# Patient Record
Sex: Male | Born: 1940 | Race: White | Hispanic: No | Marital: Married | Smoking: Never smoker
Health system: Southern US, Community
[De-identification: ages and names within clinical notes are randomized; demographics above are authoritative.]

## PROBLEM LIST (undated history)

## (undated) DIAGNOSIS — R42 Dizziness and giddiness: Secondary | ICD-10-CM

## (undated) DIAGNOSIS — E785 Hyperlipidemia, unspecified: Secondary | ICD-10-CM

## (undated) DIAGNOSIS — C801 Malignant (primary) neoplasm, unspecified: Secondary | ICD-10-CM

## (undated) DIAGNOSIS — R112 Nausea with vomiting, unspecified: Secondary | ICD-10-CM

## (undated) DIAGNOSIS — Z8489 Family history of other specified conditions: Secondary | ICD-10-CM

## (undated) DIAGNOSIS — Z9889 Other specified postprocedural states: Secondary | ICD-10-CM

## (undated) DIAGNOSIS — K649 Unspecified hemorrhoids: Secondary | ICD-10-CM

## (undated) DIAGNOSIS — E039 Hypothyroidism, unspecified: Secondary | ICD-10-CM

## (undated) DIAGNOSIS — E079 Disorder of thyroid, unspecified: Secondary | ICD-10-CM

## (undated) DIAGNOSIS — I10 Essential (primary) hypertension: Secondary | ICD-10-CM

## (undated) DIAGNOSIS — I2510 Atherosclerotic heart disease of native coronary artery without angina pectoris: Secondary | ICD-10-CM

## (undated) DIAGNOSIS — N189 Chronic kidney disease, unspecified: Secondary | ICD-10-CM

## (undated) DIAGNOSIS — K219 Gastro-esophageal reflux disease without esophagitis: Secondary | ICD-10-CM

## (undated) DIAGNOSIS — K5900 Constipation, unspecified: Secondary | ICD-10-CM

## (undated) DIAGNOSIS — K635 Polyp of colon: Secondary | ICD-10-CM

## (undated) DIAGNOSIS — I219 Acute myocardial infarction, unspecified: Secondary | ICD-10-CM

## (undated) HISTORY — PX: COLONOSCOPY: SHX174

## (undated) HISTORY — PX: OTHER SURGICAL HISTORY: SHX169

## (undated) HISTORY — DX: Acute myocardial infarction, unspecified: I21.9

## (undated) HISTORY — PX: UPPER GASTROINTESTINAL ENDOSCOPY: SHX188

## (undated) HISTORY — PX: HERNIA REPAIR: SHX51

## (undated) HISTORY — DX: Atherosclerotic heart disease of native coronary artery without angina pectoris: I25.10

## (undated) HISTORY — PX: APPENDECTOMY: SHX54

## (undated) HISTORY — DX: Hyperlipidemia, unspecified: E78.5

---

## 2006-05-20 ENCOUNTER — Ambulatory Visit: Payer: Self-pay | Admitting: Ophthalmology

## 2006-07-09 ENCOUNTER — Ambulatory Visit: Payer: Self-pay | Admitting: *Deleted

## 2006-08-14 ENCOUNTER — Ambulatory Visit: Payer: Self-pay | Admitting: Internal Medicine

## 2007-03-07 ENCOUNTER — Ambulatory Visit: Payer: Self-pay | Admitting: Unknown Physician Specialty

## 2009-11-15 ENCOUNTER — Ambulatory Visit: Payer: Self-pay | Admitting: Unknown Physician Specialty

## 2011-03-14 ENCOUNTER — Ambulatory Visit: Payer: Self-pay | Admitting: Urology

## 2011-06-01 ENCOUNTER — Other Ambulatory Visit: Payer: Self-pay | Admitting: Urology

## 2011-06-18 ENCOUNTER — Encounter (HOSPITAL_COMMUNITY): Payer: Self-pay | Admitting: Pharmacy Technician

## 2011-06-19 ENCOUNTER — Ambulatory Visit (HOSPITAL_COMMUNITY)
Admission: RE | Admit: 2011-06-19 | Discharge: 2011-06-19 | Disposition: A | Discharge status: Home / Self Care | Payer: Medicare Other | Source: Ambulatory Visit | Attending: Urology | Admitting: Urology

## 2011-06-19 ENCOUNTER — Encounter (HOSPITAL_COMMUNITY)
Admission: RE | Admit: 2011-06-19 | Discharge: 2011-06-19 | Disposition: A | Discharge status: Home / Self Care | Payer: Medicare Other | Source: Ambulatory Visit | Attending: Urology | Admitting: Urology

## 2011-06-19 ENCOUNTER — Other Ambulatory Visit: Payer: Self-pay

## 2011-06-19 ENCOUNTER — Encounter (HOSPITAL_COMMUNITY): Payer: Self-pay

## 2011-06-19 DIAGNOSIS — Z01818 Encounter for other preprocedural examination: Secondary | ICD-10-CM | POA: Insufficient documentation

## 2011-06-19 DIAGNOSIS — M47817 Spondylosis without myelopathy or radiculopathy, lumbosacral region: Secondary | ICD-10-CM | POA: Insufficient documentation

## 2011-06-19 DIAGNOSIS — Z01812 Encounter for preprocedural laboratory examination: Secondary | ICD-10-CM | POA: Insufficient documentation

## 2011-06-19 DIAGNOSIS — M47814 Spondylosis without myelopathy or radiculopathy, thoracic region: Secondary | ICD-10-CM | POA: Insufficient documentation

## 2011-06-19 DIAGNOSIS — E119 Type 2 diabetes mellitus without complications: Secondary | ICD-10-CM | POA: Insufficient documentation

## 2011-06-19 DIAGNOSIS — Z79899 Other long term (current) drug therapy: Secondary | ICD-10-CM | POA: Insufficient documentation

## 2011-06-19 DIAGNOSIS — I10 Essential (primary) hypertension: Secondary | ICD-10-CM | POA: Insufficient documentation

## 2011-06-19 DIAGNOSIS — C61 Malignant neoplasm of prostate: Secondary | ICD-10-CM | POA: Insufficient documentation

## 2011-06-19 HISTORY — DX: Essential (primary) hypertension: I10

## 2011-06-19 HISTORY — DX: Malignant (primary) neoplasm, unspecified: C80.1

## 2011-06-19 HISTORY — DX: Other specified postprocedural states: R11.2

## 2011-06-19 HISTORY — DX: Gastro-esophageal reflux disease without esophagitis: K21.9

## 2011-06-19 HISTORY — DX: Other specified postprocedural states: Z98.890

## 2011-06-19 LAB — BASIC METABOLIC PANEL
BUN: 21 mg/dL (ref 6–23)
CO2: 28 mEq/L (ref 19–32)
Chloride: 101 mEq/L (ref 96–112)
GFR calc non Af Amer: 48 mL/min — ABNORMAL LOW (ref >90)
Glucose, Bld: 211 mg/dL — ABNORMAL HIGH (ref 70–99)
Potassium: 4.6 mEq/L (ref 3.5–5.1)
Sodium: 134 mEq/L — ABNORMAL LOW (ref 135–145)

## 2011-06-19 LAB — CBC
HCT: 39.7 % (ref 39.0–52.0)
Hemoglobin: 13.6 g/dL (ref 13.0–17.0)
RBC: 4.52 MIL/uL (ref 4.22–5.81)
WBC: 6.5 10*3/uL (ref 4.0–10.5)

## 2011-06-19 LAB — SURGICAL PCR SCREEN
MRSA, PCR: NEGATIVE
Staphylococcus aureus: NEGATIVE

## 2011-06-19 NOTE — Patient Instructions (Addendum)
[facility]  06/19/2011   Your procedure is scheduled on:  06/25/11  Monday  Surgery 1100-1400  Report to [facility] at  0830     AM.  Call this number if you have problems the morning of surgery: [phone]     Or PST   [id number]  Karsyn Jamie             NO BLOOD SUGAR MEDS AM OF SURGERY  Remember:   Do not eat food:After Midnight.SATURDAY NIGHT AS PER OFFICE- BOWEL PREP as per office-    Increase fluids Sunday until midnight  May have clear liquids: CLEARS   Sunday AS PER OFFICE  Clear liquids include soda, tea, black coffee, apple or grape juice, broth.  Take these medicines the morning of surgery with A SIP OF WATER:   Prolisec with sip  water   Do not wear jewelry, make-up or nail polish.  Do not wear lotions, powders, or perfumes. You may wear deodorant.  Do not shave 48 hours prior to surgery.  Do not bring valuables to the hospital.  Contacts, dentures or bridgework may not be worn into surgery.  Leave suitcase in the car. After surgery it may be brought to your room.  For patients admitted to the hospital, checkout time is 11:00 AM the day of discharge.   Patients discharged the day of surgery will not be allowed to drive home.  Name and phone number of your driver:    Wife  Butch Penny                                                                  Special Instructions: CHG Shower Use Special Wash: 1/2 bottle night before surgery and 1/2 bottle morning of surgery. REGULAR SOAP FACE AND PRIVATES              L                MEN-MAY SHAVE FACE MORNING OF SURGERY  Please read over the following fact sheets that you were given: MRSA Information

## 2011-06-24 NOTE — H&P (Signed)
Chief Complaint  Prostate Cancer   Reason For Visit  Reason for visit: 2nd opinion about treatment options for prostate cancer PCP: Dr. [name]   History of Present Illness     Victor Gould is a 71 year old who was noted to have an elevated PSA of 6.66 which was found to still be elevated at 5.8 when rechecked which prompted a urologic evaluation by Dr. John Giovanni. He underwent a prostate biopsy on 03/01/11 which demonstrated Gleason 4+3=7 adenocarcinoma of the prostate with 9 out of 12 biopsy cores positive for malignancy. He underwent MR imaging of the pelvis on 03/14/11 which demonstrated no findings to suggest EPE, SVI, or LAD. His father was diagnosed with prostate cancer in his 93s and ultimately died of metastatic prostate cancer.  He has been thoroughly counseled about his options for management by Dr. Bernardo Heater and Dr. Jonette Eva.  He has also had a radiation oncology appointment with Dr. Ina Homes. He is very well informed about his treatment options and is here today for another opinion.  Notably, he underwent cystoscopy by Dr. Nevada Crane presumably for LUTS and he was noted to have a prominent median lobe.  While he was considering radiation therapy, concern regarding his LUTS and his median lobe caused him to reconsider his options for treatment.  TNM stage: cT2c Nx Mx PSA: 5.8 Gleason score: 4+3=7 Biopsy (03/01/11 - [facility], read by Dr. Elayne Guerin, Acc # [phone]): 9/12 cores     Left: L lateral apex (33%, 3+3=6), L apex (21%, 3+3=6), L mid (15%, 3+3=6), L lateral base (60%, 4+3=7)    Right: R lateral apex (71%, 3+3=6), R mid (30%, 3+3=6), R lateral mid (36%, 3+3=6), R base (5%, 3+3=6), R lateral base (42%, 3+3=6) Prostate volume: 31 cc  Nomogram: OC disease: 71% EPE: 23% SVI: 6% LNI: 2.4% PFS (surgery): 83%, 75%  Urinary function: He has moderate lower urinary tract symptoms including a sense of incomplete emptying, frequency, intermittency, urgency, weak stream, and  straining. IPSS: 17/4.  Erectile function:  He has severe erectile dysfunction which has not been responsive to PDE 5 inhibitors.   Past Medical History Problems  1. History of  Diabetes Mellitus 250.00 2. History of  Esophageal Reflux 530.81 3. History of  Hypertension 401.9 4. History of  Prostate Cancer V10.46  Surgical History Problems  1. History of  Appendectomy 2. History of  Hemorrhoidectomy 3. History of  Hernia Repair Bilateral   He has undergone bilateral open inguinal hernia repair.   Current Meds 1. Aspirin 81 MG Oral Tablet; Therapy: (Recorded:29Jan2013) to 2. Glimepiride 4 MG Oral Tablet; Therapy: 22Aug2012 to 3. Januvia 100 MG Oral Tablet; Therapy: 22Aug2012 to 4. Lisinopril 10 MG Oral Tablet; Therapy: 22Aug2012 to 5. MetFORMIN HCl 500 MG Oral Tablet; Therapy: 22Aug2012 to 6. Multivitamins TABS; Therapy: (Recorded:29Jan2013) to 7. Omeprazole 20 MG Oral Capsule Delayed Release; Therapy: 06Dec2011 to 8. Tamsulosin HCl 0.4 MG Oral Capsule; Therapy: [id number] to  Allergies Medication  1. Sulfa Drugs  Family History Problems  1. Family history of  Acute Myocardial Infarction V17.3 2. Family history of  Cancer 3. Family history of  Family Health Status Number Of Children 3 living children 4. Family history of  Heart Disease V17.49 5. Family history of  Prostate Cancer V16.42  Social History Problems    Marital History - Currently Married   Never A Smoker   Occupation: Retired Denied    History of  Alcohol Use  Review of Systems Constitutional, skin, eye,  otolaryngeal, hematologic/lymphatic, cardiovascular, pulmonary, endocrine, musculoskeletal, gastrointestinal, neurological and psychiatric system(s) were reviewed and pertinent findings if present are noted.  Gastrointestinal: heartburn.  Integumentary: skin rash/lesion and pruritus.  Neurological: dizziness.    Vitals Vital Signs [Data Includes: Last 1 Day]  29Jan2013 04:43PM  BMI Calculated:  24.34 BSA Calculated: 1.95 Height: 5 ft 10 in Weight: 170 lb  Blood Pressure: 131 / 76 Heart Rate: 85  Physical Exam Constitutional: Well nourished and well developed . No acute distress.  ENT:. The ears and nose are normal in appearance.  Neck: The appearance of the neck is normal and no neck mass is present.  Pulmonary: No respiratory distress, normal respiratory rhythm and effort and clear bilateral breath sounds.  Cardiovascular: Heart rate and rhythm are normal . No peripheral edema.  Abdomen: right lower quadrant, left inguinal, right inguinal incision site(s) well healed. The abdomen is soft and nontender. No masses are palpated. No CVA tenderness. No hernias are palpable. No hepatosplenomegaly noted.  Rectal: His prostate is estimated to be approximately 35 cc. He does have concern induration involving the right and left apices.  Lymphatics: The femoral and inguinal nodes are not enlarged or tender.  Skin: Normal skin turgor, no visible rash and no visible skin lesions.  Neuro/Psych:. Mood and affect are appropriate.    Results/Data  I have independently reviewed his medical records, pathology report, PSA results, and reviewed the report of his MRI of the prostate. Findings are as dictated above.     Assessment Assessed  1. Prostate Cancer 185  Plan Prostate Cancer (185)  1. Follow-up Office  Follow-up  Done: 29Jan2013 2. PT Referral Referral  Referral  Requested for: 29Jan2013  Discussion/Summary  1. Prostate cancer:   The patient was counseled about the natural history of prostate cancer and the standard treatment options that are available for prostate cancer. It was explained to him how his age and life expectancy, clinical stage, Gleason score, and PSA affect his prognosis, the decision to proceed with additional staging studies, as well as how that information influences recommended treatment strategies. We discussed the roles for active surveillance, radiation  therapy, surgical therapy, androgen deprivation, as well as ablative therapy options for the treatment of prostate cancer as appropriate to his individual cancer situation. We discussed the risks and benefits of these options with regard to their impact on cancer control and also in terms of potential adverse events, complications, and impact on quiality of life particularly related to urinary, bowel, and sexual function. The patient was encouraged to ask questions throughout the discussion today and all questions were answered to his stated satisfaction. In addition, the patient was provided with and/or directed to appropriate resources and literature for further education about prostate cancer and treatment options.   We discussed surgical therapy for prostate cancer including the different available surgical approaches. We discussed, in detail, the risks and expectations of surgery with regard to cancer control, urinary control, and erectile function as well as the expected postoperative recovery process. Additional risks of surgery including but not limited to bleeding, infection, hernia formation, nerve damage, lymphocele formation, bowel/rectal injury potentially necessitating colostomy, damage to the urinary tract resulting in urine leakage, urethral stricture, and the cardiopulmonary risks such as myocardial infarction, stroke, death, venothromboembolism, etc. were explained. The risk of open surgical conversion for robotic/laparoscopic prostatectomy was also discussed.   I had a long discussion with Victor Gould and his wife regarding his situation and his options for management. He definitely does not  wish to proceed with surveillance considering his concern about cancer progression and his own personal experience with his father's death related to metastatic prostate cancer. We then discussed options and specifically reviewed his lower urinary tract symptoms and median prostate lobe. We discussed  options including radical prostatectomy and the option of proceeding with a TUR of his median lobe subsequenty followed by radiation therapy to the prostate. He understands the increased risk of urinary incontinence associated with surgery considering his age which may be as high as 10% for long-term severe incontinence. He understands this risk and is very well-informed. He does wish to proceed with surgical treatment at this time. He will be scheduled for a non-nerve sparing robotic prostatectomy and pelvic lymphadenectomy.  Cc: Dr. [name]    SignaturesElectronically signed by : Raynelle Bring, M.D.; May 29 2011  8:27PM

## 2011-06-25 ENCOUNTER — Other Ambulatory Visit: Payer: Self-pay | Admitting: Urology

## 2011-06-25 ENCOUNTER — Inpatient Hospital Stay (HOSPITAL_COMMUNITY)
Admission: RE | Admit: 2011-06-25 | Discharge: 2011-06-26 | DRG: 708 | Disposition: A | Discharge status: Home / Self Care | Payer: Medicare Other | Source: Ambulatory Visit | Attending: Urology | Admitting: Urology

## 2011-06-25 ENCOUNTER — Inpatient Hospital Stay (HOSPITAL_COMMUNITY): Payer: Medicare Other | Admitting: Certified Registered Nurse Anesthetist

## 2011-06-25 ENCOUNTER — Encounter (HOSPITAL_COMMUNITY): Payer: Self-pay | Admitting: *Deleted

## 2011-06-25 ENCOUNTER — Encounter (HOSPITAL_COMMUNITY): Payer: Self-pay | Admitting: Certified Registered Nurse Anesthetist

## 2011-06-25 ENCOUNTER — Encounter (HOSPITAL_COMMUNITY)
Admission: RE | Disposition: A | Discharge status: Home / Self Care | Payer: Self-pay | Source: Ambulatory Visit | Attending: Urology

## 2011-06-25 DIAGNOSIS — C61 Malignant neoplasm of prostate: Principal | ICD-10-CM | POA: Diagnosis present

## 2011-06-25 DIAGNOSIS — E119 Type 2 diabetes mellitus without complications: Secondary | ICD-10-CM | POA: Diagnosis present

## 2011-06-25 DIAGNOSIS — I10 Essential (primary) hypertension: Secondary | ICD-10-CM | POA: Diagnosis present

## 2011-06-25 HISTORY — PX: ROBOT ASSISTED LAPAROSCOPIC RADICAL PROSTATECTOMY: SHX5141

## 2011-06-25 LAB — GLUCOSE, CAPILLARY
Glucose-Capillary: 247 mg/dL — ABNORMAL HIGH (ref 70–99)
Glucose-Capillary: 200 mg/dL — ABNORMAL HIGH (ref 70–99)
Glucose-Capillary: 249 mg/dL — ABNORMAL HIGH (ref 70–99)

## 2011-06-25 LAB — TYPE AND SCREEN: Antibody Screen: NEGATIVE

## 2011-06-25 LAB — HEMOGLOBIN AND HEMATOCRIT, BLOOD: HCT: 40 % (ref 39.0–52.0)

## 2011-06-25 SURGERY — ROBOTIC ASSISTED LAPAROSCOPIC RADICAL PROSTATECTOMY LEVEL 2
Anesthesia: General | Site: Pelvis | Wound class: Clean

## 2011-06-25 MED ORDER — BUPIVACAINE-EPINEPHRINE PF 0.25-1:200000 % IJ SOLN
INTRAMUSCULAR | Status: AC
  Filled 2011-06-25: qty 30

## 2011-06-25 MED ORDER — ROCURONIUM BROMIDE 100 MG/10ML IV SOLN
INTRAVENOUS | Status: DC | PRN
  Administered 2011-06-25: 50 mg via INTRAVENOUS
  Administered 2011-06-25: 20 mg via INTRAVENOUS

## 2011-06-25 MED ORDER — BUPIVACAINE-EPINEPHRINE 0.25% -1:200000 IJ SOLN
INTRAMUSCULAR | Status: DC | PRN
  Administered 2011-06-25: 26 mL

## 2011-06-25 MED ORDER — ACETAMINOPHEN 10 MG/ML IV SOLN
INTRAVENOUS | Status: DC | PRN
  Administered 2011-06-25: 1000 mg via INTRAVENOUS

## 2011-06-25 MED ORDER — HYDROMORPHONE HCL PF 1 MG/ML IJ SOLN
INTRAMUSCULAR | Status: AC
  Filled 2011-06-25: qty 1

## 2011-06-25 MED ORDER — KCL IN DEXTROSE-NACL 20-5-0.45 MEQ/L-%-% IV SOLN
INTRAVENOUS | Status: DC
  Administered 2011-06-25: 21:00:00 via INTRAVENOUS
  Administered 2011-06-25: 1000 mL via INTRAVENOUS
  Filled 2011-06-25 (×5): qty 1000

## 2011-06-25 MED ORDER — PANTOPRAZOLE SODIUM 40 MG PO TBEC
40.0000 mg | DELAYED_RELEASE_TABLET | Freq: Every day | ORAL | Status: DC
  Administered 2011-06-26: 40 mg via ORAL
  Filled 2011-06-25: qty 1

## 2011-06-25 MED ORDER — SODIUM CHLORIDE 0.9 % IR SOLN
Status: DC | PRN
  Administered 2011-06-25: 1000 mL

## 2011-06-25 MED ORDER — LIDOCAINE HCL (CARDIAC) 20 MG/ML IV SOLN
INTRAVENOUS | Status: DC | PRN
  Administered 2011-06-25: 100 mg via INTRAVENOUS

## 2011-06-25 MED ORDER — HEPARIN SODIUM (PORCINE) 1000 UNIT/ML IJ SOLN
INTRAMUSCULAR | Status: AC
  Filled 2011-06-25: qty 1

## 2011-06-25 MED ORDER — FENTANYL CITRATE 0.05 MG/ML IJ SOLN
INTRAMUSCULAR | Status: DC | PRN
  Administered 2011-06-25 (×2): 50 ug via INTRAVENOUS
  Administered 2011-06-25: 100 ug via INTRAVENOUS
  Administered 2011-06-25 (×3): 50 ug via INTRAVENOUS

## 2011-06-25 MED ORDER — INSULIN ASPART 100 UNIT/ML ~~LOC~~ SOLN
SUBCUTANEOUS | Status: AC
  Filled 2011-06-25: qty 1

## 2011-06-25 MED ORDER — NEOSTIGMINE METHYLSULFATE 1 MG/ML IJ SOLN
INTRAMUSCULAR | Status: DC | PRN
  Administered 2011-06-25: 5 mg via INTRAVENOUS

## 2011-06-25 MED ORDER — INSULIN ASPART 100 UNIT/ML ~~LOC~~ SOLN
0.0000 [IU] | SUBCUTANEOUS | Status: DC
  Administered 2011-06-25: 5 [IU] via SUBCUTANEOUS
  Administered 2011-06-25: 3 [IU] via SUBCUTANEOUS

## 2011-06-25 MED ORDER — HYDROCODONE-ACETAMINOPHEN 5-325 MG PO TABS: 1.0000 | ORAL_TABLET | Freq: Four times a day (QID) | ORAL | Status: AC | PRN

## 2011-06-25 MED ORDER — INDIGOTINDISULFONATE SODIUM 8 MG/ML IJ SOLN
INTRAMUSCULAR | Status: DC | PRN
  Administered 2011-06-25 (×2): 5 mL via INTRAVENOUS

## 2011-06-25 MED ORDER — INSULIN ASPART 100 UNIT/ML ~~LOC~~ SOLN: 0.0000 [IU] | SUBCUTANEOUS | Status: DC

## 2011-06-25 MED ORDER — LACTATED RINGERS IV SOLN
INTRAVENOUS | Status: DC | PRN
  Administered 2011-06-25: 11:00:00

## 2011-06-25 MED ORDER — MIDAZOLAM HCL 5 MG/5ML IJ SOLN
INTRAMUSCULAR | Status: DC | PRN
  Administered 2011-06-25: 2 mg via INTRAVENOUS

## 2011-06-25 MED ORDER — PROMETHAZINE HCL 25 MG/ML IJ SOLN: 6.2500 mg | INTRAMUSCULAR | Status: DC | PRN

## 2011-06-25 MED ORDER — CIPROFLOXACIN HCL 500 MG PO TABS: 500.0000 mg | ORAL_TABLET | Freq: Two times a day (BID) | ORAL | Status: AC

## 2011-06-25 MED ORDER — KETOROLAC TROMETHAMINE 15 MG/ML IJ SOLN
15.0000 mg | Freq: Four times a day (QID) | INTRAMUSCULAR | Status: DC
  Administered 2011-06-25 – 2011-06-26 (×5): 15 mg via INTRAVENOUS
  Filled 2011-06-25 (×5): qty 1

## 2011-06-25 MED ORDER — SUCCINYLCHOLINE CHLORIDE 20 MG/ML IJ SOLN
INTRAMUSCULAR | Status: DC | PRN
  Administered 2011-06-25: 100 mg via INTRAVENOUS

## 2011-06-25 MED ORDER — KETOROLAC TROMETHAMINE 15 MG/ML IJ SOLN
INTRAMUSCULAR | Status: AC
  Filled 2011-06-25: qty 1

## 2011-06-25 MED ORDER — DROPERIDOL 2.5 MG/ML IJ SOLN
INTRAMUSCULAR | Status: DC | PRN
  Administered 2011-06-25: 0.625 mg via INTRAVENOUS

## 2011-06-25 MED ORDER — DIPHENHYDRAMINE HCL 50 MG/ML IJ SOLN: 12.5000 mg | Freq: Four times a day (QID) | INTRAMUSCULAR | Status: DC | PRN

## 2011-06-25 MED ORDER — CEFAZOLIN SODIUM 1-5 GM-% IV SOLN
1.0000 g | Freq: Three times a day (TID) | INTRAVENOUS | Status: AC
  Administered 2011-06-25 – 2011-06-26 (×2): 1 g via INTRAVENOUS
  Filled 2011-06-25 (×3): qty 50

## 2011-06-25 MED ORDER — INSULIN ASPART 100 UNIT/ML ~~LOC~~ SOLN
0.0000 [IU] | SUBCUTANEOUS | Status: DC
  Administered 2011-06-25: 8 [IU] via SUBCUTANEOUS
  Administered 2011-06-25: 5 [IU] via SUBCUTANEOUS
  Administered 2011-06-26 (×2): 8 [IU] via SUBCUTANEOUS
  Administered 2011-06-26: 11 [IU] via SUBCUTANEOUS
  Administered 2011-06-26: 8 [IU] via SUBCUTANEOUS
  Filled 2011-06-25 (×2): qty 3

## 2011-06-25 MED ORDER — EPHEDRINE SULFATE 50 MG/ML IJ SOLN
INTRAMUSCULAR | Status: DC | PRN
  Administered 2011-06-25: 10 mg via INTRAVENOUS

## 2011-06-25 MED ORDER — CEFAZOLIN SODIUM 1-5 GM-% IV SOLN
1.0000 g | INTRAVENOUS | Status: AC
  Administered 2011-06-25: 1 g via INTRAVENOUS

## 2011-06-25 MED ORDER — SODIUM CHLORIDE 0.9 % IV BOLUS (SEPSIS)
1000.0000 mL | Freq: Once | INTRAVENOUS | Status: AC
  Administered 2011-06-25: 1000 mL via INTRAVENOUS

## 2011-06-25 MED ORDER — DOCUSATE SODIUM 100 MG PO CAPS
100.0000 mg | ORAL_CAPSULE | Freq: Two times a day (BID) | ORAL | Status: DC
  Administered 2011-06-25 – 2011-06-26 (×2): 100 mg via ORAL
  Filled 2011-06-25 (×3): qty 1

## 2011-06-25 MED ORDER — HYDROMORPHONE HCL PF 1 MG/ML IJ SOLN
INTRAMUSCULAR | Status: DC | PRN
  Administered 2011-06-25 (×2): 0.5 mg via INTRAVENOUS

## 2011-06-25 MED ORDER — INDIGOTINDISULFONATE SODIUM 8 MG/ML IJ SOLN
INTRAMUSCULAR | Status: AC
  Filled 2011-06-25: qty 10

## 2011-06-25 MED ORDER — HYDROMORPHONE HCL PF 1 MG/ML IJ SOLN
0.2500 mg | INTRAMUSCULAR | Status: DC | PRN
Start: 2011-06-25 — End: 2011-06-25
  Administered 2011-06-25 (×2): 0.5 mg via INTRAVENOUS

## 2011-06-25 MED ORDER — KCL IN DEXTROSE-NACL 20-5-0.45 MEQ/L-%-% IV SOLN
INTRAVENOUS | Status: AC
  Filled 2011-06-25: qty 1000

## 2011-06-25 MED ORDER — SCOPOLAMINE 1 MG/3DAYS TD PT72
MEDICATED_PATCH | TRANSDERMAL | Status: DC | PRN
  Administered 2011-06-25: 1 via TRANSDERMAL

## 2011-06-25 MED ORDER — DIPHENHYDRAMINE HCL 12.5 MG/5ML PO ELIX: 12.5000 mg | ORAL_SOLUTION | Freq: Four times a day (QID) | ORAL | Status: DC | PRN

## 2011-06-25 MED ORDER — PROPOFOL 10 MG/ML IV EMUL
INTRAVENOUS | Status: DC | PRN
  Administered 2011-06-25: 150 mg via INTRAVENOUS

## 2011-06-25 MED ORDER — GLYCOPYRROLATE 0.2 MG/ML IJ SOLN
INTRAMUSCULAR | Status: DC | PRN
  Administered 2011-06-25: .8 mg via INTRAVENOUS

## 2011-06-25 MED ORDER — ACETAMINOPHEN 325 MG PO TABS: 650.0000 mg | ORAL_TABLET | ORAL | Status: DC | PRN

## 2011-06-25 MED ORDER — MORPHINE SULFATE 2 MG/ML IJ SOLN: 2.0000 mg | INTRAMUSCULAR | Status: DC | PRN

## 2011-06-25 MED ORDER — LACTATED RINGERS IV SOLN
INTRAVENOUS | Status: DC
  Administered 2011-06-25: 1000 mL via INTRAVENOUS

## 2011-06-25 MED ORDER — SCOPOLAMINE 1 MG/3DAYS TD PT72
MEDICATED_PATCH | TRANSDERMAL | Status: AC
  Filled 2011-06-25: qty 1

## 2011-06-25 MED ORDER — ONDANSETRON HCL 4 MG/2ML IJ SOLN
INTRAMUSCULAR | Status: DC | PRN
  Administered 2011-06-25: 4 mg via INTRAVENOUS

## 2011-06-25 SURGICAL SUPPLY — 35 items
CANISTER SUCTION 2500CC (MISCELLANEOUS) ×3 IMPLANT
CATH ROBINSON RED A/P 8FR (CATHETERS) ×3 IMPLANT
CHLORAPREP W/TINT 26ML (MISCELLANEOUS) ×3 IMPLANT
CLIP LIGATING HEM O LOK PURPLE (MISCELLANEOUS) ×6 IMPLANT
CLOTH BEACON ORANGE TIMEOUT ST (SAFETY) ×3 IMPLANT
CORD HIGH FREQUENCY UNIPOLAR (ELECTROSURGICAL) ×3 IMPLANT
COVER SURGICAL LIGHT HANDLE (MISCELLANEOUS) ×3 IMPLANT
COVER TIP SHEARS 8 DVNC (MISCELLANEOUS) ×2 IMPLANT
COVER TIP SHEARS 8MM DA VINCI (MISCELLANEOUS) ×1
CUTTER ECHEON FLEX ENDO 45 340 (ENDOMECHANICALS) ×3 IMPLANT
DECANTER SPIKE VIAL GLASS SM (MISCELLANEOUS) ×3 IMPLANT
DRAPE SURG IRRIG POUCH 19X23 (DRAPES) ×3 IMPLANT
DRSG TEGADERM 6X8 (GAUZE/BANDAGES/DRESSINGS) ×6 IMPLANT
ELECT REM PT RETURN 9FT ADLT (ELECTROSURGICAL) ×3
ELECTRODE REM PT RTRN 9FT ADLT (ELECTROSURGICAL) ×2 IMPLANT
GLOVE BIO SURGEON STRL SZ 6.5 (GLOVE) ×3 IMPLANT
GLOVE BIOGEL M STRL SZ7.5 (GLOVE) ×6 IMPLANT
GOWN STRL NON-REIN LRG LVL3 (GOWN DISPOSABLE) ×9 IMPLANT
GOWN STRL REIN XL XLG (GOWN DISPOSABLE) ×6 IMPLANT
HOLDER FOLEY CATH W/STRAP (MISCELLANEOUS) ×3 IMPLANT
IV LACTATED RINGERS 1000ML (IV SOLUTION) ×3 IMPLANT
KIT ACCESSORY DA VINCI DISP (KITS) ×1
KIT ACCESSORY DVNC DISP (KITS) ×2 IMPLANT
NDL SAFETY ECLIPSE 18X1.5 (NEEDLE) ×2 IMPLANT
NEEDLE HYPO 18GX1.5 SHARP (NEEDLE) ×1
PACK ROBOT UROLOGY CUSTOM (CUSTOM PROCEDURE TRAY) ×3 IMPLANT
RELOAD GREEN ECHELON 45 (STAPLE) ×3 IMPLANT
SET TUBE IRRIG SUCTION NO TIP (IRRIGATION / IRRIGATOR) ×3 IMPLANT
SOLUTION ELECTROLUBE (MISCELLANEOUS) ×3 IMPLANT
SPONGE GAUZE 4X4 12PLY (GAUZE/BANDAGES/DRESSINGS) ×3 IMPLANT
SUT VICRYL 0 UR6 27IN ABS (SUTURE) ×6 IMPLANT
SYR 27GX1/2 1ML LL SAFETY (SYRINGE) ×3 IMPLANT
TOWEL OR 17X26 10 PK STRL BLUE (TOWEL DISPOSABLE) ×3 IMPLANT
TOWEL OR NON WOVEN STRL DISP B (DISPOSABLE) ×3 IMPLANT
WATER STERILE IRR 1500ML POUR (IV SOLUTION) ×6 IMPLANT

## 2011-06-25 NOTE — Anesthesia Preprocedure Evaluation (Signed)
Anesthesia Evaluation  Patient identified by MRN, date of birth, ID band Patient awake    Reviewed: Allergy & Precautions, H&P , NPO status , Patient's Chart, lab work & pertinent test results, reviewed documented beta blocker date and time   History of Anesthesia Complications (+) PONV  Airway Mallampati: II TM Distance: >3 FB Neck ROM: Full    Dental  (+) Teeth Intact and Dental Advisory Given   Pulmonary neg pulmonary ROS,  clear to auscultation        Cardiovascular hypertension, Pt. on medications Regular Normal Denies cardiac symptoms   Neuro/Psych Negative Neurological ROS  Negative Psych ROS   GI/Hepatic negative GI ROS, Neg liver ROS,   Endo/Other  Diabetes mellitus-, Type 2, Oral Hypoglycemic AgentsSSI peri-op  Renal/GU Cr 1.41   Prostate cancer    Musculoskeletal negative musculoskeletal ROS (+)   Abdominal   Peds negative pediatric ROS (+)  Hematology negative hematology ROS (+)   Anesthesia Other Findings Upper rt incisor cap  Reproductive/Obstetrics negative OB ROS                           Anesthesia Physical Anesthesia Plan  ASA: III  Anesthesia Plan: General   Post-op Pain Management:    Induction: Intravenous  Airway Management Planned: Oral ETT  Additional Equipment:   Intra-op Plan:   Post-operative Plan: Extubation in OR  Informed Consent: I have reviewed the patients History and Physical, chart, labs and discussed the procedure including the risks, benefits and alternatives for the proposed anesthesia with the patient or authorized representative who has indicated his/her understanding and acceptance.   Dental advisory given  Plan Discussed with: CRNA and Surgeon  Anesthesia Plan Comments:         Anesthesia Quick Evaluation

## 2011-06-25 NOTE — Op Note (Signed)
Preoperative diagnosis: Clinically localized adenocarcinoma of the prostate (clinical stage cT2c N0 Mx)  Postoperative diagnosis: Clinically localized adenocarcinoma of the prostate (clinical stage cT2c N0 Mx)  Procedure:  1. Robotic assisted laparoscopic radical prostatectomy (non nerve sparing) 2. Bilateral robotic assisted laparoscopic pelvic lymphadenectomy  Surgeon: Pryor Curia. M.D.  Assistant(s): Leta Baptist, PA-C  Anesthesia: General  Complications: None  EBL: 50 mL  IVF:  1400 mL crystalloid  Specimens: 1. Prostate and seminal vesicles 2. Right pelvic lymph nodes 3. Left pelvic lymph nodes  Disposition of specimens: Pathology  Drains: 1. 20 Fr coude catheter 2. # 19 Blake pelvic drain  Indication: Victor Gould is a 71 y.o. year old patient with clinically localized prostate cancer.  After a thorough review of the management options for treatment of prostate cancer, he elected to proceed with surgical therapy and the above procedure(s).  We have discussed the potential benefits and risks of the procedure, side effects of the proposed treatment, the likelihood of the patient achieving the goals of the procedure, and any potential problems that might occur during the procedure or recuperation. Informed consent has been obtained.  Description of procedure:  The patient was taken to the operating room and a general anesthetic was administered. He was given preoperative antibiotics, placed in the dorsal lithotomy position, and prepped and draped in the usual sterile fashion. Next a preoperative timeout was performed. A urethral catheter was placed into the bladder and a site was selected near the umbilicus for placement of the camera port. This was placed using a standard open Hassan technique which allowed entry into the peritoneal cavity under direct vision and without difficulty. A 12 mm port was placed and a pneumoperitoneum established. The camera was then  used to inspect the abdomen and there was no evidence of any intra-abdominal injuries or other abnormalities. The remaining abdominal ports were then placed. 8 mm robotic ports were placed in the right lower quadrant, left lower quadrant, and far left lateral abdominal wall. A 5 mm port was placed in the right upper quadrant and a 12 mm port was placed in the right lateral abdominal wall for laparoscopic assistance. All ports were placed under direct vision without difficulty. The surgical cart was then docked.   Utilizing the cautery scissors, the bladder was reflected posteriorly allowing entry into the space of Retzius and identification of the endopelvic fascia and prostate. The periprostatic fat was then removed from the prostate allowing full exposure of the endopelvic fascia. The endopelvic fascia was then incised from the apex back to the base of the prostate bilaterally and the underlying levator muscle fibers were swept laterally off the prostate thereby isolating the dorsal venous complex. The dorsal vein was then stapled and divided with a 45 mm Flex Echelon stapler. Attention then turned to the bladder neck which was divided anteriorly thereby allowing entry into the bladder and exposure of the urethral catheter. The catheter balloon was deflated and the catheter was brought into the operative field and used to retract the prostate anteriorly. The posterior bladder neck was then examined and was divided allowing further dissection between the bladder and prostate posteriorly until the vasa deferentia and seminal vessels were identified. The vasa deferentia were isolated, divided, and lifted anteriorly. The seminal vesicles were dissected down to their tips with care to control the seminal vascular arterial blood supply. These structures were then lifted anteriorly and the space between Denonvillier's fascia and the anterior rectum was developed with a combination of  sharp and blunt dissection. This  isolated the vascular pedicles of the prostate.  A wide non nerve sparing dissection was performed with Weck clips used to ligate the vascular pedicles of the prostate bilaterally. The vascular pedicles of the prostate were then divided.  The urethra was then sharply transected allowing the prostate specimen to be disarticulated. The pelvis was copiously irrigated and hemostasis was ensured. There was no evidence for rectal injury.  Attention then turned to the right pelvic sidewall. The fibrofatty tissue between the external iliac vein, confluence of the iliac vessels, hypogastric artery, and Cooper's ligament was dissected free from the pelvic sidewall with care to preserve the obturator nerve. Weck clips were used for lymphostasis and hemostasis. An identical procedure was performed on the contralateral side and the lymphatic packets were removed for permanent pathologic analysis.  Attention then turned to the urethral anastomosis. A 2-0 Vicryl slip knot was placed between Denonvillier's fascia, the posterior bladder neck, and the posterior urethra to reapproximate these structures. A double-armed 3-0 Monocryl suture was then used to perform a 360 running tension-free anastomosis between the bladder neck and urethra. A new urethral catheter was then placed into the bladder and irrigated. There were no blood clots within the bladder and the anastomosis appeared to be watertight. A #19 Blake drain was then brought through the left lateral 8 mm port site and positioned appropriately within the pelvis. It was secured to the skin with a nylon suture. The surgical cart was then undocked. The right lateral 12 mm port site was closed at the fascial level with a 0 Vicryl suture placed laparoscopically. All remaining ports were then removed under direct vision. The prostate specimen was removed intact within the Endopouch retrieval bag via the periumbilical camera port site. This fascial opening was closed with  two running 0 Vicryl sutures. 0.25% Marcaine was then injected into all port sites and all incisions were reapproximated at the skin level with staples. Sterile dressings were applied. The patient appeared to tolerate the procedure well and without complications. The patient was able to be extubated and transferred to the recovery unit in satisfactory condition.  Pryor Curia MD

## 2011-06-25 NOTE — Progress Notes (Signed)
Ambulated patient ~40 feet.  Pt. Felt slightly nauseous and dizzy once he got up.  Patient tolerated fair.  Encouraged patient to ambulate again tonight before going to bed and to try and walk a little farther each time he ambulates as long as he isn't feeling nauseous and dizzy.  Patient agreed. Jalayla Chrismer, Dudley Major

## 2011-06-25 NOTE — Anesthesia Postprocedure Evaluation (Signed)
  Anesthesia Post-op Note  Patient: Victor Gould  Procedure(s) Performed: Procedure(s) (LRB): ROBOTIC ASSISTED LAPAROSCOPIC RADICAL PROSTATECTOMY LEVEL 2 (N/A) LYMPHADENECTOMY (Bilateral)  Patient Location: PACU  Anesthesia Type: General  Level of Consciousness: oriented and sedated  Airway and Oxygen Therapy: Patient Spontanous Breathing and Patient connected to nasal cannula oxygen  Post-op Pain: mild  Post-op Assessment: Post-op Vital signs reviewed, Patient's Cardiovascular Status Stable, Respiratory Function Stable and Patent Airway  Post-op Vital Signs: stable  Complications: No apparent anesthesia complications

## 2011-06-25 NOTE — Transfer of Care (Signed)
Immediate Anesthesia Transfer of Care Note  Patient: Victor Gould  Procedure(s) Performed: Procedure(s) (LRB): ROBOTIC ASSISTED LAPAROSCOPIC RADICAL PROSTATECTOMY LEVEL 2 (N/A) LYMPHADENECTOMY (Bilateral)  Patient Location: PACU  Anesthesia Type: General  Level of Consciousness: sedated, patient cooperative and responds to stimulaton  Airway & Oxygen Therapy: Patient Spontanous Breathing and Patient connected to face mask oxgen  Post-op Assessment: Report given to PACU RN and Post -op Vital signs reviewed and stable  Post vital signs: Reviewed and stable  Complications: No apparent anesthesia complications

## 2011-06-25 NOTE — Interval H&P Note (Signed)
History and Physical Interval Note:  06/25/2011 9:43 AM  Victor Gould  has presented today for surgery, with the diagnosis of Prostate Cancer  The various methods of treatment have been discussed with the patient and family. After consideration of risks, benefits and other options for treatment, the patient has consented to  Procedure(s) (LRB): ROBOTIC ASSISTED LAPAROSCOPIC RADICAL PROSTATECTOMY LEVEL 2 (N/A) LYMPHADENECTOMY (Bilateral) as a surgical intervention .  The patients' history has been reviewed, patient examined, no change in status, stable for surgery.  Questions were answered to the patient's satisfaction.     Nickia Boesen,LES

## 2011-06-26 LAB — GLUCOSE, CAPILLARY
Glucose-Capillary: 263 mg/dL — ABNORMAL HIGH (ref 70–99)
Glucose-Capillary: 262 mg/dL — ABNORMAL HIGH (ref 70–99)

## 2011-06-26 LAB — HEMOGLOBIN AND HEMATOCRIT, BLOOD
HCT: 36.8 % — ABNORMAL LOW (ref 39.0–52.0)
Hemoglobin: 12.9 g/dL — ABNORMAL LOW (ref 13.0–17.0)

## 2011-06-26 MED ORDER — HYDROCODONE-ACETAMINOPHEN 5-325 MG PO TABS
1.0000 | ORAL_TABLET | Freq: Four times a day (QID) | ORAL | Status: DC | PRN
  Administered 2011-06-26: 2 via ORAL
  Filled 2011-06-26: qty 2

## 2011-06-26 MED ORDER — BISACODYL 10 MG RE SUPP
10.0000 mg | Freq: Once | RECTAL | Status: AC
  Administered 2011-06-26: 10 mg via RECTAL
  Filled 2011-06-26: qty 1

## 2011-06-26 MED ORDER — MENTHOL 3 MG MT LOZG
1.0000 | LOZENGE | OROMUCOSAL | Status: DC | PRN
  Administered 2011-06-26: 3 mg via ORAL
  Filled 2011-06-26: qty 9

## 2011-06-26 NOTE — Progress Notes (Signed)
Patient ID: Victor Gould, male   DOB: 1940/08/17, 71 y.o.   MRN: [id number]  1 Day Post-Op Subjective: The patient is doing well.  No nausea or vomiting. Pain is adequately controlled.  Objective: Vital signs in last 24 hours: Temp:  [97.2 F (36.2 C)-99.1 F (37.3 C)] 98.8 F (37.1 C) (02/26 0459) Pulse Rate:  [81-108] 94  (02/26 0459) Resp:  [12-19] 16  (02/26 0459) BP: (144-182)/(71-92) 157/77 mmHg (02/26 0459) SpO2:  [95 %-100 %] 95 % (02/26 0459) Weight:  [77.248 kg (170 lb 4.8 oz)] 77.248 kg (170 lb 4.8 oz) (02/25 1630)  Intake/Output from previous day: 02/25 0701 - 02/26 0700 In: 5475 [I.V.:4375; IV Piggyback:1100] Out: [id number] [Urine:1600; Drains:135; Blood:50] Intake/Output this shift:    Physical Exam:  General: Alert and oriented. CV: RRR Lungs: Clear bilaterally. GI: Soft, Nondistended. Incisions: Dressings intact. Urine: Clear Extremities: Nontender, no erythema, no edema.  Lab Results:  Basename 06/26/11 0429 06/25/11 1321  HGB 12.9* 14.0  HCT 36.8* 40.0      Assessment/Plan: POD# 1 s/p robotic prostatectomy.  1) SL IVF 2) Ambulate, Incentive spirometry 3) Transition to oral pain medication 4) Dulcolax suppository 5) D/C pelvic drain 6) Plan for likely discharge later today   Pryor Curia. MD   LOS: 1 day   Victor Gould,Victor Gould 06/26/2011, 7:48 AM

## 2011-06-26 NOTE — Discharge Summary (Signed)
  Date of admission: 06/25/2011  Date of discharge: 06/26/2011  Admission diagnosis: Prostate Cancer  Discharge diagnosis: Prostate Cancer  History and Physical: For full details, please see admission history and physical. Briefly, Victor Gould is a 71 y.o. gentleman with localized prostate cancer.  After discussing management/treatment options, he elected to proceed with surgical treatment.  Hospital Course: Victor Gould was taken to the operating room on 06/25/2011 and underwent a robotic assisted laparoscopic radical prostatectomy. He tolerated this procedure well and without complications. Postoperatively, he was able to be transferred to a regular hospital room following recovery from anesthesia.  He was able to begin ambulating the night of surgery. He remained hemodynamically stable overnight.  He had excellent urine output with appropriately minimal output from his pelvic drain and his pelvic drain was removed on POD #1.  He was transitioned to oral pain medication, tolerated a clear liquid diet, and had met all discharge criteria and was able to be discharged home later on POD#1.  Laboratory values:  Basename 06/26/11 0429 06/25/11 1321  HGB 12.9* 14.0  HCT 36.8* 40.0    Disposition: Home  Discharge instruction: He was instructed to be ambulatory but to refrain from heavy lifting, strenuous activity, or driving. He was instructed on urethral catheter care.  Discharge medications:   Medication List  As of 06/26/2011  1:51 PM   START taking these medications         ciprofloxacin 500 MG tablet   Commonly known as: CIPRO   Take 1 tablet (500 mg total) by mouth 2 (two) times daily. Start day prior to office visit for foley removal      HYDROcodone-acetaminophen 5-325 MG per tablet   Commonly known as: NORCO   Take 1-2 tablets by mouth every 6 (six) hours as needed for pain.         CONTINUE taking these medications         glimepiride 4 MG tablet   Commonly known as:  AMARYL      lisinopril 10 MG tablet   Commonly known as: PRINIVIL,ZESTRIL      metFORMIN 500 MG tablet   Commonly known as: GLUCOPHAGE      omeprazole 20 MG capsule   Commonly known as: PRILOSEC      sitaGLIPtin 100 MG tablet   Commonly known as: JANUVIA         STOP taking these medications         aspirin EC 81 MG tablet      mulitivitamin with minerals Tabs          Where to get your medications    These are the prescriptions that you need to pick up.   You may get these medications from any pharmacy.         ciprofloxacin 500 MG tablet   HYDROcodone-acetaminophen 5-325 MG per tablet            Followup: He will followup in 1 week for catheter removal and to discuss his surgical pathology results.

## 2011-06-26 NOTE — Progress Notes (Signed)
Inpatient Diabetes Program Recommendations  AACE/ADA: New Consensus Statement on Inpatient Glycemic Control (2009)  Target Ranges:  Prepandial:   less than 140 mg/dL      Peak postprandial:   less than 180 mg/dL (1-2 hours)      Critically ill patients:  140 - 180 mg/dL   Reason for Visit: Elevated glucose:  Results for COBEE, MIAO (MRN [id number]) as of 06/26/2011 09:24  Ref. Range 06/26/2011 00:04 06/26/2011 04:11 06/26/2011 07:39  Glucose-Capillary Latest Range: 70-99 mg/dL 294 (H) 342 (H) 263 (H)    Inpatient Diabetes Program Recommendations Insulin - Basal: Add Lantus 15 units daily Correction (SSI): Increase to Resistant Novolog Correction HgbA1C: Check HgbA1C to assess glycemic control

## 2011-06-28 ENCOUNTER — Encounter (HOSPITAL_COMMUNITY): Payer: Self-pay | Admitting: Urology

## 2011-12-06 LAB — CBC
HCT: 38.5 % — ABNORMAL LOW (ref 40.0–52.0)
MCH: 31.3 pg (ref 26.0–34.0)
MCHC: 34.9 g/dL (ref 32.0–36.0)
MCV: 90 fL (ref 80–100)
RDW: 12.8 % (ref 11.5–14.5)

## 2011-12-06 LAB — BASIC METABOLIC PANEL
BUN: 21 mg/dL — ABNORMAL HIGH (ref 7–18)
Co2: 25 mmol/L (ref 21–32)
Creatinine: 1.5 mg/dL — ABNORMAL HIGH (ref 0.60–1.30)
EGFR (Non-African Amer.): 46 — ABNORMAL LOW
Glucose: 381 mg/dL — ABNORMAL HIGH (ref 65–99)
Sodium: 136 mmol/L (ref 136–145)

## 2011-12-06 LAB — TROPONIN I: Troponin-I: <0.02 ng/mL

## 2011-12-07 ENCOUNTER — Inpatient Hospital Stay: Payer: Self-pay

## 2011-12-07 LAB — TROPONIN I
Troponin-I: <0.02 ng/mL
Troponin-I: <0.02 ng/mL

## 2011-12-08 LAB — BASIC METABOLIC PANEL
Anion Gap: 9 (ref 7–16)
Calcium, Total: 8.9 mg/dL (ref 8.5–10.1)
Creatinine: 1.4 mg/dL — ABNORMAL HIGH (ref 0.60–1.30)
EGFR (African American): 57 — ABNORMAL LOW
EGFR (Non-African Amer.): 49 — ABNORMAL LOW
Glucose: 250 mg/dL — ABNORMAL HIGH (ref 65–99)
Osmolality: 288 (ref 275–301)
Potassium: 3.9 mmol/L (ref 3.5–5.1)

## 2011-12-08 LAB — CBC WITH DIFFERENTIAL/PLATELET
Basophil #: 0 10*3/uL (ref 0.0–0.1)
Eosinophil #: 0 10*3/uL (ref 0.0–0.7)
HGB: 13.2 g/dL (ref 13.0–18.0)
Lymphocyte #: 1.5 10*3/uL (ref 1.0–3.6)
Lymphocyte %: 19.8 %
MCH: 31 pg (ref 26.0–34.0)
MCHC: 35.3 g/dL (ref 32.0–36.0)
MCV: 88 fL (ref 80–100)
Monocyte #: 0.2 x10 3/mm (ref 0.2–1.0)
Neutrophil #: 6 10*3/uL (ref 1.4–6.5)
RBC: 4.26 10*6/uL — ABNORMAL LOW (ref 4.40–5.90)
RDW: 13.1 % (ref 11.5–14.5)

## 2011-12-14 ENCOUNTER — Ambulatory Visit: Payer: Self-pay

## 2011-12-30 ENCOUNTER — Ambulatory Visit: Payer: Self-pay

## 2012-01-29 ENCOUNTER — Ambulatory Visit: Payer: Self-pay

## 2013-08-12 ENCOUNTER — Other Ambulatory Visit: Payer: Self-pay | Admitting: Urology

## 2013-08-19 ENCOUNTER — Other Ambulatory Visit: Payer: Self-pay | Admitting: Urology

## 2013-09-15 ENCOUNTER — Encounter (HOSPITAL_COMMUNITY): Payer: Self-pay | Admitting: Pharmacy Technician

## 2013-09-17 ENCOUNTER — Other Ambulatory Visit (HOSPITAL_COMMUNITY): Payer: Self-pay | Admitting: Anesthesiology

## 2013-09-17 NOTE — Patient Instructions (Addendum)
[address]  09/17/2013   Your procedure is scheduled on: Tuesday June 2nd, 2015  Report to [facility] Main Entrance and follow signs to  [place] at 530 AM.  Call this number if you have problems the morning of surgery [phone]   Remember:  Short stay will draw your blood type morning of surgery.  TAKE ONE HALF DOSAGE INSULIN Monday NIGHT- HAVE SNACK BEFORE BEDTIME OR MIDNIGHT Monday NIGHT  Do not eat food or drink liquids :After Midnight.MONDAY NIGHT   DO NOT TAKE ANY INSULIN Tuesday MORNING   Take these medicines the morning of surgery with A SIP OF WATER: OMEPRAZOLE                               You may not have any metal on your body including hair pins and piercings  Do not wear jewelry, make-up, lotions, powders, or deodorant.   Men may shave face and neck.  Do not bring valuables to the hospital. [place].  Contacts, dentures or bridgework may not be worn into surgery.  Leave suitcase in the car. After surgery it may be brought to your room.  For patients admitted to the hospital, checkout time is 11:00 AM the day of discharge.   Patients discharged the day of surgery will not be allowed to drive home.   ________________________________________________________________________  [facility] - Preparing for Surgery Before surgery, you can play an important role.  Because skin is not sterile, your skin needs to be as free of germs as possible.  You can reduce the number of germs on your skin by washing with CHG (chlorahexidine gluconate) soap before surgery.  CHG is an antiseptic cleaner which kills germs and bonds with the skin to continue killing germs even after washing. Please DO NOT use if you have an allergy to CHG or antibacterial soaps.  If your skin becomes reddened/irritated stop using the CHG and inform your nurse when you arrive at Short Stay. Do not shave (including legs and underarms) for at least 48  hours prior to the first CHG shower.  You may shave your face/neck. Please follow these instructions carefully:  1.  Shower with CHG Soap the night before surgery and the  morning of Surgery.  2.  If you choose to wash your hair, wash your hair first as usual with your  normal  shampoo.  3.  After you shampoo, rinse your hair and body thoroughly to remove the  shampoo.                           4.  Use CHG as you would any other liquid soap.  You can apply chg directly  to the skin and wash                       Gently with a scrungie or clean washcloth.  5.  Apply the CHG Soap to your body ONLY FROM THE NECK DOWN.   Do not use on face/ open                           Wound or open sores. Avoid contact with eyes, ears mouth and genitals (private parts).  Wash face,  Genitals (private parts) with your normal soap.             6.  Wash thoroughly, paying special attention to the area where your surgery  will be performed.  7.  Thoroughly rinse your body with warm water from the neck down.  8.  DO NOT shower/wash with your normal soap after using and rinsing off  the CHG Soap.                9.  Pat yourself dry with a clean towel.            10.  Wear clean pajamas.            11.  Place clean sheets on your bed the night of your first shower and do not  sleep with pets. Day of Surgery : Do not apply any lotions/deodorants the morning of surgery.  Please wear clean clothes to the hospital/surgery center.  FAILURE TO FOLLOW THESE INSTRUCTIONS MAY RESULT IN THE CANCELLATION OF YOUR SURGERY PATIENT SIGNATURE_________________________________  NURSE SIGNATURE__________________________________  ________________________________________________________________________  WHAT IS A BLOOD TRANSFUSION? Blood Transfusion Information  A transfusion is the replacement of blood or some of its parts. Blood is made up of multiple cells which provide different functions.  Red blood cells  carry oxygen and are used for blood loss replacement.  White blood cells fight against infection.  Platelets control bleeding.  Plasma helps clot blood.  Other blood products are available for specialized needs, such as hemophilia or other clotting disorders. BEFORE THE TRANSFUSION  Who gives blood for transfusions?   Healthy volunteers who are fully evaluated to make sure their blood is safe. This is blood bank blood. Transfusion therapy is the safest it has ever been in the practice of medicine. Before blood is taken from a donor, a complete history is taken to make sure that person has no history of diseases nor engages in risky social behavior (examples are intravenous drug use or sexual activity with multiple partners). The donor's travel history is screened to minimize risk of transmitting infections, such as malaria. The donated blood is tested for signs of infectious diseases, such as HIV and hepatitis. The blood is then tested to be sure it is compatible with you in order to minimize the chance of a transfusion reaction. If you or a relative donates blood, this is often done in anticipation of surgery and is not appropriate for emergency situations. It takes many days to process the donated blood. RISKS AND COMPLICATIONS Although transfusion therapy is very safe and saves many lives, the main dangers of transfusion include:   Getting an infectious disease.  Developing a transfusion reaction. This is an allergic reaction to something in the blood you were given. Every precaution is taken to prevent this. The decision to have a blood transfusion has been considered carefully by your caregiver before blood is given. Blood is not given unless the benefits outweigh the risks. AFTER THE TRANSFUSION  Right after receiving a blood transfusion, you will usually feel much better and more energetic. This is especially true if your red blood cells have gotten low (anemic). The transfusion raises  the level of the red blood cells which carry oxygen, and this usually causes an energy increase.  The nurse administering the transfusion will monitor you carefully for complications. HOME CARE INSTRUCTIONS  No special instructions are needed after a transfusion. You may find your energy is better. Speak with your caregiver about any  limitations on activity for underlying diseases you may have. SEEK MEDICAL CARE IF:   Your condition is not improving after your transfusion.  You develop redness or irritation at the intravenous (IV) site. SEEK IMMEDIATE MEDICAL CARE IF:  Any of the following symptoms occur over the next 12 hours:  Shaking chills.  You have a temperature by mouth above 102 F (38.9 C), not controlled by medicine.  Chest, back, or muscle pain.  People around you feel you are not acting correctly or are confused.  Shortness of breath or difficulty breathing.  Dizziness and fainting.  You get a rash or develop hives.  You have a decrease in urine output.  Your urine turns a dark color or changes to pink, red, or brown. Any of the following symptoms occur over the next 10 days:  You have a temperature by mouth above 102 F (38.9 C), not controlled by medicine.  Shortness of breath.  Weakness after normal activity.  The white part of the eye turns yellow (jaundice).  You have a decrease in the amount of urine or are urinating less often.  Your urine turns a dark color or changes to pink, red, or brown. Document Released: 04/13/2000 Document Revised: 07/09/2011 Document Reviewed: 12/01/2007 [facility] Patient Information 2014 [place], [place].  _______________________________________________________________________

## 2013-09-18 ENCOUNTER — Ambulatory Visit (HOSPITAL_COMMUNITY)
Admission: RE | Admit: 2013-09-18 | Discharge: 2013-09-18 | Disposition: A | Discharge status: Home / Self Care | Payer: Medicare Other | Source: Ambulatory Visit | Attending: Anesthesiology | Admitting: Anesthesiology

## 2013-09-18 ENCOUNTER — Encounter (INDEPENDENT_AMBULATORY_CARE_PROVIDER_SITE_OTHER): Payer: Self-pay

## 2013-09-18 ENCOUNTER — Encounter (HOSPITAL_COMMUNITY)
Admission: RE | Admit: 2013-09-18 | Discharge: 2013-09-18 | Disposition: A | Discharge status: Home / Self Care | Payer: Medicare Other | Source: Ambulatory Visit | Attending: Urology | Admitting: Urology

## 2013-09-18 ENCOUNTER — Encounter (HOSPITAL_COMMUNITY): Payer: Self-pay

## 2013-09-18 DIAGNOSIS — I10 Essential (primary) hypertension: Secondary | ICD-10-CM | POA: Insufficient documentation

## 2013-09-18 DIAGNOSIS — E119 Type 2 diabetes mellitus without complications: Secondary | ICD-10-CM | POA: Insufficient documentation

## 2013-09-18 DIAGNOSIS — K449 Diaphragmatic hernia without obstruction or gangrene: Secondary | ICD-10-CM | POA: Insufficient documentation

## 2013-09-18 HISTORY — DX: Unspecified hemorrhoids: K64.9

## 2013-09-18 LAB — BASIC METABOLIC PANEL
BUN: 21 mg/dL (ref 6–23)
CHLORIDE: 105 meq/L (ref 96–112)
CO2: 25 meq/L (ref 19–32)
Calcium: 9.3 mg/dL (ref 8.4–10.5)
Creatinine, Ser: 1.49 mg/dL — ABNORMAL HIGH (ref 0.50–1.35)
GFR calc Af Amer: 52 mL/min — ABNORMAL LOW (ref >90)
GFR calc non Af Amer: 44 mL/min — ABNORMAL LOW (ref >90)
GLUCOSE: 79 mg/dL (ref 70–99)
POTASSIUM: 4.4 meq/L (ref 3.7–5.3)
SODIUM: 142 meq/L (ref 137–147)

## 2013-09-18 LAB — CBC
HCT: 43.6 % (ref 39.0–52.0)
HEMOGLOBIN: 15 g/dL (ref 13.0–17.0)
MCH: 30.6 pg (ref 26.0–34.0)
MCHC: 34.4 g/dL (ref 30.0–36.0)
MCV: 89 fL (ref 78.0–100.0)
Platelets: 203 10*3/uL (ref 150–400)
RBC: 4.9 MIL/uL (ref 4.22–5.81)
RDW: 13.3 % (ref 11.5–15.5)
WBC: 6.3 10*3/uL (ref 4.0–10.5)

## 2013-09-18 LAB — APTT: aPTT: 33 seconds (ref 24–37)

## 2013-09-18 LAB — PROTIME-INR
INR: 1.13 (ref 0.00–1.49)
Prothrombin Time: 14.3 seconds (ref 11.6–15.2)

## 2013-09-18 NOTE — Progress Notes (Signed)
Received stress test report  10/14- on chart

## 2013-09-18 NOTE — Progress Notes (Signed)
09/18/13 1018  OBSTRUCTIVE SLEEP APNEA  Have you ever been diagnosed with sleep apnea through a sleep study? No  Do you snore loudly (loud enough to be heard through closed doors)?  0  Do you often feel tired, fatigued, or sleepy during the daytime? 1  Has anyone observed you stop breathing during your sleep? 0  Do you have, or are you being treated for high blood pressure? 1  BMI more than 35 kg/m2? 0  Age over 75 years old? 1  Neck circumference greater than 40 cm/16 inches? 0  Gender: 1  Obstructive Sleep Apnea Score 4  Score 4 or greater  Results sent to PCP

## 2013-09-18 NOTE — Progress Notes (Signed)
ekg 1/15 with LOV Dr Sabra Heck 5/15 on chart

## 2013-09-28 MED ORDER — GENTAMICIN SULFATE 40 MG/ML IJ SOLN
390.0000 mg | INTRAVENOUS | Status: AC
  Administered 2013-09-29: 390 mg via INTRAVENOUS
  Filled 2013-09-28 (×2): qty 9.75

## 2013-09-28 NOTE — H&P (Signed)
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 He wears 1 pad a day when he is active, but otherwise is almost dry. He has urge incontinence and hourly frequency. He had trouble to void with Myrbetriq and VESIcare failed.   Review of Systems: No change in bowel or neurologic systems.   Urgency, frequency, and stress incontinence stable.   Urinalysis: I reviewed, negative.    Past Medical History Problems  1. History of diabetes mellitus (V12.29) 2. History of esophageal reflux (V12.79) 3. History of hemorrhoids (V13.89) 4. History of hypertension (V12.59) 5. Personal history of prostate cancer (V10.46)  Surgical History Problems  1. History of Appendectomy 2. History of Hemorrhoidectomy 3. History of Hernia Repair 4. History of Prostatectomy Robotic-Assisted  Current Meds 1. Aspirin 81 MG Oral Tablet;  Therapy: (Recorded:29Jan2013) to Recorded 2. B Complex TABS;  Therapy: (Recorded:06Sep2013) to Recorded 3. BD Pen Needle Nano U/F 32G X 4 MM Miscellaneous;  Therapy: [id number] to Recorded 4. HumaLOG KwikPen 100 UNIT/ML Subcutaneous Solution Pen-injector;  Therapy: 03Oct2014 to Recorded 5. HumaLOG Mix 50/50 KwikPen SUSP;  Therapy: (Recorded:04Apr2014) to Recorded 6. Lantus SoloStar 100 UNIT/ML SOLN;  Therapy: [id number] to Recorded 7. Lantus SoloStar 100 UNIT/ML Subcutaneous Solution Pen-injector;  Therapy: 02Oct2014 to Recorded 8. Lisinopril 10 MG Oral Tablet;  Therapy: 22Aug2012 to Recorded 9. Multivitamins TABS;  Therapy: (Recorded:29Jan2013) to Recorded 10. Myrbetriq 50 MG Oral Tablet Extended Release 24 Hour; Take 1 tablet daily;   Therapy: [id number] to (Evaluate:05Dec2015); Last Rx:10Dec2014 Ordered 11. Omeprazole 20 MG Oral Capsule Delayed Release;   Therapy: 06Dec2011 to Recorded 12. VESIcare 5 MG Oral Tablet; Take 1 tablet daily;   Therapy: [id number] to (Evaluate:05Dec2015); Last Rx:10Dec2014  Ordered  Allergies Medication  1. Sulfa Drugs  Family History Problems  1. Family history of Acute Myocardial Infarction (V17.3) 2. Family history of Cancer 3. Family history of Family Health Status Number Of Children   3 living children 4. Family history of prostate cancer [id number]) : Father 5. Family history of Heart Disease (V17.49) 6. Family history of Prostate Cancer (V16.42)  Social History Problems  1. Denied: History of Alcohol Use 2. Marital History - Currently Married 3. Never A Smoker 4. Occupation: Retired  Engineer, site Vital Signs [Data Includes: Last 1 Day]  Recorded: [id number] 09:37AM  Blood Pressure: 164 / 83 Temperature: 98 F Heart Rate: 78  Results/Data  Urine [Data Includes: Last 1 Day]   [id number]  COLOR YELLOW   APPEARANCE CLEAR   SPECIFIC GRAVITY 1.025   pH 5.5   GLUCOSE NEG mg/dL  BILIRUBIN NEG   KETONE NEG mg/dL  BLOOD NEG   PROTEIN TRACE mg/dL  UROBILINOGEN 0.2 mg/dL  NITRITE NEG   LEUKOCYTE ESTERASE NEG    Assessment Assessed  1. Increased urinary frequency (788.41) 2. Urge and stress incontinence (788.33)  Plan Increased urinary frequency, Male stress incontinence, Urge and stress incontinence  1. Get Outside Records Office  Follow-up  Status: Hold For - Records,Records Release   Requested for: [id number]  Discussion/Summary   I drew Victor Gould a picture. Watchful waiting discussed. Male sling discussed in detail.   I drew him a picture and we talked about a male sling in detail. Pros, cons, general surgical and anesthetic risks, and other options including behavioral therapy, the artificial sphincter, and watchful waiting were discussed. He understands that male slings are successful in 80-85% of cases for stress incontinence (dry in approximately half), 50% for urge incontinence, and  that in a small percentage of cases the incontinence can worsen. Surgical risks were described but not limited to the discussion of injury to  neighboring structures including the bowel (with possible life-threatening sepsis and colostomy), bladder, urethra, and tendons/muscles (all resulting in further surgery). We talked about injury to nerves/soft tissue leading to debilitating and intractable pelvic, abdominal, and lower extremity pain syndromes and neuropathies. The risks of urinary retention requiring catheterization and slowing of urinary stream were discussed. The risk of urethral erosion and infection were discussed with sequelae. Bleeding risks with transfusion rates and risks of perineal numbness and erectile dysfunction were discussed. The usual post-operative course was described. The patient understands that he might not reach his treatment goal and that he might be worse following surgery.  Artificial sphincter discussed in detail.  I drew him a picture and we talked about an artificial sphincter in detail. Pros, cons, general surgical and anesthetic risks, and other options including behavioral therapy, the male sling, and watchful waiting were discussed. He understands that sphincters are successful in 80-90% of cases for stress incontinence (dry in approximately half), 50% for urge incontinence, and that in a small percentage of cases the incontinence can worsen. Surgical risks were described but not limited to the discussion of injury to neighboring structures including the bowel (with possible life-threatening sepsis and colostomy), and bladder, urethra (all resulting in further surgery). The risk of urethral erosion and infection were discussed with sequelae. The risks of malfunction, atrophy, and hernia/skin erosion and management were discussed. Bleeding risks with transfusion rates and risks of perineal numbness and erectile dysfunction were discussed. The risks of urinary retention requiring catheterization and slowing of urinary stream were discussed. We talked about injury to nerves/soft tissue leading to debilitating and  intractable pelvic, abdominal, and lower extremity pain syndromes and neuropathies. The usual post-operative course and time of activation was described. The patient understands that he might not reach his treatment goal and that he might be worse following surgery.  We are going to get history notes since he has had bilateral hernia repairs with mesh, but I do not see this as a significant clinical problem. He is right handed. My recommendations regarding degree of leakage relative to artificial sphincter were discussed. Even prior to this, Victor Gould would like to have a male sling. Victor Gould is going to schedule male sling. Literature given. Medical records will be obtained .  After a thorough review of the management options for the patient's condition the patient  elected to proceed with surgical therapy as noted above. We have discussed the potential benefits and risks of the procedure, side effects of the proposed treatment, the likelihood of the patient achieving the goals of the procedure, and any potential problems that might occur during the procedure or recuperation. Informed consent has been obtained.

## 2013-09-29 ENCOUNTER — Ambulatory Visit (HOSPITAL_COMMUNITY): Payer: Medicare Other | Admitting: *Deleted

## 2013-09-29 ENCOUNTER — Encounter (HOSPITAL_COMMUNITY)
Admission: RE | Disposition: A | Discharge status: Home / Self Care | Payer: Self-pay | Source: Ambulatory Visit | Attending: Urology

## 2013-09-29 ENCOUNTER — Encounter (HOSPITAL_COMMUNITY): Payer: Self-pay | Admitting: *Deleted

## 2013-09-29 ENCOUNTER — Encounter (HOSPITAL_COMMUNITY): Payer: Medicare Other | Admitting: *Deleted

## 2013-09-29 ENCOUNTER — Observation Stay (HOSPITAL_COMMUNITY)
Admission: RE | Admit: 2013-09-29 | Discharge: 2013-09-30 | Disposition: A | Discharge status: Home / Self Care | Payer: Medicare Other | Source: Ambulatory Visit | Attending: Urology | Admitting: Urology

## 2013-09-29 DIAGNOSIS — R32 Unspecified urinary incontinence: Secondary | ICD-10-CM | POA: Diagnosis present

## 2013-09-29 DIAGNOSIS — Z9089 Acquired absence of other organs: Secondary | ICD-10-CM | POA: Insufficient documentation

## 2013-09-29 DIAGNOSIS — Z9079 Acquired absence of other genital organ(s): Secondary | ICD-10-CM | POA: Insufficient documentation

## 2013-09-29 DIAGNOSIS — K219 Gastro-esophageal reflux disease without esophagitis: Secondary | ICD-10-CM | POA: Insufficient documentation

## 2013-09-29 DIAGNOSIS — N3946 Mixed incontinence: Principal | ICD-10-CM | POA: Insufficient documentation

## 2013-09-29 DIAGNOSIS — Z794 Long term (current) use of insulin: Secondary | ICD-10-CM | POA: Insufficient documentation

## 2013-09-29 DIAGNOSIS — E119 Type 2 diabetes mellitus without complications: Secondary | ICD-10-CM | POA: Insufficient documentation

## 2013-09-29 DIAGNOSIS — I10 Essential (primary) hypertension: Secondary | ICD-10-CM | POA: Insufficient documentation

## 2013-09-29 DIAGNOSIS — Z7982 Long term (current) use of aspirin: Secondary | ICD-10-CM | POA: Insufficient documentation

## 2013-09-29 DIAGNOSIS — Z8546 Personal history of malignant neoplasm of prostate: Secondary | ICD-10-CM | POA: Insufficient documentation

## 2013-09-29 DIAGNOSIS — Z79899 Other long term (current) drug therapy: Secondary | ICD-10-CM | POA: Insufficient documentation

## 2013-09-29 HISTORY — PX: URETHRAL SLING: SHX2621

## 2013-09-29 HISTORY — PX: CYSTOSCOPY: SHX5120

## 2013-09-29 LAB — GLUCOSE, CAPILLARY
GLUCOSE-CAPILLARY: 154 mg/dL — AB (ref 70–99)
GLUCOSE-CAPILLARY: 166 mg/dL — AB (ref 70–99)
Glucose-Capillary: 192 mg/dL — ABNORMAL HIGH (ref 70–99)
Glucose-Capillary: 251 mg/dL — ABNORMAL HIGH (ref 70–99)
Glucose-Capillary: 271 mg/dL — ABNORMAL HIGH (ref 70–99)

## 2013-09-29 LAB — HEMOGLOBIN AND HEMATOCRIT, BLOOD
HCT: 40.6 % (ref 39.0–52.0)
Hemoglobin: 13.9 g/dL (ref 13.0–17.0)

## 2013-09-29 LAB — TYPE AND SCREEN
ABO/RH(D): O POS
ANTIBODY SCREEN: NEGATIVE

## 2013-09-29 SURGERY — CREATION, URETHRAL SLING, MALE
Anesthesia: General | Site: Perineum

## 2013-09-29 MED ORDER — GLYCOPYRROLATE 0.2 MG/ML IJ SOLN
INTRAMUSCULAR | Status: AC
  Filled 2013-09-29: qty 2

## 2013-09-29 MED ORDER — PROMETHAZINE HCL 25 MG/ML IJ SOLN
6.2500 mg | INTRAMUSCULAR | Status: DC | PRN
Start: 2013-09-29 — End: 2013-09-29

## 2013-09-29 MED ORDER — CEFAZOLIN SODIUM-DEXTROSE 2-3 GM-% IV SOLR
2.0000 g | INTRAVENOUS | Status: AC
  Administered 2013-09-29: 2 g via INTRAVENOUS

## 2013-09-29 MED ORDER — ROCURONIUM BROMIDE 100 MG/10ML IV SOLN
INTRAVENOUS | Status: AC
  Filled 2013-09-29: qty 1

## 2013-09-29 MED ORDER — DEXAMETHASONE SODIUM PHOSPHATE 4 MG/ML IJ SOLN
INTRAMUSCULAR | Status: DC | PRN
  Administered 2013-09-29: 10 mg via INTRAVENOUS

## 2013-09-29 MED ORDER — ONDANSETRON HCL 4 MG/2ML IJ SOLN
INTRAMUSCULAR | Status: AC
  Filled 2013-09-29: qty 2

## 2013-09-29 MED ORDER — SCOPOLAMINE 1 MG/3DAYS TD PT72
MEDICATED_PATCH | TRANSDERMAL | Status: DC | PRN
  Administered 2013-09-29: 1 via TRANSDERMAL

## 2013-09-29 MED ORDER — NEOSTIGMINE METHYLSULFATE 10 MG/10ML IV SOLN
INTRAVENOUS | Status: AC
  Filled 2013-09-29: qty 1

## 2013-09-29 MED ORDER — STERILE WATER FOR IRRIGATION IR SOLN
Status: DC | PRN
  Administered 2013-09-29: 3000 mL

## 2013-09-29 MED ORDER — LISINOPRIL 20 MG PO TABS
20.0000 mg | ORAL_TABLET | Freq: Every morning | ORAL | Status: DC
  Administered 2013-09-29 – 2013-09-30 (×2): 20 mg via ORAL
  Filled 2013-09-29 (×2): qty 1

## 2013-09-29 MED ORDER — LIDOCAINE HCL (CARDIAC) 20 MG/ML IV SOLN
INTRAVENOUS | Status: AC
  Filled 2013-09-29: qty 5

## 2013-09-29 MED ORDER — ACETAMINOPHEN 325 MG PO TABS: 650.0000 mg | ORAL_TABLET | ORAL | Status: DC | PRN

## 2013-09-29 MED ORDER — ROCURONIUM BROMIDE 100 MG/10ML IV SOLN
INTRAVENOUS | Status: DC | PRN
  Administered 2013-09-29: 20 mg via INTRAVENOUS

## 2013-09-29 MED ORDER — ONDANSETRON HCL 4 MG/2ML IJ SOLN
INTRAMUSCULAR | Status: DC | PRN
  Administered 2013-09-29: 4 mg via INTRAVENOUS

## 2013-09-29 MED ORDER — DEXAMETHASONE SODIUM PHOSPHATE 10 MG/ML IJ SOLN
INTRAMUSCULAR | Status: AC
  Filled 2013-09-29: qty 1

## 2013-09-29 MED ORDER — ONDANSETRON HCL 4 MG/2ML IJ SOLN
4.0000 mg | INTRAMUSCULAR | Status: DC | PRN
Start: 2013-09-29 — End: 2013-09-30

## 2013-09-29 MED ORDER — METOCLOPRAMIDE HCL 5 MG/ML IJ SOLN
INTRAMUSCULAR | Status: AC
  Filled 2013-09-29: qty 2

## 2013-09-29 MED ORDER — DIPHENHYDRAMINE HCL 50 MG/ML IJ SOLN
INTRAMUSCULAR | Status: AC
  Filled 2013-09-29: qty 1

## 2013-09-29 MED ORDER — HYDROMORPHONE HCL PF 1 MG/ML IJ SOLN: 0.2500 mg | INTRAMUSCULAR | Status: DC | PRN

## 2013-09-29 MED ORDER — SCOPOLAMINE 1 MG/3DAYS TD PT72
MEDICATED_PATCH | TRANSDERMAL | Status: AC
  Filled 2013-09-29: qty 1

## 2013-09-29 MED ORDER — HYDROCODONE-ACETAMINOPHEN 5-325 MG PO TABS
1.0000 | ORAL_TABLET | ORAL | Status: DC | PRN
  Administered 2013-09-29 – 2013-09-30 (×2): 1 via ORAL
  Filled 2013-09-29 (×2): qty 1

## 2013-09-29 MED ORDER — PROPOFOL 10 MG/ML IV BOLUS
INTRAVENOUS | Status: DC | PRN
  Administered 2013-09-29: 150 mg via INTRAVENOUS

## 2013-09-29 MED ORDER — NEOSTIGMINE METHYLSULFATE 10 MG/10ML IV SOLN
INTRAVENOUS | Status: DC | PRN
  Administered 2013-09-29: 3 mg via INTRAVENOUS

## 2013-09-29 MED ORDER — METOCLOPRAMIDE HCL 5 MG/ML IJ SOLN
INTRAMUSCULAR | Status: DC | PRN
  Administered 2013-09-29: 10 mg via INTRAVENOUS

## 2013-09-29 MED ORDER — FENTANYL CITRATE 0.05 MG/ML IJ SOLN
INTRAMUSCULAR | Status: DC | PRN
  Administered 2013-09-29 (×3): 50 ug via INTRAVENOUS
  Administered 2013-09-29: 25 ug via INTRAVENOUS

## 2013-09-29 MED ORDER — SODIUM CHLORIDE 0.9 % IR SOLN
Status: DC | PRN
  Administered 2013-09-29: 09:00:00

## 2013-09-29 MED ORDER — PHENAZOPYRIDINE HCL 200 MG PO TABS
200.0000 mg | ORAL_TABLET | Freq: Once | ORAL | Status: AC
  Administered 2013-09-29: 200 mg via ORAL
  Filled 2013-09-29: qty 1

## 2013-09-29 MED ORDER — DIPHENHYDRAMINE HCL 50 MG/ML IJ SOLN
INTRAMUSCULAR | Status: DC | PRN
  Administered 2013-09-29: 12.5 mg via INTRAVENOUS

## 2013-09-29 MED ORDER — FENTANYL CITRATE 0.05 MG/ML IJ SOLN
INTRAMUSCULAR | Status: AC
  Filled 2013-09-29: qty 5

## 2013-09-29 MED ORDER — LACTATED RINGERS IV SOLN
INTRAVENOUS | Status: DC | PRN
  Administered 2013-09-29 (×2): via INTRAVENOUS

## 2013-09-29 MED ORDER — GLYCOPYRROLATE 0.2 MG/ML IJ SOLN
INTRAMUSCULAR | Status: DC | PRN
  Administered 2013-09-29: 0.3 mg via INTRAVENOUS

## 2013-09-29 MED ORDER — CEFAZOLIN SODIUM-DEXTROSE 2-3 GM-% IV SOLR
INTRAVENOUS | Status: AC
  Filled 2013-09-29: qty 50

## 2013-09-29 MED ORDER — MORPHINE SULFATE 2 MG/ML IJ SOLN
2.0000 mg | INTRAMUSCULAR | Status: DC | PRN
Start: 2013-09-29 — End: 2013-09-30

## 2013-09-29 MED ORDER — INSULIN ASPART 100 UNIT/ML ~~LOC~~ SOLN
0.0000 [IU] | Freq: Three times a day (TID) | SUBCUTANEOUS | Status: DC
  Administered 2013-09-29: 3 [IU] via SUBCUTANEOUS
  Administered 2013-09-29 – 2013-09-30 (×3): 8 [IU] via SUBCUTANEOUS

## 2013-09-29 MED ORDER — INSULIN ASPART 100 UNIT/ML ~~LOC~~ SOLN: 1.0000 [IU] | Freq: Three times a day (TID) | SUBCUTANEOUS | Status: DC

## 2013-09-29 MED ORDER — CIPROFLOXACIN HCL 250 MG PO TABS: 250.0000 mg | ORAL_TABLET | Freq: Two times a day (BID) | ORAL | Status: DC

## 2013-09-29 MED ORDER — PANTOPRAZOLE SODIUM 40 MG PO TBEC
40.0000 mg | DELAYED_RELEASE_TABLET | Freq: Every day | ORAL | Status: DC
  Administered 2013-09-30: 40 mg via ORAL
  Filled 2013-09-29: qty 1

## 2013-09-29 MED ORDER — KETOROLAC TROMETHAMINE 30 MG/ML IJ SOLN: 15.0000 mg | Freq: Once | INTRAMUSCULAR | Status: DC | PRN

## 2013-09-29 MED ORDER — HYDROCODONE-ACETAMINOPHEN 5-325 MG PO TABS: 1.0000 | ORAL_TABLET | Freq: Four times a day (QID) | ORAL | Status: DC | PRN

## 2013-09-29 MED ORDER — PROPOFOL 10 MG/ML IV BOLUS
INTRAVENOUS | Status: AC
  Filled 2013-09-29: qty 20

## 2013-09-29 MED ORDER — SODIUM CHLORIDE 0.45 % IV SOLN
INTRAVENOUS | Status: DC
  Administered 2013-09-29 – 2013-09-30 (×3): via INTRAVENOUS

## 2013-09-29 MED ORDER — SUCCINYLCHOLINE CHLORIDE 20 MG/ML IJ SOLN
INTRAMUSCULAR | Status: DC | PRN
  Administered 2013-09-29: 100 mg via INTRAVENOUS

## 2013-09-29 SURGICAL SUPPLY — 51 items
BAG URINE DRAINAGE (UROLOGICAL SUPPLIES) ×3 IMPLANT
BANDAGE GAUZE ELAST BULKY 4 IN (GAUZE/BANDAGES/DRESSINGS) ×3 IMPLANT
BLADE HEX COATED 2.75 (ELECTRODE) ×3 IMPLANT
BLADE SURG 15 STRL LF DISP TIS (BLADE) ×1 IMPLANT
BLADE SURG 15 STRL SS (BLADE) ×2
BLADE SURG SZ10 CARB STEEL (BLADE) ×3 IMPLANT
BNDG GAUZE ELAST 4 BULKY (GAUZE/BANDAGES/DRESSINGS) ×3 IMPLANT
BRIEF STRETCH FOR OB PAD LRG (UNDERPADS AND DIAPERS) ×3 IMPLANT
CATH FOLEY 2WAY SLVR  5CC 14FR (CATHETERS) ×2
CATH FOLEY 2WAY SLVR  5CC 16FR (CATHETERS)
CATH FOLEY 2WAY SLVR 5CC 14FR (CATHETERS) ×1 IMPLANT
CATH FOLEY 2WAY SLVR 5CC 16FR (CATHETERS) IMPLANT
COVER MAYO STAND STRL (DRAPES) IMPLANT
DECANTER SPIKE VIAL GLASS SM (MISCELLANEOUS) ×3 IMPLANT
DERMABOND ADVANCED (GAUZE/BANDAGES/DRESSINGS) ×2
DERMABOND ADVANCED .7 DNX12 (GAUZE/BANDAGES/DRESSINGS) ×1 IMPLANT
DRAPE CAMERA CLOSED 9X96 (DRAPES) ×3 IMPLANT
DRAPE LG THREE QUARTER DISP (DRAPES) ×3 IMPLANT
DRESSING TELFA ISLAND 4X8 (GAUZE/BANDAGES/DRESSINGS) ×3 IMPLANT
DRSG TELFA 3X8 NADH (GAUZE/BANDAGES/DRESSINGS) ×3 IMPLANT
GAUZE SPONGE 4X4 16PLY XRAY LF (GAUZE/BANDAGES/DRESSINGS) ×6 IMPLANT
GLOVE BIOGEL M STRL SZ7.5 (GLOVE) ×6 IMPLANT
GOWN STRL REUS W/ TWL XL LVL3 (GOWN DISPOSABLE) ×1 IMPLANT
GOWN STRL REUS W/TWL LRG LVL3 (GOWN DISPOSABLE) ×3 IMPLANT
GOWN STRL REUS W/TWL XL LVL3 (GOWN DISPOSABLE) ×2
HOLDER FOLEY CATH W/STRAP (MISCELLANEOUS) ×3 IMPLANT
KIT BASIN OR (CUSTOM PROCEDURE TRAY) ×3 IMPLANT
NEEDLE HYPO 22GX1.5 SAFETY (NEEDLE) IMPLANT
NEEDLE SPNL 18GX3.5 QUINCKE PK (NEEDLE) ×3 IMPLANT
PACK CYSTO (CUSTOM PROCEDURE TRAY) ×3 IMPLANT
PENCIL BUTTON HOLSTER BLD 10FT (ELECTRODE) ×3 IMPLANT
PLUG CATH AND CAP STER (CATHETERS) ×3 IMPLANT
POSITIONER SURGICAL ARM (MISCELLANEOUS) ×6 IMPLANT
SHEET LAVH (DRAPES) ×3 IMPLANT
SLING ADVANCE MALE SYSTEM ×3 IMPLANT
SUT SILK 2 0 30  PSL (SUTURE) ×2
SUT SILK 2 0 30 PSL (SUTURE) ×1 IMPLANT
SUT VIC AB 2-0 CT1 27 (SUTURE) ×4
SUT VIC AB 2-0 CT1 27XBRD (SUTURE) ×2 IMPLANT
SUT VIC AB 2-0 SH 27 (SUTURE) ×4
SUT VIC AB 2-0 SH 27X BRD (SUTURE) ×2 IMPLANT
SUT VIC AB 3-0 PS2 18 (SUTURE)
SUT VIC AB 3-0 PS2 18XBRD (SUTURE) IMPLANT
SUT VIC AB 3-0 SH 27 (SUTURE) ×8
SUT VIC AB 3-0 SH 27XBRD (SUTURE) ×4 IMPLANT
SUT VIC AB 4-0 PS2 27 (SUTURE) ×3 IMPLANT
SUT VIC AB 4-0 RB1 27 (SUTURE) ×2
SUT VIC AB 4-0 RB1 27XBRD (SUTURE) ×1 IMPLANT
SYRINGE 10CC LL (SYRINGE) ×3 IMPLANT
TOWEL OR 17X26 10 PK STRL BLUE (TOWEL DISPOSABLE) ×6 IMPLANT
YANKAUER SUCT BULB TIP NO VENT (SUCTIONS) ×6 IMPLANT

## 2013-09-29 NOTE — Progress Notes (Signed)
Vitals normal Labs normal Minimal pain Ambulating  Leg movement discussed

## 2013-09-29 NOTE — Anesthesia Preprocedure Evaluation (Signed)
Anesthesia Evaluation  Patient identified by MRN, date of birth, ID band Patient awake    Reviewed: Allergy & Precautions, H&P , NPO status , Patient's Chart, lab work & pertinent test results  Airway Mallampati: II TM Distance: >3 FB Neck ROM: Full    Dental no notable dental hx.    Pulmonary neg pulmonary ROS,  breath sounds clear to auscultation  Pulmonary exam normal       Cardiovascular hypertension, Pt. on medications Rhythm:Regular Rate:Normal     Neuro/Psych negative neurological ROS  negative psych ROS   GI/Hepatic Neg liver ROS, GERD-  Medicated,  Endo/Other  diabetes, Insulin Dependent  Renal/GU negative Renal ROS  negative genitourinary   Musculoskeletal negative musculoskeletal ROS (+)   Abdominal   Peds negative pediatric ROS (+)  Hematology negative hematology ROS (+)   Anesthesia Other Findings   Reproductive/Obstetrics negative OB ROS                           Anesthesia Physical Anesthesia Plan  ASA: III  Anesthesia Plan: General   Post-op Pain Management:    Induction: Intravenous  Airway Management Planned: LMA  Additional Equipment:   Intra-op Plan:   Post-operative Plan:   Informed Consent: I have reviewed the patients History and Physical, chart, labs and discussed the procedure including the risks, benefits and alternatives for the proposed anesthesia with the patient or authorized representative who has indicated his/her understanding and acceptance.   Dental advisory given  Plan Discussed with: CRNA and Surgeon  Anesthesia Plan Comments:         Anesthesia Quick Evaluation

## 2013-09-29 NOTE — Interval H&P Note (Signed)
History and Physical Interval Note:  09/29/2013 7:11 AM  Victor Gould  has presented today for surgery, with the diagnosis of STRESS INCONTINENCE   The various methods of treatment have been discussed with the patient and family. After consideration of risks, benefits and other options for treatment, the patient has consented to  Procedure(s): MALE SLING (N/A) CYSTOSCOPY (N/A) as a surgical intervention .  The patient's history has been reviewed, patient examined, no change in status, stable for surgery.  I have reviewed the patient's chart and labs.  Questions were answered to the patient's satisfaction.     [name] A Deann Mclaine

## 2013-09-29 NOTE — Anesthesia Postprocedure Evaluation (Signed)
  Anesthesia Post-op Note  Patient: Victor Gould  Procedure(s) Performed: Procedure(s) (LRB): MALE SLING (N/A) CYSTOSCOPY (N/A)  Patient Location: PACU  Anesthesia Type: General  Level of Consciousness: awake and alert   Airway and Oxygen Therapy: Patient Spontanous Breathing  Post-op Pain: mild  Post-op Assessment: Post-op Vital signs reviewed, Patient's Cardiovascular Status Stable, Respiratory Function Stable, Patent Airway and No signs of Nausea or vomiting  Last Vitals:  Filed Vitals:   09/29/13 1000  BP: 181/80  Pulse: 67  Temp:   Resp: 12    Post-op Vital Signs: stable   Complications: No apparent anesthesia complications

## 2013-09-29 NOTE — Progress Notes (Signed)
Dr. Kalman Shan made aware of patient's blood pressures in PACU.

## 2013-09-29 NOTE — Progress Notes (Signed)
Dr. Kalman Shan made aware of patient's blood pressures- O.K. To go to floor.

## 2013-09-29 NOTE — Op Note (Signed)
Preoperative diagnosis: Stress urinary incontinence Postoperative diagnosis: Stress urinary incontinence Surgery: Insertion of male urethral sling and cystoscopy Surgeon: Dr. Nicki Reaper Rhia Blatchford Assistant: Leta Baptist  The patient has the above diagnoses and consentedto the above procedure. Extra care was taken with leg positioning to minimize the risk of compartment syndrome and neuropathy and deep vein thrombosis. Preoperative antibiotics were given. Preoperative laboratory tests were normal  14 French catheter was inserted. I marked the appropriate perineal incision approximately 5 cm in length and dissected sharply through soft tissues exposing the bulbospongiosus muscle. It was mobilized with Metzenbaum scissors. I then opened the bulbospongiosus muscle and mobilized the bulbar urethra exposing a triangle bilaterally. I could readily see the tendon in the perineum. 3-0 Vicryl was passed thru tendon and bulb superficially.   Flexible cystoscopy was performed. Urethra and bladder neck were normal. Bladder was normal. With the usual technique pushing on the suture there was appropriate winking of the sphincter.  I was diligent to carefully mark the upper aspect of the obturator internus  below the abductor tendon bilaterally. A foramen needle was passed down to bone and then through the foramen. I made a 1 cm incision at the needle.  With excellent exposure I placed a finger in the upper aspect of the triangle underneath the pubic rami. I delivered the curved needle onto the tip of my index finger. I delivered it and attach the sling in appropriate orientation and brought it up atraumatically through the groin. Identical procedure done on the right.  I then cystoscoped the patient and there was no injury to urethra or bladder neck  I gradually pulled the sling down bringing the 3-0 Vicryl through the center aspect of the sling. I then gently pulled on both arms giving excellent rotational  ascent of the bulbar urethra  I re\re cystoscoped the patient. There was excellent winking of the sphincter and straightening of the bulbar urethra. 14 French catheter was easily inserted.  Irrigation was utilized. 3 layers of 3-0 Vicryl in an anatomic fashion was utilized to close the perineum. 4-0 Vicryl was used for the skin.  I cut the mesh sheath distal to the blue line. I made a 1 cm inguinal incision 2 cm from each angle incision. I passed the sling from the upper incision to the lower incision with a hemostat. I cut below the blue dots and irrigated the sheaths. I remove the sheaths.  The 4 inguinal incisions were closed with 4-0 Vicryl and Dermabond. Dermabond and fluff dressing was utilized.  Is very pleased with the surgery. Blood loss was less than 20 mL. Hopefully the patient will reach his treatment goal.

## 2013-09-29 NOTE — Transfer of Care (Signed)
Immediate Anesthesia Transfer of Care Note  Patient: Victor Gould  Procedure(s) Performed: Procedure(s): MALE SLING (N/A) CYSTOSCOPY (N/A)  Patient Location: PACU  Anesthesia Type:General  Level of Consciousness: Patient easily awoken, sedated, comfortable, cooperative, following commands, responds to stimulation.   Airway & Oxygen Therapy: Patient spontaneously breathing, ventilating well, oxygen via simple oxygen mask.  Post-op Assessment: Report given to PACU RN, vital signs reviewed and stable, moving all extremities.   Post vital signs: Reviewed and stable.  Complications: No apparent anesthesia complications

## 2013-09-30 LAB — GLUCOSE, CAPILLARY
Glucose-Capillary: 261 mg/dL — ABNORMAL HIGH (ref 70–99)
Glucose-Capillary: 266 mg/dL — ABNORMAL HIGH (ref 70–99)

## 2013-09-30 LAB — BASIC METABOLIC PANEL
BUN: 24 mg/dL — ABNORMAL HIGH (ref 6–23)
CALCIUM: 8.5 mg/dL (ref 8.4–10.5)
CO2: 21 mEq/L (ref 19–32)
CREATININE: 1.54 mg/dL — AB (ref 0.50–1.35)
Chloride: 100 mEq/L (ref 96–112)
GFR, EST AFRICAN AMERICAN: 50 mL/min — AB (ref >90)
GFR, EST NON AFRICAN AMERICAN: 43 mL/min — AB (ref >90)
Glucose, Bld: 288 mg/dL — ABNORMAL HIGH (ref 70–99)
Potassium: 4.5 mEq/L (ref 3.7–5.3)
Sodium: 133 mEq/L — ABNORMAL LOW (ref 137–147)

## 2013-09-30 LAB — HEMOGLOBIN AND HEMATOCRIT, BLOOD
HCT: 41 % (ref 39.0–52.0)
Hemoglobin: 13.9 g/dL (ref 13.0–17.0)

## 2013-09-30 NOTE — Progress Notes (Signed)
Patient unable to urinate after FC has been discontinued, bladder scan done with retaining  489cc. MD ordered to place F14 foley catheter and may discharge patient home.

## 2013-09-30 NOTE — Discharge Instructions (Signed)
As discussed with Dr. Bradan Congrove ° °You may resume aspirin, advil, aleve, vitamins, and supplements 7 days after surgery. ° °I have reviewed discharge instructions in detail with the patient. They will follow-up with me or their physician as scheduled. My nurse will also be calling the patients as per protocol.  °

## 2013-09-30 NOTE — Discharge Summary (Signed)
Date of admission: 09/29/2013  Date of discharge: 09/30/2013  Admission diagnosis:Stress incontinence  Discharge diagnosis: Stress incontinence  Secondary diagnoses: Stress incontinence  History and Physical: For full details, please see admission history and physical. Briefly, Victor Gould is a 73 y.o. year old patient with the above diagnosis.   Hospital Course: Surgery with male sling and post op course uneventful  Laboratory values:  Recent Labs  09/29/13 1600 09/30/13 0320  HGB 13.9 13.9  HCT 40.6 41.0    Recent Labs  09/30/13 0320  CREATININE 1.54*    Disposition: Home  Discharge instruction: The patient was instructed to be ambulatory but told to refrain from heavy lifting, strenuous activity, or driving. Described  Discharge medications:    Medication List    STOP taking these medications       aspirin EC 81 MG tablet     B-complex with vitamin C tablet     multivitamin with minerals Tabs tablet      TAKE these medications       ciprofloxacin 250 MG tablet  Commonly known as:  CIPRO  Take 1 tablet (250 mg total) by mouth 2 (two) times daily.     HYDROcodone-acetaminophen 5-325 MG per tablet  Commonly known as:  NORCO  Take 1-2 tablets by mouth every 6 (six) hours as needed.     hydrocortisone 2.5 % rectal cream  Commonly known as:  ANUSOL-HC  Place 1 application rectally 2 (two) times daily as needed for hemorrhoids or itching.     insulin glargine 100 UNIT/ML injection  Commonly known as:  LANTUS  Inject 17 Units into the skin every morning.     insulin lispro 100 UNIT/ML injection  Commonly known as:  HUMALOG  Inject 1-10 Units into the skin 3 (three) times daily before meals. 8 units and if bs is greater than 140 will increase 1 unit more for every 40. If bs is below 140 may decrease by 1 unit     lisinopril 20 MG tablet  Commonly known as:  PRINIVIL,ZESTRIL  Take 20 mg by mouth every morning.     nystatin cream  Commonly known as:   MYCOSTATIN  Apply 1 application topically 2 (two) times daily as needed for dry skin.     omeprazole 20 MG capsule  Commonly known as:  PRILOSEC  Take 20 mg by mouth daily.        Followup:      Follow-up Information   Follow up with Loreli Debruler A, MD. (office will call you with date and time of appt)    Specialty:  Urology   Contact information:   [place] [address] [phone]       Follow up with Burley Kopka A, MD. (as scheduled)    Specialty:  Urology   Contact information:   [place] [address] [phone]

## 2013-09-30 NOTE — Progress Notes (Signed)
FC F14 placed prior to discharged home, draining with clear yellow urine.  FC care bag/leg bag instructions wound care  done discussed  With patient and wife  verbalized understanding.  Discharged instuctions done.

## 2013-09-30 NOTE — Progress Notes (Signed)
Vitals normal Serum Cr stable  No pain at rest Some pain in both groins with activity Mobility good All incisions good Post op described

## 2013-10-01 ENCOUNTER — Encounter (HOSPITAL_COMMUNITY): Payer: Self-pay | Admitting: Urology

## 2013-10-13 ENCOUNTER — Encounter (HOSPITAL_COMMUNITY): Payer: Self-pay | Admitting: Emergency Medicine

## 2013-10-13 ENCOUNTER — Emergency Department (HOSPITAL_COMMUNITY)
Admission: EM | Admit: 2013-10-13 | Discharge: 2013-10-13 | Disposition: A | Discharge status: Home / Self Care | Payer: Medicare Other | Attending: Emergency Medicine | Admitting: Emergency Medicine

## 2013-10-13 DIAGNOSIS — I10 Essential (primary) hypertension: Secondary | ICD-10-CM | POA: Insufficient documentation

## 2013-10-13 DIAGNOSIS — K219 Gastro-esophageal reflux disease without esophagitis: Secondary | ICD-10-CM | POA: Insufficient documentation

## 2013-10-13 DIAGNOSIS — Z79899 Other long term (current) drug therapy: Secondary | ICD-10-CM | POA: Insufficient documentation

## 2013-10-13 DIAGNOSIS — E119 Type 2 diabetes mellitus without complications: Secondary | ICD-10-CM | POA: Insufficient documentation

## 2013-10-13 DIAGNOSIS — R339 Retention of urine, unspecified: Secondary | ICD-10-CM | POA: Insufficient documentation

## 2013-10-13 DIAGNOSIS — Z794 Long term (current) use of insulin: Secondary | ICD-10-CM | POA: Insufficient documentation

## 2013-10-13 DIAGNOSIS — Z8546 Personal history of malignant neoplasm of prostate: Secondary | ICD-10-CM | POA: Insufficient documentation

## 2013-10-13 DIAGNOSIS — Z792 Long term (current) use of antibiotics: Secondary | ICD-10-CM | POA: Insufficient documentation

## 2013-10-13 LAB — CBC WITH DIFFERENTIAL/PLATELET
Basophils Absolute: 0 10*3/uL (ref 0.0–0.1)
Basophils Relative: 1 % (ref 0–1)
Eosinophils Absolute: 0.3 10*3/uL (ref 0.0–0.7)
Eosinophils Relative: 4 % (ref 0–5)
HCT: 44 % (ref 39.0–52.0)
Hemoglobin: 15.5 g/dL (ref 13.0–17.0)
Lymphocytes Relative: 29 % (ref 12–46)
Lymphs Abs: 2.6 10*3/uL (ref 0.7–4.0)
MCH: 31.4 pg (ref 26.0–34.0)
MCHC: 35.2 g/dL (ref 30.0–36.0)
MCV: 89.1 fL (ref 78.0–100.0)
Monocytes Absolute: 0.7 10*3/uL (ref 0.1–1.0)
Monocytes Relative: 8 % (ref 3–12)
Neutro Abs: 5.2 10*3/uL (ref 1.7–7.7)
Neutrophils Relative %: 58 % (ref 43–77)
Platelets: 245 10*3/uL (ref 150–400)
RBC: 4.94 MIL/uL (ref 4.22–5.81)
RDW: 13.2 % (ref 11.5–15.5)
WBC: 8.9 10*3/uL (ref 4.0–10.5)

## 2013-10-13 LAB — I-STAT CHEM 8, ED
BUN: 22 mg/dL (ref 6–23)
CREATININE: 1.7 mg/dL — AB (ref 0.50–1.35)
Calcium, Ion: 1.2 mmol/L (ref 1.13–1.30)
Chloride: 103 mEq/L (ref 96–112)
Glucose, Bld: 291 mg/dL — ABNORMAL HIGH (ref 70–99)
HCT: 48 % (ref 39.0–52.0)
Hemoglobin: 16.3 g/dL (ref 13.0–17.0)
POTASSIUM: 4 meq/L (ref 3.7–5.3)
SODIUM: 141 meq/L (ref 137–147)
TCO2: 20 mmol/L (ref 0–100)

## 2013-10-13 LAB — URINE MICROSCOPIC-ADD ON

## 2013-10-13 LAB — URINALYSIS, ROUTINE W REFLEX MICROSCOPIC
Bilirubin Urine: NEGATIVE
Hgb urine dipstick: NEGATIVE
Ketones, ur: NEGATIVE mg/dL
Leukocytes, UA: NEGATIVE
Nitrite: NEGATIVE
Protein, ur: NEGATIVE mg/dL
SPECIFIC GRAVITY, URINE: 1.021 (ref 1.005–1.030)
Urobilinogen, UA: 0.2 mg/dL (ref 0.0–1.0)
pH: 5.5 (ref 5.0–8.0)

## 2013-10-13 MED ORDER — CEPHALEXIN 500 MG PO CAPS: 500.0000 mg | ORAL_CAPSULE | Freq: Four times a day (QID) | ORAL | Status: DC

## 2013-10-13 NOTE — ED Notes (Signed)
Pt arrived to the ED with a complaint of urinary retention.  Pt states he had his catheter removed yesterday after having bladder sling surgery.  Pt was urinating but yesterday around 1800 hrs he stopped urinating.  Pt is not in pain and feels his bladder full.

## 2013-10-13 NOTE — Discharge Instructions (Signed)
Acute Urinary Retention, Male °Acute urinary retention is the temporary inability to urinate. °This is a common problem in older men. As men age their prostates become larger and block the flow of urine from the bladder. This is usually a problem that has come on gradually.  °HOME CARE INSTRUCTIONS °If you are sent home with a Foley catheter and a drainage system, you will need to discuss the best course of action with your health care provider. While the catheter is in, maintain a good intake of fluids. Keep the drainage bag emptied and lower than your catheter. This is so that contaminated urine will not flow back into your bladder, which could lead to a urinary tract infection. °There are two main types of drainage bags. One is a large bag that usually is used at night. It has a good capacity that will allow you to sleep through the night without having to empty it. The second type is called a leg bag. It has a smaller capacity, so it needs to be emptied more frequently. However, the main advantage is that it can be attached by a leg strap and can go underneath your clothing, allowing you the freedom to move about or leave your home. °Only take over-the-counter or prescription medicines for pain, discomfort, or fever as directed by your health care provider.  °SEEK MEDICAL CARE IF: °· You develop a low-grade fever. °· You experience spasms or leakage of urine with the spasms. °SEEK IMMEDIATE MEDICAL CARE IF:  °· You develop chills or fever. °· Your catheter stops draining urine. °· Your catheter falls out. °· You start to develop increased bleeding that does not respond to rest and increased fluid intake. °MAKE SURE YOU: °· Understand these instructions. °· Will watch your condition. °· Will get help right away if you are not doing well or get worse. °Document Released: 07/23/2000 Document Revised: 12/17/2012 Document Reviewed: 09/25/2012 °Exit[employer]® Patient Information ©2014 [employer]. ° °

## 2013-10-13 NOTE — ED Provider Notes (Signed)
CSN: [id number]     Arrival date & time 10/13/13  0151 History   First MD Initiated Contact with Patient 10/13/13 0220     Chief Complaint  Patient presents with  . Urinary Retention     (Consider location/radiation/quality/duration/timing/severity/associated sxs/prior Treatment) The history is provided by the patient.  Patient with a bladder suspension procedure who has now had 5 episodes of urinary retention since the procedure.  No f/c/r.  No pain.  Last void was 6 pm.  No Ha.  No n/v/d.  No rashes  Past Medical History  Diagnosis Date  . PONV (postoperative nausea and vomiting)   . Hypertension     PCP [name] at [place]  . Diabetes mellitus   . Cancer     prostate  . GERD (gastroesophageal reflux disease)   . Hemorrhoids    Past Surgical History  Procedure Laterality Date  . Colonoscopy with polypectomy    . Appendectomy    . Hernia repair      inguinal bilaterally  . Robot assisted laparoscopic radical prostatectomy  06/25/2011    Procedure: ROBOTIC ASSISTED LAPAROSCOPIC RADICAL PROSTATECTOMY LEVEL 2;  Surgeon: [name], MD;  Location: [facility];  Service: Urology;  Laterality: N/A;  . Urethral sling N/A 09/29/2013    Procedure: MALE SLING;  Surgeon: [name], MD;  Location: [facility];  Service: Urology;  Laterality: N/A;  . Cystoscopy N/A 09/29/2013    Procedure: CYSTOSCOPY;  Surgeon: [name], MD;  Location: [facility];  Service: Urology;  Laterality: N/A;   History reviewed. No pertinent family history. History  Substance Use Topics  . Smoking status: Never Smoker   . Smokeless tobacco: Never Used  . Alcohol Use: No    Review of Systems  Constitutional: Negative for fever.  Gastrointestinal: Negative for vomiting.  Genitourinary: Positive for difficulty urinating. Negative for dysuria, hematuria, flank pain and genital sores.  All other systems reviewed and are negative.     Allergies  Sulfa antibiotics  Home Medications   Prior to  Admission medications   Medication Sig Start Date End Date Taking? Authorizing Ahmar Pickrell  ciprofloxacin (CIPRO) 250 MG tablet Take 1 tablet (250 mg total) by mouth 2 (two) times daily. 09/29/13   Marcie Bal, PA-C  HYDROcodone-acetaminophen (NORCO) 5-325 MG per tablet Take 1-2 tablets by mouth every 6 (six) hours as needed. 09/29/13   Marcie Bal, PA-C  hydrocortisone (ANUSOL-HC) 2.5 % rectal cream Place 1 application rectally 2 (two) times daily as needed for hemorrhoids or itching.    Historical Isaiha Asare, MD  insulin glargine (LANTUS) 100 UNIT/ML injection Inject 17 Units into the skin every morning.    Historical Lewanna Petrak, MD  insulin lispro (HUMALOG) 100 UNIT/ML injection Inject 1-10 Units into the skin 3 (three) times daily before meals. 8 units and if bs is greater than 140 will increase 1 unit more for every 40. If bs is below 140 may decrease by 1 unit    Historical Clara Smolen, MD  lisinopril (PRINIVIL,ZESTRIL) 20 MG tablet Take 20 mg by mouth every morning.    Historical Amara Manalang, MD  nystatin cream (MYCOSTATIN) Apply 1 application topically 2 (two) times daily as needed for dry skin.    Historical Olia Hinderliter, MD  omeprazole (PRILOSEC) 20 MG capsule Take 20 mg by mouth daily.    Historical Noah Lembke, MD   BP 197/110  Pulse 100  Temp(Src) 97.9 F (36.6 C) (Oral)  Resp 16  SpO2 97% Physical Exam  Constitutional: He  is oriented to person, place, and time. He appears well-developed and well-nourished. No distress.  HENT:  Head: Normocephalic and atraumatic.  Mouth/Throat: Oropharynx is clear and moist.  Eyes: Conjunctivae are normal. Pupils are equal, round, and reactive to light.  Neck: Normal range of motion. Neck supple.  Cardiovascular: Normal rate, regular rhythm and intact distal pulses.   Pulmonary/Chest: Effort normal and breath sounds normal. He has no wheezes. He has no rales.  Abdominal: Soft. Bowel sounds are normal. There is no tenderness. There is no rebound and no  guarding.  Musculoskeletal: Normal range of motion.  Neurological: He is alert and oriented to person, place, and time.  Skin: Skin is warm and dry.  Psychiatric: He has a normal mood and affect.    ED Course  Procedures (including critical care time) Labs Review Labs Reviewed  CBC WITH DIFFERENTIAL  URINALYSIS, ROUTINE W REFLEX MICROSCOPIC  I-STAT CHEM 8, ED    Imaging Review No results found.   EKG Interpretation None      MDM   Final diagnoses:  None    Creatinine is rising informed patient and his wife this will need recheck .  Will cover with Keflex which will not affect renal function.  Call for voiding trial with Dr. Matilde Sprang    April Alfonso Patten, MD 10/13/13 [phone]

## 2013-10-29 ENCOUNTER — Other Ambulatory Visit (HOSPITAL_COMMUNITY): Payer: Medicare Other

## 2013-11-03 ENCOUNTER — Ambulatory Visit (HOSPITAL_COMMUNITY): Admission: RE | Admit: 2013-11-03 | Payer: Medicare Other | Source: Ambulatory Visit | Admitting: Urology

## 2013-11-03 ENCOUNTER — Encounter (HOSPITAL_COMMUNITY): Admission: RE | Payer: Self-pay | Source: Ambulatory Visit

## 2013-11-03 SURGERY — CREATION, PUBOVAGINAL SLING
Anesthesia: General

## 2014-06-26 ENCOUNTER — Emergency Department: Payer: Self-pay | Admitting: Emergency Medicine

## 2014-07-29 DIAGNOSIS — E039 Hypothyroidism, unspecified: Secondary | ICD-10-CM | POA: Insufficient documentation

## 2014-08-17 NOTE — Discharge Summary (Signed)
PATIENT NAME:  Victor Gould, Victor Gould MR#:  [id number] DATE OF BIRTH:  04-08-1941  DATE OF ADMISSION:  12/07/2011 DATE OF DISCHARGE:  12/08/2011  PRIMARY CARE PHYSICIAN: [name], MD  DISCHARGE DIAGNOSES:  1. Pneumonia. 2. Pleuritic chest pain. 3. Hypertension. 4. Diabetes.  HISTORY OF PRESENT ILLNESS: This is a pleasant 74 year old with hypertension and diabetes who came in with the onset of pleuritic chest pain and low grade fever. He also had some nausea and vomiting. He was treated initially as an outpatient with ciprofloxacin but his pain worsened. He was admitted with a positive d-dimer of 0.79, but a CT was negative for a pulmonary embolus. It did show a lingular pneumonia versus atelectasis.    HOSPITAL COURSE: The patient was admitted and treated with IV levofloxacin and ceftriaxone.  He also received IV steroids.  His vital signs remained stable and he was never on oxygen. He had cardiac enzymes which were negative.  He was continued on his outpatient diabetes and hypertension medications.   On 12/08/2011 he was feeling quite well, no further chest pain, was able to breathe deeply without any cough or shortness of breath, and he was ambulating well.   DISCHARGE MEDICATIONS: 1. Levofloxacin 500 mg once a day for 5 days. 2. Prednisone 20 mg tablets three tablets a day for 2 days, then two tablets a day for 2 days, then one tablet a day for 2 days, and then discontinue. 3. He will also remain on his outpatient lisinopril, multivitamin, glimepiride, Victoza, Lantus, and Prilosec. He was advised he may need to increase his insulin dosing somewhat while he is on the steroids.   DISCHARGE DIET: Carbohydrate-controlled, regular consistency.   DISCHARGE ACTIVITY/LIMITATIONS: As tolerated.  DISCHARGE FOLLOWUP: The patient should schedule a followup with Dr. Sabra Heck within 2 weeks.  ____________________________ Cheral Marker. Ola Spurr, MD dpf:slb D: 12/08/2011 12:40:47 ET T: 12/10/2011 11:03:40  ET JOB#: [id number]  cc: Cheral Marker. Ola Spurr, MD, <Dictator> Rusty Aus, MD Laysha Childers [facility] MD ELECTRONICALLY SIGNED 12/18/2011 12:35

## 2014-08-17 NOTE — H&P (Signed)
PATIENT NAME:  Victor Gould, LOPER MR#:  [id number] DATE OF BIRTH:  07-Sep-1940  DATE OF ADMISSION:  12/07/2011  PRIMARY CARE PHYSICIAN: Dr. [name]  CHIEF COMPLAINT: Left-sided pleuritic chest pain in the upper part of the chest since today.   HISTORY OF PRESENT ILLNESS: Victor Gould is a 74 year old Caucasian gentleman with past medical history of type 2 diabetes, hypertension comes to the Emergency Room with the above-mentioned chief complaint. He is accompanied by his wife. Per wife, patient started having low-grade fever about 99.7 to 100.7 past Sunday and had some nausea and diarrhea. He was seen by his primary care physician, Dr. [name], on past Monday, was thought to be due to viral syndrome and was given a prescription of Cipro to be filled in if he does not feel better for next couple of days. Patient filled in the prescription for Cipro on Wednesday, took about three doses of medication, came today with pleuritic chest pain on the left upper part along with nausea, poor appetite and low-grade fever at home today of 99.7. In the Emergency Room EKG is normal sinus rhythm. His first set of cardiac enzymes are negative. His d-dimer was elevated 0.79, underwent CT of the chest which shows suspected left upper lobe lingular pneumonia versus atelectasis. He received a dose of IV Levaquin. He is being admitted for further evaluation and management. White count was 5.4.   PAST MEDICAL HISTORY:  1. Type 2 diabetes on insulin now, followed by Dr. Gabriel Carina.   2. Hypertension.  3. Positional vertigo.  4. Colon polyps.  5. Prostate cancer, T2 N0 M0 status post prostatectomy.   PAST SURGICAL HISTORY:  1. Inguinal hernia repair x2.  2. Hemorrhoid surgery. 3. Appendectomy. 4. Radical prostatectomy 2013.   MEDICATIONS:  1. Omeprazole 20 mg p.o. daily.  2. Lisinopril 10 mg daily.  3. Amaryl 4 mg daily.  4. Aspirin 81 mg daily.  5. Victoza injection 1.2 mg sub-Q daily.   ALLERGIES: Sulfa.    FAMILY HISTORY: Mother died of congestive heart failure.   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 He is a nonsmoker.    REVIEW OF SYSTEMS: CONSTITUTIONAL: Positive for low-grade fever, fatigue, weakness. EYES: No blurred or double vision. No glaucoma. ENT: No tinnitus, ear pain, hearing loss. RESPIRATORY: Mild cough. Positive for pleuritic chest pain. CARDIOVASCULAR: Pleuritic chest pain. Positive for hypertension. No edema, orthopnea or dyspnea. GASTROINTESTINAL: Positive for some nausea. No abdominal pain or gastroesophageal reflux disease. GENITOURINARY: No dysuria, hematuria. ENDOCRINE: No polyuria, nocturia, thyroid problems. HEMATOLOGY: No anemia or easy bruising. SKIN: No acne or rash. MUSCULOSKELETAL: Positive for arthritis. NEUROLOGIC: No cerebrovascular accident, transient ischemic attack. PSYCH: No anxiety or depression. All other systems reviewed and negative.   PHYSICAL EXAMINATION:  GENERAL: Patient is awake, alert, oriented x3, not in acute distress.   VITAL SIGNS: He is afebrile, pulse 86, blood pressure 154/81, sats 96% on room air.   HEENT: Atraumatic, normocephalic. Pupils are equal, round, and reactive to light and accommodation. Extraocular movements are intact. Oral mucosa is moist.   NECK: Supple. No JVD. No carotid bruit.   RESPIRATORY: Clear to auscultation bilaterally. No rales, rhonchi, respiratory distress or labored breathing.   CARDIOVASCULAR: Both the heart sounds are normal. Rate, rhythm is regular. PMI not lateralized. Chest nontender.   EXTREMITIES: Good pedal pulses, good femoral pulses. No lower extremity edema.   ABDOMEN: Soft, benign, nontender. No organomegaly. Positive bowel sounds.   NEUROLOGIC: Grossly intact cranial nerves II through  XII. No motor or sensory deficits.   SKIN: Warm and dry.   PSYCH: Patient is awake, alert, oriented x3.   LABORATORY, DIAGNOSTIC AND RADIOLOGICAL DATA: EKG within normal sinus rhythm. CBC within  normal limits except hemoglobin of 13.4 and hematocrit of 38.5. Glucose 381, BUN 21, creatinine 1.50, sodium 136, potassium 3.9, chloride 103, bicarbonate 25, calcium 8.6. Troponin 0.02, d-dimer 0.79. CT of the chest done with contrast shows no evidence of pulmonary embolus, focal anterior left upper lobe/lingular pneumonia versus atelectasis. Bilateral upper lung zone faint tiny punctate noncalcified pulmonary nodules. 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   ASSESSMENT: 74 year old Victor Gould with:  1. Left-sided pleuritic chest pain suspected from left upper lobe lingular pneumonia versus atelectasis. Patient has been having fever since past Sunday, decreased appetite, nausea, diarrhea, mild cough. Empirically on Cipro since Wednesday, took about three doses. CT of the chest no PE, bilateral lower lobe atelectasis and left upper lobe lingular pneumonia versus atelectasis with multiple tiny nodules in the bilateral upper lobes consistent with calcified nodules versus granulomatous nodules versus low possibility of metastatic nodules. Patient's saturations is 98% on room air. Will admit patient to off unit telemetry floor. Continue IV fluids. Follow blood cultures and sputum culture if patient able to produce. Will continue IV Levaquin for now. Follow-up MET-B. Incentive spirometry and p.r.n. oxygen, p.r.n. nebulizers if needed.  2. Chest pain. Rule out myocardial infarction. Risk factors are type 2 diabetes, hypertension. EKG shows normal sinus rhythm. Will continue cardiac enzymes x2, p.o. aspirin, p.r.n. nitroglycerin. Will continue lisinopril as blood pressure allows.  3. Hypertension, on lisinopril.  4. Type 2 diabetes on insulin Lantus, Victoza sub-Q and glimepiride which will continue along with sliding scale insulin.  5. Deep vein thrombosis prophylaxis with heparin.    6. Further work-up according to patient's clinical course. Hospital admission plan was discussed with patient and his wife who is agreeable to it.   TIME SPENT: 50 minutes.  ____________________________ Hart Rochester Posey Pronto, MD sap:cms D: 12/07/2011 03:34:51 ET T: 12/07/2011 08:00:33 ET JOB#: [id number]  cc: Braeley Buskey A. Posey Pronto, MD, <Dictator> Rusty Aus, MD Ilda Basset MD ELECTRONICALLY SIGNED 12/10/2011 16:06

## 2015-06-03 ENCOUNTER — Other Ambulatory Visit: Payer: Self-pay | Admitting: Ophthalmology

## 2015-06-03 DIAGNOSIS — H4912 Fourth [trochlear] nerve palsy, left eye: Secondary | ICD-10-CM

## 2015-06-23 ENCOUNTER — Ambulatory Visit
Admission: RE | Admit: 2015-06-23 | Discharge: 2015-06-23 | Disposition: A | Discharge status: Home / Self Care | Payer: Medicare Other | Source: Ambulatory Visit | Attending: Ophthalmology | Admitting: Ophthalmology

## 2015-06-23 DIAGNOSIS — I6782 Cerebral ischemia: Secondary | ICD-10-CM | POA: Diagnosis not present

## 2015-06-23 DIAGNOSIS — H4912 Fourth [trochlear] nerve palsy, left eye: Secondary | ICD-10-CM

## 2015-06-23 DIAGNOSIS — G319 Degenerative disease of nervous system, unspecified: Secondary | ICD-10-CM | POA: Diagnosis not present

## 2015-06-23 LAB — POCT I-STAT CREATININE: CREATININE: 1.5 mg/dL — AB (ref 0.61–1.24)

## 2015-06-23 MED ORDER — GADOBENATE DIMEGLUMINE 529 MG/ML IV SOLN
20.0000 mL | Freq: Once | INTRAVENOUS | Status: AC | PRN
  Administered 2015-06-23: 16 mL via INTRAVENOUS

## 2015-08-15 DIAGNOSIS — E1022 Type 1 diabetes mellitus with diabetic chronic kidney disease: Secondary | ICD-10-CM | POA: Insufficient documentation

## 2015-08-15 DIAGNOSIS — E109 Type 1 diabetes mellitus without complications: Secondary | ICD-10-CM | POA: Insufficient documentation

## 2015-08-15 DIAGNOSIS — N183 Chronic kidney disease, stage 3 (moderate): Secondary | ICD-10-CM

## 2015-08-15 DIAGNOSIS — I129 Hypertensive chronic kidney disease with stage 1 through stage 4 chronic kidney disease, or unspecified chronic kidney disease: Secondary | ICD-10-CM

## 2015-11-04 DIAGNOSIS — R7989 Other specified abnormal findings of blood chemistry: Secondary | ICD-10-CM | POA: Insufficient documentation

## 2016-05-08 DIAGNOSIS — Z8546 Personal history of malignant neoplasm of prostate: Secondary | ICD-10-CM | POA: Insufficient documentation

## 2016-08-25 ENCOUNTER — Inpatient Hospital Stay (HOSPITAL_COMMUNITY)
Admission: EM | Admit: 2016-08-25 | Discharge: 2016-09-03 | DRG: 234 | Disposition: A | Discharge status: Home / Self Care | Payer: Medicare Other | Attending: Cardiothoracic Surgery | Admitting: Cardiothoracic Surgery

## 2016-08-25 ENCOUNTER — Emergency Department (HOSPITAL_COMMUNITY): Payer: Medicare Other

## 2016-08-25 ENCOUNTER — Encounter (HOSPITAL_COMMUNITY): Payer: Self-pay | Admitting: *Deleted

## 2016-08-25 DIAGNOSIS — E119 Type 2 diabetes mellitus without complications: Secondary | ICD-10-CM

## 2016-08-25 DIAGNOSIS — Z8546 Personal history of malignant neoplasm of prostate: Secondary | ICD-10-CM

## 2016-08-25 DIAGNOSIS — I4891 Unspecified atrial fibrillation: Secondary | ICD-10-CM | POA: Diagnosis not present

## 2016-08-25 DIAGNOSIS — E1065 Type 1 diabetes mellitus with hyperglycemia: Secondary | ICD-10-CM | POA: Diagnosis present

## 2016-08-25 DIAGNOSIS — E079 Disorder of thyroid, unspecified: Secondary | ICD-10-CM | POA: Diagnosis present

## 2016-08-25 DIAGNOSIS — I2510 Atherosclerotic heart disease of native coronary artery without angina pectoris: Secondary | ICD-10-CM | POA: Diagnosis present

## 2016-08-25 DIAGNOSIS — I214 Non-ST elevation (NSTEMI) myocardial infarction: Secondary | ICD-10-CM | POA: Diagnosis not present

## 2016-08-25 DIAGNOSIS — Z79899 Other long term (current) drug therapy: Secondary | ICD-10-CM

## 2016-08-25 DIAGNOSIS — E785 Hyperlipidemia, unspecified: Secondary | ICD-10-CM | POA: Diagnosis present

## 2016-08-25 DIAGNOSIS — I10 Essential (primary) hypertension: Secondary | ICD-10-CM | POA: Diagnosis not present

## 2016-08-25 DIAGNOSIS — E876 Hypokalemia: Secondary | ICD-10-CM | POA: Diagnosis not present

## 2016-08-25 DIAGNOSIS — Z9689 Presence of other specified functional implants: Secondary | ICD-10-CM

## 2016-08-25 DIAGNOSIS — D62 Acute posthemorrhagic anemia: Secondary | ICD-10-CM | POA: Diagnosis not present

## 2016-08-25 DIAGNOSIS — E1022 Type 1 diabetes mellitus with diabetic chronic kidney disease: Secondary | ICD-10-CM | POA: Diagnosis present

## 2016-08-25 DIAGNOSIS — Z09 Encounter for follow-up examination after completed treatment for conditions other than malignant neoplasm: Secondary | ICD-10-CM

## 2016-08-25 DIAGNOSIS — I130 Hypertensive heart and chronic kidney disease with heart failure and stage 1 through stage 4 chronic kidney disease, or unspecified chronic kidney disease: Secondary | ICD-10-CM | POA: Diagnosis present

## 2016-08-25 DIAGNOSIS — Z794 Long term (current) use of insulin: Secondary | ICD-10-CM

## 2016-08-25 DIAGNOSIS — R079 Chest pain, unspecified: Secondary | ICD-10-CM | POA: Diagnosis not present

## 2016-08-25 DIAGNOSIS — I5032 Chronic diastolic (congestive) heart failure: Secondary | ICD-10-CM | POA: Diagnosis present

## 2016-08-25 DIAGNOSIS — I200 Unstable angina: Secondary | ICD-10-CM | POA: Diagnosis not present

## 2016-08-25 DIAGNOSIS — N183 Chronic kidney disease, stage 3 (moderate): Secondary | ICD-10-CM | POA: Diagnosis present

## 2016-08-25 DIAGNOSIS — K219 Gastro-esophageal reflux disease without esophagitis: Secondary | ICD-10-CM | POA: Diagnosis present

## 2016-08-25 DIAGNOSIS — G4700 Insomnia, unspecified: Secondary | ICD-10-CM | POA: Diagnosis not present

## 2016-08-25 DIAGNOSIS — Z951 Presence of aortocoronary bypass graft: Secondary | ICD-10-CM

## 2016-08-25 HISTORY — DX: Disorder of thyroid, unspecified: E07.9

## 2016-08-25 LAB — BASIC METABOLIC PANEL
Anion gap: 8 (ref 5–15)
BUN: 20 mg/dL (ref 6–20)
CALCIUM: 8.8 mg/dL — AB (ref 8.9–10.3)
CO2: 20 mmol/L — ABNORMAL LOW (ref 22–32)
CREATININE: 1.63 mg/dL — AB (ref 0.61–1.24)
Chloride: 108 mmol/L (ref 101–111)
GFR calc Af Amer: 45 mL/min — ABNORMAL LOW (ref >60)
GFR, EST NON AFRICAN AMERICAN: 39 mL/min — AB (ref >60)
GLUCOSE: 189 mg/dL — AB (ref 65–99)
Potassium: 4 mmol/L (ref 3.5–5.1)
Sodium: 136 mmol/L (ref 135–145)

## 2016-08-25 LAB — I-STAT TROPONIN, ED: TROPONIN I, POC: 0.11 ng/mL — AB (ref 0.00–0.08)

## 2016-08-25 LAB — TROPONIN I
Troponin I: 3.67 ng/mL (ref <0.03)
Troponin I: 9.32 ng/mL (ref <0.03)

## 2016-08-25 LAB — CBC
HEMATOCRIT: 42.4 % (ref 39.0–52.0)
Hemoglobin: 14.6 g/dL (ref 13.0–17.0)
MCH: 30.7 pg (ref 26.0–34.0)
MCHC: 34.4 g/dL (ref 30.0–36.0)
MCV: 89.3 fL (ref 78.0–100.0)
PLATELETS: 252 10*3/uL (ref 150–400)
RBC: 4.75 MIL/uL (ref 4.22–5.81)
RDW: 14 % (ref 11.5–15.5)
WBC: 7.5 10*3/uL (ref 4.0–10.5)

## 2016-08-25 LAB — GLUCOSE, CAPILLARY
Glucose-Capillary: 121 mg/dL — ABNORMAL HIGH (ref 65–99)
Glucose-Capillary: 263 mg/dL — ABNORMAL HIGH (ref 65–99)

## 2016-08-25 LAB — HEPARIN LEVEL (UNFRACTIONATED): Heparin Unfractionated: 0.32 IU/mL (ref 0.30–0.70)

## 2016-08-25 MED ORDER — B COMPLEX PO TABS: 1.0000 | ORAL_TABLET | Freq: Every day | ORAL | Status: DC

## 2016-08-25 MED ORDER — B COMPLEX-C PO TABS
1.0000 | ORAL_TABLET | Freq: Every day | ORAL | Status: DC
  Administered 2016-08-26 – 2016-08-27 (×2): 1 via ORAL
  Filled 2016-08-25 (×4): qty 1

## 2016-08-25 MED ORDER — ACETAMINOPHEN 500 MG PO TABS: 1000.0000 mg | ORAL_TABLET | Freq: Every day | ORAL | Status: DC | PRN

## 2016-08-25 MED ORDER — PANTOPRAZOLE SODIUM 40 MG PO TBEC
40.0000 mg | DELAYED_RELEASE_TABLET | Freq: Every day | ORAL | Status: DC
  Administered 2016-08-26 – 2016-08-27 (×2): 40 mg via ORAL
  Filled 2016-08-25 (×2): qty 1

## 2016-08-25 MED ORDER — AMLODIPINE BESYLATE 5 MG PO TABS
5.0000 mg | ORAL_TABLET | Freq: Every day | ORAL | Status: DC
  Administered 2016-08-25: 5 mg via ORAL
  Filled 2016-08-25: qty 1

## 2016-08-25 MED ORDER — HEPARIN (PORCINE) IN NACL 100-0.45 UNIT/ML-% IJ SOLN
1100.0000 [IU]/h | INTRAMUSCULAR | Status: DC
  Administered 2016-08-25: 1000 [IU]/h via INTRAVENOUS
  Administered 2016-08-26: 1100 [IU]/h via INTRAVENOUS
  Filled 2016-08-25 (×2): qty 250

## 2016-08-25 MED ORDER — VITAMIN D 1000 UNITS PO TABS
1000.0000 [IU] | ORAL_TABLET | Freq: Every day | ORAL | Status: DC
  Administered 2016-08-26 – 2016-08-27 (×2): 1000 [IU] via ORAL
  Filled 2016-08-25 (×2): qty 1

## 2016-08-25 MED ORDER — LEVOTHYROXINE SODIUM 75 MCG PO TABS
75.0000 ug | ORAL_TABLET | Freq: Every day | ORAL | Status: DC
  Administered 2016-08-26 – 2016-09-03 (×8): 75 ug via ORAL
  Filled 2016-08-25 (×8): qty 1

## 2016-08-25 MED ORDER — ACETAMINOPHEN 325 MG PO TABS: 650.0000 mg | ORAL_TABLET | ORAL | Status: DC | PRN

## 2016-08-25 MED ORDER — HEPARIN BOLUS VIA INFUSION
4000.0000 [IU] | Freq: Once | INTRAVENOUS | Status: AC
  Administered 2016-08-25: 4000 [IU] via INTRAVENOUS
  Filled 2016-08-25: qty 4000

## 2016-08-25 MED ORDER — ASPIRIN EC 81 MG PO TBEC
81.0000 mg | DELAYED_RELEASE_TABLET | Freq: Every day | ORAL | Status: DC
  Administered 2016-08-26 – 2016-08-27 (×2): 81 mg via ORAL
  Filled 2016-08-25 (×2): qty 1

## 2016-08-25 MED ORDER — ADULT MULTIVITAMIN W/MINERALS CH
1.0000 | ORAL_TABLET | Freq: Every day | ORAL | Status: DC
  Administered 2016-08-26 – 2016-08-27 (×2): 1 via ORAL
  Filled 2016-08-25 (×2): qty 1

## 2016-08-25 MED ORDER — CARVEDILOL 3.125 MG PO TABS
3.1250 mg | ORAL_TABLET | Freq: Two times a day (BID) | ORAL | Status: DC
  Administered 2016-08-25: 3.125 mg via ORAL
  Filled 2016-08-25 (×2): qty 1

## 2016-08-25 MED ORDER — ONDANSETRON HCL 4 MG/2ML IJ SOLN: 4.0000 mg | Freq: Four times a day (QID) | INTRAMUSCULAR | Status: DC | PRN

## 2016-08-25 MED ORDER — INSULIN ASPART 100 UNIT/ML ~~LOC~~ SOLN
0.0000 [IU] | Freq: Three times a day (TID) | SUBCUTANEOUS | Status: DC
  Administered 2016-08-25: 2 [IU] via SUBCUTANEOUS
  Administered 2016-08-25: 5 [IU] via SUBCUTANEOUS
  Administered 2016-08-26 (×2): 3 [IU] via SUBCUTANEOUS
  Administered 2016-08-26: 5 [IU] via SUBCUTANEOUS
  Administered 2016-08-27: 8 [IU] via SUBCUTANEOUS
  Administered 2016-08-27: 2 [IU] via SUBCUTANEOUS

## 2016-08-25 MED ORDER — NITROGLYCERIN 0.4 MG SL SUBL: 0.4000 mg | SUBLINGUAL_TABLET | SUBLINGUAL | Status: DC | PRN

## 2016-08-25 NOTE — Plan of Care (Signed)
Problem: Education: Goal: Understanding of cardiac disease, CV risk reduction, and recovery process will improve Outcome: Progressing Verbalized understanding of Nitroglycerin. Denies pain

## 2016-08-25 NOTE — ED Provider Notes (Signed)
[facility] DEPT Provider Note   CSN: [id number] Arrival date & time: 08/25/16  1021     History   Chief Complaint Chief Complaint  Patient presents with  . Chest Pain    HPI ARIK HUSMANN is a 76 y.o. male who presents with chest pain. PMH significant for insulin dependent Type 1 DM, CKD stage 3, HTN, GERD. The pain started at 8 AM while he was sitting at his computer. It is on the L side of his chest and radiates to the L arm. Feels like a squeezing and is constant. Reports associated SOB and lightheadedness. He states that he has felt similar pain before when he had pneumonia several years ago however he denies fever or cough. He also denies syncope, wheezing, palpitations, leg swelling, abdominal pain, nausea or vomiting. He has had 324mg  ASA x 2 as well as 2 SL nitro which improved his pain. Exertion does not make his pain worse. He reports having a stress test years ago but does not see a cardiologist regularly.   HPI  Past Medical History:  Diagnosis Date  . Cancer [facility])    prostate  . Diabetes mellitus   . GERD (gastroesophageal reflux disease)   . Hemorrhoids   . Hypertension    PCP [name] at [place]  . PONV (postoperative nausea and vomiting)   . Thyroid disease     Patient Active Problem List   Diagnosis Date Noted  . Incontinence of urine 09/29/2013    Past Surgical History:  Procedure Laterality Date  . APPENDECTOMY    . colonoscopy with polypectomy    . CYSTOSCOPY N/A 09/29/2013   Procedure: CYSTOSCOPY;  Surgeon: [name], MD;  Location: [facility];  Service: Urology;  Laterality: N/A;  . HERNIA REPAIR     inguinal bilaterally  . ROBOT ASSISTED LAPAROSCOPIC RADICAL PROSTATECTOMY  06/25/2011   Procedure: ROBOTIC ASSISTED LAPAROSCOPIC RADICAL PROSTATECTOMY LEVEL 2;  Surgeon: [name], MD;  Location: [facility];  Service: Urology;  Laterality: N/A;  . URETHRAL SLING N/A 09/29/2013   Procedure: MALE SLING;  Surgeon: [name], MD;   Location: [facility];  Service: Urology;  Laterality: N/A;       Home Medications    Prior to Admission medications   Medication Sig Start Date End Date Taking? Authorizing Provider  cephALEXin (KEFLEX) 500 MG capsule Take 1 capsule (500 mg total) by mouth 4 (four) times daily. 10/13/13   April Palumbo, MD  ciprofloxacin (CIPRO) 250 MG tablet Take 1 tablet (250 mg total) by mouth 2 (two) times daily. 09/29/13   Debbrah Alar, PA-C  HYDROcodone-acetaminophen (NORCO/VICODIN) 5-325 MG per tablet Take 1-2 tablets by mouth every 6 (six) hours as needed for moderate pain.    Historical Provider, MD  hydrocortisone (ANUSOL-HC) 2.5 % rectal cream Place 1 application rectally 2 (two) times daily as needed for hemorrhoids or itching.    Historical Provider, MD  insulin glargine (LANTUS) 100 UNIT/ML injection Inject 17 Units into the skin every morning.    Historical Provider, MD  insulin lispro (HUMALOG) 100 UNIT/ML injection Inject 1-10 Units into the skin 3 (three) times daily before meals. 8 units and if bs is greater than 140 will increase 1 unit more for every 40. If bs is below 140 may decrease by 1 unit    Historical Provider, MD  lisinopril (PRINIVIL,ZESTRIL) 20 MG tablet Take 20 mg by mouth every morning.    Historical Provider, MD  nystatin cream (MYCOSTATIN) Apply 1  application topically 2 (two) times daily as needed for dry skin.    Historical Provider, MD  omeprazole (PRILOSEC) 20 MG capsule Take 20 mg by mouth daily.    Historical Provider, MD    Family History History reviewed. No pertinent family history.  Social History Social History  Substance Use Topics  . Smoking status: Never Smoker  . Smokeless tobacco: Never Used  . Alcohol use No     Allergies   Sulfa antibiotics   Review of Systems Review of Systems  Constitutional: Negative for chills and fever.  Respiratory: Positive for shortness of breath. Negative for cough and wheezing.   Cardiovascular: Positive for chest pain.  Negative for palpitations and leg swelling.  Gastrointestinal: Negative for abdominal pain, nausea and vomiting.  Neurological: Positive for light-headedness. Negative for syncope.  All other systems reviewed and are negative.    Physical Exam Updated Vital Signs BP (!) 154/82 (BP Location: Right Arm)   Pulse 71   Temp 97.6 F (36.4 C) (Oral)   Resp (!) 21   Ht 5\' 9"  (1.753 m)   Wt 81.2 kg   SpO2 96%   BMI 26.43 kg/m   Physical Exam  Constitutional: He is oriented to person, place, and time. He appears well-developed and well-nourished. No distress.  HENT:  Head: Normocephalic and atraumatic.  Eyes: Conjunctivae are normal. Pupils are equal, round, and reactive to light. Right eye exhibits no discharge. Left eye exhibits no discharge. No scleral icterus.  Neck: Normal range of motion.  Cardiovascular: Normal rate and regular rhythm.  Exam reveals no gallop and no friction rub.   No murmur heard. Pulmonary/Chest: Effort normal and breath sounds normal. No respiratory distress. He has no wheezes. He has no rales. He exhibits tenderness (mildly tender to palpation).  Abdominal: Soft. Bowel sounds are normal. He exhibits no distension and no mass. There is no tenderness. There is no rebound and no guarding. No hernia.  Well-healed surgical scars present  Neurological: He is alert and oriented to person, place, and time.  Skin: Skin is warm and dry.  Psychiatric: He has a normal mood and affect. His behavior is normal.  Nursing note and vitals reviewed.    ED Treatments / Results  Labs (all labs ordered are listed, but only abnormal results are displayed) Labs Reviewed  BASIC METABOLIC PANEL - Abnormal; Notable for the following:       Result Value   CO2 20 (*)    Glucose, Bld 189 (*)    Creatinine, Ser 1.63 (*)    Calcium 8.8 (*)    GFR calc non Af Amer 39 (*)    GFR calc Af Amer 45 (*)    All other components within normal limits  I-STAT TROPOININ, ED - Abnormal;  Notable for the following:    Troponin i, poc 0.11 (*)    All other components within normal limits  CBC  HEPARIN LEVEL (UNFRACTIONATED)    EKG  EKG Interpretation  Date/Time:  Saturday August 25 2016 10:29:37 EDT Ventricular Rate:  75 PR Interval:    QRS Duration: 98 QT Interval:  410 QTC Calculation: 458 R Axis:   -2 Text Interpretation:  Sinus rhythm Minimal ST elevation, anterior leads Confirmed by RAY MD, Andee Poles [id number]) on 08/25/2016 10:58:06 AM       Radiology Dg Chest 2 View  Result Date: 08/25/2016 CLINICAL DATA:  76 year old male with a history of chest pain. EXAM: CHEST  2 VIEW COMPARISON:  06/26/2014 FINDINGS: Cardiomediastinal silhouette  unchanged. Double density in the low mediastinum is unchanged. Coarsened interstitial markings bilaterally. No pleural effusion or pneumothorax. No confluent airspace disease. No displaced fracture. New mid thoracic compression fracture, likely T9, of indeterminate age. Less than 30% anterior height loss. IMPRESSION: Chronic lung changes with no evidence of acute cardiopulmonary disease. Unchanged hiatal hernia. Lateral view demonstrates a new compression fracture, likely T9 with less than 30% anterior height loss, of indeterminate age. If the patient has focal thoracic back pain at this level, consider MRI. Electronically Signed   By: Corrie Mckusick D.O.   On: 08/25/2016 10:55    Procedures Procedures (including critical care time)  Medications Ordered in ED Medications  heparin ADULT infusion 100 units/mL (25000 units/282mL sodium chloride 0.45%) (1,000 Units/hr Intravenous New Bag/Given 08/25/16 1138)  heparin bolus via infusion 4,000 Units (4,000 Units Intravenous Bolus from Bag 08/25/16 1138)     Initial Impression / Assessment and Plan / ED Course  I have reviewed the triage vital signs and the nursing notes.  Pertinent labs & imaging results that were available during my care of the patient were reviewed by me and considered  in my medical decision making (see chart for details).  76 year old male with NSTEMI. He is hypertensive, otherwise vitals are normal and he is in NAD. CBC unremarkable. BMP remarkable for hyperglycemia (189), elevated SCr (1.63), hypocalcemia. It appears his baseline SCr is 1.4-1.6. EKG is NSR. Initial trop is 0.11. CXR remarkable for T9 compression fracture. Pt denies back pain. Spoke with Dr. Aundra Dubin who will evaluate patient.  Final Clinical Impressions(s) / ED Diagnoses   Final diagnoses:  NSTEMI (non-ST elevated myocardial infarction) [facility])    New Prescriptions New Prescriptions   No medications on file     Recardo Evangelist, PA-C 08/25/16 [place], MD 08/27/16 [phone]

## 2016-08-25 NOTE — Progress Notes (Signed)
[facility] for heparin Indication: chest pain/ACS  Allergies  Allergen Reactions  . Naproxen Sodium Shortness Of Breath  . Tramadol Other (See Comments)    dizziness  . Sulfa Antibiotics Rash   Patient Measurements: Height: 5\' 9"  (175.3 cm) Weight: 179 lb (81.2 kg) IBW/kg (Calculated) : 70.7 Heparin Dosing Weight: 81 kg  Assessment: 76 yo M presents on 4/28 with CP. Pharmacy consulted to start heparin gtt. No anticoag PTA. Troponin slightly elevated at 0.11. CBC stable.  Initial heparin level = 0.32  Goal of Therapy:  Heparin level 0.3-0.7 units/ml Monitor platelets by anticoagulation protocol: Yes   Plan:  Increase heparin to 1100 units / hr Follow up AM labs  Thank you Anette Guarneri, PharmD [phone] 08/25/2016 7:55 PM

## 2016-08-25 NOTE — ED Triage Notes (Signed)
On arrival to room B-18 Pt A/O and speaking in full sentences. Pt reported CP started 1 HR ago on Lt side and radiated into Lt arm. Pt took 324 mg of ASA and then called 911. Pt was instructed to take an additional 324 mg ASA by 911 . Pt received 2 NGT SL for CP from EMS CP was 9/10 and in response to NGT CP now 5/10.

## 2016-08-25 NOTE — H&P (Signed)
Patient ID: Victor Gould MRN: [id number], DOB/AGE: June 21, 1940   Admit date: 08/25/2016  Primary Physician: Rusty Aus, MD Primary Cardiologist: New patient  Problem List  Past Medical History:  Diagnosis Date  . Cancer [facility])    prostate  . Diabetes mellitus   . GERD (gastroesophageal reflux disease)   . Hemorrhoids   . Hypertension    PCP [name] at [place]  . PONV (postoperative nausea and vomiting)   . Thyroid disease     Past Surgical History:  Procedure Laterality Date  . APPENDECTOMY    . colonoscopy with polypectomy    . CYSTOSCOPY N/A 09/29/2013   Procedure: CYSTOSCOPY;  Surgeon: [name], MD;  Location: [facility];  Service: Urology;  Laterality: N/A;  . HERNIA REPAIR     inguinal bilaterally  . ROBOT ASSISTED LAPAROSCOPIC RADICAL PROSTATECTOMY  06/25/2011   Procedure: ROBOTIC ASSISTED LAPAROSCOPIC RADICAL PROSTATECTOMY LEVEL 2;  Surgeon: [name], MD;  Location: [facility];  Service: Urology;  Laterality: N/A;  . URETHRAL SLING N/A 09/29/2013   Procedure: MALE SLING;  Surgeon: [name], MD;  Location: [facility];  Service: Urology;  Laterality: N/A;     Allergies  Allergies  Allergen Reactions  . Sulfa Antibiotics Rash    HPI  Patient is a 76 y.o. male with a PMHx of IDDM, CKD stage 3, HTN, GERD, HLP who presented this am with chest pain.  The pain started at 8 AM while he was sitting at his computer. It is on the L side of his chest and radiates to the L arm. Feels like a squeezing and is constant. Reports associated SOB and lightheadedness. He states that he has felt similar pain before when he had pneumonia several years ago however he denies fever or cough. He also denies syncope, wheezing, palpitations, leg swelling, abdominal pain, nausea or vomiting. He has had 324mg  ASA x 2 as well as 2 SL nitro which improved his pain. Exertion does not make his pain worse. He reports having a stress test years ago but does not see a cardiologist  regularly. The patient is currently CP free. He is usually very active and asymptomatic.   Labs: Crea 1.6, baseline 1.5-1.7 Troponin 0.11  Home Medications  Prior to Admission medications   Medication Sig Start Date End Date Taking? Authorizing Provider  cephALEXin (KEFLEX) 500 MG capsule Take 1 capsule (500 mg total) by mouth 4 (four) times daily. 10/13/13   April Palumbo, MD  ciprofloxacin (CIPRO) 250 MG tablet Take 1 tablet (250 mg total) by mouth 2 (two) times daily. 09/29/13   Debbrah Alar, PA-C  HYDROcodone-acetaminophen (NORCO/VICODIN) 5-325 MG per tablet Take 1-2 tablets by mouth every 6 (six) hours as needed for moderate pain.    Historical Provider, MD  hydrocortisone (ANUSOL-HC) 2.5 % rectal cream Place 1 application rectally 2 (two) times daily as needed for hemorrhoids or itching.    Historical Provider, MD  insulin glargine (LANTUS) 100 UNIT/ML injection Inject 17 Units into the skin every morning.    Historical Provider, MD  insulin lispro (HUMALOG) 100 UNIT/ML injection Inject 1-10 Units into the skin 3 (three) times daily before meals. 8 units and if bs is greater than 140 will increase 1 unit more for every 40. If bs is below 140 may decrease by 1 unit    Historical Provider, MD  lisinopril (PRINIVIL,ZESTRIL) 20 MG tablet Take 20 mg by mouth every morning.    Historical Provider, MD  nystatin cream (MYCOSTATIN) Apply 1 application topically 2 (two) times daily as needed for dry skin.    Historical Provider, MD  omeprazole (PRILOSEC) 20 MG capsule Take 20 mg by mouth daily.    Historical Provider, MD    Family History  No FH of premature CAD or SCD.  Social History  Social History   Social History  . Marital status: Married    Spouse name: N/A  . Number of children: N/A  . Years of education: N/A   Occupational History  . Not on file.   Social History Main Topics  . Smoking status: Never Smoker  . Smokeless tobacco: Never Used  . Alcohol use No  . Drug use: No    . Sexual activity: Not on file   Other Topics Concern  . Not on file   Social History Narrative  . No narrative on file     Review of Systems General:  No chills, fever, night sweats or weight changes.  Cardiovascular:  No chest pain, dyspnea on exertion, edema, orthopnea, palpitations, paroxysmal nocturnal dyspnea. Dermatological: No rash, lesions/masses Respiratory: No cough, dyspnea Urologic: No hematuria, dysuria Abdominal:   No nausea, vomiting, diarrhea, bright red blood per rectum, melena, or hematemesis Neurologic:  No visual changes, wkns, changes in mental status. All other systems reviewed and are otherwise negative except as noted above.  Physical Exam  Blood pressure (!) 151/85, pulse 74, temperature 97.6 F (36.4 C), temperature source Oral, resp. rate 19, height 5\' 9"  (1.753 m), weight 179 lb (81.2 kg), SpO2 97 %.  General: Pleasant, NAD Psych: Normal affect. Neuro: Alert and oriented X 3. Moves all extremities spontaneously. HEENT: Normal  Neck: Supple without bruits or JVD. Lungs:  Resp regular and unlabored, CTA. Heart: RRR no s3, s4, or murmurs. Abdomen: Soft, non-tender, non-distended, BS + x 4.  Extremities: No clubbing, cyanosis or edema. DP/PT/Radials 2+ and equal bilaterally.  Labs  No results for input(s): CKTOTAL, CKMB, TROPONINI in the last 72 hours. Lab Results  Component Value Date   WBC 7.5 08/25/2016   HGB 14.6 08/25/2016   HCT 42.4 08/25/2016   MCV 89.3 08/25/2016   PLT 252 08/25/2016    Recent Labs Lab 08/25/16 1036  NA 136  K 4.0  CL 108  CO2 20*  BUN 20  CREATININE 1.63*  CALCIUM 8.8*  GLUCOSE 189*   No results found for: CHOL, HDL, LDLCALC, TRIG No results found for: DDIMER Invalid input(s): POCBNP   Radiology/Studies  Dg Chest 2 View  Result Date: 08/25/2016 CLINICAL DATA:  76 year old male with a history of chest pain. EXAM: CHEST  2 VIEW COMPARISON:  06/26/2014 FINDINGS: Cardiomediastinal silhouette unchanged.  Double density in the low mediastinum is unchanged. Coarsened interstitial markings bilaterally. No pleural effusion or pneumothorax. No confluent airspace disease. No displaced fracture. New mid thoracic compression fracture, likely T9, of indeterminate age. Less than 30% anterior height loss. IMPRESSION: Chronic lung changes with no evidence of acute cardiopulmonary disease. Unchanged hiatal hernia. Lateral view demonstrates a new compression fracture, likely T9 with less than 30% anterior height loss, of indeterminate age. If the patient has focal thoracic back pain at this level, consider MRI. Electronically Signed   By: Corrie Mckusick D.O.   On: 08/25/2016 10:55   Echocardiogram - none  ECG: SR, otherwise normal, personally reviewed    ASSESSMENT AND PLAN  1. NSTEMI - minimal troponin elevation in the settings of stage 3 CKD, but with chest pain with some typical and  atypical features - ECG normal - We will continue checking troponin, if flat trend --> exercise nuclear stress test, if trending up, hydrate and schedule for a cath on Monday, he is considered high risk given his age, sex and IDDDM - Admit to telemetry - continue iv heparin  - start asa 81 mg po daily and atorvastatin 80 mg po daily - start carvedilol 3.125 mg po BID, hold lisinopril - order echocardiogram  2. Hypertension  - start carvedilol 3.125 mg po BID, hold lisinopril  3. Lipids  - we will check  DVT PPX - iv Heparin   Signed, Ena Dawley, MD, [facility] 08/25/2016, 2:37 PM

## 2016-08-25 NOTE — Progress Notes (Signed)
Critical Value - Troponin = 3.67. Dr. Koleen Nimrod notified via text messaging.

## 2016-08-25 NOTE — Progress Notes (Signed)
ANTICOAGULATION CONSULT NOTE - Initial Consult  Pharmacy Consult for heparin Indication: chest pain/ACS  Allergies  Allergen Reactions  . Sulfa Antibiotics Rash   Patient Measurements: Height: 5\' 9"  (175.3 cm) Weight: 179 lb (81.2 kg) IBW/kg (Calculated) : 70.7 Heparin Dosing Weight: 81 kg  Assessment: 76 yo M presents on 4/28 with CP. Pharmacy consulted to start heparin gtt. No anticoag PTA. Troponin slightly elevated at 0.11. CBC stable.  Goal of Therapy:  Heparin level 0.3-0.7 units/ml Monitor platelets by anticoagulation protocol: Yes   Plan:  Give heparin 4,000 unit bolus Start heparin gtt at 1,000 units/hr Check 8 hr heparin level Monitor daily heparin level, CBC, s/s of bleed  Elenor Quinones, PharmD, [facility] Clinical Pharmacist Pager [phone] 08/25/2016 11:04 AM

## 2016-08-25 NOTE — ED Notes (Signed)
Gave patient a urinal pt on side of bed using it

## 2016-08-25 NOTE — Progress Notes (Signed)
CRITICAL VALUE ALERT  Critical value received:  Troponin 9.32  Date of notification:  08/25/2016  Time of notification:  Not notified by lab  Critical value read back:  No  Nurse who received alert:  Ronal Fear RN noticed lab report in chart  MD notified (1st page): Koleen Nimrod (cardiology fellow) 5pm-7am   Time of first page:  2303  MD notified (2nd page):  Time of second page:  Responding MD:  Koleen Nimrod  Time MD responded:  2304, no new orders

## 2016-08-26 DIAGNOSIS — Z951 Presence of aortocoronary bypass graft: Secondary | ICD-10-CM | POA: Diagnosis not present

## 2016-08-26 DIAGNOSIS — E876 Hypokalemia: Secondary | ICD-10-CM | POA: Diagnosis not present

## 2016-08-26 DIAGNOSIS — Z79899 Other long term (current) drug therapy: Secondary | ICD-10-CM | POA: Diagnosis not present

## 2016-08-26 DIAGNOSIS — D62 Acute posthemorrhagic anemia: Secondary | ICD-10-CM | POA: Diagnosis not present

## 2016-08-26 DIAGNOSIS — I4891 Unspecified atrial fibrillation: Secondary | ICD-10-CM | POA: Diagnosis not present

## 2016-08-26 DIAGNOSIS — E785 Hyperlipidemia, unspecified: Secondary | ICD-10-CM | POA: Diagnosis present

## 2016-08-26 DIAGNOSIS — E1022 Type 1 diabetes mellitus with diabetic chronic kidney disease: Secondary | ICD-10-CM | POA: Diagnosis present

## 2016-08-26 DIAGNOSIS — I25110 Atherosclerotic heart disease of native coronary artery with unstable angina pectoris: Secondary | ICD-10-CM | POA: Diagnosis not present

## 2016-08-26 DIAGNOSIS — I2510 Atherosclerotic heart disease of native coronary artery without angina pectoris: Secondary | ICD-10-CM | POA: Diagnosis present

## 2016-08-26 DIAGNOSIS — I5032 Chronic diastolic (congestive) heart failure: Secondary | ICD-10-CM | POA: Diagnosis present

## 2016-08-26 DIAGNOSIS — K219 Gastro-esophageal reflux disease without esophagitis: Secondary | ICD-10-CM | POA: Diagnosis present

## 2016-08-26 DIAGNOSIS — I214 Non-ST elevation (NSTEMI) myocardial infarction: Secondary | ICD-10-CM | POA: Diagnosis present

## 2016-08-26 DIAGNOSIS — Z8546 Personal history of malignant neoplasm of prostate: Secondary | ICD-10-CM | POA: Diagnosis not present

## 2016-08-26 DIAGNOSIS — E1065 Type 1 diabetes mellitus with hyperglycemia: Secondary | ICD-10-CM | POA: Diagnosis present

## 2016-08-26 DIAGNOSIS — R079 Chest pain, unspecified: Secondary | ICD-10-CM | POA: Diagnosis present

## 2016-08-26 DIAGNOSIS — E079 Disorder of thyroid, unspecified: Secondary | ICD-10-CM | POA: Diagnosis present

## 2016-08-26 DIAGNOSIS — N183 Chronic kidney disease, stage 3 (moderate): Secondary | ICD-10-CM | POA: Diagnosis present

## 2016-08-26 DIAGNOSIS — G4700 Insomnia, unspecified: Secondary | ICD-10-CM | POA: Diagnosis not present

## 2016-08-26 DIAGNOSIS — I130 Hypertensive heart and chronic kidney disease with heart failure and stage 1 through stage 4 chronic kidney disease, or unspecified chronic kidney disease: Secondary | ICD-10-CM | POA: Diagnosis present

## 2016-08-26 DIAGNOSIS — Z794 Long term (current) use of insulin: Secondary | ICD-10-CM | POA: Diagnosis not present

## 2016-08-26 LAB — CBC
HEMATOCRIT: 42.4 % (ref 39.0–52.0)
Hemoglobin: 14.4 g/dL (ref 13.0–17.0)
MCH: 30.4 pg (ref 26.0–34.0)
MCHC: 34 g/dL (ref 30.0–36.0)
MCV: 89.6 fL (ref 78.0–100.0)
Platelets: 233 10*3/uL (ref 150–400)
RBC: 4.73 MIL/uL (ref 4.22–5.81)
RDW: 13.6 % (ref 11.5–15.5)
WBC: 9.3 10*3/uL (ref 4.0–10.5)

## 2016-08-26 LAB — LIPID PANEL
CHOL/HDL RATIO: 3.7 ratio
CHOLESTEROL: 164 mg/dL (ref 0–200)
HDL: 44 mg/dL (ref >40)
LDL Cholesterol: 103 mg/dL — ABNORMAL HIGH (ref 0–99)
Triglycerides: 84 mg/dL (ref <150)
VLDL: 17 mg/dL (ref 0–40)

## 2016-08-26 LAB — BASIC METABOLIC PANEL
Anion gap: 9 (ref 5–15)
BUN: 17 mg/dL (ref 6–20)
CALCIUM: 8.9 mg/dL (ref 8.9–10.3)
CHLORIDE: 107 mmol/L (ref 101–111)
CO2: 21 mmol/L — AB (ref 22–32)
Creatinine, Ser: 1.54 mg/dL — ABNORMAL HIGH (ref 0.61–1.24)
GFR calc Af Amer: 48 mL/min — ABNORMAL LOW (ref >60)
GFR calc non Af Amer: 42 mL/min — ABNORMAL LOW (ref >60)
GLUCOSE: 137 mg/dL — AB (ref 65–99)
Potassium: 4 mmol/L (ref 3.5–5.1)
Sodium: 137 mmol/L (ref 135–145)

## 2016-08-26 LAB — GLUCOSE, CAPILLARY
Glucose-Capillary: 150 mg/dL — ABNORMAL HIGH (ref 65–99)
Glucose-Capillary: 210 mg/dL — ABNORMAL HIGH (ref 65–99)
Glucose-Capillary: 156 mg/dL — ABNORMAL HIGH (ref 65–99)
Glucose-Capillary: 262 mg/dL — ABNORMAL HIGH (ref 65–99)

## 2016-08-26 LAB — HEPARIN LEVEL (UNFRACTIONATED): Heparin Unfractionated: 0.33 IU/mL (ref 0.30–0.70)

## 2016-08-26 LAB — TROPONIN I: Troponin I: 11.86 ng/mL (ref <0.03)

## 2016-08-26 MED ORDER — INSULIN GLARGINE 100 UNIT/ML ~~LOC~~ SOLN: 17.0000 [IU] | Freq: Every day | SUBCUTANEOUS | Status: DC

## 2016-08-26 MED ORDER — INSULIN GLARGINE 100 UNITS/ML SOLOSTAR PEN: 17.0000 [IU] | PEN_INJECTOR | Freq: Every day | SUBCUTANEOUS | Status: DC

## 2016-08-26 MED ORDER — CARVEDILOL 12.5 MG PO TABS
12.5000 mg | ORAL_TABLET | Freq: Two times a day (BID) | ORAL | Status: DC
  Administered 2016-08-26 – 2016-08-27 (×3): 12.5 mg via ORAL
  Filled 2016-08-26 (×3): qty 1

## 2016-08-26 MED ORDER — CARVEDILOL 6.25 MG PO TABS: 6.2500 mg | ORAL_TABLET | Freq: Two times a day (BID) | ORAL | Status: DC

## 2016-08-26 MED ORDER — SODIUM CHLORIDE 0.9% FLUSH
3.0000 mL | Freq: Two times a day (BID) | INTRAVENOUS | Status: DC
  Administered 2016-08-26: 3 mL via INTRAVENOUS

## 2016-08-26 MED ORDER — SODIUM CHLORIDE 0.9 % IV SOLN
250.0000 mL | INTRAVENOUS | Status: DC | PRN
Start: 2016-08-26 — End: 2016-08-27

## 2016-08-26 MED ORDER — ASPIRIN 81 MG PO CHEW
81.0000 mg | CHEWABLE_TABLET | ORAL | Status: AC
  Administered 2016-08-27: 81 mg via ORAL
  Filled 2016-08-26: qty 1

## 2016-08-26 MED ORDER — ATORVASTATIN CALCIUM 80 MG PO TABS
80.0000 mg | ORAL_TABLET | Freq: Every day | ORAL | Status: DC
  Administered 2016-08-29: 80 mg via ORAL
  Filled 2016-08-26 (×2): qty 1

## 2016-08-26 MED ORDER — HEPARIN (PORCINE) IN NACL 100-0.45 UNIT/ML-% IJ SOLN
1150.0000 [IU]/h | INTRAMUSCULAR | Status: DC
  Administered 2016-08-26: 1150 [IU]/h via INTRAVENOUS

## 2016-08-26 MED ORDER — SODIUM CHLORIDE 0.9% FLUSH: 3.0000 mL | INTRAVENOUS | Status: DC | PRN

## 2016-08-26 MED ORDER — INSULIN GLARGINE 100 UNIT/ML ~~LOC~~ SOLN
17.0000 [IU] | SUBCUTANEOUS | Status: DC
  Administered 2016-08-26 – 2016-08-27 (×2): 17 [IU] via SUBCUTANEOUS
  Filled 2016-08-26 (×2): qty 0.17

## 2016-08-26 MED ORDER — SODIUM CHLORIDE 0.9 % IV SOLN
INTRAVENOUS | Status: DC
  Administered 2016-08-26 – 2016-08-27 (×2): via INTRAVENOUS

## 2016-08-26 NOTE — Progress Notes (Signed)
[facility] for heparin Indication: chest pain/ACS  Allergies  Allergen Reactions  . Naproxen Sodium Shortness Of Breath  . Tramadol Other (See Comments)    dizziness  . Sulfa Antibiotics Rash   Patient Measurements: Height: 5\' 9"  (175.3 cm) Weight: 175 lb 9.6 oz (79.7 kg) IBW/kg (Calculated) : 70.7 Heparin Dosing Weight: 81 kg  Assessment: 76 yo M presents on 4/28 with CP. Pharmacy consulted to start heparin gtt. No anticoag PTA.   HL remains at low end of therapeutic at 0.33. Troponin continues to trend up. CBC stable. No bleeding issues noted.   Goal of Therapy:  Heparin level 0.3-0.7 units/ml Monitor platelets by anticoagulation protocol: Yes   Plan:  - Increase heparin slightly to 1150 units / hr in attempt to maintain in goal range - Daily HL, CBC - F/u LHC on Monday   Analiza Cowger K. Velva Harman, PharmD, BCPS, CPP Clinical Pharmacist Pager: [phone] Phone: [phone] 08/26/2016 10:53 AM

## 2016-08-26 NOTE — Progress Notes (Signed)
CRITICAL VALUE ALERT  Critical value received:  Troponin 11.86  Date of notification:  08/26/2016  Time of notification:  No call from lab  Critical value read back: None  Nurse who received alert:  RN noticed lab value  MD notified (1st page):  Koleen Nimrod  Time of first page:  [phone]  MD notified (2nd page): None, MD aware of uptrending trops  Time of second page:None  Responding MD:  None at this time  Time MD responded:  None at this time

## 2016-08-26 NOTE — Plan of Care (Signed)
Problem: Education: Goal: Understanding of CV disease, CV risk reduction, and recovery process will improve Outcome: Progressing Pt and family viewed Cath video.

## 2016-08-26 NOTE — Progress Notes (Signed)
Progress Note  Patient Name: Victor Gould Date of Encounter: 08/26/2016  Primary Cardiologist: Dr. Meda Coffee  Subjective   Denies any CP since yesterday morning, denies any SOB.   Inpatient Medications    Scheduled Meds: . amLODipine  5 mg Oral QPC supper  . aspirin EC  81 mg Oral QPC breakfast  . B-complex with vitamin C  1 tablet Oral Daily  . carvedilol  3.125 mg Oral BID WC  . cholecalciferol  1,000 Units Oral QPC breakfast  . insulin aspart  0-15 Units Subcutaneous TID WC  . levothyroxine  75 mcg Oral QAC breakfast  . multivitamin with minerals  1 tablet Oral QPC breakfast  . pantoprazole  40 mg Oral Daily   Continuous Infusions: . heparin 1,100 Units/hr (08/26/16 0506)   PRN Meds: acetaminophen, nitroGLYCERIN, ondansetron (ZOFRAN) IV   Vital Signs    Vitals:   08/25/16 1638 08/25/16 1900 08/25/16 2009 08/26/16 0435  BP: (!) 173/84  (!) 154/74 134/70  Pulse: 78 70 70 69  Resp: 19  20 15   Temp: 97.6 F (36.4 C)  98 F (36.7 C) 97.7 F (36.5 C)  TempSrc: Oral Oral Oral Oral  SpO2: 96%  95% 95%  Weight:    175 lb 9.6 oz (79.7 kg)  Height:        Intake/Output Summary (Last 24 hours) at 08/26/16 0833 Last data filed at 08/26/16 0506  Gross per 24 hour  Intake            403.5 ml  Output             1325 ml  Net           -921.5 ml   Filed Weights   08/25/16 1024 08/26/16 0435  Weight: 179 lb (81.2 kg) 175 lb 9.6 oz (79.7 kg)    Telemetry    NSR without ventricular ectopy - Personally Reviewed  ECG    NSR with evolving TWI in anterior leads - Personally Reviewed  Physical Exam   GEN: No acute distress.   Neck: No JVD Cardiac: RRR, no murmurs, rubs, or gallops.  Respiratory: Clear to auscultation bilaterally. GI: Soft, nontender, non-distended  MS: No edema; No deformity. Neuro:  Nonfocal  Psych: Normal affect   Labs    Chemistry Recent Labs Lab 08/25/16 1036 08/26/16 0449  NA 136 137  K 4.0 4.0  CL 108 107  CO2 20* 21*    GLUCOSE 189* 137*  BUN 20 17  CREATININE 1.63* 1.54*  CALCIUM 8.8* 8.9  GFRNONAA 39* 42*  GFRAA 45* 48*  ANIONGAP 8 9     Hematology Recent Labs Lab 08/25/16 1036 08/26/16 0449  WBC 7.5 9.3  RBC 4.75 4.73  HGB 14.6 14.4  HCT 42.4 42.4  MCV 89.3 89.6  MCH 30.7 30.4  MCHC 34.4 34.0  RDW 14.0 13.6  PLT 252 233    Cardiac Enzymes Recent Labs Lab 08/25/16 1609 08/25/16 2122 08/26/16 0449  TROPONINI 3.67* 9.32* 11.86*    Recent Labs Lab 08/25/16 1040  TROPIPOC 0.11*     BNPNo results for input(s): BNP, PROBNP in the last 168 hours.   DDimer No results for input(s): DDIMER in the last 168 hours.   Radiology    Dg Chest 2 View  Result Date: 08/25/2016 CLINICAL DATA:  76 year old male with a history of chest pain. EXAM: CHEST  2 VIEW COMPARISON:  06/26/2014 FINDINGS: Cardiomediastinal silhouette unchanged. Double density in the low mediastinum is unchanged. Coarsened  interstitial markings bilaterally. No pleural effusion or pneumothorax. No confluent airspace disease. No displaced fracture. New mid thoracic compression fracture, likely T9, of indeterminate age. Less than 30% anterior height loss. IMPRESSION: Chronic lung changes with no evidence of acute cardiopulmonary disease. Unchanged hiatal hernia. Lateral view demonstrates a new compression fracture, likely T9 with less than 30% anterior height loss, of indeterminate age. If the patient has focal thoracic back pain at this level, consider MRI. Electronically Signed   By: Corrie Mckusick D.O.   On: 08/25/2016 10:55    Cardiac Studies   Pending echo and cath  Patient Profile     76 y.o. male with PMH of IDDM, CKD stage III, HTN, HLD, GERD who presented with chest pain. Trop went up to 11 over night.   Assessment & Plan    1. NSTEMI  - trop went up to 11 overnight. Evolving TWI in anterior leads, no obvious ST elevation. Denies any CP since yesterday morning  - Continue IV heparin. Hold off IV nitro unless  recurrent chest pain. Pending echo.   - Plan cardiac catheterization tomorrow. NPO past midnight.   - Risk and benefit of procedure explained to the patient who display clear understanding and agree to proceed. Discussed with patient possible procedural risk include bleeding, vascular injury, renal injury, arrythmia, MI, stroke and loss of limb or life.  2. CKD stage III: Baseline Cr 1.5, will hydrate overnight pending upcoming cath  3. HTN: increase coreg, pending echo, if EF low, will discontinue amlodipine and start ACEI/ARB  4. HLD: LDL level 103, goal < 70, start 80mg  lipitor daily.  Hilbert Corrigan, PA  08/26/2016, 8:33 AM    Patient examined chart reviewed SEMI troponin up to 11 no pain now ECG with anterolateral T wave changes continue heparin cath today if any further pain. Will make note on cath board to move him up on schedule increase beta blocker HR in 90's this am exam benign clear lungs no murmur good right radial pulse  Jenkins Rouge

## 2016-08-27 ENCOUNTER — Inpatient Hospital Stay (HOSPITAL_COMMUNITY): Payer: Medicare Other

## 2016-08-27 ENCOUNTER — Encounter (HOSPITAL_COMMUNITY)
Admission: EM | Disposition: A | Discharge status: Home / Self Care | Payer: Self-pay | Source: Home / Self Care | Attending: Cardiology

## 2016-08-27 ENCOUNTER — Encounter (HOSPITAL_COMMUNITY): Payer: Self-pay | Admitting: Interventional Cardiology

## 2016-08-27 ENCOUNTER — Other Ambulatory Visit: Payer: Self-pay | Admitting: *Deleted

## 2016-08-27 DIAGNOSIS — I25110 Atherosclerotic heart disease of native coronary artery with unstable angina pectoris: Secondary | ICD-10-CM

## 2016-08-27 DIAGNOSIS — I2510 Atherosclerotic heart disease of native coronary artery without angina pectoris: Secondary | ICD-10-CM

## 2016-08-27 DIAGNOSIS — N183 Chronic kidney disease, stage 3 (moderate): Secondary | ICD-10-CM

## 2016-08-27 HISTORY — PX: LEFT HEART CATH AND CORONARY ANGIOGRAPHY: CATH118249

## 2016-08-27 LAB — BLOOD GAS, ARTERIAL
Acid-base deficit: 4.5 mmol/L — ABNORMAL HIGH (ref 0.0–2.0)
Bicarbonate: 19.4 mmol/L — ABNORMAL LOW (ref 20.0–28.0)
Drawn by: 275531
FIO2: 21
O2 Saturation: 92 %
Patient temperature: 98.6
pCO2 arterial: 31.6 mmHg — ABNORMAL LOW (ref 32.0–48.0)
pH, Arterial: 7.405 (ref 7.350–7.450)
pO2, Arterial: 65.3 mmHg — ABNORMAL LOW (ref 83.0–108.0)

## 2016-08-27 LAB — BASIC METABOLIC PANEL WITH GFR
Anion gap: 10 (ref 5–15)
BUN: 21 mg/dL — ABNORMAL HIGH (ref 6–20)
CO2: 21 mmol/L — ABNORMAL LOW (ref 22–32)
Calcium: 8.7 mg/dL — ABNORMAL LOW (ref 8.9–10.3)
Chloride: 106 mmol/L (ref 101–111)
Creatinine, Ser: 1.62 mg/dL — ABNORMAL HIGH (ref 0.61–1.24)
GFR calc Af Amer: 46 mL/min — ABNORMAL LOW
GFR calc non Af Amer: 39 mL/min — ABNORMAL LOW
Glucose, Bld: 166 mg/dL — ABNORMAL HIGH (ref 65–99)
Potassium: 3.9 mmol/L (ref 3.5–5.1)
Sodium: 137 mmol/L (ref 135–145)

## 2016-08-27 LAB — PROTIME-INR
INR: 1.13
PROTHROMBIN TIME: 14.6 s (ref 11.4–15.2)

## 2016-08-27 LAB — URINALYSIS, ROUTINE W REFLEX MICROSCOPIC
Bilirubin Urine: NEGATIVE
Glucose, UA: >=500 mg/dL — AB
Hgb urine dipstick: NEGATIVE
Ketones, ur: NEGATIVE mg/dL
Leukocytes, UA: NEGATIVE
Nitrite: NEGATIVE
Protein, ur: NEGATIVE mg/dL
Specific Gravity, Urine: 1.032 — ABNORMAL HIGH (ref 1.005–1.030)
pH: 5 (ref 5.0–8.0)

## 2016-08-27 LAB — PULMONARY FUNCTION TEST
FEF 25-75 Post: 3.06 L/sec
FEF 25-75 Pre: 3.66 L/sec
FEF2575-%Change-Post: -16 %
FEF2575-%Pred-Post: 153 %
FEF2575-%Pred-Pre: 183 %
FEV1-%Change-Post: -1 %
FEV1-%Pred-Post: 96 %
FEV1-%Pred-Pre: 97 %
FEV1-Post: 2.72 L
FEV1-Pre: 2.77 L
FEV1FVC-%Change-Post: 1 %
FEV1FVC-%Pred-Pre: 116 %
FEV6-%Change-Post: -2 %
FEV6-%Pred-Post: 86 %
FEV6-%Pred-Pre: 89 %
FEV6-Post: 3.2 L
FEV6-Pre: 3.3 L
FEV6FVC-%Pred-Post: 107 %
FEV6FVC-%Pred-Pre: 107 %
FVC-%Change-Post: -2 %
FVC-%Pred-Post: 80 %
FVC-%Pred-Pre: 83 %
FVC-Post: 3.2 L
FVC-Pre: 3.3 L
Post FEV1/FVC ratio: 85 %
Post FEV6/FVC ratio: 100 %
Pre FEV1/FVC ratio: 84 %
Pre FEV6/FVC Ratio: 100 %

## 2016-08-27 LAB — CBC
HCT: 40.9 % (ref 39.0–52.0)
Hemoglobin: 13.8 g/dL (ref 13.0–17.0)
MCH: 30.5 pg (ref 26.0–34.0)
MCHC: 33.7 g/dL (ref 30.0–36.0)
MCV: 90.3 fL (ref 78.0–100.0)
PLATELETS: 202 10*3/uL (ref 150–400)
RBC: 4.53 MIL/uL (ref 4.22–5.81)
RDW: 13.6 % (ref 11.5–15.5)
WBC: 8.1 10*3/uL (ref 4.0–10.5)

## 2016-08-27 LAB — TYPE AND SCREEN
ABO/RH(D): O POS
Antibody Screen: NEGATIVE

## 2016-08-27 LAB — HEPARIN LEVEL (UNFRACTIONATED): HEPARIN UNFRACTIONATED: 0.31 [IU]/mL (ref 0.30–0.70)

## 2016-08-27 LAB — GLUCOSE, CAPILLARY
Glucose-Capillary: 148 mg/dL — ABNORMAL HIGH (ref 65–99)
Glucose-Capillary: 120 mg/dL — ABNORMAL HIGH (ref 65–99)
Glucose-Capillary: 289 mg/dL — ABNORMAL HIGH (ref 65–99)
Glucose-Capillary: 129 mg/dL — ABNORMAL HIGH (ref 65–99)

## 2016-08-27 LAB — ABO/RH: ABO/RH(D): O POS

## 2016-08-27 SURGERY — LEFT HEART CATH AND CORONARY ANGIOGRAPHY
Anesthesia: LOCAL

## 2016-08-27 MED ORDER — TRANEXAMIC ACID (OHS) PUMP PRIME SOLUTION
2.0000 mg/kg | INTRAVENOUS | Status: DC
  Filled 2016-08-27: qty 1.59

## 2016-08-27 MED ORDER — MIDAZOLAM HCL 2 MG/2ML IJ SOLN
INTRAMUSCULAR | Status: DC | PRN
  Administered 2016-08-27: 1 mg via INTRAVENOUS

## 2016-08-27 MED ORDER — PAPAVERINE HCL 30 MG/ML IJ SOLN
INTRAMUSCULAR | Status: AC
  Administered 2016-08-28: 500 mL
  Filled 2016-08-27 (×2): qty 2.5

## 2016-08-27 MED ORDER — ATORVASTATIN CALCIUM 80 MG PO TABS: 80.0000 mg | ORAL_TABLET | Freq: Every day | ORAL | Status: DC

## 2016-08-27 MED ORDER — ACETAMINOPHEN 325 MG PO TABS: 650.0000 mg | ORAL_TABLET | ORAL | Status: DC | PRN

## 2016-08-27 MED ORDER — ASPIRIN 81 MG PO CHEW: 81.0000 mg | CHEWABLE_TABLET | Freq: Every day | ORAL | Status: DC

## 2016-08-27 MED ORDER — HEPARIN (PORCINE) IN NACL 100-0.45 UNIT/ML-% IJ SOLN
1250.0000 [IU]/h | INTRAMUSCULAR | Status: DC
  Administered 2016-08-27: 1150 [IU]/h via INTRAVENOUS
  Filled 2016-08-27: qty 250

## 2016-08-27 MED ORDER — DEXTROSE 5 % IV SOLN
750.0000 mg | INTRAVENOUS | Status: DC
  Filled 2016-08-27 (×2): qty 750

## 2016-08-27 MED ORDER — SODIUM CHLORIDE 0.9 % WEIGHT BASED INFUSION
1.5000 mL/kg/h | INTRAVENOUS | Status: AC
  Administered 2016-08-27: 1.5 mL/kg/h via INTRAVENOUS

## 2016-08-27 MED ORDER — TEMAZEPAM 15 MG PO CAPS: 15.0000 mg | ORAL_CAPSULE | Freq: Once | ORAL | Status: DC | PRN

## 2016-08-27 MED ORDER — SODIUM CHLORIDE 0.9 % IV SOLN
INTRAVENOUS | Status: DC
  Filled 2016-08-27 (×2): qty 30

## 2016-08-27 MED ORDER — BISACODYL 5 MG PO TBEC
5.0000 mg | DELAYED_RELEASE_TABLET | Freq: Once | ORAL | Status: AC
  Administered 2016-08-27: 5 mg via ORAL
  Filled 2016-08-27: qty 1

## 2016-08-27 MED ORDER — LIDOCAINE HCL (PF) 1 % IJ SOLN
INTRAMUSCULAR | Status: DC | PRN
  Administered 2016-08-27: 2 mL

## 2016-08-27 MED ORDER — SODIUM CHLORIDE 0.9 % IV SOLN
INTRAVENOUS | Status: DC
  Filled 2016-08-27: qty 40

## 2016-08-27 MED ORDER — SODIUM CHLORIDE 0.9% FLUSH: 3.0000 mL | INTRAVENOUS | Status: DC | PRN

## 2016-08-27 MED ORDER — NITROGLYCERIN IN D5W 200-5 MCG/ML-% IV SOLN
2.0000 ug/min | INTRAVENOUS | Status: AC
  Administered 2016-08-28: 16.6 ug/min via INTRAVENOUS
  Administered 2016-08-28: 25 ug/min via INTRAVENOUS
  Filled 2016-08-27: qty 250

## 2016-08-27 MED ORDER — CHLORHEXIDINE GLUCONATE 0.12 % MT SOLN
15.0000 mL | Freq: Once | OROMUCOSAL | Status: AC
  Administered 2016-08-28: 15 mL via OROMUCOSAL

## 2016-08-27 MED ORDER — SODIUM CHLORIDE 0.9% FLUSH
3.0000 mL | Freq: Two times a day (BID) | INTRAVENOUS | Status: DC
  Administered 2016-08-27: 3 mL via INTRAVENOUS

## 2016-08-27 MED ORDER — DEXTROSE 5 % IV SOLN
0.0000 ug/min | INTRAVENOUS | Status: DC
  Filled 2016-08-27 (×2): qty 4

## 2016-08-27 MED ORDER — ALBUTEROL SULFATE (2.5 MG/3ML) 0.083% IN NEBU
2.5000 mg | INHALATION_SOLUTION | Freq: Once | RESPIRATORY_TRACT | Status: AC
  Administered 2016-08-27: 2.5 mg via RESPIRATORY_TRACT

## 2016-08-27 MED ORDER — FENTANYL CITRATE (PF) 100 MCG/2ML IJ SOLN
INTRAMUSCULAR | Status: DC | PRN
  Administered 2016-08-27: 50 ug via INTRAVENOUS

## 2016-08-27 MED ORDER — HEPARIN SODIUM (PORCINE) 1000 UNIT/ML IJ SOLN
INTRAMUSCULAR | Status: DC | PRN
  Administered 2016-08-27: 4000 [IU] via INTRAVENOUS

## 2016-08-27 MED ORDER — LIDOCAINE HCL 1 % IJ SOLN
INTRAMUSCULAR | Status: AC
  Filled 2016-08-27: qty 20

## 2016-08-27 MED ORDER — VERAPAMIL HCL 2.5 MG/ML IV SOLN
INTRAVENOUS | Status: DC | PRN
  Administered 2016-08-27: 09:00:00 via INTRA_ARTERIAL

## 2016-08-27 MED ORDER — CHLORHEXIDINE GLUCONATE CLOTH 2 % EX PADS
6.0000 | MEDICATED_PAD | Freq: Once | CUTANEOUS | Status: AC
  Administered 2016-08-27: 6 via TOPICAL

## 2016-08-27 MED ORDER — SODIUM CHLORIDE 0.9 % IV SOLN
30.0000 ug/min | INTRAVENOUS | Status: AC
  Administered 2016-08-28: 25 ug/min via INTRAVENOUS
  Filled 2016-08-27 (×2): qty 2

## 2016-08-27 MED ORDER — SODIUM CHLORIDE 0.9 % IV SOLN
INTRAVENOUS | Status: AC
  Administered 2016-08-28: 1.6 [IU]/h via INTRAVENOUS
  Filled 2016-08-27 (×2): qty 2.5

## 2016-08-27 MED ORDER — CHLORHEXIDINE GLUCONATE CLOTH 2 % EX PADS
6.0000 | MEDICATED_PAD | Freq: Once | CUTANEOUS | Status: AC
Start: 2016-08-28 — End: 2016-08-28
  Administered 2016-08-28: 6 via TOPICAL

## 2016-08-27 MED ORDER — IOPAMIDOL (ISOVUE-370) INJECTION 76%
INTRAVENOUS | Status: AC
  Filled 2016-08-27: qty 100

## 2016-08-27 MED ORDER — DEXTROSE 5 % IV SOLN
1.5000 g | INTRAVENOUS | Status: AC
  Administered 2016-08-28: 1.5 g via INTRAVENOUS
  Administered 2016-08-28: .75 g via INTRAVENOUS
  Filled 2016-08-27 (×2): qty 1.5

## 2016-08-27 MED ORDER — METOPROLOL TARTRATE 12.5 MG HALF TABLET: 12.5000 mg | ORAL_TABLET | Freq: Once | ORAL | Status: DC

## 2016-08-27 MED ORDER — MIDAZOLAM HCL 2 MG/2ML IJ SOLN
INTRAMUSCULAR | Status: AC
  Filled 2016-08-27: qty 2

## 2016-08-27 MED ORDER — HEPARIN (PORCINE) IN NACL 2-0.9 UNIT/ML-% IJ SOLN
INTRAMUSCULAR | Status: AC
  Filled 2016-08-27: qty 1000

## 2016-08-27 MED ORDER — SODIUM CHLORIDE 0.9 % IV SOLN: 250.0000 mL | INTRAVENOUS | Status: DC | PRN

## 2016-08-27 MED ORDER — TRANEXAMIC ACID 1000 MG/10ML IV SOLN
1.5000 mg/kg/h | INTRAVENOUS | Status: AC
  Administered 2016-08-28: 1.5 mg/kg/h via INTRAVENOUS
  Filled 2016-08-27: qty 25

## 2016-08-27 MED ORDER — HEPARIN SODIUM (PORCINE) 1000 UNIT/ML IJ SOLN
INTRAMUSCULAR | Status: AC
  Filled 2016-08-27: qty 1

## 2016-08-27 MED ORDER — VERAPAMIL HCL 2.5 MG/ML IV SOLN
INTRAVENOUS | Status: AC
  Filled 2016-08-27: qty 2

## 2016-08-27 MED ORDER — MAGNESIUM SULFATE 50 % IJ SOLN
40.0000 meq | INTRAMUSCULAR | Status: DC
  Filled 2016-08-27 (×2): qty 10

## 2016-08-27 MED ORDER — DEXMEDETOMIDINE HCL IN NACL 400 MCG/100ML IV SOLN
0.1000 ug/kg/h | INTRAVENOUS | Status: DC
  Filled 2016-08-27 (×2): qty 100

## 2016-08-27 MED ORDER — TRANEXAMIC ACID (OHS) BOLUS VIA INFUSION
15.0000 mg/kg | INTRAVENOUS | Status: AC
  Administered 2016-08-28: 1195.5 mg via INTRAVENOUS
  Filled 2016-08-27: qty 1196

## 2016-08-27 MED ORDER — VANCOMYCIN HCL 10 G IV SOLR
1250.0000 mg | INTRAVENOUS | Status: AC
  Administered 2016-08-28: 1250 mg via INTRAVENOUS
  Administered 2016-08-28: 07:00:00 via INTRAVENOUS
  Filled 2016-08-27 (×2): qty 1250

## 2016-08-27 MED ORDER — FENTANYL CITRATE (PF) 100 MCG/2ML IJ SOLN
INTRAMUSCULAR | Status: AC
  Filled 2016-08-27: qty 2

## 2016-08-27 MED ORDER — HEPARIN (PORCINE) IN NACL 2-0.9 UNIT/ML-% IJ SOLN
INTRAMUSCULAR | Status: DC | PRN
  Administered 2016-08-27: 1000 mL

## 2016-08-27 MED ORDER — POTASSIUM CHLORIDE 2 MEQ/ML IV SOLN
80.0000 meq | INTRAVENOUS | Status: DC
  Filled 2016-08-27 (×2): qty 40

## 2016-08-27 MED ORDER — DOPAMINE-DEXTROSE 3.2-5 MG/ML-% IV SOLN
0.0000 ug/kg/min | INTRAVENOUS | Status: DC
  Filled 2016-08-27: qty 250

## 2016-08-27 MED ORDER — ONDANSETRON HCL 4 MG/2ML IJ SOLN: 4.0000 mg | Freq: Four times a day (QID) | INTRAMUSCULAR | Status: DC | PRN

## 2016-08-27 SURGICAL SUPPLY — 13 items
CATH INFINITI 5 FR JL3.5 (CATHETERS) ×2 IMPLANT
CATH INFINITI JR4 5F (CATHETERS) ×2 IMPLANT
DEVICE RAD COMP TR BAND LRG (VASCULAR PRODUCTS) ×2 IMPLANT
GLIDESHEATH SLEND A-KIT 6F 22G (SHEATH) ×2 IMPLANT
GUIDEWIRE INQWIRE 1.5J.035X260 (WIRE) ×1 IMPLANT
INQWIRE 1.5J .035X260CM (WIRE) ×2
KIT ESSENTIALS PG (KITS) ×2 IMPLANT
KIT HEART LEFT (KITS) ×2 IMPLANT
PACK CARDIAC CATHETERIZATION (CUSTOM PROCEDURE TRAY) ×2 IMPLANT
TRANSDUCER W/STOPCOCK (MISCELLANEOUS) ×2 IMPLANT
TUBING CIL FLEX 10 FLL-RA (TUBING) ×2 IMPLANT
WIRE ASAHI PROWATER 180CM (WIRE) ×2 IMPLANT
WIRE HI TORQ VERSACORE-J 145CM (WIRE) ×2 IMPLANT

## 2016-08-27 NOTE — H&P (View-Only) (Signed)
Progress Note  Patient Name: Victor Gould Date of Encounter: 08/26/2016  Primary Cardiologist: Dr. Meda Coffee  Subjective   Denies any CP since yesterday morning, denies any SOB.   Inpatient Medications    Scheduled Meds: . amLODipine  5 mg Oral QPC supper  . aspirin EC  81 mg Oral QPC breakfast  . B-complex with vitamin C  1 tablet Oral Daily  . carvedilol  3.125 mg Oral BID WC  . cholecalciferol  1,000 Units Oral QPC breakfast  . insulin aspart  0-15 Units Subcutaneous TID WC  . levothyroxine  75 mcg Oral QAC breakfast  . multivitamin with minerals  1 tablet Oral QPC breakfast  . pantoprazole  40 mg Oral Daily   Continuous Infusions: . heparin 1,100 Units/hr (08/26/16 0506)   PRN Meds: acetaminophen, nitroGLYCERIN, ondansetron (ZOFRAN) IV   Vital Signs    Vitals:   08/25/16 1638 08/25/16 1900 08/25/16 2009 08/26/16 0435  BP: (!) 173/84  (!) 154/74 134/70  Pulse: 78 70 70 69  Resp: 19  20 15   Temp: 97.6 F (36.4 C)  98 F (36.7 C) 97.7 F (36.5 C)  TempSrc: Oral Oral Oral Oral  SpO2: 96%  95% 95%  Weight:    175 lb 9.6 oz (79.7 kg)  Height:        Intake/Output Summary (Last 24 hours) at 08/26/16 0833 Last data filed at 08/26/16 0506  Gross per 24 hour  Intake            403.5 ml  Output             1325 ml  Net           -921.5 ml   Filed Weights   08/25/16 1024 08/26/16 0435  Weight: 179 lb (81.2 kg) 175 lb 9.6 oz (79.7 kg)    Telemetry    NSR without ventricular ectopy - Personally Reviewed  ECG    NSR with evolving TWI in anterior leads - Personally Reviewed  Physical Exam   GEN: No acute distress.   Neck: No JVD Cardiac: RRR, no murmurs, rubs, or gallops.  Respiratory: Clear to auscultation bilaterally. GI: Soft, nontender, non-distended  MS: No edema; No deformity. Neuro:  Nonfocal  Psych: Normal affect   Labs    Chemistry Recent Labs Lab 08/25/16 1036 08/26/16 0449  NA 136 137  K 4.0 4.0  CL 108 107  CO2 20* 21*    GLUCOSE 189* 137*  BUN 20 17  CREATININE 1.63* 1.54*  CALCIUM 8.8* 8.9  GFRNONAA 39* 42*  GFRAA 45* 48*  ANIONGAP 8 9     Hematology Recent Labs Lab 08/25/16 1036 08/26/16 0449  WBC 7.5 9.3  RBC 4.75 4.73  HGB 14.6 14.4  HCT 42.4 42.4  MCV 89.3 89.6  MCH 30.7 30.4  MCHC 34.4 34.0  RDW 14.0 13.6  PLT 252 233    Cardiac Enzymes Recent Labs Lab 08/25/16 1609 08/25/16 2122 08/26/16 0449  TROPONINI 3.67* 9.32* 11.86*    Recent Labs Lab 08/25/16 1040  TROPIPOC 0.11*     BNPNo results for input(s): BNP, PROBNP in the last 168 hours.   DDimer No results for input(s): DDIMER in the last 168 hours.   Radiology    Dg Chest 2 View  Result Date: 08/25/2016 CLINICAL DATA:  76 year old male with a history of chest pain. EXAM: CHEST  2 VIEW COMPARISON:  06/26/2014 FINDINGS: Cardiomediastinal silhouette unchanged. Double density in the low mediastinum is unchanged. Coarsened  interstitial markings bilaterally. No pleural effusion or pneumothorax. No confluent airspace disease. No displaced fracture. New mid thoracic compression fracture, likely T9, of indeterminate age. Less than 30% anterior height loss. IMPRESSION: Chronic lung changes with no evidence of acute cardiopulmonary disease. Unchanged hiatal hernia. Lateral view demonstrates a new compression fracture, likely T9 with less than 30% anterior height loss, of indeterminate age. If the patient has focal thoracic back pain at this level, consider MRI. Electronically Signed   By: Corrie Mckusick D.O.   On: 08/25/2016 10:55    Cardiac Studies   Pending echo and cath  Patient Profile     76 y.o. male with PMH of IDDM, CKD stage III, HTN, HLD, GERD who presented with chest pain. Trop went up to 11 over night.   Assessment & Plan    1. NSTEMI  - trop went up to 11 overnight. Evolving TWI in anterior leads, no obvious ST elevation. Denies any CP since yesterday morning  - Continue IV heparin. Hold off IV nitro unless  recurrent chest pain. Pending echo.   - Plan cardiac catheterization tomorrow. NPO past midnight.   - Risk and benefit of procedure explained to the patient who display clear understanding and agree to proceed. Discussed with patient possible procedural risk include bleeding, vascular injury, renal injury, arrythmia, MI, stroke and loss of limb or life.  2. CKD stage III: Baseline Cr 1.5, will hydrate overnight pending upcoming cath  3. HTN: increase coreg, pending echo, if EF low, will discontinue amlodipine and start ACEI/ARB  4. HLD: LDL level 103, goal < 70, start 80mg  lipitor daily.  Hilbert Corrigan, PA  08/26/2016, 8:33 AM    Patient examined chart reviewed SEMI troponin up to 11 no pain now ECG with anterolateral T wave changes continue heparin cath today if any further pain. Will make note on cath board to move him up on schedule increase beta blocker HR in 90's this am exam benign clear lungs no murmur good right radial pulse  Jenkins Rouge

## 2016-08-27 NOTE — Consult Note (Signed)
[address]       [place] [address]             [phone]        Mory K [place] Medical Record #[id number] Date of Birth: [birth year]  Referring: Dr. Linard Millers Primary Care: Rusty Aus, MD  Chief Complaint:    Chief Complaint  Patient presents with  . Chest Pain    History of Present Illness:       Mr. France is a 76 yo white male with known medical history of insulin dependent DM, CKD Stage 3, Hyperlipidemia, GERD, and H/O prostate cancer S/P Radical Prostatectomy ( [facility]) .  He presented to the ED via EMS on 08/25/2016 brought from [place]. He wanted to go to [place] but was told he had to go to [facility].  The patient developed complaints of chest pain while sitting at his computer.  He described the pain as being squeezing and constant with radiation into his left arm.  He also had mild associated shortness of breath and lightheadedness.  He denied N/V, palpitations and syncope.  He took ASA prior to calling EMS.  Workup in ED ruled patient in for NSTEMI and he was admitted for further care.  His Troponin level continued to trend upwards.  He was treated with Heparin and fluids.  He remained chest pain free prior to his cardiac catheterization.  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  It is felt coronary bypass grafting would be indicated and TCTS consult was obtained.  Currently the patient is chest pain free. He denies previous and family history of CAD.  He states his blood sugars are well controlled and run between 150-175.  He is physically active and does not usually experience symptoms.  He denies nicotine use in the past and currently.  Last dental visit was a month ago.  Current Activity/ Functional Status: Patient is independent with mobility/ambulation, transfers, ADL's, IADL's.   Zubrod Score: At the time of surgery this patient's most appropriate activity status/level should be described  as: []     0    Normal activity, no symptoms [x]     1    Restricted in physical strenuous activity but ambulatory, able to do out light work []     2    Ambulatory and capable of self care, unable to do work activities, up and about                 more than 50%  Of the time                            []     3    Only limited self care, in bed greater than 50% of waking hours []     4    Completely disabled, no self care, confined to bed or chair []     5    Moribund  Past Medical History:  Diagnosis Date  . Cancer [facility])    prostate  . Diabetes mellitus   . GERD (gastroesophageal reflux disease)   . Hemorrhoids   . Hypertension    PCP [name] at [facility]  . PONV (postoperative nausea and vomiting)   . Thyroid disease     Past Surgical History:  Procedure Laterality Date  . APPENDECTOMY    . colonoscopy with polypectomy    .  CYSTOSCOPY N/A 09/29/2013   Procedure: CYSTOSCOPY;  Surgeon: [name], MD;  Location: [facility];  Service: Urology;  Laterality: N/A;  . HERNIA REPAIR     inguinal bilaterally  . LEFT HEART CATH AND CORONARY ANGIOGRAPHY N/A 08/27/2016   Procedure: Left Heart Cath and Coronary Angiography;  Surgeon: [name], MD;  Location: [place] [facility];  Service: Cardiovascular;  Laterality: N/A;  . ROBOT ASSISTED LAPAROSCOPIC RADICAL PROSTATECTOMY  06/25/2011   Procedure: ROBOTIC ASSISTED LAPAROSCOPIC RADICAL PROSTATECTOMY LEVEL 2;  Surgeon: [name], MD;  Location: [facility];  Service: Urology;  Laterality: N/A;  . URETHRAL SLING N/A 09/29/2013   Procedure: MALE SLING;  Surgeon: [name], MD;  Location: [facility];  Service: Urology;  Laterality: N/A;    History  Smoking Status  . Never Smoker  Smokeless Tobacco  . Never Used    History  Alcohol Use No    Social History   Social History  . Marital status: Married    Spouse name: N/A  . Number of children: N/A  . Years of education: N/A   Occupational History  . Not on file.   Social  History Main Topics  . Smoking status: Never Smoker  . Smokeless tobacco: Never Used  . Alcohol use No  . Drug use: No  . 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 Naproxen Sodium Shortness Of Breath  . Tramadol Other (See Comments)    dizziness  . Sulfa Antibiotics Rash    Current Facility-Administered Medications  Medication Dose Route Frequency Provider Last Rate Last Dose  . 0.9 %  sodium chloride infusion  250 mL Intravenous PRN [name], MD      . 0.9% sodium chloride infusion  1.5 mL/kg/hr Intravenous Continuous [name], MD      . acetaminophen (TYLENOL) tablet 650 mg  650 mg Oral Q4H PRN [name], MD      . aspirin EC tablet 81 mg  81 mg Oral QPC breakfast Almyra Deforest, [place]   81 mg at 08/26/16 1696  . atorvastatin (LIPITOR) tablet 80 mg  80 mg Oral q1800 Almyra Deforest, [place]      . B-complex with vitamin C tablet 1 tablet  1 tablet Oral Daily Dorothy Spark, MD   1 tablet at 08/26/16 1222  . carvedilol (COREG) tablet 12.5 mg  12.5 mg Oral BID WC Josue Hector, MD   12.5 mg at 08/26/16 1733  . cholecalciferol (VITAMIN D) tablet 1,000 Units  1,000 Units Oral QPC breakfast Almyra Deforest, [place]   1,000 Units at 08/26/16 0929  . heparin ADULT infusion 100 units/mL (25000 units/248mL sodium chloride 0.45%)  1,150 Units/hr Intravenous Continuous Rebecka Apley, RPH      . insulin aspart (novoLOG) injection 0-15 Units  0-15 Units Subcutaneous TID WC Almyra Deforest, PA   2 Units at 08/27/16 0802  . insulin glargine (LANTUS) injection 17 Units  17 Units Subcutaneous Q24H Dorothy Spark, MD   17 Units at 08/26/16 1238  . levothyroxine (SYNTHROID, LEVOTHROID) tablet 75 mcg  75 mcg Oral QAC breakfast Almyra Deforest, [place]   75 mcg at 08/26/16 7893  . multivitamin with minerals tablet 1 tablet  1 tablet Oral QPC breakfast Almyra Deforest, [place]   1 tablet at 08/26/16 0924  . nitroGLYCERIN (NITROSTAT) SL tablet 0.4 mg  0.4  mg  Sublingual Q5 Min x 3 PRN Almyra Deforest, PA      . ondansetron [facility]) injection 4 mg  4 mg Intravenous Q6H PRN [name], MD      . pantoprazole (PROTONIX) EC tablet 40 mg  40 mg Oral Daily Almyra Deforest, [place]   40 mg at 08/26/16 2947  . sodium chloride flush (NS) 0.9 % injection 3 mL  3 mL Intravenous Q12H [name], MD      . sodium chloride flush (NS) 0.9 % injection 3 mL  3 mL Intravenous PRN [name], MD        Prescriptions Prior to Admission  Medication Sig Dispense Refill Last Dose  . acetaminophen (TYLENOL) 500 MG tablet Take 1,000 mg by mouth daily as needed for headache (pain).   several weeks ago  . amLODipine (NORVASC) 5 MG tablet Take 5 mg by mouth daily after supper.   08/24/2016 at pm  . aspirin EC 81 MG tablet Take 81 mg by mouth daily after breakfast.   08/25/2016 at 930  . b complex vitamins tablet Take 1 tablet by mouth daily after breakfast.   08/24/2016 at am  . cholecalciferol (VITAMIN D) 1000 units tablet Take 1,000 Units by mouth daily after breakfast.   08/24/2016 at am  . hydrocortisone cream 1 % Apply 1 application topically daily as needed for itching.   couple days ago  . insulin glargine (LANTUS) 100 unit/mL SOPN Inject 17 Units into the skin daily before breakfast.   08/24/2016 at am  . insulin lispro (HUMALOG KWIKPEN) 100 UNIT/ML KiwkPen Inject 7-10 Units into the skin See admin instructions. Inject 7 units subcutaneously before breakfast, lunch and dinner - plus adjustment for CBG 140-180 add 1 unit, 180-220 add 3 units, >220 add 3 units   08/24/2016 at supper  . levothyroxine (SYNTHROID, LEVOTHROID) 75 MCG tablet Take 75 mcg by mouth daily before breakfast.   08/24/2016 at am  . losartan (COZAAR) 50 MG tablet Take 50 mg by mouth daily after breakfast.   08/24/2016 at am  . Multiple Vitamin (MULTIVITAMIN WITH MINERALS) TABS tablet Take 1 tablet by mouth daily after breakfast.   08/24/2016 at am  . omeprazole (PRILOSEC) 20 MG capsule Take 20 mg by mouth daily before  breakfast.    08/24/2016 at am  . OVER THE COUNTER MEDICATION Place 1 drop into both eyes daily as needed (dry eyes). Over the counter lubricating eye drops - must be preservative free   3-4 days ago    History reviewed. No pertinent family history.  Review of Systems:  Constitutional: negative for fatigue, fevers and night sweats Eyes: negative Respiratory: positive for shortness of breath with presenting episode of chest pain Cardiovascular: positive for chest pain and on presentation, none since admission Gastrointestinal: negative Integument/breast: negative Neurological: negative     Cardiac Review of Systems: Y or N  Chest Pain [ y   ]  Resting SOB [ y  ] Exertional SOB  [  ]  Orthopnea [  ]   Pedal Edema [   ]    Palpitations [ n ] Syncope  [n ]   Presyncope [   ]  General Review of Systems: [Y] = yes [  ]=no Constitional: recent weight change [n  ]; anorexia [  ]; fatigue [  ]; nausea [n  ]; night sweats [n  ]; fever [ n ]; or chills [ n ]  Dental: poor dentition[  ]; Last Dentist visit:   Eye : blurred vision [  ]; diplopia [   ]; vision changes [  ];  Amaurosis fugax[  ]; Resp: cough [  ];  wheezing[  ];  hemoptysis[  ]; shortness of breath[y  ]; paroxysmal nocturnal dyspnea[  ]; dyspnea on exertion[  ]; or orthopnea[  ];  GI:  gallstones[  ], vomiting[ n ];  dysphagia[  ]; melena[  ];  hematochezia [  ]; heartburn[  ];   Hx of  Colonoscopy[  ]; GU: kidney stones [  ]; hematuria[  ];   dysuria [  ];  nocturia[  ];  history of     obstruction [  ]; urinary frequency [  ]             Skin: rash, swelling[n  ];, hair loss[  ];  peripheral edema[n  ];  or itching[  ]; Musculosketetal: myalgias[  ];  joint swelling[  ];  joint erythema[  ];  joint pain[  ];  back pain[  ];  Heme/Lymph: bruising[  ];  bleeding[  ];  anemia[  ];  Neuro: TIA[  ];  headaches[  ];  stroke[  ];  vertigo[  ];  seizures[  ];   paresthesias[  ];   difficulty walking[  ];  Psych:depression[  ]; anxiety[  ];  Endocrine: diabetes[ y ];  thyroid dysfunction[y  ];  Immunizations: Flu [  ]; Pneumococcal[  ];  Other: H/O Prostate Cancer, S/P Radical Prostatectomy  Physical Exam: BP 134/76   Pulse 61   Temp 97.8 F (36.6 C) (Oral)   Resp 19   Ht 5\' 9"  (1.753 m)   Wt 175 lb 9.6 oz (79.7 kg)   SpO2 94%   BMI 25.93 kg/m   General appearance: alert, cooperative and no distress Head: Normocephalic, without obvious abnormality, atraumatic Neck: no adenopathy, no carotid bruit, no JVD, supple, symmetrical, trachea midline   Resp: clear to auscultation bilaterally Cardio: regular rate and rhythm GI: soft, non-tender; bowel sounds normal; no masses,  no organomegaly Extremities: extremities normal, atraumatic, no cyanosis or edema Neurologic: Grossly normal Vein at ankles appears ok Right tr band in place   Diagnostic Studies & Laboratory data:     Recent Radiology Findings:   Dg Chest 2 View  Result Date: 08/25/2016 CLINICAL DATA:  76 year old male with a history of chest pain. EXAM: CHEST  2 VIEW COMPARISON:  06/26/2014 FINDINGS: Cardiomediastinal silhouette unchanged. Double density in the low mediastinum is unchanged. Coarsened interstitial markings bilaterally. No pleural effusion or pneumothorax. No confluent airspace disease. No displaced fracture. New mid thoracic compression fracture, likely T9, of indeterminate age. Less than 30% anterior height loss. IMPRESSION: Chronic lung changes with no evidence of acute cardiopulmonary disease. Unchanged hiatal hernia. Lateral view demonstrates a new compression fracture, likely T9 with less than 30% anterior height loss, of indeterminate age. If the patient has focal thoracic back pain at this level, consider MRI. Electronically Signed   By: Corrie Mckusick D.O.   On: 08/25/2016 10:55     I have independently reviewed the above radiologic studies.  Recent Lab Findings: Lab Results    Component Value Date   WBC 8.1 08/27/2016   HGB 13.8 08/27/2016   HCT 40.9 08/27/2016   PLT 202 08/27/2016   GLUCOSE 166 (H) 08/27/2016   CHOL 164 08/26/2016   TRIG 84 08/26/2016   HDL 44 08/26/2016   LDLCALC 103 (H) 08/26/2016  NA 137 08/27/2016   K 3.9 08/27/2016   CL 106 08/27/2016   CREATININE 1.62 (H) 08/27/2016   BUN 21 (H) 08/27/2016   CO2 21 (L) 08/27/2016   INR 1.13 08/27/2016   CARDIAC CATHETERIZATION:   Severe three-vessel coronary disease with total occlusion of the proximal LAD after the first diagonal, high-grade obstruction with superimposed thrombus in the large first diagonal which collateralizes the distal LAD, 80-90% obstruction in a small to moderate ramus intermedius, 70% stenosis and small first obtuse marginal, greater than 90% obstruction in the ostium to proximal PDA and 70% obstruction in the ongoing right coronary before a large left ventricular branch.  Preserved left ventricular systolic function, EF 67% with normal EDP of 16 mmHg   Chronic Kidney Disease   Stage I     GFR >90  Stage II    GFR 60-89  Stage IIIA GFR 45-59  Stage IIIB GFR 30-44  Stage IV   GFR 15-29  Stage V    GFR  <15  Lab Results  Component Value Date   CREATININE 1.62 (H) 08/27/2016   Estimated Creatinine Clearance: 38.2 mL/min (A) (by C-G formula based on SCr of 1.62 mg/dL (H)).   Assessment / Plan:      1. CAD, Ruled in for NSTEMI Troponin >11- cath this morning with 3 vessel CAD.Marland Kitchen Recommending CABG 2. CKD - Stage IIIB GFR 30-44 3. DM- on insulin at home, will get A1c to assess control 4. CV- HTN- on Cozaar at home, currently on Coreg 5. Dispo- patient stable, denies chest pain,    I agree with the recommendation by Dr Tamala Julian to proceed with CABG with DM and complex 3 vessel disease - Will plan to check cr in early am and if stable proceed with CABG tomorrow .   The goals risks and alternatives of the planned surgical procedure Procedure(s): CABG  have been  discussed with the patient in detail. The risks of the procedure including death, infection, stroke, myocardial infarction, bleeding, blood transfusion have all been discussed specifically.  I have quoted [name] a 3% of perioperative mortality and a complication rate as high as 40%. The patient's questions have been answered.[name] is willing  to proceed with the planned procedure.  [name] MD      [address] [place] [address] Office [phone]   [place]

## 2016-08-27 NOTE — Progress Notes (Signed)
Progress Note  Patient Name: Victor Gould Date of Encounter: 08/27/2016  Primary Cardiologist: Meda Coffee  Subjective   No chest pain. Heading down for cardiac cath.   Inpatient Medications    Scheduled Meds: . aspirin EC  81 mg Oral QPC breakfast  . atorvastatin  80 mg Oral q1800  . B-complex with vitamin C  1 tablet Oral Daily  . carvedilol  12.5 mg Oral BID WC  . cholecalciferol  1,000 Units Oral QPC breakfast  . insulin aspart  0-15 Units Subcutaneous TID WC  . insulin glargine  17 Units Subcutaneous Q24H  . levothyroxine  75 mcg Oral QAC breakfast  . multivitamin with minerals  1 tablet Oral QPC breakfast  . pantoprazole  40 mg Oral Daily  . sodium chloride flush  3 mL Intravenous Q12H   Continuous Infusions: . sodium chloride    . sodium chloride 75 mL/hr at 08/27/16 0505  . heparin Stopped (08/27/16 0802)   PRN Meds: sodium chloride, acetaminophen, nitroGLYCERIN, ondansetron (ZOFRAN) IV, sodium chloride flush   Vital Signs    Vitals:   08/25/16 2009 08/26/16 0435 08/26/16 2039 08/27/16 0500  BP:  134/70 133/66 127/60  Pulse: 70 69 75 62  Resp: 20 15  13   Temp: 98 F (36.7 C) 97.7 F (36.5 C) 97.9 F (36.6 C)   TempSrc: Oral Oral Oral   SpO2: 95% 95% 98%   Weight:  175 lb 9.6 oz (79.7 kg)    Height:        Intake/Output Summary (Last 24 hours) at 08/27/16 0806 Last data filed at 08/26/16 2300  Gross per 24 hour  Intake           431.85 ml  Output              675 ml  Net          -243.15 ml   Filed Weights   08/25/16 1024 08/26/16 0435  Weight: 179 lb (81.2 kg) 175 lb 9.6 oz (79.7 kg)    Telemetry    SR - Personally Reviewed  ECG    SR with evolving anterior lateral TWI - Personally Reviewed  Physical Exam   General: Well developed, well nourished, male appearing in no acute distress. Head: Normocephalic, atraumatic.  Neck: Supple without bruits, JVD. Lungs:  Resp regular and unlabored, CTA. Heart: RRR, S1, S2, no S3, S4, or  murmur; no rub. Abdomen: Soft, non-tender, non-distended with normoactive bowel sounds. No hepatomegaly. No rebound/guarding. No obvious abdominal masses. Extremities: No clubbing, cyanosis, edema. Distal pedal pulses are 2+ bilaterally. Neuro: Alert and oriented X 3. Moves all extremities spontaneously. Psych: Normal affect.  Labs    Chemistry Recent Labs Lab 08/25/16 1036 08/26/16 0449 08/27/16 0611  NA 136 137 137  K 4.0 4.0 3.9  CL 108 107 106  CO2 20* 21* 21*  GLUCOSE 189* 137* 166*  BUN 20 17 21*  CREATININE 1.63* 1.54* 1.62*  CALCIUM 8.8* 8.9 8.7*  GFRNONAA 39* 42* 39*  GFRAA 45* 48* 46*  ANIONGAP 8 9 10      Hematology Recent Labs Lab 08/25/16 1036 08/26/16 0449 08/27/16 0253  WBC 7.5 9.3 8.1  RBC 4.75 4.73 4.53  HGB 14.6 14.4 13.8  HCT 42.4 42.4 40.9  MCV 89.3 89.6 90.3  MCH 30.7 30.4 30.5  MCHC 34.4 34.0 33.7  RDW 14.0 13.6 13.6  PLT 252 233 202    Cardiac Enzymes Recent Labs Lab 08/25/16 1609 08/25/16 2122 08/26/16 0449  TROPONINI 3.67* 9.32* 11.86*    Recent Labs Lab 08/25/16 1040  TROPIPOC 0.11*     BNPNo results for input(s): BNP, PROBNP in the last 168 hours.   DDimer No results for input(s): DDIMER in the last 168 hours.    Radiology    Dg Chest 2 View  Result Date: 08/25/2016 CLINICAL DATA:  76 year old male with a history of chest pain. EXAM: CHEST  2 VIEW COMPARISON:  06/26/2014 FINDINGS: Cardiomediastinal silhouette unchanged. Double density in the low mediastinum is unchanged. Coarsened interstitial markings bilaterally. No pleural effusion or pneumothorax. No confluent airspace disease. No displaced fracture. New mid thoracic compression fracture, likely T9, of indeterminate age. Less than 30% anterior height loss. IMPRESSION: Chronic lung changes with no evidence of acute cardiopulmonary disease. Unchanged hiatal hernia. Lateral view demonstrates a new compression fracture, likely T9 with less than 30% anterior height loss, of  indeterminate age. If the patient has focal thoracic back pain at this level, consider MRI. Electronically Signed   By: Corrie Mckusick D.O.   On: 08/25/2016 10:55    Cardiac Studies   N/A  Patient Profile     76 y.o. male with PMH of IDDM, CKD stage III, HTN, HLD, GERD who presented with chest pain. Troponin elevated to 11.86, planned for cardiac cath.   Assessment & Plan    1. NSTEMI: Trop peaked 11.86. EKG shows evolving TWI in anterior/lateral leads. No further episodes of chest pain. Remains on IV heparin. Planned for cardiac cath today.  -- on BB, ASA and statin -- echo pending  2. CKD: Baseline Cr around 1.5. Given IV hydration. Cr 1.62 this morning. Follow BMET  3. HTN: Stable this morning.  4. HL: LDL level 103, goal < 70, started on 80mg  lipitor daily. Needs LFTs/FLP in 6 weeks  Signed, Reino Bellis, NP  08/27/2016, 8:06 AM    Pt for cath today. I did not get to see him with Reino Bellis, NP. Further plans to follow after his cath.   Lauree Chandler 08/27/2016 9:46 AM

## 2016-08-27 NOTE — Interval H&P Note (Signed)
Cath Lab Visit (complete for each Cath Lab visit)  Clinical Evaluation Leading to the Procedure:   ACS: Yes.    Non-ACS:    Anginal Classification: CCS IV  Anti-ischemic medical therapy: No Therapy  Non-Invasive Test Results: No non-invasive testing performed  Prior CABG: No previous CABG      History and Physical Interval Note:  08/27/2016 8:20 AM  [name]  has presented today for surgery, with the diagnosis of NSTEMI  The various methods of treatment have been discussed with the patient and family. After consideration of risks, benefits and other options for treatment, the patient has consented to  Procedure(s): Left Heart Cath and Coronary Angiography (N/A) as a surgical intervention .  The patient's history has been reviewed, patient examined, no change in status, stable for surgery.  I have reviewed the patient's chart and labs.  Questions were answered to the patient's satisfaction.     Victor Gould

## 2016-08-27 NOTE — Progress Notes (Signed)
[place] for heparin Indication: chest pain/ACS  Allergies  Allergen Reactions  . Naproxen Sodium Shortness Of Breath  . Tramadol Other (See Comments)    dizziness  . Sulfa Antibiotics Rash   Patient Measurements: Height: 5\' 9"  (175.3 cm) Weight: 175 lb 9.6 oz (79.7 kg) IBW/kg (Calculated) : 70.7 Heparin Dosing Weight: 81 kg  Assessment: 76 yo M presents on 4/28 with CP. Pharmacy consulted to start heparin gtt. No anticoag PTA.   Pharmacy is consulted to restart heparin 8h after sheath removal. Sheath was removed at 0910. Will restart last therapeutic dose with no bolus.  Goal of Therapy:  Heparin level 0.3-0.7 units/ml Monitor platelets by anticoagulation protocol: Yes   Plan:  Restart heparin 1150 units/hr at 1715 8h HL Daily HL, CBC  Andrey Cota. Diona Foley, PharmD, BCPS Clinical Pharmacist [phone] 08/27/2016 9:27 AM

## 2016-08-27 NOTE — Plan of Care (Signed)
Problem: Safety: Goal: Ability to remain free from injury will improve Outcome: Progressing Fall risk bundle in place. No injuries this shift. Will continue to monitor.

## 2016-08-27 NOTE — Progress Notes (Signed)
Discussed pre-op education with pt and wife including sternal precautions, mobility, IS (and provided IS), and d/c planning. Voiced understanding. Gave pt and wife video to watch and OHS booklet and careguide to read. Voiced understanding, receptive. Pt eating now.  [place] CES, ACSM 1:40 PM 08/27/2016

## 2016-08-28 ENCOUNTER — Inpatient Hospital Stay (HOSPITAL_COMMUNITY): Payer: Medicare Other

## 2016-08-28 ENCOUNTER — Inpatient Hospital Stay (HOSPITAL_COMMUNITY)
Admission: EM | Disposition: A | Discharge status: Home / Self Care | Payer: Self-pay | Source: Home / Self Care | Attending: Cardiology

## 2016-08-28 ENCOUNTER — Inpatient Hospital Stay (HOSPITAL_COMMUNITY): Payer: Medicare Other | Admitting: Certified Registered"

## 2016-08-28 DIAGNOSIS — I25110 Atherosclerotic heart disease of native coronary artery with unstable angina pectoris: Secondary | ICD-10-CM

## 2016-08-28 HISTORY — PX: CORONARY ARTERY BYPASS GRAFT: SHX141

## 2016-08-28 HISTORY — PX: TEE WITHOUT CARDIOVERSION: SHX5443

## 2016-08-28 LAB — HEPARIN LEVEL (UNFRACTIONATED): Heparin Unfractionated: 0.25 IU/mL — ABNORMAL LOW (ref 0.30–0.70)

## 2016-08-28 LAB — POCT I-STAT 3, ART BLOOD GAS (G3+)
Acid-base deficit: 3 mmol/L — ABNORMAL HIGH (ref 0.0–2.0)
Acid-base deficit: 1 mmol/L (ref 0.0–2.0)
Acid-base deficit: 4 mmol/L — ABNORMAL HIGH (ref 0.0–2.0)
Acid-base deficit: 3 mmol/L — ABNORMAL HIGH (ref 0.0–2.0)
Acid-base deficit: 5 mmol/L — ABNORMAL HIGH (ref 0.0–2.0)
Acid-base deficit: 6 mmol/L — ABNORMAL HIGH (ref 0.0–2.0)
Bicarbonate: 22.9 mmol/L (ref 20.0–28.0)
Bicarbonate: 24.2 mmol/L (ref 20.0–28.0)
Bicarbonate: 20.4 mmol/L (ref 20.0–28.0)
Bicarbonate: 18.9 mmol/L — ABNORMAL LOW (ref 20.0–28.0)
Bicarbonate: 19.7 mmol/L — ABNORMAL LOW (ref 20.0–28.0)
Bicarbonate: 18.5 mmol/L — ABNORMAL LOW (ref 20.0–28.0)
O2 Saturation: 100 %
O2 Saturation: 100 %
O2 Saturation: 97 %
O2 Saturation: 100 %
O2 Saturation: 94 %
O2 Saturation: 93 %
Patient temperature: 36.1
Patient temperature: 35.8
Patient temperature: 36.8
TCO2: 24 mmol/L (ref 0–100)
TCO2: 25 mmol/L (ref 0–100)
TCO2: 21 mmol/L (ref 0–100)
TCO2: 20 mmol/L (ref 0–100)
TCO2: 21 mmol/L (ref 0–100)
TCO2: 20 mmol/L (ref 0–100)
pCO2 arterial: 40.9 mmHg (ref 32.0–48.0)
pCO2 arterial: 42.5 mmHg (ref 32.0–48.0)
pCO2 arterial: 35.4 mmHg (ref 32.0–48.0)
pCO2 arterial: 23.1 mmHg — ABNORMAL LOW (ref 32.0–48.0)
pCO2 arterial: 34.2 mmHg (ref 32.0–48.0)
pCO2 arterial: 32.7 mmHg (ref 32.0–48.0)
pH, Arterial: 7.356 (ref 7.350–7.450)
pH, Arterial: 7.363 (ref 7.350–7.450)
pH, Arterial: 7.368 (ref 7.350–7.450)
pH, Arterial: 7.517 — ABNORMAL HIGH (ref 7.350–7.450)
pH, Arterial: 7.362 (ref 7.350–7.450)
pH, Arterial: 7.361 (ref 7.350–7.450)
pO2, Arterial: 335 mmHg — ABNORMAL HIGH (ref 83.0–108.0)
pO2, Arterial: 473 mmHg — ABNORMAL HIGH (ref 83.0–108.0)
pO2, Arterial: 93 mmHg (ref 83.0–108.0)
pO2, Arterial: 314 mmHg — ABNORMAL HIGH (ref 83.0–108.0)
pO2, Arterial: 68 mmHg — ABNORMAL LOW (ref 83.0–108.0)
pO2, Arterial: 68 mmHg — ABNORMAL LOW (ref 83.0–108.0)

## 2016-08-28 LAB — GLUCOSE, CAPILLARY
Glucose-Capillary: 100 mg/dL — ABNORMAL HIGH (ref 65–99)
Glucose-Capillary: 102 mg/dL — ABNORMAL HIGH (ref 65–99)
Glucose-Capillary: 106 mg/dL — ABNORMAL HIGH (ref 65–99)
Glucose-Capillary: 106 mg/dL — ABNORMAL HIGH (ref 65–99)
Glucose-Capillary: 106 mg/dL — ABNORMAL HIGH (ref 65–99)
Glucose-Capillary: 117 mg/dL — ABNORMAL HIGH (ref 65–99)
Glucose-Capillary: 118 mg/dL — ABNORMAL HIGH (ref 65–99)
Glucose-Capillary: 118 mg/dL — ABNORMAL HIGH (ref 65–99)
Glucose-Capillary: 121 mg/dL — ABNORMAL HIGH (ref 65–99)
Glucose-Capillary: 67 mg/dL (ref 65–99)
Glucose-Capillary: 76 mg/dL (ref 65–99)
Glucose-Capillary: 92 mg/dL (ref 65–99)

## 2016-08-28 LAB — POCT I-STAT, CHEM 8
BUN: 16 mg/dL (ref 6–20)
BUN: 14 mg/dL (ref 6–20)
BUN: 16 mg/dL (ref 6–20)
BUN: 15 mg/dL (ref 6–20)
BUN: 14 mg/dL (ref 6–20)
BUN: 15 mg/dL (ref 6–20)
BUN: 14 mg/dL (ref 6–20)
Calcium, Ion: 1.24 mmol/L (ref 1.15–1.40)
Calcium, Ion: 1.09 mmol/L — ABNORMAL LOW (ref 1.15–1.40)
Calcium, Ion: 1.27 mmol/L (ref 1.15–1.40)
Calcium, Ion: 1.08 mmol/L — ABNORMAL LOW (ref 1.15–1.40)
Calcium, Ion: 1.07 mmol/L — ABNORMAL LOW (ref 1.15–1.40)
Calcium, Ion: 1.09 mmol/L — ABNORMAL LOW (ref 1.15–1.40)
Calcium, Ion: 1.17 mmol/L (ref 1.15–1.40)
Chloride: 105 mmol/L (ref 101–111)
Chloride: 104 mmol/L (ref 101–111)
Chloride: 106 mmol/L (ref 101–111)
Chloride: 102 mmol/L (ref 101–111)
Chloride: 101 mmol/L (ref 101–111)
Chloride: 102 mmol/L (ref 101–111)
Chloride: 105 mmol/L (ref 101–111)
Creatinine, Ser: 1.3 mg/dL — ABNORMAL HIGH (ref 0.61–1.24)
Creatinine, Ser: 1.2 mg/dL (ref 0.61–1.24)
Creatinine, Ser: 1.4 mg/dL — ABNORMAL HIGH (ref 0.61–1.24)
Creatinine, Ser: 1.2 mg/dL (ref 0.61–1.24)
Creatinine, Ser: 1.2 mg/dL (ref 0.61–1.24)
Creatinine, Ser: 1.3 mg/dL — ABNORMAL HIGH (ref 0.61–1.24)
Creatinine, Ser: 1.4 mg/dL — ABNORMAL HIGH (ref 0.61–1.24)
Glucose, Bld: 142 mg/dL — ABNORMAL HIGH (ref 65–99)
Glucose, Bld: 123 mg/dL — ABNORMAL HIGH (ref 65–99)
Glucose, Bld: 138 mg/dL — ABNORMAL HIGH (ref 65–99)
Glucose, Bld: 150 mg/dL — ABNORMAL HIGH (ref 65–99)
Glucose, Bld: 141 mg/dL — ABNORMAL HIGH (ref 65–99)
Glucose, Bld: 132 mg/dL — ABNORMAL HIGH (ref 65–99)
Glucose, Bld: 119 mg/dL — ABNORMAL HIGH (ref 65–99)
HCT: 36 % — ABNORMAL LOW (ref 39.0–52.0)
HCT: 26 % — ABNORMAL LOW (ref 39.0–52.0)
HCT: 33 % — ABNORMAL LOW (ref 39.0–52.0)
HCT: 25 % — ABNORMAL LOW (ref 39.0–52.0)
HCT: 23 % — ABNORMAL LOW (ref 39.0–52.0)
HCT: 26 % — ABNORMAL LOW (ref 39.0–52.0)
HCT: 28 % — ABNORMAL LOW (ref 39.0–52.0)
Hemoglobin: 12.2 g/dL — ABNORMAL LOW (ref 13.0–17.0)
Hemoglobin: 11.2 g/dL — ABNORMAL LOW (ref 13.0–17.0)
Hemoglobin: 8.8 g/dL — ABNORMAL LOW (ref 13.0–17.0)
Hemoglobin: 8.5 g/dL — ABNORMAL LOW (ref 13.0–17.0)
Hemoglobin: 7.8 g/dL — ABNORMAL LOW (ref 13.0–17.0)
Hemoglobin: 8.8 g/dL — ABNORMAL LOW (ref 13.0–17.0)
Hemoglobin: 9.5 g/dL — ABNORMAL LOW (ref 13.0–17.0)
Potassium: 3.7 mmol/L (ref 3.5–5.1)
Potassium: 3.9 mmol/L (ref 3.5–5.1)
Potassium: 4.2 mmol/L (ref 3.5–5.1)
Potassium: 4.7 mmol/L (ref 3.5–5.1)
Potassium: 5 mmol/L (ref 3.5–5.1)
Potassium: 3.9 mmol/L (ref 3.5–5.1)
Potassium: 4.4 mmol/L (ref 3.5–5.1)
Sodium: 142 mmol/L (ref 135–145)
Sodium: 139 mmol/L (ref 135–145)
Sodium: 139 mmol/L (ref 135–145)
Sodium: 134 mmol/L — ABNORMAL LOW (ref 135–145)
Sodium: 133 mmol/L — ABNORMAL LOW (ref 135–145)
Sodium: 136 mmol/L (ref 135–145)
Sodium: 138 mmol/L (ref 135–145)
TCO2: 26 mmol/L (ref 0–100)
TCO2: 27 mmol/L (ref 0–100)
TCO2: 27 mmol/L (ref 0–100)
TCO2: 26 mmol/L (ref 0–100)
TCO2: 26 mmol/L (ref 0–100)
TCO2: 22 mmol/L (ref 0–100)
TCO2: 20 mmol/L (ref 0–100)

## 2016-08-28 LAB — CREATININE, SERUM
Creatinine, Ser: 1.4 mg/dL — ABNORMAL HIGH (ref 0.61–1.24)
GFR calc Af Amer: 54 mL/min — ABNORMAL LOW (ref >60)
GFR calc non Af Amer: 47 mL/min — ABNORMAL LOW (ref >60)

## 2016-08-28 LAB — CBC
HCT: 41.6 % (ref 39.0–52.0)
HCT: 30.1 % — ABNORMAL LOW (ref 39.0–52.0)
HEMATOCRIT: 31.6 % — AB (ref 39.0–52.0)
Hemoglobin: 13.9 g/dL (ref 13.0–17.0)
Hemoglobin: 10.5 g/dL — ABNORMAL LOW (ref 13.0–17.0)
Hemoglobin: 10.2 g/dL — ABNORMAL LOW (ref 13.0–17.0)
MCH: 30.2 pg (ref 26.0–34.0)
MCH: 29.7 pg (ref 26.0–34.0)
MCH: 30.3 pg (ref 26.0–34.0)
MCHC: 33.4 g/dL (ref 30.0–36.0)
MCHC: 33.2 g/dL (ref 30.0–36.0)
MCHC: 33.9 g/dL (ref 30.0–36.0)
MCV: 90.2 fL (ref 78.0–100.0)
MCV: 89.3 fL (ref 78.0–100.0)
MCV: 89.3 fL (ref 78.0–100.0)
PLATELETS: 196 10*3/uL (ref 150–400)
Platelets: 129 10*3/uL — ABNORMAL LOW (ref 150–400)
Platelets: 122 10*3/uL — ABNORMAL LOW (ref 150–400)
RBC: 4.61 MIL/uL (ref 4.22–5.81)
RBC: 3.54 MIL/uL — ABNORMAL LOW (ref 4.22–5.81)
RBC: 3.37 MIL/uL — ABNORMAL LOW (ref 4.22–5.81)
RDW: 14 % (ref 11.5–15.5)
RDW: 13.5 % (ref 11.5–15.5)
RDW: 13.7 % (ref 11.5–15.5)
WBC: 7.2 10*3/uL (ref 4.0–10.5)
WBC: 10.2 10*3/uL (ref 4.0–10.5)
WBC: 9.5 10*3/uL (ref 4.0–10.5)

## 2016-08-28 LAB — APTT
APTT: 33 s (ref 24–36)
aPTT: 75 seconds — ABNORMAL HIGH (ref 24–36)

## 2016-08-28 LAB — HEMOGLOBIN A1C
Hgb A1c MFr Bld: 7.7 % — ABNORMAL HIGH (ref 4.8–5.6)
Mean Plasma Glucose: 174 mg/dL

## 2016-08-28 LAB — COMPREHENSIVE METABOLIC PANEL
ALT: 17 U/L (ref 17–63)
AST: 27 U/L (ref 15–41)
Albumin: 3.3 g/dL — ABNORMAL LOW (ref 3.5–5.0)
Alkaline Phosphatase: 49 U/L (ref 38–126)
Anion gap: 6 (ref 5–15)
BUN: 16 mg/dL (ref 6–20)
CO2: 22 mmol/L (ref 22–32)
Calcium: 8.7 mg/dL — ABNORMAL LOW (ref 8.9–10.3)
Chloride: 110 mmol/L (ref 101–111)
Creatinine, Ser: 1.52 mg/dL — ABNORMAL HIGH (ref 0.61–1.24)
GFR calc Af Amer: 49 mL/min — ABNORMAL LOW (ref >60)
GFR calc non Af Amer: 42 mL/min — ABNORMAL LOW (ref >60)
Glucose, Bld: 131 mg/dL — ABNORMAL HIGH (ref 65–99)
Potassium: 3.9 mmol/L (ref 3.5–5.1)
Sodium: 138 mmol/L (ref 135–145)
Total Bilirubin: 0.7 mg/dL (ref 0.3–1.2)
Total Protein: 6.3 g/dL — ABNORMAL LOW (ref 6.5–8.1)

## 2016-08-28 LAB — ECHO TEE
HEIGHTINCHES: 69 in
WEIGHTICAEL: 2846.4 [oz_av]

## 2016-08-28 LAB — PROTIME-INR
INR: 1.07
INR: 1.49
Prothrombin Time: 13.9 seconds (ref 11.4–15.2)
Prothrombin Time: 18.2 seconds — ABNORMAL HIGH (ref 11.4–15.2)

## 2016-08-28 LAB — HEMOGLOBIN AND HEMATOCRIT, BLOOD
HCT: 25.4 % — ABNORMAL LOW (ref 39.0–52.0)
Hemoglobin: 8.6 g/dL — ABNORMAL LOW (ref 13.0–17.0)

## 2016-08-28 LAB — PLATELET COUNT: Platelets: 156 10*3/uL (ref 150–400)

## 2016-08-28 LAB — POCT I-STAT 4, (NA,K, GLUC, HGB,HCT)
Glucose, Bld: 103 mg/dL — ABNORMAL HIGH (ref 65–99)
HCT: 28 % — ABNORMAL LOW (ref 39.0–52.0)
Hemoglobin: 9.5 g/dL — ABNORMAL LOW (ref 13.0–17.0)
Potassium: 3.9 mmol/L (ref 3.5–5.1)
Sodium: 139 mmol/L (ref 135–145)

## 2016-08-28 LAB — MAGNESIUM: Magnesium: 2.7 mg/dL — ABNORMAL HIGH (ref 1.7–2.4)

## 2016-08-28 SURGERY — CORONARY ARTERY BYPASS GRAFTING (CABG)
Anesthesia: General | Site: Chest

## 2016-08-28 MED ORDER — SODIUM CHLORIDE 0.45 % IV SOLN
INTRAVENOUS | Status: DC | PRN
  Administered 2016-08-28: 14:00:00 via INTRAVENOUS

## 2016-08-28 MED ORDER — SODIUM CHLORIDE 0.9% FLUSH
3.0000 mL | Freq: Two times a day (BID) | INTRAVENOUS | Status: DC
  Administered 2016-08-29 – 2016-08-30 (×3): 3 mL via INTRAVENOUS

## 2016-08-28 MED ORDER — INSULIN REGULAR BOLUS VIA INFUSION
0.0000 [IU] | Freq: Three times a day (TID) | INTRAVENOUS | Status: DC
  Filled 2016-08-28: qty 10

## 2016-08-28 MED ORDER — SODIUM CHLORIDE 0.9 % IJ SOLN
OROMUCOSAL | Status: DC | PRN
  Administered 2016-08-28 (×3): 4 mL via TOPICAL

## 2016-08-28 MED ORDER — LACTATED RINGERS IV SOLN: 500.0000 mL | Freq: Once | INTRAVENOUS | Status: DC | PRN

## 2016-08-28 MED ORDER — METOPROLOL TARTRATE 12.5 MG HALF TABLET
12.5000 mg | ORAL_TABLET | Freq: Two times a day (BID) | ORAL | Status: DC
  Administered 2016-08-29 – 2016-08-30 (×4): 12.5 mg via ORAL
  Filled 2016-08-28 (×4): qty 1

## 2016-08-28 MED ORDER — AMIODARONE IV BOLUS ONLY 150 MG/100ML
150.0000 mg | INTRAVENOUS | Status: AC
  Administered 2016-08-28: 150 mg via INTRAVENOUS
  Filled 2016-08-28: qty 100

## 2016-08-28 MED ORDER — ROCURONIUM BROMIDE 100 MG/10ML IV SOLN
INTRAVENOUS | Status: DC | PRN
  Administered 2016-08-28 (×4): 50 mg via INTRAVENOUS

## 2016-08-28 MED ORDER — FENTANYL CITRATE (PF) 250 MCG/5ML IJ SOLN
INTRAMUSCULAR | Status: AC
  Filled 2016-08-28: qty 25

## 2016-08-28 MED ORDER — SODIUM CHLORIDE 0.9 % IV SOLN
INTRAVENOUS | Status: DC
  Administered 2016-08-28: 1.2 [IU]/h via INTRAVENOUS
  Filled 2016-08-28 (×2): qty 2.5

## 2016-08-28 MED ORDER — ALBUMIN HUMAN 5 % IV SOLN
250.0000 mL | INTRAVENOUS | Status: AC | PRN
  Administered 2016-08-28 (×4): 250 mL via INTRAVENOUS
  Filled 2016-08-28: qty 250

## 2016-08-28 MED ORDER — EPHEDRINE SULFATE 50 MG/ML IJ SOLN
INTRAMUSCULAR | Status: DC | PRN
  Administered 2016-08-28: 20 mg via INTRAVENOUS

## 2016-08-28 MED ORDER — FAMOTIDINE IN NACL 20-0.9 MG/50ML-% IV SOLN
20.0000 mg | Freq: Two times a day (BID) | INTRAVENOUS | Status: AC
  Administered 2016-08-28 (×2): 20 mg via INTRAVENOUS
  Filled 2016-08-28: qty 50

## 2016-08-28 MED ORDER — PHENYLEPHRINE HCL 10 MG/ML IJ SOLN
INTRAMUSCULAR | Status: DC | PRN
  Administered 2016-08-28 (×3): 80 ug via INTRAVENOUS
  Administered 2016-08-28: 160 ug via INTRAVENOUS

## 2016-08-28 MED ORDER — ACETAMINOPHEN 160 MG/5ML PO SOLN: 650.0000 mg | Freq: Once | ORAL | Status: AC

## 2016-08-28 MED ORDER — MIDAZOLAM HCL 2 MG/2ML IJ SOLN: 2.0000 mg | INTRAMUSCULAR | Status: DC | PRN

## 2016-08-28 MED ORDER — OXYCODONE HCL 5 MG PO TABS
5.0000 mg | ORAL_TABLET | ORAL | Status: DC | PRN
  Administered 2016-08-29 – 2016-08-30 (×5): 5 mg via ORAL
  Filled 2016-08-28 (×5): qty 1

## 2016-08-28 MED ORDER — PHENYLEPHRINE HCL 10 MG/ML IJ SOLN
0.0000 ug/min | INTRAMUSCULAR | Status: DC
  Administered 2016-08-28: 10 ug/min via INTRAVENOUS
  Filled 2016-08-28 (×2): qty 2

## 2016-08-28 MED ORDER — ASPIRIN EC 325 MG PO TBEC
325.0000 mg | DELAYED_RELEASE_TABLET | Freq: Every day | ORAL | Status: DC
  Administered 2016-08-29 – 2016-08-30 (×2): 325 mg via ORAL
  Filled 2016-08-28 (×2): qty 1

## 2016-08-28 MED ORDER — HEMOSTATIC AGENTS (NO CHARGE) OPTIME
TOPICAL | Status: DC | PRN
  Administered 2016-08-28: 1 via TOPICAL

## 2016-08-28 MED ORDER — PROPOFOL 10 MG/ML IV BOLUS
INTRAVENOUS | Status: AC
  Filled 2016-08-28: qty 20

## 2016-08-28 MED ORDER — MORPHINE SULFATE (PF) 2 MG/ML IV SOLN: 1.0000 mg | INTRAVENOUS | Status: DC | PRN

## 2016-08-28 MED ORDER — CHLORHEXIDINE GLUCONATE 0.12% ORAL RINSE (MEDLINE KIT)
15.0000 mL | Freq: Two times a day (BID) | OROMUCOSAL | Status: DC
  Administered 2016-08-28: 15 mL via OROMUCOSAL

## 2016-08-28 MED ORDER — ASPIRIN 81 MG PO CHEW: 324.0000 mg | CHEWABLE_TABLET | Freq: Every day | ORAL | Status: DC

## 2016-08-28 MED ORDER — CHLORHEXIDINE GLUCONATE 0.12 % MT SOLN
15.0000 mL | OROMUCOSAL | Status: AC
  Administered 2016-08-28: 15 mL via OROMUCOSAL
  Filled 2016-08-28: qty 15

## 2016-08-28 MED ORDER — ALBUMIN HUMAN 5 % IV SOLN
INTRAVENOUS | Status: DC | PRN
  Administered 2016-08-28: 13:00:00 via INTRAVENOUS

## 2016-08-28 MED ORDER — ORAL CARE MOUTH RINSE
15.0000 mL | Freq: Four times a day (QID) | OROMUCOSAL | Status: DC
  Administered 2016-08-28: 15 mL via OROMUCOSAL

## 2016-08-28 MED ORDER — MIDAZOLAM HCL 10 MG/2ML IJ SOLN
INTRAMUSCULAR | Status: AC
  Filled 2016-08-28: qty 2

## 2016-08-28 MED ORDER — LACTATED RINGERS IV SOLN
INTRAVENOUS | Status: DC
  Administered 2016-08-28: 18:00:00 via INTRAVENOUS

## 2016-08-28 MED ORDER — METOPROLOL TARTRATE 5 MG/5ML IV SOLN: 2.5000 mg | INTRAVENOUS | Status: DC | PRN

## 2016-08-28 MED ORDER — SODIUM CHLORIDE 0.9 % IV SOLN
INTRAVENOUS | Status: DC
  Administered 2016-08-28: 14:00:00 via INTRAVENOUS

## 2016-08-28 MED ORDER — DOCUSATE SODIUM 100 MG PO CAPS
200.0000 mg | ORAL_CAPSULE | Freq: Every day | ORAL | Status: DC
  Administered 2016-08-29 – 2016-08-30 (×2): 200 mg via ORAL
  Filled 2016-08-28 (×2): qty 2

## 2016-08-28 MED ORDER — PROPOFOL 10 MG/ML IV BOLUS
INTRAVENOUS | Status: DC | PRN
  Administered 2016-08-28: 50 mg via INTRAVENOUS

## 2016-08-28 MED ORDER — MORPHINE SULFATE (PF) 2 MG/ML IV SOLN: 2.0000 mg | INTRAVENOUS | Status: DC | PRN

## 2016-08-28 MED ORDER — LACTATED RINGERS IV SOLN
INTRAVENOUS | Status: DC | PRN
  Administered 2016-08-28 (×2): via INTRAVENOUS

## 2016-08-28 MED ORDER — MIDAZOLAM HCL 5 MG/5ML IJ SOLN
INTRAMUSCULAR | Status: DC | PRN
  Administered 2016-08-28: 2 mg via INTRAVENOUS
  Administered 2016-08-28: 3 mg via INTRAVENOUS
  Administered 2016-08-28: 1 mg via INTRAVENOUS
  Administered 2016-08-28: 4 mg via INTRAVENOUS

## 2016-08-28 MED ORDER — PHENYLEPHRINE 40 MCG/ML (10ML) SYRINGE FOR IV PUSH (FOR BLOOD PRESSURE SUPPORT)
PREFILLED_SYRINGE | INTRAVENOUS | Status: AC
  Filled 2016-08-28: qty 10

## 2016-08-28 MED ORDER — ACETAMINOPHEN 160 MG/5ML PO SOLN: 1000.0000 mg | Freq: Four times a day (QID) | ORAL | Status: DC

## 2016-08-28 MED ORDER — HEPARIN SODIUM (PORCINE) 1000 UNIT/ML IJ SOLN
INTRAMUSCULAR | Status: AC
  Filled 2016-08-28: qty 1

## 2016-08-28 MED ORDER — 0.9 % SODIUM CHLORIDE (POUR BTL) OPTIME
TOPICAL | Status: DC | PRN
  Administered 2016-08-28: 6000 mL

## 2016-08-28 MED ORDER — SODIUM CHLORIDE 0.9 % IV SOLN
30.0000 meq | Freq: Once | INTRAVENOUS | Status: AC
  Administered 2016-08-28: 30 meq via INTRAVENOUS
  Filled 2016-08-28: qty 15

## 2016-08-28 MED ORDER — EPHEDRINE 5 MG/ML INJ
INTRAVENOUS | Status: AC
  Filled 2016-08-28: qty 10

## 2016-08-28 MED ORDER — BISACODYL 10 MG RE SUPP: 10.0000 mg | Freq: Every day | RECTAL | Status: DC

## 2016-08-28 MED ORDER — AMIODARONE HCL IN DEXTROSE 360-4.14 MG/200ML-% IV SOLN
30.0000 mg/h | INTRAVENOUS | Status: DC
  Administered 2016-08-28 – 2016-08-30 (×3): 30 mg/h via INTRAVENOUS
  Filled 2016-08-28 (×4): qty 200

## 2016-08-28 MED ORDER — LACTATED RINGERS IV SOLN: INTRAVENOUS | Status: DC

## 2016-08-28 MED ORDER — METOPROLOL TARTRATE 25 MG/10 ML ORAL SUSPENSION: 12.5000 mg | Freq: Two times a day (BID) | ORAL | Status: DC

## 2016-08-28 MED ORDER — MORPHINE SULFATE (PF) 4 MG/ML IV SOLN
2.0000 mg | INTRAVENOUS | Status: DC | PRN
  Administered 2016-08-28: 2 mg via INTRAVENOUS
  Administered 2016-08-29: 1 mg via INTRAVENOUS
  Filled 2016-08-28: qty 1

## 2016-08-28 MED ORDER — SODIUM CHLORIDE 0.9% FLUSH: 3.0000 mL | INTRAVENOUS | Status: DC | PRN

## 2016-08-28 MED ORDER — FENTANYL CITRATE (PF) 250 MCG/5ML IJ SOLN
INTRAMUSCULAR | Status: DC | PRN
  Administered 2016-08-28: 50 ug via INTRAVENOUS
  Administered 2016-08-28: 200 ug via INTRAVENOUS
  Administered 2016-08-28: 1000 ug via INTRAVENOUS

## 2016-08-28 MED ORDER — DEXTROSE 5 % IV SOLN
1.5000 g | Freq: Two times a day (BID) | INTRAVENOUS | Status: AC
  Administered 2016-08-28 – 2016-08-30 (×4): 1.5 g via INTRAVENOUS
  Filled 2016-08-28 (×4): qty 1.5

## 2016-08-28 MED ORDER — VANCOMYCIN HCL IN DEXTROSE 1-5 GM/200ML-% IV SOLN
1000.0000 mg | Freq: Once | INTRAVENOUS | Status: AC
  Administered 2016-08-28: 1000 mg via INTRAVENOUS
  Filled 2016-08-28: qty 200

## 2016-08-28 MED ORDER — ACETAMINOPHEN 500 MG PO TABS
1000.0000 mg | ORAL_TABLET | Freq: Four times a day (QID) | ORAL | Status: DC
  Administered 2016-08-29 – 2016-08-31 (×9): 1000 mg via ORAL
  Filled 2016-08-28 (×7): qty 2

## 2016-08-28 MED ORDER — HEPARIN SODIUM (PORCINE) 1000 UNIT/ML IJ SOLN
INTRAMUSCULAR | Status: DC | PRN
  Administered 2016-08-28: 29000 [IU] via INTRAVENOUS

## 2016-08-28 MED ORDER — MAGNESIUM SULFATE 4 GM/100ML IV SOLN
4.0000 g | Freq: Once | INTRAVENOUS | Status: AC
  Administered 2016-08-28: 4 g via INTRAVENOUS
  Filled 2016-08-28: qty 100

## 2016-08-28 MED ORDER — DEXMEDETOMIDINE HCL IN NACL 400 MCG/100ML IV SOLN
INTRAVENOUS | Status: DC | PRN
  Administered 2016-08-28: .5 ug/kg/h via INTRAVENOUS

## 2016-08-28 MED ORDER — ROCURONIUM BROMIDE 10 MG/ML (PF) SYRINGE
PREFILLED_SYRINGE | INTRAVENOUS | Status: AC
  Filled 2016-08-28: qty 15

## 2016-08-28 MED ORDER — ACETAMINOPHEN 650 MG RE SUPP
650.0000 mg | Freq: Once | RECTAL | Status: AC
  Administered 2016-08-28: 650 mg via RECTAL

## 2016-08-28 MED ORDER — PROTAMINE SULFATE 10 MG/ML IV SOLN
INTRAVENOUS | Status: AC
  Filled 2016-08-28: qty 50

## 2016-08-28 MED ORDER — NITROGLYCERIN IN D5W 200-5 MCG/ML-% IV SOLN: 0.0000 ug/min | INTRAVENOUS | Status: DC

## 2016-08-28 MED ORDER — ONDANSETRON HCL 4 MG/2ML IJ SOLN
4.0000 mg | Freq: Four times a day (QID) | INTRAMUSCULAR | Status: DC | PRN
  Administered 2016-08-29: 4 mg via INTRAVENOUS
  Filled 2016-08-28: qty 2

## 2016-08-28 MED ORDER — NITROGLYCERIN 0.2 MG/ML ON CALL CATH LAB
INTRAVENOUS | Status: DC | PRN
  Administered 2016-08-28 (×2): 80 ug via INTRAVENOUS

## 2016-08-28 MED ORDER — AMIODARONE HCL IN DEXTROSE 360-4.14 MG/200ML-% IV SOLN
60.0000 mg/h | INTRAVENOUS | Status: AC
  Administered 2016-08-28: 30 mg/h via INTRAVENOUS
  Filled 2016-08-28: qty 200

## 2016-08-28 MED ORDER — SODIUM CHLORIDE 0.9 % IV SOLN
250.0000 mL | INTRAVENOUS | Status: DC
  Administered 2016-08-29: 250 mL via INTRAVENOUS

## 2016-08-28 MED ORDER — PANTOPRAZOLE SODIUM 40 MG PO TBEC
40.0000 mg | DELAYED_RELEASE_TABLET | Freq: Every day | ORAL | Status: DC
  Administered 2016-08-30: 40 mg via ORAL
  Filled 2016-08-28: qty 1

## 2016-08-28 MED ORDER — SODIUM CHLORIDE 0.9 % IV SOLN
0.0000 ug/kg/h | INTRAVENOUS | Status: DC
  Administered 2016-08-28: 0.5 ug/kg/h via INTRAVENOUS
  Filled 2016-08-28 (×2): qty 2

## 2016-08-28 MED ORDER — PROTAMINE SULFATE 10 MG/ML IV SOLN
INTRAVENOUS | Status: DC | PRN
  Administered 2016-08-28 (×3): 50 mg via INTRAVENOUS
  Administered 2016-08-28: 40 mg via INTRAVENOUS
  Administered 2016-08-28 (×2): 50 mg via INTRAVENOUS

## 2016-08-28 MED ORDER — BISACODYL 5 MG PO TBEC
10.0000 mg | DELAYED_RELEASE_TABLET | Freq: Every day | ORAL | Status: DC
  Administered 2016-08-29 – 2016-08-30 (×2): 10 mg via ORAL
  Filled 2016-08-28 (×2): qty 2

## 2016-08-28 SURGICAL SUPPLY — 77 items
BAG DECANTER FOR FLEXI CONT (MISCELLANEOUS) ×3 IMPLANT
BANDAGE ACE 4X5 VEL STRL LF (GAUZE/BANDAGES/DRESSINGS) ×3 IMPLANT
BANDAGE ACE 6X5 VEL STRL LF (GAUZE/BANDAGES/DRESSINGS) ×3 IMPLANT
BLADE CLIPPER SURG (BLADE) ×3 IMPLANT
BLADE STERNUM SYSTEM 6 (BLADE) ×3 IMPLANT
BLADE SURG 11 STRL SS (BLADE) ×3 IMPLANT
BNDG GAUZE ELAST 4 BULKY (GAUZE/BANDAGES/DRESSINGS) ×3 IMPLANT
CANISTER SUCT 3000ML PPV (MISCELLANEOUS) ×3 IMPLANT
CATH CPB KIT GERHARDT (MISCELLANEOUS) ×3 IMPLANT
CATH THORACIC 28FR (CATHETERS) ×3 IMPLANT
CLIP FOGARTY SPRING 6M (CLIP) ×3 IMPLANT
CRADLE DONUT ADULT HEAD (MISCELLANEOUS) ×3 IMPLANT
DERMABOND ADHESIVE PROPEN (GAUZE/BANDAGES/DRESSINGS) ×1
DERMABOND ADVANCED .7 DNX6 (GAUZE/BANDAGES/DRESSINGS) ×2 IMPLANT
DRAIN CHANNEL 28F RND 3/8 FF (WOUND CARE) ×3 IMPLANT
DRAPE CARDIOVASCULAR INCISE (DRAPES) ×1
DRAPE SLUSH/WARMER DISC (DRAPES) ×3 IMPLANT
DRAPE SRG 135X102X78XABS (DRAPES) ×2 IMPLANT
DRSG AQUACEL AG ADV 3.5X14 (GAUZE/BANDAGES/DRESSINGS) ×3 IMPLANT
ELECT BLADE 4.0 EZ CLEAN MEGAD (MISCELLANEOUS) ×3
ELECT REM PT RETURN 9FT ADLT (ELECTROSURGICAL) ×6
ELECTRODE BLDE 4.0 EZ CLN MEGD (MISCELLANEOUS) ×2 IMPLANT
ELECTRODE REM PT RTRN 9FT ADLT (ELECTROSURGICAL) ×4 IMPLANT
FELT TEFLON 1X6 (MISCELLANEOUS) ×6 IMPLANT
GAUZE SPONGE 4X4 12PLY STRL (GAUZE/BANDAGES/DRESSINGS) ×6 IMPLANT
GAUZE SPONGE 4X4 12PLY STRL LF (GAUZE/BANDAGES/DRESSINGS) ×6 IMPLANT
GLOVE BIO SURGEON STRL SZ 6 (GLOVE) ×6 IMPLANT
GLOVE BIO SURGEON STRL SZ 6.5 (GLOVE) ×15 IMPLANT
GLOVE BIO SURGEON STRL SZ7 (GLOVE) ×18 IMPLANT
GLOVE BIOGEL PI IND STRL 6 (GLOVE) ×6 IMPLANT
GLOVE BIOGEL PI INDICATOR 6 (GLOVE) ×3
GOWN STRL REUS W/ TWL LRG LVL3 (GOWN DISPOSABLE) ×18 IMPLANT
GOWN STRL REUS W/TWL LRG LVL3 (GOWN DISPOSABLE) ×9
HEMOSTAT POWDER SURGIFOAM 1G (HEMOSTASIS) ×9 IMPLANT
HEMOSTAT SURGICEL 2X14 (HEMOSTASIS) ×3 IMPLANT
KIT BASIN OR (CUSTOM PROCEDURE TRAY) ×3 IMPLANT
KIT CATH SUCT 8FR (CATHETERS) ×3 IMPLANT
KIT ROOM TURNOVER OR (KITS) ×3 IMPLANT
KIT SUCTION CATH 14FR (SUCTIONS) ×6 IMPLANT
KIT VASOVIEW HEMOPRO VH 3000 (KITS) ×3 IMPLANT
LEAD PACING MYOCARDI (MISCELLANEOUS) ×3 IMPLANT
MARKER GRAFT CORONARY BYPASS (MISCELLANEOUS) ×9 IMPLANT
NS IRRIG 1000ML POUR BTL (IV SOLUTION) ×18 IMPLANT
PACK OPEN HEART (CUSTOM PROCEDURE TRAY) ×3 IMPLANT
PAD ARMBOARD 7.5X6 YLW CONV (MISCELLANEOUS) ×6 IMPLANT
PAD ELECT DEFIB RADIOL ZOLL (MISCELLANEOUS) ×3 IMPLANT
PENCIL BUTTON HOLSTER BLD 10FT (ELECTRODE) ×3 IMPLANT
PUNCH AORTIC ROTATE  4.5MM 8IN (MISCELLANEOUS) ×3 IMPLANT
SET CARDIOPLEGIA MPS 5001102 (MISCELLANEOUS) ×3 IMPLANT
SOLUTION ANTI FOG 6CC (MISCELLANEOUS) ×3 IMPLANT
SPOGE SURGIFLO 8M (HEMOSTASIS) ×1
SPONGE LAP 18X18 X RAY DECT (DISPOSABLE) ×12 IMPLANT
SPONGE SURGIFLO 8M (HEMOSTASIS) ×2 IMPLANT
SUT BONE WAX W31G (SUTURE) ×3 IMPLANT
SUT MNCRL AB 4-0 PS2 18 (SUTURE) ×3 IMPLANT
SUT PROLENE 3 0 SH1 36 (SUTURE) ×3 IMPLANT
SUT PROLENE 4 0 TF (SUTURE) ×6 IMPLANT
SUT PROLENE 6 0 CC (SUTURE) ×21 IMPLANT
SUT PROLENE 7 0 BV1 MDA (SUTURE) ×6 IMPLANT
SUT PROLENE 8 0 BV175 6 (SUTURE) ×30 IMPLANT
SUT STEEL 6MS V (SUTURE) ×3 IMPLANT
SUT STEEL SZ 6 DBL 3X14 BALL (SUTURE) ×3 IMPLANT
SUT VIC AB 1 CTX 18 (SUTURE) ×6 IMPLANT
SUT VIC AB 2-0 CT1 27 (SUTURE) ×1
SUT VIC AB 2-0 CT1 TAPERPNT 27 (SUTURE) ×2 IMPLANT
SUTURE E-PAK OPEN HEART (SUTURE) ×3 IMPLANT
SYSTEM SAHARA CHEST DRAIN ATS (WOUND CARE) ×3 IMPLANT
TAPE CLOTH SURG 4X10 WHT LF (GAUZE/BANDAGES/DRESSINGS) ×3 IMPLANT
TAPE PAPER 2X10 WHT MICROPORE (GAUZE/BANDAGES/DRESSINGS) ×3 IMPLANT
TOWEL GREEN STERILE (TOWEL DISPOSABLE) ×12 IMPLANT
TOWEL GREEN STERILE FF (TOWEL DISPOSABLE) ×6 IMPLANT
TOWEL OR 17X24 6PK STRL BLUE (TOWEL DISPOSABLE) ×6 IMPLANT
TOWEL OR 17X26 10 PK STRL BLUE (TOWEL DISPOSABLE) ×6 IMPLANT
TRAY FOLEY SILVER 16FR TEMP (SET/KITS/TRAYS/PACK) ×3 IMPLANT
TUBING INSUFFLATION (TUBING) ×3 IMPLANT
UNDERPAD 30X30 (UNDERPADS AND DIAPERS) ×3 IMPLANT
WATER STERILE IRR 1000ML POUR (IV SOLUTION) ×6 IMPLANT

## 2016-08-28 NOTE — Anesthesia Procedure Notes (Signed)
Procedure Name: Intubation Date/Time: 08/28/2016 7:55 AM Performed by: Teressa Lower Pre-anesthesia Checklist: Patient identified, Emergency Drugs available, Suction available and Patient being monitored Patient Re-evaluated:Patient Re-evaluated prior to inductionOxygen Delivery Method: Circle system utilized Preoxygenation: Pre-oxygenation with 100% oxygen Intubation Type: IV induction Ventilation: Mask ventilation without difficulty Laryngoscope Size: Mac and 4 Grade View: Grade I Tube type: Oral Tube size: 8.0 mm Number of attempts: 1 Airway Equipment and Method: Stylet and Oral airway Placement Confirmation: ETT inserted through vocal cords under direct vision,  positive ETCO2 and breath sounds checked- equal and bilateral Secured at: 22 cm Tube secured with: Tape Dental Injury: Teeth and Oropharynx as per pre-operative assessment

## 2016-08-28 NOTE — Progress Notes (Signed)
RT completed an recruitment maneuver on pt due to desat.   PC 35, R 10, +5, iT 3.00, 100% for two minutes.  Sat recovered from 88 to 98%.

## 2016-08-28 NOTE — Progress Notes (Signed)
Rapid wean terminated due to pt unable to meet mechanics. Measurements not within half of what is needed. Pt appears to still be to sleepy. Able to follow commands but very quick to fall back asleep. Will attempt again after some time. Will continue to monitor closely. Eleonore Chiquito RN 2 Heart

## 2016-08-28 NOTE — Brief Op Note (Signed)
08/25/2016 - 08/28/2016  11:29 AM  PATIENT:  Victor Gould  76 y.o. male  PRE-OPERATIVE DIAGNOSIS:  CAD  POST-OPERATIVE DIAGNOSIS:  CAD  PROCEDURE:  Procedure(s):  CORONARY ARTERY BYPASS GRAFTING x 5 -LIMA to LAD -SVG to OM -SVG to DIAGONAL -SEQ SVG to PDA, PLVB  ENDOSCOPIC HARVEST GREATER SAPHENOUS VEIN  -Right Leg  TRANSESOPHAGEAL ECHOCARDIOGRAM (TEE) (N/A)  SURGEON:  Surgeon(s) and Role:    * [name], MD - Primary  PHYSICIAN ASSISTANT: Rollande Thursby PA-C  ANESTHESIA:   general  EBL:  Total I/O In: -  Out: 400 [Urine:400]  BLOOD ADMINISTERED: CELLSAVER  DRAINS: Left Pleural Chest Tube, Mediastinal Chest Dfains   LOCAL MEDICATIONS USED:  NONE  SPECIMEN:  No Specimen  DISPOSITION OF SPECIMEN:  N/A  COUNTS:  YES  TOURNIQUET:  * No tourniquets in log *  DICTATION: .Dragon Dictation  PLAN OF CARE: Admit to inpatient   PATIENT DISPOSITION:  ICU - intubated and hemodynamically stable.   Delay start of Pharmacological VTE agent (>24hrs) due to surgical blood loss or risk of bleeding: yes

## 2016-08-28 NOTE — Progress Notes (Signed)
[place] for Heparin Indication: chest pain/ACS  Allergies  Allergen Reactions  . Naproxen Sodium Shortness Of Breath  . Tramadol Other (See Comments)    dizziness  . Sulfa Antibiotics Rash    Patient Measurements: Height: 5\' 9"  (175.3 cm) Weight: 175 lb 9.6 oz (79.7 kg) IBW/kg (Calculated) : 70.7 Vital Signs: Temp: 98 F (36.7 C) (04/30 2059) Temp Source: Oral (04/30 2059) BP: 139/67 (04/30 2059) Pulse Rate: 59 (04/30 2059)  Labs:  Recent Labs  08/25/16 1036 08/25/16 1609  08/25/16 2122 08/26/16 0449 08/27/16 0253 08/27/16 0611 08/28/16 0107  HGB 14.6  --   --   --  14.4 13.8  --   --   HCT 42.4  --   --   --  42.4 40.9  --   --   PLT 252  --   --   --  233 202  --   --   LABPROT  --   --   --   --   --  14.6  --   --   INR  --   --   --   --   --  1.13  --   --   HEPARINUNFRC  --   --   < >  --  0.33 0.31  --  0.25*  CREATININE 1.63*  --   --   --  1.54*  --  1.62*  --   TROPONINI  --  3.67*  --  9.32* 11.86*  --   --   --   < > = values in this interval not displayed.  Estimated Creatinine Clearance: 38.2 mL/min (A) (by C-G formula based on SCr of 1.62 mg/dL (H)).   Assessment 76 y.o. male with CAD s/p cath and awaiting CABG for heparin   Goal of Therapy:  Heparin level 0.3-0.7 units/ml Monitor platelets by anticoagulation protocol: Yes   Plan:  Increase Heparin  1250 units/hr F/U after CABG  Tovah Slavick, Bronson Curb 08/28/2016,1:55 AM

## 2016-08-28 NOTE — Progress Notes (Signed)
Attempted wean with patient. Patient tolerated well but was unable to get within limits on parameters. Per RN RT switched patient back to full support. Patient still very sleepy. Will re-attempt at a later time.

## 2016-08-28 NOTE — OR Nursing (Signed)
12:40 - 45 minute call to SICU charge nurse 13:05 - 20 minute call to SICU charge nurse

## 2016-08-28 NOTE — Anesthesia Postprocedure Evaluation (Signed)
Anesthesia Post Note  Patient: LONNELL CHAPUT  Procedure(s) Performed: Procedure(s) (LRB): CORONARY ARTERY BYPASS GRAFTING (CABG) x five, using left internal mammary artery and right leg greater saphenous vein harvested endoscopically - -LIMA to LAD, -SVG to OM, -SVG to DIAGONAL, -SEQ SVG to PDA, PLVB  (N/A) TRANSESOPHAGEAL ECHOCARDIOGRAM (TEE) (N/A)  Patient location during evaluation: SICU Anesthesia Type: General Level of consciousness: sedated Pain management: pain level controlled Vital Signs Assessment: post-procedure vital signs reviewed and stable Respiratory status: patient remains intubated per anesthesia plan Cardiovascular status: stable Anesthetic complications: no       Last Vitals:  Vitals:   08/28/16 0500 08/28/16 1348  BP:    Pulse:  83  Resp:  18  Temp: 36.5 C     Last Pain:  Vitals:   08/28/16 0500  TempSrc: Oral  PainSc:                  Temara Lanum DAVID

## 2016-08-28 NOTE — Anesthesia Procedure Notes (Signed)
Central Venous Catheter Insertion Performed by: Catalina Gravel, anesthesiologist Start/End5/04/2016 6:30 AM, 08/28/2016 6:40 AM Patient location: Pre-op. Preanesthetic checklist: patient identified, IV checked, site marked, risks and benefits discussed, surgical consent, monitors and equipment checked, pre-op evaluation, timeout performed and anesthesia consent Position: Trendelenburg Lidocaine 1% used for infiltration and patient sedated Hand hygiene performed  and maximum sterile barriers used  Catheter size: 8.5 Fr Sheath introducer Procedure performed using ultrasound guided technique. Ultrasound Notes:anatomy identified, needle tip was noted to be adjacent to the nerve/plexus identified, no ultrasound evidence of intravascular and/or intraneural injection and image(s) printed for medical record Attempts: 1 Following insertion, line sutured and dressing applied. Post procedure assessment: blood return through all ports, free fluid flow and no air  Patient tolerated the procedure well with no immediate complications.

## 2016-08-28 NOTE — Progress Notes (Signed)
Recruitment maneuver completed due to desat of 88%. PC 35, R 10, Peep 5, FIO2 100%, iT 3.00 for two minutes. Sat improved to 99%.

## 2016-08-28 NOTE — Progress Notes (Signed)
CT surgery p.m. Rounds  Stable after multivessel CABG Remains sedated on ventilator-Precedex is soft Good urine output and stable hemodynamics And we'll chest tube drainage Continue current care and weaning ventilator after patient is more awake and appropriate

## 2016-08-28 NOTE — Progress Notes (Signed)
  Echocardiogram Echocardiogram Transesophageal has been performed.  Victor Gould 08/28/2016, 10:13 AM

## 2016-08-28 NOTE — Transfer of Care (Signed)
Immediate Anesthesia Transfer of Care Note  Patient: PERFECTO PURDY  Procedure(s) Performed: Procedure(s): CORONARY ARTERY BYPASS GRAFTING (CABG) x five, using left internal mammary artery and right leg greater saphenous vein harvested endoscopically - -LIMA to LAD, -SVG to OM, -SVG to DIAGONAL, -SEQ SVG to PDA, PLVB  (N/A) TRANSESOPHAGEAL ECHOCARDIOGRAM (TEE) (N/A)  Patient Location: ICU  Anesthesia Type:General  Level of Consciousness: sedated and unresponsive  Airway & Oxygen Therapy: Patient remains intubated per anesthesia plan and Patient placed on Ventilator (see vital sign flow sheet for setting)  Post-op Assessment: Report given to RN and Post -op Vital signs reviewed and stable  Post vital signs: Reviewed and stable  Last Vitals:  Vitals:   08/28/16 0451 08/28/16 0500  BP:    Pulse:    Resp: 17   Temp:  36.5 C    Last Pain:  Vitals:   08/28/16 0500  TempSrc: Oral  PainSc:          Complications: No apparent anesthesia complications

## 2016-08-28 NOTE — Anesthesia Procedure Notes (Signed)
Arterial Line Insertion Start/End5/04/2016 6:35 AM, 08/28/2016 6:45 AM Performed by: Teressa Lower, CRNA  Preanesthetic checklist: patient identified, IV checked, site marked, risks and benefits discussed, surgical consent, monitors and equipment checked, pre-op evaluation and timeout performed Lidocaine 1% used for infiltration Left, radial was placed Catheter size: 20 G Hand hygiene performed , maximum sterile barriers used  and Seldinger technique used Allen's test indicative of satisfactory collateral circulation Attempts: 2 Procedure performed without using ultrasound guided technique.

## 2016-08-28 NOTE — Anesthesia Preprocedure Evaluation (Addendum)
Anesthesia Evaluation  Patient identified by MRN, date of birth, ID band Patient awake    Reviewed: Allergy & Precautions, NPO status , Patient's Chart, lab work & pertinent test results  History of Anesthesia Complications (+) PONV  Airway Mallampati: II  TM Distance: >3 FB Neck ROM: Full    Dental  (+) Teeth Intact, Dental Advisory Given   Pulmonary    Pulmonary exam normal        Cardiovascular hypertension, Pt. on medications and Pt. on home beta blockers + Past MI  Normal cardiovascular exam     Neuro/Psych    GI/Hepatic GERD  Medicated and Controlled,  Endo/Other  diabetes, Type 2, Insulin Dependent  Renal/GU      Musculoskeletal   Abdominal   Peds  Hematology   Anesthesia Other Findings   Reproductive/Obstetrics                            Anesthesia Physical Anesthesia Plan  ASA: III  Anesthesia Plan: General   Post-op Pain Management:    Induction: Intravenous  Airway Management Planned: Oral ETT  Additional Equipment: Arterial line, PA Cath, CVP, TEE and Ultrasound Guidance Line Placement  Intra-op Plan:   Post-operative Plan: Post-operative intubation/ventilation  Informed Consent: I have reviewed the patients History and Physical, chart, labs and discussed the procedure including the risks, benefits and alternatives for the proposed anesthesia with the patient or authorized representative who has indicated his/her understanding and acceptance.   Dental advisory given  Plan Discussed with: CRNA and Surgeon  Anesthesia Plan Comments:        Anesthesia Quick Evaluation

## 2016-08-28 NOTE — Progress Notes (Addendum)
Pt complained of feeling hypoglycemic. Pt given 2 cups of apple juice around 00:15. First BG check was 67. Follow up BG 121. Pt states he's feeling better. Will continue to monitor.

## 2016-08-28 NOTE — Anesthesia Procedure Notes (Signed)
Central Venous Catheter Insertion Performed by: Catalina Gravel, anesthesiologist Start/End5/04/2016 6:40 AM, 08/28/2016 6:43 AM Patient location: Pre-op. Preanesthetic checklist: patient identified, IV checked, site marked, risks and benefits discussed, surgical consent, monitors and equipment checked, pre-op evaluation, timeout performed and anesthesia consent Hand hygiene performed  and maximum sterile barriers used  Total catheter length 90. PA cath was placed.Swan type:thermodilution PA Cath depth:55 Procedure performed using ultrasound guided technique. Ultrasound Notes:anatomy identified, needle tip was noted to be adjacent to the nerve/plexus identified, no ultrasound evidence of intravascular and/or intraneural injection and image(s) printed for medical record Attempts: 1 Patient tolerated the procedure well with no immediate complications.

## 2016-08-28 NOTE — Progress Notes (Signed)
Pre Procedure note for inpatients:   Victor Gould has been scheduled for Procedure(s): CORONARY ARTERY BYPASS GRAFTING (CABG) (N/A) TRANSESOPHAGEAL ECHOCARDIOGRAM (TEE) (N/A) today. The various methods of treatment have been discussed with the patient. After consideration of the risks, benefits and treatment options the patient has consented to the planned procedure.   The patient has been seen and labs reviewed. There are no changes in the patient's condition to prevent proceeding with the planned procedure today.  Recent labs:  Lab Results  Component Value Date   WBC 7.2 08/28/2016   HGB 13.9 08/28/2016   HCT 41.6 08/28/2016   PLT 196 08/28/2016   GLUCOSE 131 (H) 08/28/2016   CHOL 164 08/26/2016   TRIG 84 08/26/2016   HDL 44 08/26/2016   LDLCALC 103 (H) 08/26/2016   ALT 17 08/28/2016   AST 27 08/28/2016   NA 138 08/28/2016   K 3.9 08/28/2016   CL 110 08/28/2016   CREATININE 1.52 (H) 08/28/2016   BUN 16 08/28/2016   CO2 22 08/28/2016   INR 1.07 08/28/2016   HGBA1C 7.7 (H) 08/27/2016   Cr stable   [name], MD 08/28/2016 7:17 AM

## 2016-08-28 NOTE — OR Nursing (Signed)
73 Dr. Servando Snare made aware Hgb 8.6, Hct 25.4, and Plt count is 156.

## 2016-08-29 ENCOUNTER — Encounter (HOSPITAL_COMMUNITY): Payer: Self-pay | Admitting: Cardiothoracic Surgery

## 2016-08-29 ENCOUNTER — Inpatient Hospital Stay (HOSPITAL_COMMUNITY): Payer: Medicare Other

## 2016-08-29 LAB — GLUCOSE, CAPILLARY
Glucose-Capillary: 102 mg/dL — ABNORMAL HIGH (ref 65–99)
Glucose-Capillary: 105 mg/dL — ABNORMAL HIGH (ref 65–99)
Glucose-Capillary: 106 mg/dL — ABNORMAL HIGH (ref 65–99)
Glucose-Capillary: 107 mg/dL — ABNORMAL HIGH (ref 65–99)
Glucose-Capillary: 108 mg/dL — ABNORMAL HIGH (ref 65–99)
Glucose-Capillary: 112 mg/dL — ABNORMAL HIGH (ref 65–99)
Glucose-Capillary: 119 mg/dL — ABNORMAL HIGH (ref 65–99)
Glucose-Capillary: 121 mg/dL — ABNORMAL HIGH (ref 65–99)
Glucose-Capillary: 126 mg/dL — ABNORMAL HIGH (ref 65–99)
Glucose-Capillary: 128 mg/dL — ABNORMAL HIGH (ref 65–99)
Glucose-Capillary: 134 mg/dL — ABNORMAL HIGH (ref 65–99)
Glucose-Capillary: 135 mg/dL — ABNORMAL HIGH (ref 65–99)
Glucose-Capillary: 136 mg/dL — ABNORMAL HIGH (ref 65–99)
Glucose-Capillary: 140 mg/dL — ABNORMAL HIGH (ref 65–99)
Glucose-Capillary: 154 mg/dL — ABNORMAL HIGH (ref 65–99)
Glucose-Capillary: 174 mg/dL — ABNORMAL HIGH (ref 65–99)
Glucose-Capillary: 86 mg/dL (ref 65–99)
Glucose-Capillary: 94 mg/dL (ref 65–99)

## 2016-08-29 LAB — POCT I-STAT 3, ART BLOOD GAS (G3+)
Acid-base deficit: 4 mmol/L — ABNORMAL HIGH (ref 0.0–2.0)
Acid-base deficit: 4 mmol/L — ABNORMAL HIGH (ref 0.0–2.0)
Acid-base deficit: 6 mmol/L — ABNORMAL HIGH (ref 0.0–2.0)
Bicarbonate: 18.5 mmol/L — ABNORMAL LOW (ref 20.0–28.0)
Bicarbonate: 19.3 mmol/L — ABNORMAL LOW (ref 20.0–28.0)
Bicarbonate: 19.6 mmol/L — ABNORMAL LOW (ref 20.0–28.0)
O2 Saturation: 93 %
O2 Saturation: 94 %
O2 Saturation: 97 %
Patient temperature: 36.9
Patient temperature: 36.9
Patient temperature: 37.1
TCO2: 19 mmol/L (ref 0–100)
TCO2: 20 mmol/L (ref 0–100)
TCO2: 21 mmol/L (ref 0–100)
pCO2 arterial: 29.9 mmHg — ABNORMAL LOW (ref 32.0–48.0)
pCO2 arterial: 30.2 mmHg — ABNORMAL LOW (ref 32.0–48.0)
pCO2 arterial: 30.5 mmHg — ABNORMAL LOW (ref 32.0–48.0)
pH, Arterial: 7.391 (ref 7.350–7.450)
pH, Arterial: 7.418 (ref 7.350–7.450)
pH, Arterial: 7.42 (ref 7.350–7.450)
pO2, Arterial: 65 mmHg — ABNORMAL LOW (ref 83.0–108.0)
pO2, Arterial: 70 mmHg — ABNORMAL LOW (ref 83.0–108.0)
pO2, Arterial: 82 mmHg — ABNORMAL LOW (ref 83.0–108.0)

## 2016-08-29 LAB — HEPATIC FUNCTION PANEL
ALT: 16 U/L — ABNORMAL LOW (ref 17–63)
AST: 36 U/L (ref 15–41)
Albumin: 3.2 g/dL — ABNORMAL LOW (ref 3.5–5.0)
Alkaline Phosphatase: 30 U/L — ABNORMAL LOW (ref 38–126)
Bilirubin, Direct: 0.3 mg/dL (ref 0.1–0.5)
Indirect Bilirubin: 0.6 mg/dL (ref 0.3–0.9)
Total Bilirubin: 0.9 mg/dL (ref 0.3–1.2)
Total Protein: 4.9 g/dL — ABNORMAL LOW (ref 6.5–8.1)

## 2016-08-29 LAB — POCT I-STAT, CHEM 8
BUN: 17 mg/dL (ref 6–20)
Calcium, Ion: 1.17 mmol/L (ref 1.15–1.40)
Chloride: 103 mmol/L (ref 101–111)
Creatinine, Ser: 1.6 mg/dL — ABNORMAL HIGH (ref 0.61–1.24)
Glucose, Bld: 162 mg/dL — ABNORMAL HIGH (ref 65–99)
HCT: 31 % — ABNORMAL LOW (ref 39.0–52.0)
Hemoglobin: 10.5 g/dL — ABNORMAL LOW (ref 13.0–17.0)
Potassium: 4 mmol/L (ref 3.5–5.1)
Sodium: 137 mmol/L (ref 135–145)
TCO2: 23 mmol/L (ref 0–100)

## 2016-08-29 LAB — BASIC METABOLIC PANEL
ANION GAP: 9 (ref 5–15)
BUN: 13 mg/dL (ref 6–20)
CO2: 19 mmol/L — AB (ref 22–32)
Calcium: 7.9 mg/dL — ABNORMAL LOW (ref 8.9–10.3)
Chloride: 109 mmol/L (ref 101–111)
Creatinine, Ser: 1.46 mg/dL — ABNORMAL HIGH (ref 0.61–1.24)
GFR calc Af Amer: 52 mL/min — ABNORMAL LOW (ref >60)
GFR calc non Af Amer: 45 mL/min — ABNORMAL LOW (ref >60)
GLUCOSE: 114 mg/dL — AB (ref 65–99)
POTASSIUM: 4.1 mmol/L (ref 3.5–5.1)
Sodium: 137 mmol/L (ref 135–145)

## 2016-08-29 LAB — MAGNESIUM
Magnesium: 2.2 mg/dL (ref 1.7–2.4)
Magnesium: 2.2 mg/dL (ref 1.7–2.4)

## 2016-08-29 LAB — CBC
HEMATOCRIT: 30.6 % — AB (ref 39.0–52.0)
HEMATOCRIT: 31.5 % — AB (ref 39.0–52.0)
HEMOGLOBIN: 10.4 g/dL — AB (ref 13.0–17.0)
Hemoglobin: 10.3 g/dL — ABNORMAL LOW (ref 13.0–17.0)
MCH: 30 pg (ref 26.0–34.0)
MCH: 30 pg (ref 26.0–34.0)
MCHC: 33.7 g/dL (ref 30.0–36.0)
MCHC: 33 g/dL (ref 30.0–36.0)
MCV: 89.2 fL (ref 78.0–100.0)
MCV: 90.8 fL (ref 78.0–100.0)
Platelets: 129 10*3/uL — ABNORMAL LOW (ref 150–400)
Platelets: 150 10*3/uL (ref 150–400)
RBC: 3.43 MIL/uL — AB (ref 4.22–5.81)
RBC: 3.47 MIL/uL — AB (ref 4.22–5.81)
RDW: 14 % (ref 11.5–15.5)
RDW: 14.3 % (ref 11.5–15.5)
WBC: 10.7 10*3/uL — AB (ref 4.0–10.5)
WBC: 13.8 10*3/uL — ABNORMAL HIGH (ref 4.0–10.5)

## 2016-08-29 LAB — HEMOGLOBIN A1C
Hgb A1c MFr Bld: 7.7 % — ABNORMAL HIGH (ref 4.8–5.6)
Mean Plasma Glucose: 174 mg/dL

## 2016-08-29 LAB — CREATININE, SERUM
Creatinine, Ser: 1.74 mg/dL — ABNORMAL HIGH (ref 0.61–1.24)
GFR calc non Af Amer: 36 mL/min — ABNORMAL LOW (ref >60)
GFR, EST AFRICAN AMERICAN: 42 mL/min — AB (ref >60)

## 2016-08-29 LAB — MRSA PCR SCREENING: MRSA by PCR: NEGATIVE

## 2016-08-29 LAB — TSH: TSH: 1.868 u[IU]/mL (ref 0.350–4.500)

## 2016-08-29 MED ORDER — FUROSEMIDE 10 MG/ML IJ SOLN
40.0000 mg | Freq: Once | INTRAMUSCULAR | Status: AC
  Administered 2016-08-29: 40 mg via INTRAVENOUS
  Filled 2016-08-29: qty 4

## 2016-08-29 MED ORDER — INSULIN DETEMIR 100 UNIT/ML ~~LOC~~ SOLN
20.0000 [IU] | Freq: Every day | SUBCUTANEOUS | Status: DC
  Administered 2016-08-29 – 2016-08-30 (×2): 20 [IU] via SUBCUTANEOUS
  Filled 2016-08-29 (×3): qty 0.2

## 2016-08-29 MED ORDER — ENOXAPARIN SODIUM 30 MG/0.3ML ~~LOC~~ SOLN
30.0000 mg | Freq: Every day | SUBCUTANEOUS | Status: DC
  Administered 2016-08-29 – 2016-09-02 (×5): 30 mg via SUBCUTANEOUS
  Filled 2016-08-29 (×5): qty 0.3

## 2016-08-29 MED ORDER — INSULIN DETEMIR 100 UNIT/ML ~~LOC~~ SOLN: 20.0000 [IU] | Freq: Every day | SUBCUTANEOUS | Status: DC

## 2016-08-29 MED ORDER — ORAL CARE MOUTH RINSE
15.0000 mL | Freq: Two times a day (BID) | OROMUCOSAL | Status: DC
  Administered 2016-08-30 – 2016-09-02 (×4): 15 mL via OROMUCOSAL

## 2016-08-29 MED ORDER — INSULIN ASPART 100 UNIT/ML ~~LOC~~ SOLN
0.0000 [IU] | SUBCUTANEOUS | Status: DC
  Administered 2016-08-29 (×2): 2 [IU] via SUBCUTANEOUS
  Administered 2016-08-30: 4 [IU] via SUBCUTANEOUS

## 2016-08-29 MED FILL — Mannitol IV Soln 20%: INTRAVENOUS | Qty: 500 | Status: AC

## 2016-08-29 MED FILL — Heparin Sodium (Porcine) Inj 1000 Unit/ML: INTRAMUSCULAR | Qty: 90 | Status: AC

## 2016-08-29 MED FILL — Lidocaine HCl IV Inj 20 MG/ML: INTRAVENOUS | Qty: 5 | Status: AC

## 2016-08-29 MED FILL — Potassium Chloride Inj 2 mEq/ML: INTRAVENOUS | Qty: 40 | Status: AC

## 2016-08-29 MED FILL — Sodium Chloride IV Soln 0.9%: INTRAVENOUS | Qty: 2000 | Status: AC

## 2016-08-29 MED FILL — Heparin Sodium (Porcine) Inj 1000 Unit/ML: INTRAMUSCULAR | Qty: 30 | Status: AC

## 2016-08-29 MED FILL — Sodium Bicarbonate IV Soln 8.4%: INTRAVENOUS | Qty: 50 | Status: AC

## 2016-08-29 MED FILL — Magnesium Sulfate Inj 50%: INTRAMUSCULAR | Qty: 20 | Status: AC

## 2016-08-29 MED FILL — Electrolyte-R (PH 7.4) Solution: INTRAVENOUS | Qty: 1000 | Status: AC

## 2016-08-29 NOTE — Plan of Care (Signed)
Problem: Activity: Goal: Risk for activity intolerance will decrease Outcome: Progressing Pt to be up oob in AM  Problem: Cardiac: Goal: Hemodynamic stability will improve Outcome: Progressing Within parameters Goal: Ability to maintain an adequate cardiac output will improve Outcome: Progressing Within parameters  Problem: Nutritional: Goal: Risk for body nutrition deficit will decrease Outcome: Progressing Tolerating clears  Problem: Urinary Elimination: Goal: Ability to achieve and maintain adequate renal perfusion and functioning will improve Outcome: Progressing Within parameters

## 2016-08-29 NOTE — Procedures (Signed)
Extubation Procedure Note  Patient Details:   Name: Victor Gould DOB: [birth year] MRN: [id number]   Airway Documentation:     Evaluation  O2 sats: stable throughout Complications: No apparent complications Patient did tolerate procedure well. Bilateral Breath Sounds: Clear, Diminished   Yes   Patient extubated to [facility] without any complications. Patient able to speak and has adequate cough.  Blanchie Serve 08/29/2016, 12:23 AM

## 2016-08-29 NOTE — Op Note (Signed)
NAME:  Victor Gould, Victor Gould NO.:  [id number]  MEDICAL RECORD NO.:  [id number]  LOCATION:  [id number]                        FACILITY:  [facility]  PHYSICIAN:  Lanelle Bal, MD    DATE OF BIRTH:  February 10, 1941  DATE OF PROCEDURE:  08/28/2016 DATE OF DISCHARGE:                              OPERATIVE REPORT   PREOPERATIVE DIAGNOSIS:  Coronary occlusive disease with unstable angina.  POSTOPERATIVE DIAGNOSIS:  Coronary occlusive disease with unstable angina.  SURGICAL PROCEDURE:  Coronary artery bypass grafting x5 with the left internal mammary to the left anterior descending coronary artery, reverse saphenous vein graft to the diagonal coronary artery, reverse saphenous vein graft to the obtuse marginal coronary artery, sequential reverse saphenous vein graft to the posterior descending and posterior branches of the right coronary artery with right leg greater saphenous endo vein harvesting, thigh and calf.  SURGEON:  Lanelle Bal, MD.  FIRST ASSISTANT:  Providence Crosby, PA.  BRIEF HISTORY:  The patient is a 76 year old male with stage 3 renal insufficiency, who presents with unstable anginal symptoms.  He was initially stabilized medically and then underwent cardiac catheterization by Dr. Daneen Schick, which demonstrated total occlusion of the mid LAD.  A large diagonal with collateral filling to the distal LAD with high-grade proximal stenosis of greater than 90%.  The patient had a small circumflex system with a moderate-size obtuse marginal with 70-80% disease.  The right coronary artery was a large dominant vessel with disease both in the midportion of vessel and also disease in the posterior lateral and posterior descending branches.  Because of the patient's overall ventricular function appeared preserved, because of the patient's severe 3-vessel coronary artery disease, unstable anginal symptoms, and critical anatomy, coronary artery bypass grafting was recommended.   The patient agreed and signed informed consent.  DESCRIPTION OF PROCEDURE:  With Swan-Ganz and arterial line monitors in place, the patient underwent general endotracheal anesthesia without incident.  The skin of chest and legs was prepped with Betadine and draped in usual sterile manner.  Using the Guidant endo vein harvesting system and after appropriate time-out was performed, we harvested endoscopically the right greater saphenous vein thigh and calf.  The vein was of excellent quality and caliber.  Median sternotomy was performed.  The left internal mammary artery was dissected down as a pedicle graft.  The distal artery was of good quality.  The pericardium was opened.  Overall, ventricular function appeared preserved.  The patient was systemically heparinized.  The ascending aorta was cannulated.  The right atrium was cannulated.  An aortic root vent cardioplegia needle was introduced into the ascending aorta.  The patient was placed on cardiopulmonary bypass at 2.4 L/min/sq m.  Sites of anastomosis were selected and dissected out of the epicardium.  The patient's body temperature was cooled to 32 degrees.  The patient did have during cannulation several episodes of atrial fibrillation, and amiodarone drip was started.  The aortic crossclamp was applied and 700 mL of cold blood cardioplegia was administered with diastolic arrest of the heart.  Myocardial septal temperature was monitored throughout the crossclamp.  Attention was turned first to the posterior descending branch of the right coronary artery, which was opened in  the proximal third of the vessel.  Using a 1.5 mm probe passed distally, using a diamond-type, side-to-side anastomosis was carried out with a running 7- 0 Prolene, with the second reverse saphenous vein graft.  Distal extent of the same vein was then carried to the posterior lateral branch, which was opened and was of similar size.  Using a running 7-0  Prolene, distal anastomosis was performed.  Additional cold blood cardioplegia was administered down the vein grafts.  Attention was then turned to the obtuse marginal, which was a relatively small vessel, partially intramyocardial.  The vessel was opened, admitted a 1.5 mm probe distally.  Using a running 7-0 Prolene, a distal anastomosis was performed with second reverse saphenous vein graft.  Attention was then turned to the diagonal coronary artery, which was opened, admitted 1.5 mm probe distally.  Using a running 7-0 Prolene, a distal anastomosis was performed with a second reverse saphenous vein graft.  The distal third of the LAD vessel was opened, the LAD was of poor quality with diffusely thickened and diseased vessel.  The distal vessel was 1 mm in size.  The more proximal vessel was slightly larger and did admit a 1.5 mm probe.  Using a running 8-0 Prolene, the left internal mammary artery was anastomosed to left anterior descending coronary artery.  With the crossclamp still in place, three punch aortotomies were performed and each of the 3 vein grafts anastomosed to the ascending aorta.  Bulldog was removed from the mammary artery with rise in myocardial septal temperature.  The heart was allowed to passively fill and de-air.  The aortic crossclamp was removed.  Total crossclamp time of 102 minutes. Sites of anastomosis were inspected.  The patient spontaneously converted to a sinus rhythm.  With the body temperature rewarmed to 37 degrees, the patient had atrial and ventricular pacing wires applied. The patient was atrially paced, increased rate, was then ventilated and weaned from cardiopulmonary bypass without difficulty.  He was decannulated in usual fashion.  Protamine was administered.  After decannulation, graft markers were applied.  The patient remained hemodynamically stable.  Left pleural tube and a Blake mediastinal drain were left in place.  Perhaps markers were  applied.  Pericardium was loosely reapproximated.  Sternum was closed with #6 stainless steel wire.  Fascia was closed with interrupted 0 Vicryl, running 3-0 Vicryl in subcutaneous tissue, 4-0 subcuticular stitch in skin edges.  Dry dressings were applied.  Sponge and needle count was reported as correct at the completion of the procedure.  The patient tolerated the procedure without obvious complication and was transferred to the surgical intensive care unit for further postoperative care.  The patient did not require any blood bank blood products during the operative procedure. Total pump time was 155 minutes.     Lanelle Bal, MD     EG/MEDQ  D:  08/28/2016  T:  08/29/2016  Job:  [id number]

## 2016-08-29 NOTE — Progress Notes (Signed)
Patient ID: Victor Gould, male   DOB: [birth year], 76 y.o.   MRN: [id number] TCTS DAILY ICU PROGRESS NOTE                   [address]            , [address]          [phone]   1 Day Post-Op Procedure(s) (LRB): CORONARY ARTERY BYPASS GRAFTING (CABG) x five, using left internal mammary artery and right leg greater saphenous vein harvested endoscopically - -LIMA to LAD, -SVG to OM, -SVG to DIAGONAL, -SEQ SVG to PDA, PLVB  (N/A) TRANSESOPHAGEAL ECHOCARDIOGRAM (TEE) (N/A)  Total Length of Stay:  LOS: 3 days   Subjective: Awake extubated last pm, neuro intact  Objective: Vital signs in last 24 hours: Temp:  [96.1 F (35.6 C)-98.8 F (37.1 C)] 98.2 F (36.8 C) (05/02 0700) Pulse Rate:  [73-90] 73 (05/02 0700) Cardiac Rhythm: Normal sinus rhythm (05/02 0700) Resp:  [12-24] 15 (05/02 0700) BP: (91-145)/(60-105) 107/60 (05/02 0700) SpO2:  [90 %-100 %] 94 % (05/02 0700) Arterial Line BP: (87-161)/(48-84) 137/53 (05/02 0700) FiO2 (%):  [40 %-50 %] 40 % (05/02 0000) Weight:  [192 lb 10.9 oz (87.4 kg)] 192 lb 10.9 oz (87.4 kg) (05/02 0144)  Filed Weights   08/26/16 0435 08/28/16 0500 08/29/16 0144  Weight: 175 lb 9.6 oz (79.7 kg) 177 lb 14.4 oz (80.7 kg) 192 lb 10.9 oz (87.4 kg)    Weight change: 14 lb 12.5 oz (6.705 kg)   Hemodynamic parameters for last 24 hours: PAP: (14-29)/(4-17) 25/4 CO:  [3.1 L/min-4.1 L/min] 4.1 L/min CI:  [1.6 L/min/m2-2.1 L/min/m2] 2.1 L/min/m2  Intake/Output from previous day: 05/01 0701 - 05/02 0700 In: 6657.6 [I.V.:4177.6; Blood:435; NG/GT:30; IV Piggyback:2015] Out: 9323 [Urine:2545; Blood:600; Chest Tube:290]  Intake/Output this shift: No intake/output data recorded.  Current Meds: Scheduled Meds: . acetaminophen  1,000 mg Oral Q6H   Or  . acetaminophen (TYLENOL) oral liquid 160 mg/5 mL  1,000 mg Per Tube Q6H  . aspirin EC  325 mg Oral Daily   Or  . aspirin  324 mg Per Tube Daily  . atorvastatin  80 mg Oral  q1800  . bisacodyl  10 mg Oral Daily   Or  . bisacodyl  10 mg Rectal Daily  . chlorhexidine gluconate (MEDLINE KIT)  15 mL Mouth Rinse BID  . docusate sodium  200 mg Oral Daily  . insulin regular  0-10 Units Intravenous TID WC  . levothyroxine  75 mcg Oral QAC breakfast  . mouth rinse  15 mL Mouth Rinse QID  . metoprolol tartrate  12.5 mg Oral BID   Or  . metoprolol tartrate  12.5 mg Per Tube BID  . [START ON 08/30/2016] pantoprazole  40 mg Oral Daily  . sodium chloride flush  3 mL Intravenous Q12H   Continuous Infusions: . sodium chloride 20 mL/hr at 08/29/16 0400  . sodium chloride 250 mL (08/29/16 0459)  . sodium chloride 10 mL/hr at 08/29/16 0300  . amiodarone 30 mg/hr (08/29/16 0700)  . cefUROXime (ZINACEF)  IV Stopped (08/29/16 0415)  . dexmedetomidine (PRECEDEX) IV infusion Stopped (08/28/16 2035)  . insulin (NOVOLIN-R) infusion 1.2 Units/hr (08/29/16 0600)  . lactated ringers    . lactated ringers 20 mL/hr at 08/29/16 0400  . lactated ringers 20 mL/hr at 08/29/16 0300  . nitroGLYCERIN Stopped (08/28/16 1345)  . phenylephrine (NEO-SYNEPHRINE) Adult infusion Stopped (08/28/16 2311)   PRN Meds:.sodium chloride,  lactated ringers, metoprolol, midazolam, morphine injection, ondansetron (ZOFRAN) IV, oxyCODONE, sodium chloride flush  General appearance: alert, cooperative and no distress Neurologic: intact Heart: regular rate and rhythm, S1, S2 normal, no murmur, click, rub or gallop Lungs: clear to auscultation bilaterally Abdomen: soft, non-tender; bowel sounds normal; no masses,  no organomegaly Extremities: extremities normal, atraumatic, no cyanosis or edema Wound: sternum stable  Lab Results: CBC: Recent Labs  08/28/16 2000 08/29/16 0344  WBC 9.5 10.7*  HGB 10.2* 10.3*  HCT 30.1* 30.6*  PLT 122* 129*   BMET:  Recent Labs  08/28/16 0417  08/28/16 1952 08/28/16 2000 08/29/16 0344  NA 138  < > 138  --  137  K 3.9  < > 4.4  --  4.1  CL 110  < > 105  --  109    CO2 22  --   --   --  19*  GLUCOSE 131*  < > 119*  --  114*  BUN 16  < > 14  --  13  CREATININE 1.52*  < > 1.40* 1.40* 1.46*  CALCIUM 8.7*  --   --   --  7.9*  < > = values in this interval not displayed.  CMET: Lab Results  Component Value Date   WBC 10.7 (H) 08/29/2016   HGB 10.3 (L) 08/29/2016   HCT 30.6 (L) 08/29/2016   PLT 129 (L) 08/29/2016   GLUCOSE 114 (H) 08/29/2016   CHOL 164 08/26/2016   TRIG 84 08/26/2016   HDL 44 08/26/2016   LDLCALC 103 (H) 08/26/2016   ALT 16 (L) 08/29/2016   AST 36 08/29/2016   NA 137 08/29/2016   K 4.1 08/29/2016   CL 109 08/29/2016   CREATININE 1.46 (H) 08/29/2016   BUN 13 08/29/2016   CO2 19 (L) 08/29/2016   TSH 1.868 08/29/2016   INR 1.49 08/28/2016   HGBA1C 7.7 (H) 08/28/2016      PT/INR:  Recent Labs  08/28/16 1400  LABPROT 18.2*  INR 1.49   Radiology: Dg Chest Port 1 View  Result Date: 08/29/2016 CLINICAL DATA:  Status post CABG procedure. EXAM: PORTABLE CHEST 1 VIEW COMPARISON:  08/28/2016 FINDINGS: The ET tube has been removed. There is a Swan-Ganz catheter which appears coiled in the pulmonary outflow tract. Mediastinal drain and left chest tubes are in place. Postoperative changes compatible with CABG procedure noted. There is a left pleural effusion with persistent decreased aeration to the left lung base. IMPRESSION: 1. Left chest tube in place without pneumothorax. 2. Left pleural effusion with diminished aeration to the left lung base. Electronically Signed   By: Kerby Moors M.D.   On: 08/29/2016 07:47   Dg Chest Port 1 View  Result Date: 08/28/2016 CLINICAL DATA:  Status post coronary bypass grafting EXAM: PORTABLE CHEST 1 VIEW COMPARISON:  08/25/2016 FINDINGS: Cardiac shadow is at the upper limits of normal in size. Postsurgical changes are seen. Swan-Ganz catheter is noted coiled in the pulmonary outflow tract. Endotracheal tube and nasogastric catheter are noted in satisfactory position. Left thoracostomy tube and  mediastinal drain are also noted. No pneumothorax is seen. Mild bibasilar atelectasis is noted. IMPRESSION: Tubes and lines as described above. Bibasilar atelectatic changes. Postsurgical change. Electronically Signed   By: Inez Catalina M.D.   On: 08/28/2016 15:04     Assessment/Plan: S/P Procedure(s) (LRB): CORONARY ARTERY BYPASS GRAFTING (CABG) x five, using left internal mammary artery and right leg greater saphenous vein harvested endoscopically - -LIMA to LAD, -SVG to  OM, -SVG to DIAGONAL, -SEQ SVG to PDA, PLVB  (N/A) TRANSESOPHAGEAL ECHOCARDIOGRAM (TEE) (N/A) Mobilize Diuresis Diabetes control d/c tubes/lines Continue foley due to strict I&O, patient in ICU and urinary output monitoring See progression orders Expected Acute  Blood - loss Anemia Renal function stable   [name] 08/29/2016 8:25 AM

## 2016-08-29 NOTE — Progress Notes (Signed)
NIF -30, VC 1.3L

## 2016-08-29 NOTE — Progress Notes (Signed)
T[facility] BRIEF SICU PROGRESS NOTE  1 Day Post-Op  S/P Procedure(s) (LRB): CORONARY ARTERY BYPASS GRAFTING (CABG) x five, using left internal mammary artery and right leg greater saphenous vein harvested endoscopically - -LIMA to LAD, -SVG to OM, -SVG to DIAGONAL, -SEQ SVG to PDA, PLVB  (N/A) TRANSESOPHAGEAL ECHOCARDIOGRAM (TEE) (N/A)   Stable day NSR w/ stable BP Breathing comfortably w/ O2 sats 95% on 4 L/min Excellent UOP Creatinine up slightly 1.6  Plan: Continue current plan  Rexene Alberts, MD 08/29/2016 5:51 PM

## 2016-08-29 NOTE — Progress Notes (Signed)
     SUBJECTIVE: feels ok this am. No chest pain or dyspnea  BP 118/67   Pulse 71   Temp 97.9 F (36.6 C)   Resp 16   Ht '5\' 9"'$  (1.753 m)   Wt 192 lb 10.9 oz (87.4 kg)   SpO2 93%   BMI 28.45 kg/m   Intake/Output Summary (Last 24 hours) at 08/29/16 0826 Last data filed at 08/29/16 0700  Gross per 24 hour  Intake          6657.58 ml  Output             3435 ml  Net          3222.58 ml    PHYSICAL EXAM General: Well developed, well nourished, in no acute distress. Alert and oriented x 3.  Psych:  Good affect, responds appropriately Neck: No JVD. No masses noted.  Lungs: Clear bilaterally with no wheezes or rhonci noted.  Heart: RRR with no murmurs noted. Abdomen: Bowel sounds are present. Soft, non-tender.  Extremities: No lower extremity edema.   LABS: Basic Metabolic Panel:  Recent Labs  08/28/16 0417  08/28/16 1952 08/28/16 2000 08/29/16 0344  NA 138  < > 138  --  137  K 3.9  < > 4.4  --  4.1  CL 110  < > 105  --  109  CO2 22  --   --   --  19*  GLUCOSE 131*  < > 119*  --  114*  BUN 16  < > 14  --  13  CREATININE 1.52*  < > 1.40* 1.40* 1.46*  CALCIUM 8.7*  --   --   --  7.9*  MG  --   --   --  2.7* 2.2  < > = values in this interval not displayed. CBC:  Recent Labs  08/28/16 2000 08/29/16 0344  WBC 9.5 10.7*  HGB 10.2* 10.3*  HCT 30.1* 30.6*  MCV 89.3 89.2  PLT 122* 129*   Current Meds: . acetaminophen  1,000 mg Oral Q6H   Or  . acetaminophen (TYLENOL) oral liquid 160 mg/5 mL  1,000 mg Per Tube Q6H  . aspirin EC  325 mg Oral Daily   Or  . aspirin  324 mg Per Tube Daily  . atorvastatin  80 mg Oral q1800  . bisacodyl  10 mg Oral Daily   Or  . bisacodyl  10 mg Rectal Daily  . chlorhexidine gluconate (MEDLINE KIT)  15 mL Mouth Rinse BID  . docusate sodium  200 mg Oral Daily  . insulin regular  0-10 Units Intravenous TID WC  . levothyroxine  75 mcg Oral QAC breakfast  . mouth rinse  15 mL Mouth Rinse QID  . metoprolol tartrate  12.5 mg Oral  BID   Or  . metoprolol tartrate  12.5 mg Per Tube BID  . [START ON 08/30/2016] pantoprazole  40 mg Oral Daily  . sodium chloride flush  3 mL Intravenous Q12H     ASSESSMENT AND PLAN:  1. CAD/NSTEMI: he is s/p CABG on 08/28/16. He is doing well this am.  BP is stable. He is in sinus. He is on ASA, beta blocker and statin. Since he presented as ACS, would consider addition of Plavix before discharge.   Lauree Chandler  5/2/20188:26 AM

## 2016-08-29 NOTE — Care Management Note (Signed)
Case Management Note Marvetta Gibbons RN, BSN Unit 2W-Case Manager-- 2 H coverage [phone]  Patient Details  Name: Victor Gould MRN: [id number] Date of Birth: [birth year]  Subjective/Objective:  Pt admitted with NSEMI- s/p CABG on 08/28/16.                   Action/Plan: PTA pt lived at home - pt was independent and active- CM to follow for d/c needs.   Expected Discharge Date:                  Expected Discharge Plan:     In-House Referral:     Discharge planning Services  CM Consult  Post Acute Care Choice:    Choice offered to:     DME Arranged:    DME Agency:     HH Arranged:    HH Agency:     Status of Service:  In process, will continue to follow  If discussed at Long Length of Stay Meetings, dates discussed:    Discharge Disposition:   Additional Comments:  Dawayne Patricia, RN 08/29/2016, 10:18 AM

## 2016-08-30 ENCOUNTER — Inpatient Hospital Stay (HOSPITAL_COMMUNITY): Payer: Medicare Other

## 2016-08-30 LAB — CBC
HCT: 31.6 % — ABNORMAL LOW (ref 39.0–52.0)
Hemoglobin: 10.4 g/dL — ABNORMAL LOW (ref 13.0–17.0)
MCH: 29.9 pg (ref 26.0–34.0)
MCHC: 32.9 g/dL (ref 30.0–36.0)
MCV: 90.8 fL (ref 78.0–100.0)
Platelets: 162 10*3/uL (ref 150–400)
RBC: 3.48 MIL/uL — ABNORMAL LOW (ref 4.22–5.81)
RDW: 14.6 % (ref 11.5–15.5)
WBC: 14.2 10*3/uL — ABNORMAL HIGH (ref 4.0–10.5)

## 2016-08-30 LAB — GLUCOSE, CAPILLARY
Glucose-Capillary: 158 mg/dL — ABNORMAL HIGH (ref 65–99)
Glucose-Capillary: 194 mg/dL — ABNORMAL HIGH (ref 65–99)
Glucose-Capillary: 195 mg/dL — ABNORMAL HIGH (ref 65–99)
Glucose-Capillary: 215 mg/dL — ABNORMAL HIGH (ref 65–99)
Glucose-Capillary: 149 mg/dL — ABNORMAL HIGH (ref 65–99)
Glucose-Capillary: 146 mg/dL — ABNORMAL HIGH (ref 65–99)

## 2016-08-30 LAB — BASIC METABOLIC PANEL
Anion gap: 11 (ref 5–15)
BUN: 21 mg/dL — ABNORMAL HIGH (ref 6–20)
CO2: 22 mmol/L (ref 22–32)
Calcium: 8.1 mg/dL — ABNORMAL LOW (ref 8.9–10.3)
Chloride: 102 mmol/L (ref 101–111)
Creatinine, Ser: 1.76 mg/dL — ABNORMAL HIGH (ref 0.61–1.24)
GFR calc Af Amer: 41 mL/min — ABNORMAL LOW (ref >60)
GFR calc non Af Amer: 36 mL/min — ABNORMAL LOW (ref >60)
Glucose, Bld: 188 mg/dL — ABNORMAL HIGH (ref 65–99)
Potassium: 3.8 mmol/L (ref 3.5–5.1)
Sodium: 135 mmol/L (ref 135–145)

## 2016-08-30 MED ORDER — AMIODARONE HCL 200 MG PO TABS: 200.0000 mg | ORAL_TABLET | Freq: Every day | ORAL | Status: DC

## 2016-08-30 MED ORDER — INSULIN ASPART 100 UNIT/ML ~~LOC~~ SOLN: 0.0000 [IU] | Freq: Three times a day (TID) | SUBCUTANEOUS | Status: DC

## 2016-08-30 MED ORDER — ATORVASTATIN CALCIUM 40 MG PO TABS
40.0000 mg | ORAL_TABLET | Freq: Every day | ORAL | Status: DC
  Administered 2016-08-30 – 2016-09-02 (×4): 40 mg via ORAL
  Filled 2016-08-30 (×4): qty 1

## 2016-08-30 MED ORDER — AMIODARONE HCL 200 MG PO TABS: 200.0000 mg | ORAL_TABLET | Freq: Two times a day (BID) | ORAL | Status: DC

## 2016-08-30 MED ORDER — INSULIN ASPART 100 UNIT/ML ~~LOC~~ SOLN
0.0000 [IU] | Freq: Three times a day (TID) | SUBCUTANEOUS | Status: DC
  Administered 2016-08-30: 2 [IU] via SUBCUTANEOUS
  Administered 2016-08-30: 8 [IU] via SUBCUTANEOUS
  Administered 2016-08-30: 2 [IU] via SUBCUTANEOUS

## 2016-08-30 MED ORDER — FUROSEMIDE 10 MG/ML IJ SOLN
40.0000 mg | Freq: Once | INTRAMUSCULAR | Status: AC
  Administered 2016-08-30: 40 mg via INTRAVENOUS
  Filled 2016-08-30: qty 4

## 2016-08-30 MED ORDER — AMIODARONE HCL 200 MG PO TABS
200.0000 mg | ORAL_TABLET | Freq: Two times a day (BID) | ORAL | Status: DC
  Administered 2016-08-30 – 2016-09-03 (×9): 200 mg via ORAL
  Filled 2016-08-30 (×9): qty 1

## 2016-08-30 NOTE — Progress Notes (Signed)
PT chest tube removed per order.   Victor Gould

## 2016-08-30 NOTE — Progress Notes (Signed)
      [address]       [place] [address]             [phone]      PM rounds  Resting comfortably  BP (!) 151/77   Pulse 72   Temp 98.1 F (36.7 C) (Oral)   Resp 20   Ht 5\' 9"  (1.753 m)   Wt 189 lb 9.5 oz (86 kg)   SpO2 95%   BMI 28.00 kg/m    Intake/Output Summary (Last 24 hours) at 08/30/16 1810 Last data filed at 08/30/16 1700  Gross per 24 hour  Intake            907.2 ml  Output             1630 ml  Net           -722.8 ml   CBG a little high this morning, better this afternoon  Remo Lipps C. Roxan Hockey, MD [facility] [phone]

## 2016-08-30 NOTE — Progress Notes (Signed)
  Amiodarone Drug - Drug Interaction Consult Note  Recommendations: -Reduce atorvastatin dose to 40mg  daily -Consider switching ondansetron to promethazine if continued doses required. QTc okay for now. -Monitor QTc  Amiodarone is metabolized by the cytochrome P450 system and therefore has the potential to cause many drug interactions. Amiodarone has an average plasma half-life of 50 days (range 20 to 100 days).   Thank You, Myer Peer Grayland Ormond), PharmD  PGY1 Pharmacy Resident Pager: [phone] 08/30/2016 9:56 AM

## 2016-08-30 NOTE — Progress Notes (Signed)
Patient ID: JOSHVA LABRECK, male   DOB: 11-Mar-1941, 76 y.o.   MRN: [id number] TCTS DAILY ICU PROGRESS NOTE                   [address]            [place] [address]          [phone]   2 Days Post-Op Procedure(s) (LRB): CORONARY ARTERY BYPASS GRAFTING (CABG) x five, using left internal mammary artery and right leg greater saphenous vein harvested endoscopically - -LIMA to LAD, -SVG to OM, -SVG to DIAGONAL, -SEQ SVG to PDA, PLVB  (N/A) TRANSESOPHAGEAL ECHOCARDIOGRAM (TEE) (N/A)  Total Length of Stay:  LOS: 4 days   Subjective: Up to chair , walked in hall short distance this am,   Objective: Vital signs in last 24 hours: Temp:  [97.6 F (36.4 C)-98.2 F (36.8 C)] 98.2 F (36.8 C) (05/03 0802) Pulse Rate:  [69-83] 70 (05/03 0700) Cardiac Rhythm: Normal sinus rhythm (05/03 0745) Resp:  [11-22] 15 (05/03 0700) BP: (114-149)/(56-74) 140/66 (05/03 0700) SpO2:  [89 %-96 %] 93 % (05/03 0700) Arterial Line BP: (151-152)/(59-64) 151/64 (05/02 1000) Weight:  [189 lb 9.5 oz (86 kg)] 189 lb 9.5 oz (86 kg) (05/03 0500)  Filed Weights   08/28/16 0500 08/29/16 0144 08/30/16 0500  Weight: 177 lb 14.4 oz (80.7 kg) 192 lb 10.9 oz (87.4 kg) 189 lb 9.5 oz (86 kg)    Weight change: -3 lb 1.4 oz (-1.4 kg)   Hemodynamic parameters for last 24 hours: PAP: (30-31)/(5-10) 30/5  Intake/Output from previous day: 05/02 0701 - 05/03 0700 In: 1368.4 [P.O.:600; I.V.:668.4; IV Piggyback:100] Out: 3818 [EXHBZ:1696; Chest Tube:170]  Intake/Output this shift: No intake/output data recorded.  Current Meds: Scheduled Meds: . acetaminophen  1,000 mg Oral Q6H   Or  . acetaminophen (TYLENOL) oral liquid 160 mg/5 mL  1,000 mg Per Tube Q6H  . aspirin EC  325 mg Oral Daily   Or  . aspirin  324 mg Per Tube Daily  . atorvastatin  80 mg Oral q1800  . bisacodyl  10 mg Oral Daily   Or  . bisacodyl  10 mg Rectal Daily  . docusate sodium  200 mg Oral Daily  . enoxaparin (LOVENOX)  injection  30 mg Subcutaneous QHS  . insulin aspart  0-24 Units Subcutaneous Q4H  . insulin detemir  20 Units Subcutaneous Daily  . levothyroxine  75 mcg Oral QAC breakfast  . mouth rinse  15 mL Mouth Rinse BID  . metoprolol tartrate  12.5 mg Oral BID   Or  . metoprolol tartrate  12.5 mg Per Tube BID  . pantoprazole  40 mg Oral Daily  . sodium chloride flush  3 mL Intravenous Q12H   Continuous Infusions: . sodium chloride Stopped (08/29/16 1000)  . sodium chloride 250 mL (08/29/16 0459)  . sodium chloride 10 mL/hr at 08/29/16 0300  . amiodarone 30 mg/hr (08/30/16 0700)  . lactated ringers    . lactated ringers 10 mL/hr at 08/30/16 0700  . lactated ringers 20 mL/hr at 08/29/16 0300  . nitroGLYCERIN Stopped (08/28/16 1345)  . phenylephrine (NEO-SYNEPHRINE) Adult infusion Stopped (08/28/16 2311)   PRN Meds:.sodium chloride, lactated ringers, metoprolol, morphine injection, ondansetron (ZOFRAN) IV, oxyCODONE, sodium chloride flush  General appearance: alert and cooperative Neurologic: intact Heart: regular rate and rhythm, S1, S2 normal, no murmur, click, rub or gallop Lungs: diminished breath sounds bibasilar Abdomen: soft, non-tender; bowel sounds normal; no  masses,  no organomegaly Extremities: extremities normal, atraumatic, no cyanosis or edema and Homans sign is negative, no sign of DVT Wound: sternum stable  Lab Results: CBC: Recent Labs  08/29/16 1610 08/29/16 1624 08/30/16 0443  WBC 13.8*  --  14.2*  HGB 10.4* 10.5* 10.4*  HCT 31.5* 31.0* 31.6*  PLT 150  --  162   BMET:  Recent Labs  08/29/16 0344  08/29/16 1624 08/30/16 0443  NA 137  --  137 135  K 4.1  --  4.0 3.8  CL 109  --  103 102  CO2 19*  --   --  22  GLUCOSE 114*  --  162* 188*  BUN 13  --  17 21*  CREATININE 1.46*  < > 1.60* 1.76*  CALCIUM 7.9*  --   --  8.1*  < > = values in this interval not displayed.  CMET: Lab Results  Component Value Date   WBC 14.2 (H) 08/30/2016   HGB 10.4 (L)  08/30/2016   HCT 31.6 (L) 08/30/2016   PLT 162 08/30/2016   GLUCOSE 188 (H) 08/30/2016   CHOL 164 08/26/2016   TRIG 84 08/26/2016   HDL 44 08/26/2016   LDLCALC 103 (H) 08/26/2016   ALT 16 (L) 08/29/2016   AST 36 08/29/2016   NA 135 08/30/2016   K 3.8 08/30/2016   CL 102 08/30/2016   CREATININE 1.76 (H) 08/30/2016   BUN 21 (H) 08/30/2016   CO2 22 08/30/2016   TSH 1.868 08/29/2016   INR 1.49 08/28/2016   HGBA1C 7.7 (H) 08/28/2016      PT/INR:  Recent Labs  08/28/16 1400  LABPROT 18.2*  INR 1.49   Radiology: Dg Chest Port 1 View  Result Date: 08/30/2016 CLINICAL DATA:  Followup patient with the chest tube. Status post cardiac surgery. EXAM: PORTABLE CHEST 1 VIEW COMPARISON:  08/29/2016 FINDINGS: Since the previous exam, the the right Swan-Ganz catheter has been removed. The introducer sheath remains in place. The mediastinal tube has been removed. The left chest tube is stable, tip projecting at the left apex. No pneumothorax. There is persistent lung base opacity, greater on the left, consistent with combination of pleural fluid and atelectasis. No pulmonary edema. No mediastinal widening. IMPRESSION: 1. Persistent lung base atelectasis and pleural effusions, greater on the left. 2. No evidence of an operative complication. No pulmonary edema, pneumothorax or mediastinal widening. 3. Remaining support apparatus is well positioned. 4. No pneumothorax. Electronically Signed   By: Lajean Manes M.D.   On: 08/30/2016 07:57     Assessment/Plan: S/P Procedure(s) (LRB): CORONARY ARTERY BYPASS GRAFTING (CABG) x five, using left internal mammary artery and right leg greater saphenous vein harvested endoscopically - -LIMA to LAD, -SVG to OM, -SVG to DIAGONAL, -SEQ SVG to PDA, PLVB  (N/A) TRANSESOPHAGEAL ECHOCARDIOGRAM (TEE) (N/A) Mobilize Diuresis Diabetes control d/c tubes/lines Work on pulmonary toilet Expected Acute  Blood - loss Anemia    [name] 08/30/2016 8:08  AM

## 2016-08-31 ENCOUNTER — Inpatient Hospital Stay (HOSPITAL_COMMUNITY): Payer: Medicare Other

## 2016-08-31 LAB — CBC
HCT: 33.3 % — ABNORMAL LOW (ref 39.0–52.0)
Hemoglobin: 11.1 g/dL — ABNORMAL LOW (ref 13.0–17.0)
MCH: 30.1 pg (ref 26.0–34.0)
MCHC: 33.3 g/dL (ref 30.0–36.0)
MCV: 90.2 fL (ref 78.0–100.0)
Platelets: 187 10*3/uL (ref 150–400)
RBC: 3.69 MIL/uL — ABNORMAL LOW (ref 4.22–5.81)
RDW: 14.3 % (ref 11.5–15.5)
WBC: 11.6 10*3/uL — ABNORMAL HIGH (ref 4.0–10.5)

## 2016-08-31 LAB — BASIC METABOLIC PANEL
Anion gap: 9 (ref 5–15)
BUN: 23 mg/dL — ABNORMAL HIGH (ref 6–20)
CO2: 22 mmol/L (ref 22–32)
Calcium: 8 mg/dL — ABNORMAL LOW (ref 8.9–10.3)
Chloride: 103 mmol/L (ref 101–111)
Creatinine, Ser: 1.68 mg/dL — ABNORMAL HIGH (ref 0.61–1.24)
GFR calc Af Amer: 44 mL/min — ABNORMAL LOW (ref >60)
GFR calc non Af Amer: 38 mL/min — ABNORMAL LOW (ref >60)
Glucose, Bld: 116 mg/dL — ABNORMAL HIGH (ref 65–99)
Potassium: 3.4 mmol/L — ABNORMAL LOW (ref 3.5–5.1)
Sodium: 134 mmol/L — ABNORMAL LOW (ref 135–145)

## 2016-08-31 LAB — GLUCOSE, CAPILLARY
Glucose-Capillary: 118 mg/dL — ABNORMAL HIGH (ref 65–99)
Glucose-Capillary: 169 mg/dL — ABNORMAL HIGH (ref 65–99)
Glucose-Capillary: 215 mg/dL — ABNORMAL HIGH (ref 65–99)
Glucose-Capillary: 180 mg/dL — ABNORMAL HIGH (ref 65–99)

## 2016-08-31 MED ORDER — PANTOPRAZOLE SODIUM 40 MG PO TBEC
40.0000 mg | DELAYED_RELEASE_TABLET | Freq: Every day | ORAL | Status: DC
  Administered 2016-08-31 – 2016-09-03 (×4): 40 mg via ORAL
  Filled 2016-08-31 (×4): qty 1

## 2016-08-31 MED ORDER — POTASSIUM CHLORIDE 10 MEQ/50ML IV SOLN
10.0000 meq | INTRAVENOUS | Status: AC
  Administered 2016-08-31 (×3): 10 meq via INTRAVENOUS
  Filled 2016-08-31 (×3): qty 50

## 2016-08-31 MED ORDER — METOPROLOL TARTRATE 12.5 MG HALF TABLET
12.5000 mg | ORAL_TABLET | Freq: Two times a day (BID) | ORAL | Status: DC
  Administered 2016-08-31 – 2016-09-01 (×4): 12.5 mg via ORAL
  Filled 2016-08-31 (×5): qty 1

## 2016-08-31 MED ORDER — OXYCODONE HCL 5 MG PO TABS: 5.0000 mg | ORAL_TABLET | ORAL | Status: DC | PRN

## 2016-08-31 MED ORDER — ACETAMINOPHEN 325 MG PO TABS
650.0000 mg | ORAL_TABLET | Freq: Four times a day (QID) | ORAL | Status: DC | PRN
  Administered 2016-08-31 – 2016-09-03 (×5): 650 mg via ORAL
  Filled 2016-08-31 (×5): qty 2

## 2016-08-31 MED ORDER — MOVING RIGHT ALONG BOOK
Freq: Once | Status: AC
  Administered 2016-09-01: 12:00:00
  Filled 2016-08-31: qty 1

## 2016-08-31 MED ORDER — BISACODYL 10 MG RE SUPP: 10.0000 mg | Freq: Every day | RECTAL | Status: DC | PRN

## 2016-08-31 MED ORDER — SODIUM CHLORIDE 0.9 % IV SOLN: 250.0000 mL | INTRAVENOUS | Status: DC | PRN

## 2016-08-31 MED ORDER — ASPIRIN EC 325 MG PO TBEC
325.0000 mg | DELAYED_RELEASE_TABLET | Freq: Every day | ORAL | Status: DC
  Administered 2016-08-31 – 2016-09-03 (×4): 325 mg via ORAL
  Filled 2016-08-31 (×4): qty 1

## 2016-08-31 MED ORDER — ONDANSETRON HCL 4 MG/2ML IJ SOLN: 4.0000 mg | Freq: Four times a day (QID) | INTRAMUSCULAR | Status: DC | PRN

## 2016-08-31 MED ORDER — DOCUSATE SODIUM 100 MG PO CAPS
200.0000 mg | ORAL_CAPSULE | Freq: Every day | ORAL | Status: DC
  Administered 2016-08-31: 200 mg via ORAL
  Filled 2016-08-31: qty 2

## 2016-08-31 MED ORDER — SODIUM CHLORIDE 0.9% FLUSH
3.0000 mL | Freq: Two times a day (BID) | INTRAVENOUS | Status: DC
  Administered 2016-08-31 – 2016-09-02 (×5): 3 mL via INTRAVENOUS

## 2016-08-31 MED ORDER — FUROSEMIDE 40 MG PO TABS
40.0000 mg | ORAL_TABLET | Freq: Every day | ORAL | Status: DC
  Administered 2016-08-31 – 2016-09-01 (×2): 40 mg via ORAL
  Filled 2016-08-31 (×2): qty 1

## 2016-08-31 MED ORDER — INSULIN ASPART 100 UNIT/ML ~~LOC~~ SOLN
0.0000 [IU] | Freq: Three times a day (TID) | SUBCUTANEOUS | Status: DC
  Administered 2016-08-31: 4 [IU] via SUBCUTANEOUS
  Administered 2016-08-31: 8 [IU] via SUBCUTANEOUS
  Administered 2016-08-31 – 2016-09-01 (×4): 4 [IU] via SUBCUTANEOUS
  Administered 2016-09-01: 2 [IU] via SUBCUTANEOUS
  Administered 2016-09-02: 4 [IU] via SUBCUTANEOUS
  Administered 2016-09-02: 2 [IU] via SUBCUTANEOUS
  Administered 2016-09-02: 12 [IU] via SUBCUTANEOUS
  Administered 2016-09-02 – 2016-09-03 (×2): 4 [IU] via SUBCUTANEOUS

## 2016-08-31 MED ORDER — GLUCERNA SHAKE PO LIQD
237.0000 mL | Freq: Three times a day (TID) | ORAL | Status: DC
  Administered 2016-08-31 – 2016-09-02 (×3): 237 mL via ORAL

## 2016-08-31 MED ORDER — POTASSIUM CHLORIDE 20 MEQ PO PACK
20.0000 meq | PACK | Freq: Every day | ORAL | Status: DC
  Filled 2016-08-31: qty 1

## 2016-08-31 MED ORDER — POTASSIUM CHLORIDE CRYS ER 20 MEQ PO TBCR
20.0000 meq | EXTENDED_RELEASE_TABLET | Freq: Every day | ORAL | Status: AC
  Administered 2016-08-31 – 2016-09-02 (×3): 20 meq via ORAL
  Filled 2016-08-31 (×3): qty 1

## 2016-08-31 MED ORDER — BISACODYL 5 MG PO TBEC
10.0000 mg | DELAYED_RELEASE_TABLET | Freq: Every day | ORAL | Status: DC | PRN
  Administered 2016-08-31: 10 mg via ORAL
  Filled 2016-08-31: qty 2

## 2016-08-31 MED ORDER — ONDANSETRON HCL 4 MG PO TABS: 4.0000 mg | ORAL_TABLET | Freq: Four times a day (QID) | ORAL | Status: DC | PRN

## 2016-08-31 MED ORDER — SODIUM CHLORIDE 0.9% FLUSH: 3.0000 mL | INTRAVENOUS | Status: DC | PRN

## 2016-08-31 NOTE — Discharge Summary (Signed)
Physician Discharge Summary       [address]       [place] [address]             [phone]    Patient ID: Victor Gould MRN: [id number] DOB/AGE: Dec 12, 1940 76 y.o.  Admit date: 08/25/2016 Discharge date: 09/03/2016  Admission Diagnoses: 1. Unstable angina 2. NSTEMI (non-ST elevated myocardial infarction) ([place]) 3. Coronary artery disease  Active Diagnoses:  1. Hypertension 2. Hyperlipidemia 3. CKD (stage III)-baseline around 1.5 4. Diabetes mellitus 5.GERD (gastroesophageal reflux disease) 6. Thyroid disease 7. Prostate cancer ([place]) 8. A fib (converted to sinus rhythm) 9. ABL anemia  Procedure (s):  Left Heart Cath and Coronary Angiography by Dr. Tamala Julian on 08/27/2016:  Conclusion    Severe three-vessel coronary disease with total occlusion of the proximal LAD after the first diagonal, high-grade obstruction with superimposed thrombus in the large first diagonal which collateralizes the distal LAD, 80-90% obstruction in a small to moderate ramus intermedius, 70% stenosis and small first obtuse marginal, greater than 90% obstruction in the ostium to proximal PDA and 70% obstruction in the ongoing right coronary before a large left ventricular branch.  Preserved left ventricular systolic function, EF 62% with normal EDP of 16 mmHg   RECOMMENDATIONS:   TCT S evaluation for coronary revascularization via surgery    Coronary artery bypass grafting x5 with the left internal mammary to the left anterior descending coronary artery, reverse saphenous vein graft to the diagonal coronary artery, reverse saphenous vein graft to the obtuse marginal coronary artery, sequential reverse saphenous vein graft to the posterior descending and posterior branches of the right coronary artery with right leg greater saphenous endo vein harvesting, thigh and calf by Dr. Servando Snare on 08/28/2016.  History of Presenting Illness: This is a 76 yo white male with known  medical history of insulin dependent DM, CKD Stage 3, Hyperlipidemia, GERD, and H/O prostate cancer S/P Radical Prostatectomy ( [place]) .  He presented to the ED via EMS on 08/25/2016 brought from [facility]. He wanted to go to [place] but was told he had to go to [facility].  The patient developed complaints of chest pain while sitting at his computer.  He described the pain as being squeezing and constant with radiation into his left arm.  He also had mild associated shortness of breath and lightheadedness.  He denied N/V, palpitations and syncope.  He took ASA prior to calling EMS.  Workup in ED ruled patient in for NSTEMI and he was admitted for further care.  His Troponin level continued to trend upwards.  He was treated with Heparin and fluids.  He remained chest pain free prior to his cardiac catheterization.  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  It is felt coronary bypass grafting would be indicated and TCTS consult was obtained.  Currently the patient is chest pain free. He denies previous and family history of CAD.  He states his blood sugars are well controlled and run between 150-175.  He is physically active and does not usually experience symptoms.  He denies nicotine use in the past and currently.  Last dental visit was a month ago. Dr. Servando Snare discussed the need for coronary artery bypass grafting surgery. Potential risks, benefits, and complications were discussed with the patient and he agreed to proceed with surgery. Marland Kitchen He underwent a CABG x 5 on 08/28/2016.  Brief Hospital Course:  The patient was extubated the evening of  surgery without difficulty. He remained afebrile and hemodynamically stable. Victor Gould, a line, chest tubes, and foley were removed early in the post operative course. Lopressor was started and titrated accordingly. He did have brief a fib and was put on Amiodarone. He converted and remained in sinus rhythm. He was volume over  loaded and diuresed with careful monitoring of creatinine (has CKD stage III).  He had ABL anemia. He did not require a post op transfusion. Last H and H was 10 and 30.7. He was weaned off the insulin drip.  Once he was tolerating a diet, scheduled Insulin was restarted. The patient's glucose remained well controlled.The patient's HGA1C pre op was  7.7. He will need to continue follow up with his medical doctor. The patient was felt surgically stable for transfer from the ICU to PCTU for further convalescence on 08/31/2016. He continues to progress with cardiac rehab. He required several liters of oxygen via [place]. We were able to wean him to room air. He has been tolerating a diet and has had a bowel movement. He was restarted on low dose Losartan for better BP control on 09/03/2016.Epicardial pacing wires were removed on 09/03/2016. Chest tube sutures will be removed today. As discussed with Dr. Servando Snare, he patient is felt surgically stable for discharge today.    Latest Vital Signs: Blood pressure 135/62, pulse 80, temperature 98.1 F (36.7 C), temperature source Oral, resp. rate 18, height 5\' 9"  (1.753 m), weight 79 kg (174 lb 1.6 oz), SpO2 94 %.  Physical Exam: General appearance: alert and cooperative Neurologic: intact Heart: regular rate and rhythm, S1, S2 normal, no murmur, click, rub or gallop Lungs: diminished breath sounds bibasilar Abdomen: soft, non-tender; bowel sounds normal; no masses,  no organomegaly Extremities: extremities normal, atraumatic, no cyanosis or edema and Homans sign is negative, no sign of DVT Wound: sternum stable   Discharge Condition:Stable and discharged to home.  Recent laboratory studies:  Lab Results  Component Value Date   WBC 8.9 09/01/2016   HGB 10.0 (L) 09/01/2016   HCT 30.7 (L) 09/01/2016   MCV 91.1 09/01/2016   PLT 198 09/01/2016   Lab Results  Component Value Date   NA 138 09/01/2016   K 3.3 (L) 09/01/2016   CL 102 09/01/2016   CO2 23  09/01/2016   CREATININE 1.70 (H) 09/01/2016   GLUCOSE 160 (H) 09/01/2016    Diagnostic Studies: Dg Chest Port 1 View  Result Date: 08/31/2016 CLINICAL DATA:  76 year old male status post CABG on 08/28/2016. Chest tube removal. Shortness of breath. EXAM: PORTABLE CHEST 1 VIEW COMPARISON:  08/30/2016 and earlier. FINDINGS: Seated upright portable AP view of the chest at 0558 hours. Left chest tube removed. No pneumothorax. Stable right IJ introducer sheath. Stable lung volumes. Confluent left lung base opacity. Mild right lung base opacity. No pulmonary edema. Stable cardiac size and mediastinal contours. Sequelae of CABG. IMPRESSION: 1. Left chest tube removed with no pneumothorax. 2. Small left pleural effusion and bibasilar atelectasis. Electronically Signed   By: Genevie Ann M.D.   On: 08/31/2016 07:48   Discharge Instructions    Amb Referral to Cardiac Rehabilitation    Complete by:  As directed    Diagnosis:  CABG   CABG X ___:  5     Discharge Medications: Allergies as of 09/03/2016      Reactions   Naproxen Sodium Shortness Of Breath   Tramadol Other (See Comments)   dizziness   Sulfa Antibiotics Rash  Medication List    STOP taking these medications   amLODipine 5 MG tablet Commonly known as:  NORVASC     TAKE these medications   acetaminophen 500 MG tablet Commonly known as:  TYLENOL Take 1,000 mg by mouth daily as needed for headache (pain).   amiodarone 200 MG tablet Commonly known as:  PACERONE Take 1 tablet (200 mg total) by mouth every 12 (twelve) hours. For 2 days then take Amiodarone 200 mg by mouth daily thereafter.   aspirin 325 MG EC tablet Take 1 tablet (325 mg total) by mouth daily. What changed:  medication strength  how much to take  when to take this   atorvastatin 40 MG tablet Commonly known as:  LIPITOR Take 1 tablet (40 mg total) by mouth daily at 6 PM.   b complex vitamins tablet Take 1 tablet by mouth daily after breakfast.     cholecalciferol 1000 units tablet Commonly known as:  VITAMIN D Take 1,000 Units by mouth daily after breakfast.   HUMALOG KWIKPEN 100 UNIT/ML KiwkPen Generic drug:  insulin lispro Inject 7-10 Units into the skin See admin instructions. Inject 7 units subcutaneously before breakfast, lunch and dinner - plus adjustment for CBG 140-180 add 1 unit, 180-220 add 3 units, >220 add 3 units   hydrocortisone cream 1 % Apply 1 application topically daily as needed for itching.   insulin glargine 100 unit/mL Sopn Commonly known as:  LANTUS Inject 17 Units into the skin daily before breakfast.   levothyroxine 75 MCG tablet Commonly known as:  SYNTHROID, LEVOTHROID Take 75 mcg by mouth daily before breakfast.   losartan 25 MG tablet Commonly known as:  COZAAR Take 1 tablet (25 mg total) by mouth daily after breakfast. What changed:  medication strength  how much to take   metoprolol tartrate 25 MG tablet Commonly known as:  LOPRESSOR Take 1 tablet (25 mg total) by mouth 2 (two) times daily.   multivitamin with minerals Tabs tablet Take 1 tablet by mouth daily after breakfast.   omeprazole 20 MG capsule Commonly known as:  PRILOSEC Take 20 mg by mouth daily before breakfast.   OVER THE COUNTER MEDICATION Place 1 drop into both eyes daily as needed (dry eyes). Over the counter lubricating eye drops - must be preservative free   oxyCODONE 5 MG immediate release tablet Commonly known as:  Oxy IR/ROXICODONE Take one table by mouth every 4-6 hours PRN severe pain.      The patient has been discharged on:   1.Beta Blocker:  Yes [  x ]                              No   [   ]                              If No, reason:  2.Ace Inhibitor/ARB: Yes [x   ]                                     No  [    ]                                     If No, reason:  3.Statin:   Yes [  x ]                  No  [   ]                  If No, reason:  4.Ecasa:  Yes  [  x ]                  No    [   ]                  If No, reason:  Follow Up Appointments: Follow-up Information    [name], MD Follow up on 10/04/2016.   Specialty:  Cardiothoracic Surgery Why:  PA/LAT CXR to be taken (at [facility] which is in the same buillding as Dr. Everrett Coombe office) on 10/11/2016 at 3:30 pm: Appointment time is at 4:00 pm Contact information: [place] [address] [phone]        Rusty Aus, MD. Schedule an appointment as soon as possible for a visit.   Specialty:  Internal Medicine Why:  Call for an appointment regarding further diabetes management and surveillance of HGA1C 7.7 Contact information: [place] [address] [phone]        Imogene Burn, PA-C Follow up on 09/20/2016.   Specialty:  Cardiology Why:  Appointment time is at 12:45 pm Contact information: [address] [place] [address] [phone]           Signed: Lars Pinks MPA-C 09/03/2016, 8:11 AM

## 2016-08-31 NOTE — Discharge Instructions (Signed)
Coronary Artery Bypass Grafting, Care After °These instructions give you information on caring for yourself after your procedure. Your doctor may also give you more specific instructions. Call your doctor if you have any problems or questions after your procedure. °Follow these instructions at home: °· Only take medicine as told by your doctor. Take medicines exactly as told. Do not stop taking medicines or start any new medicines without talking to your doctor first. °· Take your pulse as told by your doctor. °· Do deep breathing as told by your doctor. Use your breathing device (incentive spirometer), if given, to practice deep breathing several times a day. Support your chest with a pillow or your arms when you take deep breaths or cough. °· Keep the area clean, dry, and protected where the surgery cuts (incisions) were made. Remove bandages (dressings) only as told by your doctor. If strips were applied to surgical area, do not take them off. They fall off on their own. °· Check the surgery area daily for puffiness (swelling), redness, or leaking fluid. °· If surgery cuts were made in your legs: °? Avoid crossing your legs. °? Avoid sitting for long periods of time. Change positions every 30 minutes. °? Raise your legs when you are sitting. Place them on pillows. °· Wear stockings that help keep blood clots from forming in your legs (compression stockings). °· Only take sponge baths until your doctor says it is okay to take showers. Pat the surgery area dry. Do not rub the surgery area with a washcloth or towel. Do not bathe, swim, or use a hot tub until your doctor says it is okay. °· Eat foods that are high in fiber. These include raw fruits and vegetables, whole grains, beans, and nuts. Choose lean meats. Avoid canned, processed, and fried foods. °· Drink enough fluids to keep your pee (urine) clear or pale yellow. °· Weigh yourself every day. °· Rest and limit activity as told by your doctor. You may be told  to: °? Stop any activity if you have chest pain, shortness of breath, changes in heartbeat, or dizziness. Get help right away if this happens. °? Move around often for short amounts of time or take short walks as told by your doctor. Gradually become more active. You may need help to strengthen your muscles and build endurance. °? Avoid lifting, pushing, or pulling anything heavier than 10 pounds (4.5 kg) for at least 6 weeks after surgery. °· Do not drive until your doctor says it is okay. °· Ask your doctor when you can go back to work. °· Ask your doctor when you can begin sexual activity again. °· Follow up with your doctor as told. °Contact a doctor if: °· You have puffiness, redness, more pain, or fluid draining from the incision site. °· You have a fever. °· You have puffiness in your ankles or legs. °· You have pain in your legs. °· You gain 2 or more pounds (0.9 kg) a day. °· You feel sick to your stomach (nauseous) or throw up (vomit). °· You have watery poop (diarrhea). °Get help right away if: °· You have chest pain that goes to your jaw or arms. °· You have shortness of breath. °· You have a fast or irregular heartbeat. °· You notice a "clicking" in your breastbone when you move. °· You have numbness or weakness in your arms or legs. °· You feel dizzy or light-headed. °This information is not intended to replace advice given to you by   your health care provider. Make sure you discuss any questions you have with your health care provider. °Document Released: 04/21/2013 Document Revised: 09/22/2015 Document Reviewed: 09/23/2012 °Elsevier Interactive Patient Education © 2017 [employer] ° °

## 2016-08-31 NOTE — Progress Notes (Signed)
Report given to [place], RN on [facility], pt receiving 3 runs of K+ currently and a/w introducer removal and one hours of bedrest before transfer can happen, will continue to monitor.   Sherrie Mustache 10:39 AM

## 2016-08-31 NOTE — Progress Notes (Signed)
Patient ID: JAKADEN OUZTS, male   DOB: [birth year], 76 y.o.   MRN: [id number] TCTS DAILY ICU PROGRESS NOTE                   [address]            [place] [address]          [phone]   3 Days Post-Op Procedure(s) (LRB): CORONARY ARTERY BYPASS GRAFTING (CABG) x five, using left internal mammary artery and right leg greater saphenous vein harvested endoscopically - -LIMA to LAD, -SVG to OM, -SVG to DIAGONAL, -SEQ SVG to PDA, PLVB  (N/A) TRANSESOPHAGEAL ECHOCARDIOGRAM (TEE) (N/A)  Total Length of Stay:  LOS: 5 days   Subjective: Not sleeping, alert neuro inatct, not eating much  Objective: Vital signs in last 24 hours: Temp:  [98.1 F (36.7 C)-98.8 F (37.1 C)] 98.3 F (36.8 C) (05/04 0400) Pulse Rate:  [68-84] 79 (05/04 0700) Cardiac Rhythm: Normal sinus rhythm (05/04 0400) Resp:  [12-24] 20 (05/04 0700) BP: (122-157)/(59-80) 133/72 (05/04 0700) SpO2:  [88 %-100 %] 95 % (05/04 0700) Weight:  [186 lb 4.6 oz (84.5 kg)] 186 lb 4.6 oz (84.5 kg) (05/04 0451)  Filed Weights   08/29/16 0144 08/30/16 0500 08/31/16 0451  Weight: 192 lb 10.9 oz (87.4 kg) 189 lb 9.5 oz (86 kg) 186 lb 4.6 oz (84.5 kg)    Weight change: -3 lb 4.9 oz (-1.5 kg)   Hemodynamic parameters for last 24 hours:    Intake/Output from previous day: 05/03 0701 - 05/04 0700 In: 1959.1 [P.O.:360; I.V.:1599.1] Out: 0623 [Urine:1255; Chest Tube:50]  Intake/Output this shift: No intake/output data recorded.  Current Meds: Scheduled Meds: . acetaminophen  1,000 mg Oral Q6H   Or  . acetaminophen (TYLENOL) oral liquid 160 mg/5 mL  1,000 mg Per Tube Q6H  . amiodarone  200 mg Oral Q12H   Followed by  . [START ON 09/06/2016] amiodarone  200 mg Oral Daily  . aspirin EC  325 mg Oral Daily   Or  . aspirin  324 mg Per Tube Daily  . atorvastatin  40 mg Oral q1800  . bisacodyl  10 mg Oral Daily   Or  . bisacodyl  10 mg Rectal Daily  . docusate sodium  200 mg Oral Daily  . enoxaparin  (LOVENOX) injection  30 mg Subcutaneous QHS  . insulin aspart  0-24 Units Subcutaneous TID AC & HS  . insulin detemir  20 Units Subcutaneous Daily  . levothyroxine  75 mcg Oral QAC breakfast  . mouth rinse  15 mL Mouth Rinse BID  . metoprolol tartrate  12.5 mg Oral BID   Or  . metoprolol tartrate  12.5 mg Per Tube BID  . pantoprazole  40 mg Oral Daily  . sodium chloride flush  3 mL Intravenous Q12H   Continuous Infusions: . sodium chloride Stopped (08/29/16 1000)  . sodium chloride Stopped (08/30/16 2200)  . sodium chloride Stopped (08/30/16 2200)  . lactated ringers    . lactated ringers 10 mL/hr at 08/31/16 0700  . lactated ringers Stopped (08/30/16 2200)   PRN Meds:.sodium chloride, lactated ringers, metoprolol, morphine injection, ondansetron (ZOFRAN) IV, oxyCODONE, sodium chloride flush  General appearance: alert and cooperative Neurologic: intact Heart: regular rate and rhythm, S1, S2 normal, no murmur, click, rub or gallop Lungs: diminished breath sounds bibasilar Abdomen: soft, non-tender; bowel sounds normal; no masses,  no organomegaly Extremities: extremities normal, atraumatic, no cyanosis or edema and Homans sign  is negative, no sign of DVT Wound: sternum stable   Lab Results: CBC: Recent Labs  08/29/16 1610 08/29/16 1624 08/30/16 0443  WBC 13.8*  --  14.2*  HGB 10.4* 10.5* 10.4*  HCT 31.5* 31.0* 31.6*  PLT 150  --  162   BMET:  Recent Labs  08/29/16 0344  08/29/16 1624 08/30/16 0443  NA 137  --  137 135  K 4.1  --  4.0 3.8  CL 109  --  103 102  CO2 19*  --   --  22  GLUCOSE 114*  --  162* 188*  BUN 13  --  17 21*  CREATININE 1.46*  < > 1.60* 1.76*  CALCIUM 7.9*  --   --  8.1*  < > = values in this interval not displayed.  CMET: Lab Results  Component Value Date   WBC 14.2 (H) 08/30/2016   HGB 10.4 (L) 08/30/2016   HCT 31.6 (L) 08/30/2016   PLT 162 08/30/2016   GLUCOSE 188 (H) 08/30/2016   CHOL 164 08/26/2016   TRIG 84 08/26/2016   HDL 44  08/26/2016   LDLCALC 103 (H) 08/26/2016   ALT 16 (L) 08/29/2016   AST 36 08/29/2016   NA 135 08/30/2016   K 3.8 08/30/2016   CL 102 08/30/2016   CREATININE 1.76 (H) 08/30/2016   BUN 21 (H) 08/30/2016   CO2 22 08/30/2016   TSH 1.868 08/29/2016   INR 1.49 08/28/2016   HGBA1C 7.7 (H) 08/28/2016      PT/INR:  Recent Labs  08/28/16 1400  LABPROT 18.2*  INR 1.49   Radiology: No results found.   Assessment/Plan: S/P Procedure(s) (LRB): CORONARY ARTERY BYPASS GRAFTING (CABG) x five, using left internal mammary artery and right leg greater saphenous vein harvested endoscopically - -LIMA to LAD, -SVG to OM, -SVG to DIAGONAL, -SEQ SVG to PDA, PLVB  (N/A) TRANSESOPHAGEAL ECHOCARDIOGRAM (TEE) (N/A) Mobilize Diuresis Diabetes control d/c tubes/lines Plan for transfer to step-down: see transfer orders Labs pending     [name] 08/31/2016 7:10 AM

## 2016-09-01 ENCOUNTER — Inpatient Hospital Stay (HOSPITAL_COMMUNITY): Payer: Medicare Other

## 2016-09-01 DIAGNOSIS — Z951 Presence of aortocoronary bypass graft: Secondary | ICD-10-CM

## 2016-09-01 LAB — BASIC METABOLIC PANEL
Anion gap: 13 (ref 5–15)
BUN: 21 mg/dL — ABNORMAL HIGH (ref 6–20)
CO2: 23 mmol/L (ref 22–32)
Calcium: 8 mg/dL — ABNORMAL LOW (ref 8.9–10.3)
Chloride: 102 mmol/L (ref 101–111)
Creatinine, Ser: 1.7 mg/dL — ABNORMAL HIGH (ref 0.61–1.24)
GFR calc Af Amer: 43 mL/min — ABNORMAL LOW (ref >60)
GFR calc non Af Amer: 37 mL/min — ABNORMAL LOW (ref >60)
Glucose, Bld: 160 mg/dL — ABNORMAL HIGH (ref 65–99)
Potassium: 3.3 mmol/L — ABNORMAL LOW (ref 3.5–5.1)
Sodium: 138 mmol/L (ref 135–145)

## 2016-09-01 LAB — CBC
HCT: 30.7 % — ABNORMAL LOW (ref 39.0–52.0)
Hemoglobin: 10 g/dL — ABNORMAL LOW (ref 13.0–17.0)
MCH: 29.7 pg (ref 26.0–34.0)
MCHC: 32.6 g/dL (ref 30.0–36.0)
MCV: 91.1 fL (ref 78.0–100.0)
Platelets: 198 10*3/uL (ref 150–400)
RBC: 3.37 MIL/uL — ABNORMAL LOW (ref 4.22–5.81)
RDW: 14.1 % (ref 11.5–15.5)
WBC: 8.9 10*3/uL (ref 4.0–10.5)

## 2016-09-01 LAB — GLUCOSE, CAPILLARY
Glucose-Capillary: 138 mg/dL — ABNORMAL HIGH (ref 65–99)
Glucose-Capillary: 162 mg/dL — ABNORMAL HIGH (ref 65–99)
Glucose-Capillary: 197 mg/dL — ABNORMAL HIGH (ref 65–99)
Glucose-Capillary: 168 mg/dL — ABNORMAL HIGH (ref 65–99)

## 2016-09-01 MED ORDER — POTASSIUM CHLORIDE CRYS ER 20 MEQ PO TBCR
40.0000 meq | EXTENDED_RELEASE_TABLET | Freq: Once | ORAL | Status: AC
  Administered 2016-09-01: 40 meq via ORAL
  Filled 2016-09-01: qty 2

## 2016-09-01 MED ORDER — FUROSEMIDE 10 MG/ML IJ SOLN
40.0000 mg | Freq: Every day | INTRAMUSCULAR | Status: DC
  Administered 2016-09-01 – 2016-09-02 (×2): 40 mg via INTRAVENOUS
  Filled 2016-09-01 (×2): qty 4

## 2016-09-01 NOTE — Progress Notes (Signed)
Patient ambulated 371ft with this RN using a front wheel walker on 3L of oxygen, ambulation well tolerated will continue to monitor.

## 2016-09-01 NOTE — Progress Notes (Addendum)
[address]       [place] [address]             [phone]      4 Days Post-Op Procedure(s) (LRB): CORONARY ARTERY BYPASS GRAFTING (CABG) x five, using left internal mammary artery and right leg greater saphenous vein harvested endoscopically - -LIMA to LAD, -SVG to OM, -SVG to DIAGONAL, -SEQ SVG to PDA, PLVB  (N/A) TRANSESOPHAGEAL ECHOCARDIOGRAM (TEE) (N/A) Subjective: Requiring 5L [place] this morning. He shares that he is eating about half of each meal. He plans to work with cardiac rehab today.   Objective: Vital signs in last 24 hours: Temp:  [97.8 F (36.6 C)-98.5 F (36.9 C)] 98.5 F (36.9 C) (05/05 0511) Pulse Rate:  [72-91] 87 (05/05 0511) Cardiac Rhythm: Normal sinus rhythm (05/04 2143) Resp:  [11-20] 18 (05/05 0511) BP: (116-155)/(62-82) 137/67 (05/05 0511) SpO2:  [93 %-96 %] 93 % (05/05 0511) Weight:  [82 kg (180 lb 12.8 oz)] 82 kg (180 lb 12.8 oz) (05/05 0511)     Intake/Output from previous day: 05/04 0701 - 05/05 0700 In: 150 [IV Piggyback:150] Out: 452 [Urine:450; Stool:2] Intake/Output this shift: No intake/output data recorded.  General appearance: alert, cooperative and no distress Heart: regular rate and rhythm, S1, S2 normal, no murmur, click, rub or gallop Lungs: clear to auscultation bilaterally Abdomen: soft, non-tender; bowel sounds normal; no masses,  no organomegaly Extremities: extremities normal, atraumatic, no cyanosis or edema Wound: clean and dry  Lab Results:  Recent Labs  08/31/16 0704 09/01/16 0541  WBC 11.6* 8.9  HGB 11.1* 10.0*  HCT 33.3* 30.7*  PLT 187 198   BMET:  Recent Labs  08/31/16 0704 09/01/16 0541  NA 134* 138  K 3.4* 3.3*  CL 103 102  CO2 22 23  GLUCOSE 116* 160*  BUN 23* 21*  CREATININE 1.68* 1.70*  CALCIUM 8.0* 8.0*    PT/INR: No results for input(s): LABPROT, INR in the last 72 hours. ABG    Component Value Date/Time   PHART 7.391 08/29/2016 0505   HCO3 18.5 (L) 08/29/2016  0505   TCO2 23 08/29/2016 1624   ACIDBASEDEF 6.0 (H) 08/29/2016 0505   O2SAT 93.0 08/29/2016 0505   CBG (last 3)   Recent Labs  08/31/16 1717 08/31/16 2117 09/01/16 0601  GLUCAP 215* 180* 138*    Assessment/Plan: S/P Procedure(s) (LRB): CORONARY ARTERY BYPASS GRAFTING (CABG) x five, using left internal mammary artery and right leg greater saphenous vein harvested endoscopically - -LIMA to LAD, -SVG to OM, -SVG to DIAGONAL, -SEQ SVG to PDA, PLVB  (N/A) TRANSESOPHAGEAL ECHOCARDIOGRAM (TEE) (N/A)  1. CV-NSR in the 80s. BP well controlled. Continue Amio PO, Lopressor 12.5mg , and Lipitor 2. Pulm- CXR yesterday without pneumothorax, small left pleural effusion and bibasilar atelectasis. 5L [place] this morning with good saturation. Continue pulm toilet 3. Renal-creatinine stable. Give extra potassium today. Continue Lasix. Weight continues to trend down 4. Endo-blood glucose level with moderate control on current regimen. Continue Levothyroxine for thyroid function. 5. Lovenox for DVT proph  Plan: Glucerna for nutritional supplementation, ambulate TID, encourage incentive spirometry use hourly, continue to wean oxygen as tolerated. The patient is not a smoker and does not have any previous respiratory issues. He does not use oxygen at home.        LOS: 6 days    Elgie Collard 09/01/2016  still on o2 Home 2-3 more days  I have seen and examined [name] and  agree with the above assessment  and plan.  [name] MD [place] [phone] Office [phone] 09/01/2016 11:48 AM

## 2016-09-01 NOTE — Progress Notes (Signed)
CARDIAC REHAB PHASE I   PRE:  Rate/Rhythm: 79 sr  BP:  Sitting: 128/60     SaO2: 93% 3l  MODE:  Ambulation: 350 ft   POST:  Rate/Rhythm: 87 sr  BP:  Sitting: 122/70     SaO2: 97% 3l  10:20am-10:50am Patient ambulated with x2 assist and rolling walker. Walked slow and steady. No rest breaks needed. Constant pulse oximetry 94% during walk. Re-educated on how to get in and out of bed safely. Patient back to chair with call bell in reach. Wife at bedside.  [place], [place] 09/01/2016 10:44 AM

## 2016-09-02 LAB — GLUCOSE, CAPILLARY
Glucose-Capillary: 197 mg/dL — ABNORMAL HIGH (ref 65–99)
Glucose-Capillary: 268 mg/dL — ABNORMAL HIGH (ref 65–99)
Glucose-Capillary: 135 mg/dL — ABNORMAL HIGH (ref 65–99)
Glucose-Capillary: 168 mg/dL — ABNORMAL HIGH (ref 65–99)

## 2016-09-02 MED ORDER — METOPROLOL TARTRATE 25 MG PO TABS
25.0000 mg | ORAL_TABLET | Freq: Two times a day (BID) | ORAL | Status: DC
  Administered 2016-09-02 – 2016-09-03 (×3): 25 mg via ORAL
  Filled 2016-09-02 (×3): qty 1

## 2016-09-02 MED ORDER — DIPHENHYDRAMINE HCL 25 MG PO CAPS
25.0000 mg | ORAL_CAPSULE | Freq: Every evening | ORAL | Status: DC | PRN
  Administered 2016-09-02: 25 mg via ORAL
  Filled 2016-09-02: qty 1

## 2016-09-02 MED ORDER — DIPHENHYDRAMINE HCL 25 MG PO CAPS: 25.0000 mg | ORAL_CAPSULE | Freq: Every evening | ORAL | Status: DC | PRN

## 2016-09-02 NOTE — Progress Notes (Addendum)
      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 Appetite has improved  Objective: Vital signs in last 24 hours: Temp:  [98.2 F (36.8 C)-98.8 F (37.1 C)] 98.3 F (36.8 C) (05/06 0522) Pulse Rate:  [82-89] 87 (05/06 0522) Cardiac Rhythm: Normal sinus rhythm;Bundle branch block (05/06 0712) Resp:  [18] 18 (05/06 0522) BP: (128-150)/(55-66) 150/66 (05/06 0522) SpO2:  [90 %-94 %] 94 % (05/06 0522) Weight:  [80.2 kg (176 lb 14.4 oz)] 80.2 kg (176 lb 14.4 oz) (05/06 0519)     Intake/Output from previous day: 05/05 0701 - 05/06 0700 In: 960 [P.O.:960] Out: 920 [Urine:920] Intake/Output this shift: No intake/output data recorded.  General appearance: alert, cooperative and no distress Heart: regular rate and rhythm, S1, S2 normal, no murmur, click, rub or gallop Lungs: clear to auscultation bilaterally Abdomen: soft, non-tender; bowel sounds normal; no masses,  no organomegaly Extremities: extremities normal, atraumatic, no cyanosis or edema Wound: clean and dry  Lab Results:  Recent Labs  08/31/16 0704 09/01/16 0541  WBC 11.6* 8.9  HGB 11.1* 10.0*  HCT 33.3* 30.7*  PLT 187 198   BMET:  Recent Labs  08/31/16 0704 09/01/16 0541  NA 134* 138  K 3.4* 3.3*  CL 103 102  CO2 22 23  GLUCOSE 116* 160*  BUN 23* 21*  CREATININE 1.68* 1.70*  CALCIUM 8.0* 8.0*    PT/INR: No results for input(s): LABPROT, INR in the last 72 hours. 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 CV-NSR in the 80s. BP elevated this morning. Continue Amio PO, increase Lopressor to 25mg , and Lipitor. Discontinue EPW 2. Pulm- CXR yesterday without pneumothorax, small left pleural effusion and bibasilar atelectasis. Room air this morning with good saturation. Continue pulm toilet. With ambulation required 3 L.  3. Renal-creatinine stable. Continue Lasix one more day. Weight continues to trend down 4. Endo-blood glucose level with moderate control on current regimen. Continue Levothyroxine for thyroid function. 5. Lovenox for DVT proph 6. Insomnia- PRN Benadryl   Plan: Increase Lopressor, work on ambulation without oxygen, continue diuresis, plan for home tomorrow   LOS: 7 days    Elgie Collard 09/02/2016  reanl function stable If stable off o2 today plan d/c in am I have seen and examined Victor Gould and agree with the above assessment  and plan.  [name] MD Beeper [phone] Office [phone] 09/02/2016 10:52 AM

## 2016-09-02 NOTE — Progress Notes (Signed)
patient education completed for removal of pacer wires, pacer wires removed as ordered, patient now on bedrest call bell within reach will continue to monitor

## 2016-09-02 NOTE — Progress Notes (Signed)
Patient ambulated in the hall on room air using a front wheel walker, patient maintained an O2 saturation above 95 % during ambulation, will continue to monitor

## 2016-09-03 ENCOUNTER — Telehealth: Payer: Self-pay | Admitting: Cardiology

## 2016-09-03 LAB — GLUCOSE, CAPILLARY: Glucose-Capillary: 169 mg/dL — ABNORMAL HIGH (ref 65–99)

## 2016-09-03 MED ORDER — OXYCODONE HCL 5 MG PO TABS: ORAL_TABLET | ORAL | 0 refills | Status: DC

## 2016-09-03 MED ORDER — FUROSEMIDE 40 MG PO TABS
40.0000 mg | ORAL_TABLET | Freq: Every day | ORAL | Status: DC
  Administered 2016-09-03: 40 mg via ORAL
  Filled 2016-09-03: qty 1

## 2016-09-03 MED ORDER — METOPROLOL TARTRATE 25 MG PO TABS: 25.0000 mg | ORAL_TABLET | Freq: Two times a day (BID) | ORAL | 1 refills | Status: DC

## 2016-09-03 MED ORDER — ATORVASTATIN CALCIUM 40 MG PO TABS
40.0000 mg | ORAL_TABLET | Freq: Every day | ORAL | 1 refills | Status: DC
Start: 2016-09-03 — End: 2021-03-02

## 2016-09-03 MED ORDER — ASPIRIN 325 MG PO TBEC: 325.0000 mg | DELAYED_RELEASE_TABLET | Freq: Every day | ORAL | 0 refills | Status: DC

## 2016-09-03 MED ORDER — AMIODARONE HCL 200 MG PO TABS: 200.0000 mg | ORAL_TABLET | Freq: Two times a day (BID) | ORAL | 1 refills | Status: DC

## 2016-09-03 MED ORDER — LOSARTAN POTASSIUM 25 MG PO TABS
25.0000 mg | ORAL_TABLET | Freq: Every day | ORAL | Status: DC
  Administered 2016-09-03: 25 mg via ORAL
  Filled 2016-09-03: qty 1

## 2016-09-03 MED ORDER — AMLODIPINE BESYLATE 5 MG PO TABS: 5.0000 mg | ORAL_TABLET | Freq: Every day | ORAL | Status: DC

## 2016-09-03 MED ORDER — CLOPIDOGREL BISULFATE 75 MG PO TABS: 75.0000 mg | ORAL_TABLET | Freq: Every day | ORAL | Status: DC

## 2016-09-03 MED ORDER — POTASSIUM CHLORIDE CRYS ER 20 MEQ PO TBCR
20.0000 meq | EXTENDED_RELEASE_TABLET | Freq: Once | ORAL | Status: AC
  Administered 2016-09-03: 20 meq via ORAL
  Filled 2016-09-03: qty 1

## 2016-09-03 MED ORDER — LOSARTAN POTASSIUM 25 MG PO TABS
25.0000 mg | ORAL_TABLET | Freq: Every day | ORAL | 1 refills | Status: DC
Start: 2016-09-03 — End: 2019-05-28

## 2016-09-03 NOTE — Progress Notes (Signed)
Patient Name: Victor Gould Date of Encounter: 09/03/2016  Principal Problem:   NSTEMI (non-ST elevated myocardial infarction) [facility]) Active Problems:   S/P CABG x 5   Length of Stay: 8  SUBJECTIVE  The patient denies CP or SOB.  CURRENT MEDS . amiodarone  200 mg Oral Q12H   Followed by  . [START ON 09/06/2016] amiodarone  200 mg Oral Daily  . aspirin EC  325 mg Oral Daily  . atorvastatin  40 mg Oral q1800  . docusate sodium  200 mg Oral Daily  . enoxaparin (LOVENOX) injection  30 mg Subcutaneous QHS  . feeding supplement (GLUCERNA SHAKE)  237 mL Oral TID WC  . furosemide  40 mg Oral Daily  . insulin aspart  0-24 Units Subcutaneous TID AC & HS  . levothyroxine  75 mcg Oral QAC breakfast  . losartan  25 mg Oral Daily  . mouth rinse  15 mL Mouth Rinse BID  . metoprolol tartrate  25 mg Oral BID  . pantoprazole  40 mg Oral QAC breakfast  . potassium chloride  20 mEq Oral Once  . sodium chloride flush  3 mL Intravenous Q12H   OBJECTIVE  Vitals:   09/03/16 0917 09/03/16 0918 09/03/16 0921 09/03/16 0927  BP: 122/62 (!) 94/52 (!) 94/47 127/63  Pulse:      Resp:      Temp:      TempSrc:      SpO2:      Weight:      Height:        Intake/Output Summary (Last 24 hours) at 09/03/16 1013 Last data filed at 09/03/16 0730  Gross per 24 hour  Intake             1200 ml  Output              240 ml  Net              960 ml   Filed Weights   09/01/16 0511 09/02/16 0519 09/03/16 0425  Weight: 180 lb 12.8 oz (82 kg) 176 lb 14.4 oz (80.2 kg) 174 lb 1.6 oz (79 kg)   PHYSICAL EXAM  General: Pleasant, NAD. Neuro: Alert and oriented X 3. Moves all extremities spontaneously. Psych: Normal affect. HEENT:  Normal  Neck: Supple without bruits or JVD. Lungs:  Resp regular and unlabored, CTA. Healing sternotomy scar, no erythema or discharge. Heart: RRR no s3, s4, or murmurs. Abdomen: Soft, non-tender, non-distended, BS + x 4.  Extremities: No clubbing, cyanosis or edema.  DP/PT/Radials 2+ and equal bilaterally.  Accessory Clinical Findings  CBC  Recent Labs  09/01/16 0541  WBC 8.9  HGB 10.0*  HCT 30.7*  MCV 91.1  PLT 811   Basic Metabolic Panel  Recent Labs  09/01/16 0541  NA 138  K 3.3*  CL 102  CO2 23  GLUCOSE 160*  BUN 21*  CREATININE 1.70*  CALCIUM 8.0*   Radiology/Studies  Dg Chest 2 View  Result Date: 09/01/2016 CLINICAL DATA:  Status post CABG EXAM: CHEST  2 VIEW COMPARISON:  08/31/2016 chest radiograph. FINDINGS: Intact median sternotomy wires. Stable cardiomediastinal silhouette with normal heart size. No pneumothorax. Stable small left pleural effusion. Stable trace right pleural effusion. No pulmonary edema. Stable bibasilar atelectasis, left greater than right. IMPRESSION: 1. No pneumothorax. 2. Stable small left and trace right pleural effusions. 3. Stable left greater than right bibasilar atelectasis.   TELE: SR, personally reviewed   ASSESSMENT AND PLAN  1.  S/p NSTEMI.  - continue ASA, add Plavix, s/p CABG x 5 on 08/25/16 - continue atorvastatin, metoprolol, losartan  2. PAF - maintaining SR in the 80's this am . On Amiodarone 200 mg bid, Lopressor 25 mg bid. Scheduled to decrease to amiodarone 200 mg po daily on 5/10  3. Essential hypertension  - controlled  4. Hypokalemia - replace  5. Chronic diastolic CHF - LVEF 70-62%, continue lasix 40 mg po daily  Discharge today, we will arrange for an outpatient follow up with the next 2 weeks.   Signed, Ena Dawley MD, [facility] 09/03/2016

## 2016-09-03 NOTE — Evaluation (Signed)
Physical Therapy Evaluation Patient Details Name: Victor Gould MRN: [id number] DOB: Apr 10, 1941 Today's Date: 09/03/2016   History of Present Illness  Pt adm with NSTEMI and underwent CABG. PMH - HTN, DM, prostate CA  Clinical Impression  Pt doing well with mobility and no further PT needed.  Ready for dc from PT standpoint. Pt able to complete stairs without difficulty. Pt was slightly orthostatic initially (122/62 sitting, 94/47 standing) with pressure rebounding after ambulation (127/63).      Follow Up Recommendations No PT follow up    Equipment Recommendations  None recommended by PT    Recommendations for Other Services       Precautions / Restrictions Precautions Precautions: Sternal      Mobility  Bed Mobility               General bed mobility comments: Pt up in chair  Transfers Overall transfer level: Modified independent Equipment used: None             General transfer comment: holding heart pillow to follow sternal precautions  Ambulation/Gait Ambulation/Gait assistance: Supervision Ambulation Distance (Feet): 250 Feet Assistive device: Rolling walker (2 wheeled);None Gait Pattern/deviations: Step-through pattern;Decreased stride length Gait velocity: decr Gait velocity interpretation: Below normal speed for age/gender General Gait Details: Amb in room without assistive device and in hall with RW. Pt initially lightheaded and sat for BP check (see vitals flow sheet)  Stairs Stairs: Yes Stairs assistance: Supervision Stair Management: One rail Left;Step to pattern;Forwards Number of Stairs: 4    Wheelchair Mobility    Modified Rankin (Stroke Patients Only)       Balance Overall balance assessment: Needs assistance Sitting-balance support: No upper extremity supported;Feet supported Sitting balance-Leahy Scale: Normal     Standing balance support: No upper extremity supported Standing balance-Leahy Scale: Good                                Pertinent Vitals/Pain Pain Assessment: No/denies pain    Home Living Family/patient expects to be discharged to:: Private residence Living Arrangements: Spouse/significant other Available Help at Discharge: Family;Available 24 hours/day Type of Home: House Home Access: Stairs to enter Entrance Stairs-Rails: Psychiatric nurse of Steps: 4 Home Layout: Two level;Laundry or work area in basement;Able to live on main level with bedroom/bathroom Home Equipment: Environmental consultant - 2 wheels      Prior Function Level of Independence: Independent               Hand Dominance        Extremity/Trunk Assessment   Upper Extremity Assessment Upper Extremity Assessment: Overall WFL for tasks assessed    Lower Extremity Assessment Lower Extremity Assessment: Overall WFL for tasks assessed       Communication   Communication: No difficulties  Cognition Arousal/Alertness: Awake/alert Behavior During Therapy: WFL for tasks assessed/performed Overall Cognitive Status: Within Functional Limits for tasks assessed                                        General Comments      Exercises     Assessment/Plan    PT Assessment Patent does not need any further PT services  PT Problem List         PT Treatment Interventions      PT Goals (Current goals can be found in the  Care Plan section)  Acute Rehab PT Goals PT Goal Formulation: All assessment and education complete, DC therapy    Frequency     Barriers to discharge        Co-evaluation               AM-PAC PT "6 Clicks" Daily Activity  Outcome Measure Difficulty turning over in bed (including adjusting bedclothes, sheets and blankets)?: A Little Difficulty moving from lying on back to sitting on the side of the bed? : A Little Difficulty sitting down on and standing up from a chair with arms (e.g., wheelchair, bedside commode, etc,.)?: None Help needed  moving to and from a bed to chair (including a wheelchair)?: None Help needed walking in hospital room?: None Help needed climbing 3-5 steps with a railing? : A Little 6 Click Score: 21    End of Session Equipment Utilized During Treatment: Gait belt Activity Tolerance: Patient tolerated treatment well Patient left: in chair;with call bell/phone within reach;with family/visitor present Nurse Communication: Mobility status;Other (comment) (slight orthostasis) PT Visit Diagnosis: Difficulty in walking, not elsewhere classified (R26.2)    Time: 4720-7218 PT Time Calculation (min) (ACUTE ONLY): 24 min   Charges:   PT Evaluation $PT Eval Moderate Complexity: 1 Procedure     PT G CodesMarland Kitchen        [facility] PT [facility] 09/03/2016, 9:50 AM

## 2016-09-03 NOTE — Telephone Encounter (Signed)
-----   Message from Erlene Quan, [place] sent at 09/03/2016 11:18 AM EDT ----- This pt was discharged from the hospital this morning after CABG. Dr Meda Coffee wants him to start Plavix 75 mg daily. Can you call the pt and let him know, and arrange for him to get an Rx for Plavix 75 mg (90 day supply-3 refill).  Kerin Ransom PA-C 09/03/2016 11:19 AM

## 2016-09-03 NOTE — Progress Notes (Addendum)
      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  Objective: Vital signs in last 24 hours: Temp:  [97.8 F (36.6 C)-98.4 F (36.9 C)] 98.1 F (36.7 C) (05/07 0425) Pulse Rate:  [75-83] 80 (05/07 0425) Cardiac Rhythm: Normal sinus rhythm (05/07 0700) Resp:  [18] 18 (05/07 0425) BP: (127-151)/(60-69) 135/62 (05/07 0425) SpO2:  [92 %-95 %] 94 % (05/07 0425) Weight:  [79 kg (174 lb 1.6 oz)] 79 kg (174 lb 1.6 oz) (05/07 0425)  Pre op weight 80.7 kg Current Weight  09/03/16 79 kg (174 lb 1.6 oz)       Intake/Output from previous day: 05/06 0701 - 05/07 0700 In: 1200 [P.O.:1200] Out: 240 [Urine:240]   Physical Exam:  Cardiovascular: RRR Pulmonary: Clear to auscultation bilaterally Abdomen: Soft, non tender, bowel sounds present. Extremities: Trace bilateral lower extremity edema. Wounds: Clean and dry.  No erythema or signs of infection.  Lab Results: CBC: Recent Labs  09/01/16 0541  WBC 8.9  HGB 10.0*  HCT 30.7*  PLT 198   BMET:  Recent Labs  09/01/16 0541  NA 138  K 3.3*  CL 102  CO2 23  GLUCOSE 160*  BUN 21*  CREATININE 1.70*  CALCIUM 8.0*    PT/INR:  Lab Results  Component Value Date   INR 1.49 08/28/2016   INR 1.07 08/28/2016   INR 1.13 08/27/2016   ABG:  INR: Will add last result for INR, ABG once components are confirmed Will add last 4 CBG results once components are confirmed  Assessment/Plan:  1. CV - S/p NSTEMI. Previous a fib. Maintaining SR in the 80's this am . On Amiodarone 200 mg bid, Lopressor 25 mg bid. Will restart low dose  Losartan for better BP control (creatinine stable at 1.7)-has CKD stage III). 2.  Pulmonary - On room air. Encourage incentive spirometer. 3. Volume Overload - On Lasix 40 mg IV but will change to oral. Will not need at discharge as at pre op weight and trace LE edema. 4.  Acute blood loss anemia - Last H and H 10 and 30.7 5. DM-CBGs 135/168/169 . Pre op HGA1C 7.7. Will need follow up with medical doctor after discharge. 6. Remove sutures 7. Likely discharge later today  Placido Hangartner MPA-C 09/03/2016,7:36 AM

## 2016-09-03 NOTE — Care Management Important Message (Signed)
Important Message  Patient Details  Name: Victor Gould MRN: [id number] Date of Birth: 1941-01-06   Medicare Important Message Given:  Yes    Erenest Rasher, RN 09/03/2016, 10:07 AM

## 2016-09-03 NOTE — Telephone Encounter (Signed)
LMTCB

## 2016-09-03 NOTE — Care Management Note (Addendum)
Case Management Note  Patient Details  Name: Victor Gould MRN: [id number] Date of Birth: 1940/11/09  Subjective/Objective:     NSTEMI, s/p CABG               Action/Plan: Discharge Planning: NCM spoke to pt and wife at bedside. Has RW at home. PT did not recommend HHPT. No NCM needs identified.   PCP Iran Planas MD  Expected Discharge Date:  09/03/16               Expected Discharge Plan:  [name]  In-House Referral:  NA  Discharge planning Services  CM Consult  Post Acute Care Choice:  NA Choice offered to:  NA  DME Arranged:  N/A DME Agency:  NA  HH Arranged:  NA HH Agency:  NA  Status of Service:  Completed, signed off  If discussed at [place] of Stay Meetings, dates discussed:    Additional Comments:  Erenest Rasher, [place] [phone] 09/03/2016, 10:08 AM

## 2016-09-03 NOTE — Progress Notes (Signed)
CARDIAC REHAB PHASE I   Pt in bed, awaiting PT consult to practice stairs prior to discharge, declines additional ambulation at this time. Cardiac surgery discharge education completed with pt and wife at bedside. Reviewed IS, sternal precautions, activity progression, exercise, heart healthy, carb counting, daily weights and phase 2 cardiac rehab. Pt and wife verbalized understanding. Pt has viewed cardiac surgery discharge video. Phase 2 cardiac rehab referral sent to [facility]. Pt in bed, call bell within reach.   [place], RN, BSN 09/03/2016 8:50 AM

## 2016-09-04 MED ORDER — CLOPIDOGREL BISULFATE 75 MG PO TABS: 75.0000 mg | ORAL_TABLET | Freq: Every day | ORAL | 3 refills | Status: DC

## 2016-09-04 NOTE — Telephone Encounter (Signed)
Recommendations were communicated to patient and wife, who voiced understanding of instructions to start this medication. Aware to call if any new concerns, and to keep scheduled hosp f/u w Estella Husk on the 24th.

## 2016-09-10 DIAGNOSIS — I2510 Atherosclerotic heart disease of native coronary artery without angina pectoris: Secondary | ICD-10-CM | POA: Insufficient documentation

## 2016-09-11 ENCOUNTER — Telehealth: Payer: Self-pay | Admitting: Cardiology

## 2016-09-11 NOTE — Telephone Encounter (Signed)
PT'S  WIFE  CALLED  AND STATES  THAT  PMD  DOES NOT WANT  PT  TAKING  PLAVIX  AND  ASPIRIN THINKS  IT WILL  MAKE  BLOOD  TO THIN  INFORMED  WIFE  THAT  PT'S  ARE ON THIS COMBO  ALL THE TIME,  BUT  WIFE  IS  INSISTENT ON ASKING  DR  NELSON IF  THIS  THERAPY  IS APPROPRIATE.WILL FORWARD TO DR Meda Coffee FOR  REVIEW  .Adonis Housekeeper

## 2016-09-11 NOTE — Telephone Encounter (Signed)
New Message:    Pt was seen in the hospital by Dr Meda Coffee.Please call Donna,his wife,she needs to discuss his medications.

## 2016-09-12 NOTE — Telephone Encounter (Signed)
Made the pt and wife aware of Dr Francesca Oman recommendations for the pt to remain on both ASA and Plavix, unless actively bleeding, due to recent heart attack before CABG surgery. Informed both parties that he will need to be on both meds x 1 year.  Both verbalized understanding and agrees with this plan.

## 2016-09-12 NOTE — Telephone Encounter (Signed)
Because had a heart attack before CABG surgery, he should continue taking aspirin and plavix for 1 year unless he is bleeding.

## 2016-09-20 ENCOUNTER — Ambulatory Visit (INDEPENDENT_AMBULATORY_CARE_PROVIDER_SITE_OTHER): Payer: Medicare Other | Admitting: Physician Assistant

## 2016-09-20 ENCOUNTER — Encounter: Payer: Self-pay | Admitting: Physician Assistant

## 2016-09-20 VITALS — BP 124/66 | HR 63 | Ht 69.0 in | Wt 175.0 lb

## 2016-09-20 DIAGNOSIS — N183 Chronic kidney disease, stage 3 unspecified: Secondary | ICD-10-CM | POA: Insufficient documentation

## 2016-09-20 DIAGNOSIS — I10 Essential (primary) hypertension: Secondary | ICD-10-CM

## 2016-09-20 DIAGNOSIS — E119 Type 2 diabetes mellitus without complications: Secondary | ICD-10-CM

## 2016-09-20 DIAGNOSIS — I4891 Unspecified atrial fibrillation: Secondary | ICD-10-CM | POA: Insufficient documentation

## 2016-09-20 DIAGNOSIS — Z951 Presence of aortocoronary bypass graft: Secondary | ICD-10-CM | POA: Diagnosis not present

## 2016-09-20 DIAGNOSIS — I2510 Atherosclerotic heart disease of native coronary artery without angina pectoris: Secondary | ICD-10-CM

## 2016-09-20 DIAGNOSIS — I480 Paroxysmal atrial fibrillation: Secondary | ICD-10-CM | POA: Diagnosis not present

## 2016-09-20 NOTE — Addendum Note (Signed)
Addended by: Marlis Edelson C on: 09/20/2016 04:21 PM   Modules accepted: Orders

## 2016-09-20 NOTE — Progress Notes (Signed)
Cardiology Office Note    Date:  09/20/2016   ID:  BRYNDON Gould, DOB Aug 03, 1940, MRN [id number]  PCP:  Rusty Aus, MD  Cardiologist: Dr. Sullivan Lone  Chief Complaint  Patient presents with  . Follow-up    History of Present Illness:  Victor Gould is a 76 y.o. 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 He underwent CABG 5 on 08/28/16 and had brief atrial fibrillation postop treated with amiodarone. Patient also has CK D stage III, diabetes mellitus with hemoglobin A1c of 7.7 preop, hypertension, GERD. Plavix and aspirin were recommended for a year since he had an MI prior to CABG.  Patient saw primary care at [facility] 09/10/16 and labs were done. Creatinine was 1.8 potassium 4.3 TSH was elevated at 11.738, hemoglobin stable 11.8. He had been taking his Synthroid at the same time of his omeprazole was told to take it separately.  Patient comes in today accompanied by his wife. He had trouble with tolerating pain medication and was given hydrocodone which she tolerated a little better but he still doesn't like taking it. He has some incisional pain with certain movements. Overall he is doing well and walking up to 12 minutes at a time. He wants to do cardiac rehabilitation at [facility] and also be followed in our [place] office. He denies any chest tightness, pressure, dyspnea, dyspnea on exertion, dizziness, palpitations, edema or presyncope.    Past Medical History:  Diagnosis Date  . Cancer [facility])    prostate  . Diabetes mellitus   . GERD (gastroesophageal reflux disease)   . Hemorrhoids   . Hypertension    PCP [name] at [place]  . PONV (postoperative nausea and vomiting)   . Thyroid disease     Past Surgical History:  Procedure Laterality Date  . APPENDECTOMY    . colonoscopy with polypectomy    . CORONARY ARTERY BYPASS GRAFT N/A 08/28/2016   Procedure: CORONARY ARTERY BYPASS GRAFTING  (CABG) x five, using left internal mammary artery and right leg greater saphenous vein harvested endoscopically - -LIMA to LAD, -SVG to OM, -SVG to DIAGONAL, -SEQ SVG to PDA, PLVB ;  Surgeon: [name], MD;  Location: [place];  Service: Open Heart Surgery;  Laterality: N/A;  . CYSTOSCOPY N/A 09/29/2013   Procedure: CYSTOSCOPY;  Surgeon: [name], MD;  Location: [facility];  Service: Urology;  Laterality: N/A;  . HERNIA REPAIR     inguinal bilaterally  . LEFT HEART CATH AND CORONARY ANGIOGRAPHY N/A 08/27/2016   Procedure: Left Heart Cath and Coronary Angiography;  Surgeon: [name], MD;  Location: [place] CV LAB;  Service: Cardiovascular;  Laterality: N/A;  . ROBOT ASSISTED LAPAROSCOPIC RADICAL PROSTATECTOMY  06/25/2011   Procedure: ROBOTIC ASSISTED LAPAROSCOPIC RADICAL PROSTATECTOMY LEVEL 2;  Surgeon: [name], MD;  Location: [facility];  Service: Urology;  Laterality: N/A;  . TEE WITHOUT CARDIOVERSION N/A 08/28/2016   Procedure: TRANSESOPHAGEAL ECHOCARDIOGRAM (TEE);  Surgeon: [name], MD;  Location: [place];  Service: Open Heart Surgery;  Laterality: N/A;  . URETHRAL SLING N/A 09/29/2013   Procedure: MALE SLING;  Surgeon: [name], MD;  Location: [facility];  Service: Urology;  Laterality: N/A;    Current Medications: Outpatient Medications Prior to Visit  Medication Sig Dispense Refill  . acetaminophen (TYLENOL) 500 MG tablet Take 1,000 mg by mouth daily as needed for headache (pain).    Marland Kitchen  amiodarone (PACERONE) 200 MG tablet Take 1 tablet (200 mg total) by mouth every 12 (twelve) hours. For 2 days then take Amiodarone 200 mg by mouth daily thereafter. 30 tablet 1  . aspirin EC 325 MG EC tablet Take 1 tablet (325 mg total) by mouth daily. 30 tablet 0  . atorvastatin (LIPITOR) 40 MG tablet Take 1 tablet (40 mg total) by mouth daily at 6 PM. 30 tablet 1  . b complex vitamins tablet Take 1 tablet by mouth daily after breakfast.    . cholecalciferol (VITAMIN D) 1000 units  tablet Take 1,000 Units by mouth daily after breakfast.    . clopidogrel (PLAVIX) 75 MG tablet Take 1 tablet (75 mg total) by mouth daily. 90 tablet 3  . hydrocortisone cream 1 % Apply 1 application topically daily as needed for itching.    . insulin glargine (LANTUS) 100 unit/mL SOPN Inject 17 Units into the skin daily before breakfast.    . insulin lispro (HUMALOG KWIKPEN) 100 UNIT/ML KiwkPen Inject 7-10 Units into the skin See admin instructions. Inject 7 units subcutaneously before breakfast, lunch and dinner - plus adjustment for CBG 140-180 add 1 unit, 180-220 add 3 units, >220 add 3 units    . levothyroxine (SYNTHROID, LEVOTHROID) 75 MCG tablet Take 75 mcg by mouth daily before breakfast.    . losartan (COZAAR) 25 MG tablet Take 1 tablet (25 mg total) by mouth daily after breakfast. 30 tablet 1  . metoprolol tartrate (LOPRESSOR) 25 MG tablet Take 1 tablet (25 mg total) by mouth 2 (two) times daily. 60 tablet 1  . Multiple Vitamin (MULTIVITAMIN WITH MINERALS) TABS tablet Take 1 tablet by mouth daily after breakfast.    . omeprazole (PRILOSEC) 20 MG capsule Take 20 mg by mouth daily before breakfast.     . OVER THE COUNTER MEDICATION Place 1 drop into both eyes daily as needed (dry eyes). Over the counter lubricating eye drops - must be preservative free    . oxyCODONE (OXY IR/ROXICODONE) 5 MG immediate release tablet Take one table by mouth every 4-6 hours PRN severe pain. (Patient not taking: Reported on 09/20/2016) 30 tablet 0   No facility-administered medications prior to visit.      Allergies:   Naproxen sodium; Oxycodone hcl; Sulfa antibiotics; and Tramadol   Social History   Social History  . Marital status: Married    Spouse name: N/A  . Number of children: N/A  . Years of education: N/A   Social History Main Topics  . Smoking status: Never Smoker  . Smokeless tobacco: Never Used  . Alcohol use No  . Drug use: No  . Sexual activity: Not Asked   Other Topics Concern  .  None   Social History Narrative  . None     Family History:  The patient's   family history includes Heart attack (age of onset: 29) in his mother.   ROS:   Please see the history of present illness.    Review of Systems  Constitution: Positive for weakness and malaise/fatigue.  HENT: Negative.   Cardiovascular: Positive for chest pain.  Respiratory: Negative.   Endocrine: Negative.   Hematologic/Lymphatic: Negative.   Musculoskeletal: Negative.   Gastrointestinal: Negative.   Genitourinary: Negative.    All other systems reviewed and are negative.   PHYSICAL EXAM:   VS:  BP 124/66   Pulse 63   Ht 5\' 9"  (1.753 m)   Wt 175 lb (79.4 kg)   BMI 25.84 kg/m  Physical Exam  GEN: Well nourished, well developed, in no acute distress  Neck: no JVD, carotid bruits, or masses Cardiac:Incisions healing well RRR; no murmurs, rubs, or gallops  Respiratory:  clear to auscultation bilaterally, normal work of breathing GI: soft, nontender, nondistended, + BS Ext: without cyanosis, clubbing, or edema, Good distal pulses bilaterally Neuro:  Alert and Oriented x 3 Psych: euthymic mood, full affect  Wt Readings from Last 3 Encounters:  09/20/16 175 lb (79.4 kg)  09/03/16 174 lb 1.6 oz (79 kg)  09/29/13 185 lb (83.9 kg)      Studies/Labs Reviewed:   EKG:  EKG is  ordered today.  The ekg ordered today demonstrates Normal sinus rhythm with nonspecific ST-T wave changes, no acute change  Recent Labs: 08/29/2016: ALT 16; Magnesium 2.2; TSH 1.868 09/01/2016: BUN 21; Creatinine, Ser 1.70; Hemoglobin 10.0; Platelets 198; Potassium 3.3; Sodium 138   Lipid Panel    Component Value Date/Time   CHOL 164 08/26/2016 0013   TRIG 84 08/26/2016 0013   HDL 44 08/26/2016 0013   CHOLHDL 3.7 08/26/2016 0013   VLDL 17 08/26/2016 0013   LDLCALC 103 (H) 08/26/2016 0013    Additional studies/ records that were reviewed today include:    Procedure (s):  Left Heart Cath and Coronary Angiography by  Dr. Tamala Julian on 08/27/2016:  Conclusion     Severe three-vessel coronary disease with total occlusion of the proximal LAD after the first diagonal, high-grade obstruction with superimposed thrombus in the large first diagonal which collateralizes the distal LAD, 80-90% obstruction in a small to moderate ramus intermedius, 70% stenosis and small first obtuse marginal, greater than 90% obstruction in the ostium to proximal PDA and 70% obstruction in the ongoing right coronary before a large left ventricular branch.  Preserved left ventricular systolic function, EF 27% with normal EDP of 16 mmHg     RECOMMENDATIONS:    TCT S evaluation for coronary revascularization via surgery      Coronary artery bypass grafting x5 with the left internal mammary to the left anterior descending coronary artery, reverse saphenous vein graft to the diagonal coronary artery, reverse saphenous vein graft to the obtuse marginal coronary artery, sequential reverse saphenous vein graft to the posterior descending and posterior branches of the right coronary artery with right leg greater saphenous endo vein harvesting, thigh and calf by Dr. Servando Snare on 08/28/2016.   ASSESSMENT:    1. S/P CABG x 5   2. Essential hypertension   3. Paroxysmal atrial fibrillation (HCC)   4. CKD (chronic kidney disease) stage 3, GFR 30-59 ml/min   5. Type 2 diabetes mellitus without complication, unspecified whether long term insulin use (HCC)      PLAN:  In order of problems listed above:  Status post CABG 55/1/18 recommend Plavix and aspirin for 1 year because he had a NSTEMI. Overall doing well. Some chest soreness because he is not taking any pain medications. Recommend cardiac rehabilitation which he wants to do it [facility]. [facility] make referral. He will follow-up with Dr. Rockey Situ there. Patient wants to ride to [place] for his grandson's graduation. I told him he should stop every hour and a half to 2 hours and walk  for 10 minutes if he decides to do this.  Essential hypertension controlled  PAF in normal sinus rhythm on amiodarone  CK D stage III creatinine 1.8 when checked by [facility]. Patient is trying to drink more water. Will follow-up with them.  Type 2 diabetes mellitus  followed by primary care.    Medication Adjustments/Labs and Tests Ordered: Current medicines are reviewed at length with the patient today.  Concerns regarding medicines are outlined above.  Medication changes, Labs and Tests ordered today are listed in the Patient Instructions below. Patient Instructions  Medication Instructions:  None Ordered   Labwork: None Ordered   Testing/Procedures: None Ordered   Follow-Up: None Ordered   Any Other Special Instructions Will Be Listed Below (If Applicable).   We have referred you to cardiac rehab in [place]   If you need a refill on your cardiac medications before your next appointment, please call your pharmacy.  Thank you for choosing [facility]        Signed, Ermalinda Barrios, PA-C  09/20/2016 12:43 PM    [address] [place], [place], [place]  [address] Phone: [phone]; Fax: [phone]

## 2016-09-20 NOTE — Patient Instructions (Addendum)
Medication Instructions:  None Ordered   Labwork: None Ordered   Testing/Procedures: None Ordered   Follow-Up: None Ordered   Any Other Special Instructions Will Be Listed Below (If Applicable).   We have referred you to cardiac rehab in [place]   If you need a refill on your cardiac medications before your next appointment, please call your pharmacy.  Thank you for choosing [facility]

## 2016-10-01 ENCOUNTER — Encounter: Payer: Medicare Other | Attending: Interventional Cardiology | Admitting: *Deleted

## 2016-10-01 ENCOUNTER — Encounter: Payer: Self-pay | Admitting: *Deleted

## 2016-10-01 VITALS — Ht 68.3 in | Wt 174.0 lb

## 2016-10-01 DIAGNOSIS — Z951 Presence of aortocoronary bypass graft: Secondary | ICD-10-CM | POA: Diagnosis not present

## 2016-10-01 NOTE — Progress Notes (Signed)
Daily Session Note  Patient Details  Name: Victor Gould MRN: [id number] Date of Birth: [birth year] Referring Provider:    Encounter Date: 10/01/2016  Check In:     Session Check In - 10/01/16 1314      Check-In   Location [facility]   Staff Present Heath Lark, RN, BSN, [place], [place], ACSM RCEP, Exercise Physiologist   Supervising physician immediately available to respond to emergencies See telemetry face sheet for immediately available ER MD   Medication changes reported     No   Fall or balance concerns reported    No   Warm-up and Cool-down Performed as group-led instruction   Resistance Training Performed Yes   VAD Patient? No     Pain Assessment   Currently in Pain? No/denies           Exercise Prescription Changes - 10/01/16 1300      Response to Exercise   Blood Pressure (Admit) 136/66   Blood Pressure (Exercise) 148/70   Blood Pressure (Exit) 146/70   Heart Rate (Admit) 54 bpm   Heart Rate (Exercise) 68 bpm   Heart Rate (Exit) 57 bpm   Oxygen Saturation (Admit) 100 %   Oxygen Saturation (Exercise) 96 %   Rating of Perceived Exertion (Exercise) 11      History  Smoking Status  . Never Smoker  Smokeless Tobacco  . Never Used    Goals Met:  Personal goals reviewed No report of cardiac concerns or symptoms Strength training completed today  Goals Unmet:  Not Applicable  Comments: Medical review completed today   Dr. [name] is Medical Director for [place] and [facility].

## 2016-10-01 NOTE — Progress Notes (Signed)
Cardiac Individual Treatment Plan  Patient Details  Name: Victor Gould MRN: [id number] Date of Birth: [birth year] Referring Provider:     Cardiac Rehab from 10/01/2016 in [facility] Cardiac and Pulmonary Rehab  Referring Provider  Ida Rogue MD      Initial Encounter Date:    Cardiac Rehab from 10/01/2016 in [facility] Cardiac and Pulmonary Rehab  Date  10/01/16  Referring Provider  Ida Rogue MD      Visit Diagnosis: S/P CABG x 5  Patient's Home Medications on Admission:  Current Outpatient Prescriptions:  .  acetaminophen (TYLENOL) 500 MG tablet, Take 1,000 mg by mouth daily as needed for headache (pain)., Disp: , Rfl:  .  amiodarone (PACERONE) 200 MG tablet, Take 1 tablet (200 mg total) by mouth every 12 (twelve) hours. For 2 days then take Amiodarone 200 mg by mouth daily thereafter., Disp: 30 tablet, Rfl: 1 .  aspirin EC 325 MG EC tablet, Take 1 tablet (325 mg total) by mouth daily., Disp: 30 tablet, Rfl: 0 .  atorvastatin (LIPITOR) 40 MG tablet, Take 1 tablet (40 mg total) by mouth daily at 6 PM., Disp: 30 tablet, Rfl: 1 .  b complex vitamins tablet, Take 1 tablet by mouth daily after breakfast., Disp: , Rfl:  .  cholecalciferol (VITAMIN D) 1000 units tablet, Take 1,000 Units by mouth daily after breakfast., Disp: , Rfl:  .  clopidogrel (PLAVIX) 75 MG tablet, Take 1 tablet (75 mg total) by mouth daily., Disp: 90 tablet, Rfl: 3 .  HYDROcodone-acetaminophen (NORCO/VICODIN) 5-325 MG tablet, Take 0.5 tablets by mouth 3 (three) times daily as needed., Disp: , Rfl: 0 .  hydrocortisone cream 1 %, Apply 1 application topically daily as needed for itching., Disp: , Rfl:  .  insulin glargine (LANTUS) 100 unit/mL SOPN, Inject 17 Units into the skin daily before breakfast., Disp: , Rfl:  .  insulin lispro (HUMALOG KWIKPEN) 100 UNIT/ML KiwkPen, Inject 7-10 Units into the skin See admin instructions. Inject 7 units subcutaneously before breakfast, lunch and dinner - plus adjustment for CBG  140-180 add 1 unit, 180-220 add 3 units, >220 add 3 units, Disp: , Rfl:  .  levothyroxine (SYNTHROID, LEVOTHROID) 75 MCG tablet, Take 75 mcg by mouth daily before breakfast., Disp: , Rfl:  .  losartan (COZAAR) 25 MG tablet, Take 1 tablet (25 mg total) by mouth daily after breakfast., Disp: 30 tablet, Rfl: 1 .  metoprolol tartrate (LOPRESSOR) 25 MG tablet, Take 1 tablet (25 mg total) by mouth 2 (two) times daily., Disp: 60 tablet, Rfl: 1 .  Multiple Vitamin (MULTIVITAMIN WITH MINERALS) TABS tablet, Take 1 tablet by mouth daily after breakfast., Disp: , Rfl:  .  omeprazole (PRILOSEC) 20 MG capsule, Take 20 mg by mouth daily before breakfast. , Disp: , Rfl:  .  OVER THE COUNTER MEDICATION, Place 1 drop into both eyes daily as needed (dry eyes). Over the counter lubricating eye drops - must be preservative free, Disp: , Rfl:   Past Medical History: Past Medical History:  Diagnosis Date  . Cancer [facility])    prostate  . Diabetes mellitus   . GERD (gastroesophageal reflux disease)   . Hemorrhoids   . Hypertension    PCP [name] at [place]  . PONV (postoperative nausea and vomiting)   . Thyroid disease     Tobacco Use: History  Smoking Status  . Never Smoker  Smokeless Tobacco  . Never Used    Labs: Recent Review Flowsheet Data  Labs for ITP Cardiac and Pulmonary Rehab Latest Ref Rng & Units 08/28/2016 08/29/2016 08/29/2016 08/29/2016 08/29/2016   Cholestrol 0 - 200 mg/dL - - - - -   LDLCALC 0 - 99 mg/dL - - - - -   HDL >40 mg/dL - - - - -   Trlycerides <150 mg/dL - - - - -   Hemoglobin A1c 4.8 - 5.6 % - - - - -   PHART 7.350 - 7.450 7.361 7.420 7.418 7.391 -   PCO2ART 32.0 - 48.0 mmHg 32.7 30.2(L) 29.9(L) 30.5(L) -   HCO3 20.0 - 28.0 mmol/L 18.5(L) 19.6(L) 19.3(L) 18.5(L) -   TCO2 0 - 100 mmol/L 20 21 20 19 23    ACIDBASEDEF 0.0 - 2.0 mmol/L 6.0(H) 4.0(H) 4.0(H) 6.0(H) -   O2SAT % 93.0 97.0 94.0 93.0 -       Exercise Target Goals: Date: 10/01/16  Exercise Program  Goal: Individual exercise prescription set with THRR, safety & activity barriers. Participant demonstrates ability to understand and report RPE using BORG scale, to self-measure pulse accurately, and to acknowledge the importance of the exercise prescription.  Exercise Prescription Goal: Starting with aerobic activity 30 plus minutes a day, 3 days per week for initial exercise prescription. Provide home exercise prescription and guidelines that participant acknowledges understanding prior to discharge.  Activity Barriers & Risk Stratification:     Activity Barriers & Cardiac Risk Stratification - 10/01/16 1320      Activity Barriers & Cardiac Risk Stratification   Activity Barriers Deconditioning;Muscular Weakness;Balance Concerns   Cardiac Risk Stratification High      6 Minute Walk:     6 Minute Walk    Row Name 10/01/16 1417         6 Minute Walk   Phase Initial     Distance 1100 feet     Walk Time 6 minutes     # of Rest Breaks 0     MPH 2.08     METS 2.01     RPE 11     VO2 Peak 7.04     Symptoms No     Resting HR 54 bpm     Resting BP 136/66     Max Ex. HR 68 bpm     Max Ex. BP 142/70     2 Minute Post BP 146/70  recheck 126 74        Oxygen Initial Assessment:   Oxygen Re-Evaluation:   Oxygen Discharge (Final Oxygen Re-Evaluation):   Initial Exercise Prescription:     Initial Exercise Prescription - 10/01/16 1400      Date of Initial Exercise RX and Referring Provider   Date 10/01/16   Referring Provider Ida Rogue MD     Treadmill   MPH 1.3   Grade 0   Minutes 15   METs 2     Recumbant Bike   Level 1   RPM 40   Watts 10   Minutes 15   METs 2.39     NuStep   Level 1   SPM 80   Minutes 15   METs 2     Prescription Details   Frequency (times per week) 2   Duration Progress to 45 minutes of aerobic exercise without signs/symptoms of physical distress     Intensity   THRR 40-80% of Max Heartrate 90-125   Ratings of  Perceived Exertion 11-15   Perceived Dyspnea 0-4     Progression   Progression Continue to progress workloads  to maintain intensity without signs/symptoms of physical distress.     Resistance Training   Training Prescription Yes   Weight 3 lbs   Reps 10-15      Perform Capillary Blood Glucose checks as needed.  Exercise Prescription Changes:     Exercise Prescription Changes    Row Name 10/01/16 1300             Response to Exercise   Blood Pressure (Admit) 136/66       Blood Pressure (Exercise) 148/70       Blood Pressure (Exit) 146/70       Heart Rate (Admit) 54 bpm       Heart Rate (Exercise) 68 bpm       Heart Rate (Exit) 57 bpm       Oxygen Saturation (Admit) 100 %       Oxygen Saturation (Exercise) 96 %       Rating of Perceived Exertion (Exercise) 11       Symptoms none       Comments walk test results          Exercise Comments:   Exercise Goals and Review:     Exercise Goals    Row Name 10/01/16 1420             Exercise Goals   Increase Physical Activity Yes       Intervention Provide advice, education, support and counseling about physical activity/exercise needs.;Develop an individualized exercise prescription for aerobic and resistive training based on initial evaluation findings, risk stratification, comorbidities and participant's personal goals.       Expected Outcomes Achievement of increased cardiorespiratory fitness and enhanced flexibility, muscular endurance and strength shown through measurements of functional capacity and personal statement of participant.       Increase Strength and Stamina Yes       Intervention Provide advice, education, support and counseling about physical activity/exercise needs.;Develop an individualized exercise prescription for aerobic and resistive training based on initial evaluation findings, risk stratification, comorbidities and participant's personal goals.       Expected Outcomes Achievement of  increased cardiorespiratory fitness and enhanced flexibility, muscular endurance and strength shown through measurements of functional capacity and personal statement of participant.          Exercise Goals Re-Evaluation :   Discharge Exercise Prescription (Final Exercise Prescription Changes):     Exercise Prescription Changes - 10/01/16 1300      Response to Exercise   Blood Pressure (Admit) 136/66   Blood Pressure (Exercise) 148/70   Blood Pressure (Exit) 146/70   Heart Rate (Admit) 54 bpm   Heart Rate (Exercise) 68 bpm   Heart Rate (Exit) 57 bpm   Oxygen Saturation (Admit) 100 %   Oxygen Saturation (Exercise) 96 %   Rating of Perceived Exertion (Exercise) 11   Symptoms none   Comments walk test results      Nutrition:  Target Goals: Understanding of nutrition guidelines, daily intake of sodium 1500mg , cholesterol 200mg , calories 30% from fat and 7% or less from saturated fats, daily to have 5 or more servings of fruits and vegetables.  Biometrics:     Pre Biometrics - 10/01/16 1420      Pre Biometrics   Height 5' 8.3" (1.735 m)   Weight 174 lb (78.9 kg)   Waist Circumference 35 inches   Hip Circumference 38 inches   Waist to Hip Ratio 0.92 %   BMI (Calculated) 26.3   Single Leg Stand 1.81  seconds       Nutrition Therapy Plan and Nutrition Goals:     Nutrition Therapy & Goals - 10/01/16 1320      Intervention Plan   Intervention Prescribe, educate and counsel regarding individualized specific dietary modifications aiming towards targeted core components such as weight, hypertension, lipid management, diabetes, heart failure and other comorbidities.   Expected Outcomes Short Term Goal: Understand basic principles of dietary content, such as calories, fat, sodium, cholesterol and nutrients.;Short Term Goal: A plan has been developed with personal nutrition goals set during dietitian appointment.;Long Term Goal: Adherence to prescribed nutrition plan.       Nutrition Discharge: Rate Your Plate Scores:     Nutrition Assessments - 10/01/16 1321      MEDFICTS Scores   Pre Score 41      Nutrition Goals Re-Evaluation:   Nutrition Goals Discharge (Final Nutrition Goals Re-Evaluation):   Psychosocial: Target Goals: Acknowledge presence or absence of significant depression and/or stress, maximize coping skills, provide positive support system. Participant is able to verbalize types and ability to use techniques and skills needed for reducing stress and depression.   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      Quality of Life Scores:      Quality of Life - 10/01/16 1327      Quality of Life Scores   Health/Function Pre 25.29 %   Socioeconomic Pre 30 %   Psych/Spiritual Pre 29.14 %   Family Pre 30 %   GLOBAL Pre 27.75 %      PHQ-9: Recent Review Flowsheet Data    Depression screen [facility] 2/9 10/01/2016   Decreased Interest 0   Down, Depressed, Hopeless 0   PHQ - 2 Score 0   Altered sleeping 3   Tired, decreased energy 3   Change in appetite 0   Feeling bad or failure about yourself  0   Trouble concentrating 0   Moving slowly or fidgety/restless 0   Suicidal thoughts 0   PHQ-9 Score 6   Difficult doing work/chores Somewhat difficult     Interpretation of Total Score  Total Score Depression Severity:  1-4 = Minimal depression, 5-9 = Mild depression, 10-14 = Moderate depression, 15-19 = Moderately severe depression, 20-27 = Severe depression   Psychosocial Evaluation and Intervention:   Psychosocial Re-Evaluation:   Psychosocial Discharge (Final Psychosocial Re-Evaluation):   Vocational Rehabilitation: Provide vocational rehab assistance to qualifying candidates.   Vocational Rehab Evaluation & Intervention:     Vocational Rehab - 10/01/16 1333       Initial Vocational Rehab Evaluation & Intervention   Assessment shows need for Vocational Rehabilitation No      Education: Education Goals: Education classes will be provided on a weekly basis, covering required topics. Participant will state understanding/return demonstration of topics presented.  Learning Barriers/Preferences:     Learning Barriers/Preferences - 10/01/16 1333      Learning Barriers/Preferences   Learning Barriers Hearing   Learning Preferences None      Education Topics: General Nutrition Guidelines/Fats and Fiber: -Group instruction provided by verbal, written material, models and posters to present the general guidelines for heart healthy nutrition. Gives an explanation and review of dietary fats and fiber.   Controlling Sodium/Reading Food Labels: -Group verbal and written material supporting the discussion of sodium  use in heart healthy nutrition. Review and explanation with models, verbal and written materials for utilization of the food label.   Exercise Physiology & Risk Factors: - Group verbal and written instruction with models to review the exercise physiology of the cardiovascular system and associated critical values. Details cardiovascular disease risk factors and the goals associated with each risk factor.   Aerobic Exercise & Resistance Training: - Gives group verbal and written discussion on the health impact of inactivity. On the components of aerobic and resistive training programs and the benefits of this training and how to safely progress through these programs.   Flexibility, Balance, General Exercise Guidelines: - Provides group verbal and written instruction on the benefits of flexibility and balance training programs. Provides general exercise guidelines with specific guidelines to those with heart or lung disease. Demonstration and skill practice provided.   Stress Management: - Provides group verbal and written instruction about  the health risks of elevated stress, cause of high stress, and healthy ways to reduce stress.   Depression: - Provides group verbal and written instruction on the correlation between heart/lung disease and depressed mood, treatment options, and the stigmas associated with seeking treatment.   Anatomy & Physiology of the Heart: - Group verbal and written instruction and models provide basic cardiac anatomy and physiology, with the coronary electrical and arterial systems. Review of: AMI, Angina, Valve disease, Heart Failure, Cardiac Arrhythmia, Pacemakers, and the ICD.   Cardiac Procedures: - Group verbal and written instruction and models to describe the testing methods done to diagnose heart disease. Reviews the outcomes of the test results. Describes the treatment choices: Medical Management, Angioplasty, or Coronary Bypass Surgery.   Cardiac Medications: - Group verbal and written instruction to review commonly prescribed medications for heart disease. Reviews the medication, class of the drug, and side effects. Includes the steps to properly store meds and maintain the prescription regimen.   Go Sex-Intimacy & Heart Disease, Get SMART - Goal Setting: - Group verbal and written instruction through game format to discuss heart disease and the return to sexual intimacy. Provides group verbal and written material to discuss and apply goal setting through the application of the S.M.A.R.T. Method.   Other Matters of the Heart: - Provides group verbal, written materials and models to describe Heart Failure, Angina, Valve Disease, and Diabetes in the realm of heart disease. Includes description of the disease process and treatment options available to the cardiac patient.   Exercise & Equipment Safety: - Individual verbal instruction and demonstration of equipment use and safety with use of the equipment.   Cardiac Rehab from 10/01/2016 in [facility] Cardiac and Pulmonary Rehab  Date  10/01/16   Educator  Sb  Instruction Review Code  2- meets goals/outcomes      Infection Prevention: - Provides verbal and written material to individual with discussion of infection control including proper hand washing and proper equipment cleaning during exercise session.   Cardiac Rehab from 10/01/2016 in [facility] Cardiac and Pulmonary Rehab  Date  10/01/16  Educator  Sb  Instruction Review Code  2- meets goals/outcomes      Falls Prevention: - Provides verbal and written material to individual with discussion of falls prevention and safety.   Cardiac Rehab from 10/01/2016 in [facility] Cardiac and Pulmonary Rehab  Date  10/01/16  Educator  SB  Instruction Review Code  2- meets goals/outcomes      Diabetes: - Individual verbal and written instruction to review signs/symptoms of diabetes, desired ranges of  glucose level fasting, after meals and with exercise. Advice that pre and post exercise glucose checks will be done for 3 sessions at entry of program.   Cardiac Rehab from 10/01/2016 in [facility] Cardiac and Pulmonary Rehab  Date  10/01/16  Educator  SB  Instruction Review Code  2- meets goals/outcomes       Knowledge Questionnaire Score:     Knowledge Questionnaire Score - 10/01/16 1333      Knowledge Questionnaire Score   Pre Score 18/28      Core Components/Risk Factors/Patient Goals at Admission:     Personal Goals and Risk Factors at Admission - 10/01/16 1324      Core Components/Risk Factors/Patient Goals on Admission    Weight Management Yes;Weight Maintenance   Intervention Weight Management: Develop a combined nutrition and exercise program designed to reach desired caloric intake, while maintaining appropriate intake of nutrient and fiber, sodium and fats, and appropriate energy expenditure required for the weight goal.   Admit Weight 174 lb (78.9 kg)   Goal Weight: Short Term 172 lb (78 kg)   Goal Weight: Long Term 170 lb (77.1 kg)   Expected Outcomes Short Term: Continue to  assess and modify interventions until short term weight is achieved;Long Term: Adherence to nutrition and physical activity/exercise program aimed toward attainment of established weight goal;Weight Maintenance: Understanding of the daily nutrition guidelines, which includes 25-35% calories from fat, 7% or less cal from saturated fats, less than 200mg  cholesterol, less than 1.5gm of sodium, & 5 or more servings of fruits and vegetables daily   Diabetes Yes   Intervention Provide education about signs/symptoms and action to take for hypo/hyperglycemia.;Provide education about proper nutrition, including hydration, and aerobic/resistive exercise prescription along with prescribed medications to achieve blood glucose in normal ranges: Fasting glucose 65-99 mg/dL   Expected Outcomes Short Term: Participant verbalizes understanding of the signs/symptoms and immediate care of hyper/hypoglycemia, proper foot care and importance of medication, aerobic/resistive exercise and nutrition plan for blood glucose control.;Long Term: Attainment of HbA1C < 7%.  LAst A1C was 7.7%   Hypertension Yes   Intervention Provide education on lifestyle modifcations including regular physical activity/exercise, weight management, moderate sodium restriction and increased consumption of fresh fruit, vegetables, and low fat dairy, alcohol moderation, and smoking cessation.;Monitor prescription use compliance.   Expected Outcomes Short Term: Continued assessment and intervention until BP is < 140/29mm HG in hypertensive participants. < 130/71mm HG in hypertensive participants with diabetes, heart failure or chronic kidney disease.;Long Term: Maintenance of blood pressure at goal levels.   Lipids Yes   Intervention Provide education and support for participant on nutrition & aerobic/resistive exercise along with prescribed medications to achieve LDL 70mg , HDL >40mg .  Medication started recently   Expected Outcomes Short Term:  Participant states understanding of desired cholesterol values and is compliant with medications prescribed. Participant is following exercise prescription and nutrition guidelines.;Long Term: Cholesterol controlled with medications as prescribed, with individualized exercise RX and with personalized nutrition plan. Value goals: LDL < 70mg , HDL > 40 mg.      Core Components/Risk Factors/Patient Goals Review:    Core Components/Risk Factors/Patient Goals at Discharge (Final Review):    ITP Comments:     ITP Comments    Row Name 10/01/16 1316           ITP Comments Medical Review Completed today. Initial ITP created. Diaagnosis documnetation can be found in [facility] 08/25/2016 Admission          Comments: Initial ITP

## 2016-10-01 NOTE — Patient Instructions (Signed)
Patient Instructions  Patient Details  Name: Victor Gould MRN: [id number] Date of Birth: [birth year] Referring Provider:  [name], MD  Below are the personal goals you chose as well as exercise and nutrition goals. Our goal is to help you keep on track towards obtaining and maintaining your goals. We will be discussing your progress on these goals with you throughout the program.  Initial Exercise Prescription:     Initial Exercise Prescription - 10/01/16 1400      Date of Initial Exercise RX and Referring Provider   Date 10/01/16   Referring Provider Ida Rogue MD     Treadmill   MPH 1.3   Grade 0   Minutes 15   METs 2     Recumbant Bike   Level 1   RPM 40   Watts 10   Minutes 15   METs 2.39     NuStep   Level 1   SPM 80   Minutes 15   METs 2     Prescription Details   Frequency (times per week) 2   Duration Progress to 45 minutes of aerobic exercise without signs/symptoms of physical distress     Intensity   THRR 40-80% of Max Heartrate 90-125   Ratings of Perceived Exertion 11-15   Perceived Dyspnea 0-4     Progression   Progression Continue to progress workloads to maintain intensity without signs/symptoms of physical distress.     Resistance Training   Training Prescription Yes   Weight 3 lbs   Reps 10-15      Exercise Goals: Frequency: Be able to perform aerobic exercise three times per week working toward 3-5 days per week.  Intensity: Work with a perceived exertion of 11 (fairly light) - 15 (hard) as tolerated. Follow your new exercise prescription and watch for changes in prescription as you progress with the program. Changes will be reviewed with you when they are made.  Duration: You should be able to do 30 minutes of continuous aerobic exercise in addition to a 5 minute warm-up and a 5 minute cool-down routine.  Nutrition Goals: Your personal nutrition goals will be established when you do your nutrition analysis with the  dietician.  The following are nutrition guidelines to follow: Cholesterol < 200mg /day Sodium < 1500mg /day Fiber: Men over 50 yrs - 30 grams per day  Personal Goals:     Personal Goals and Risk Factors at Admission - 10/01/16 1324      Core Components/Risk Factors/Patient Goals on Admission    Weight Management Yes;Weight Maintenance   Intervention Weight Management: Develop a combined nutrition and exercise program designed to reach desired caloric intake, while maintaining appropriate intake of nutrient and fiber, sodium and fats, and appropriate energy expenditure required for the weight goal.   Admit Weight 174 lb (78.9 kg)   Goal Weight: Short Term 172 lb (78 kg)   Goal Weight: Long Term 170 lb (77.1 kg)   Expected Outcomes Short Term: Continue to assess and modify interventions until short term weight is achieved;Long Term: Adherence to nutrition and physical activity/exercise program aimed toward attainment of established weight goal;Weight Maintenance: Understanding of the daily nutrition guidelines, which includes 25-35% calories from fat, 7% or less cal from saturated fats, less than 200mg  cholesterol, less than 1.5gm of sodium, & 5 or more servings of fruits and vegetables daily   Diabetes Yes   Intervention Provide education about signs/symptoms and action to take for hypo/hyperglycemia.;Provide education about proper nutrition, including hydration,  and aerobic/resistive exercise prescription along with prescribed medications to achieve blood glucose in normal ranges: Fasting glucose 65-99 mg/dL   Expected Outcomes Short Term: Participant verbalizes understanding of the signs/symptoms and immediate care of hyper/hypoglycemia, proper foot care and importance of medication, aerobic/resistive exercise and nutrition plan for blood glucose control.;Long Term: Attainment of HbA1C < 7%.  LAst A1C was 7.7%   Hypertension Yes   Intervention Provide education on lifestyle modifcations  including regular physical activity/exercise, weight management, moderate sodium restriction and increased consumption of fresh fruit, vegetables, and low fat dairy, alcohol moderation, and smoking cessation.;Monitor prescription use compliance.   Expected Outcomes Short Term: Continued assessment and intervention until BP is < 140/59mm HG in hypertensive participants. < 130/43mm HG in hypertensive participants with diabetes, heart failure or chronic kidney disease.;Long Term: Maintenance of blood pressure at goal levels.   Lipids Yes   Intervention Provide education and support for participant on nutrition & aerobic/resistive exercise along with prescribed medications to achieve LDL 70mg , HDL >40mg .  Medication started recently   Expected Outcomes Short Term: Participant states understanding of desired cholesterol values and is compliant with medications prescribed. Participant is following exercise prescription and nutrition guidelines.;Long Term: Cholesterol controlled with medications as prescribed, with individualized exercise RX and with personalized nutrition plan. Value goals: LDL < 70mg , HDL > 40 mg.      Tobacco Use Initial Evaluation: History  Smoking Status  . Never Smoker  Smokeless Tobacco  . Never Used    Copy of goals given to participant.

## 2016-10-09 ENCOUNTER — Encounter: Payer: Medicare Other | Admitting: Respiratory Therapy

## 2016-10-09 DIAGNOSIS — Z951 Presence of aortocoronary bypass graft: Secondary | ICD-10-CM | POA: Diagnosis not present

## 2016-10-09 LAB — GLUCOSE, CAPILLARY
GLUCOSE-CAPILLARY: 145 mg/dL — AB (ref 65–99)
Glucose-Capillary: 155 mg/dL — ABNORMAL HIGH (ref 65–99)

## 2016-10-09 NOTE — Progress Notes (Signed)
Daily Session Note  Patient Details  Name: Victor Gould MRN: [id number] Date of Birth: 29-Oct-1940 Referring Provider:     Cardiac Rehab from 10/01/2016 in [facility] Cardiac and Pulmonary Rehab  Referring Provider  Victor Rogue MD      Encounter Date: 10/09/2016  Check In:     Session Check In - 10/09/16 0847      Check-In   Location [facility]-Cardiac & Pulmonary Rehab   Staff Present Heath Lark, RN, BSN, CCRP;Jessica Luan Pulling, MA, ACSM RCEP, Exercise Physiologist;Laureen Owens Shark, BS, RRT, Respiratory Therapist;Other  Roanna Epley RN, Darcella Gasman RRT   Supervising physician immediately available to respond to emergencies See telemetry face sheet for immediately available ER MD   Medication changes reported     No   Fall or balance concerns reported    No   Warm-up and Cool-down Performed on first and last piece of equipment   Resistance Training Performed Yes   VAD Patient? No     Pain Assessment   Currently in Pain? No/denies   Multiple Pain Sites No         History  Smoking Status  . Never Smoker  Smokeless Tobacco  . Never Used    Goals Met:  Exercise tolerated well No report of cardiac concerns or symptoms Strength training completed today  Goals Unmet:  Not Applicable  Comments: First full day of exercise!  Patient was oriented to gym and equipment including functions, settings, policies, and procedures.  Patient's individual exercise prescription and treatment plan were reviewed.  All starting workloads were established based on the results of the 6 minute walk test done at initial orientation visit.  The plan for exercise progression was also introduced and progression will be customized based on patient's performance and goals.   Dr. Emily Gould is Medical Director for [name] and [facility].

## 2016-10-11 ENCOUNTER — Encounter: Payer: Medicare Other | Admitting: *Deleted

## 2016-10-11 ENCOUNTER — Ambulatory Visit: Payer: Medicare Other | Admitting: Cardiothoracic Surgery

## 2016-10-11 DIAGNOSIS — Z951 Presence of aortocoronary bypass graft: Secondary | ICD-10-CM

## 2016-10-11 LAB — GLUCOSE, CAPILLARY
GLUCOSE-CAPILLARY: 152 mg/dL — AB (ref 65–99)
Glucose-Capillary: 105 mg/dL — ABNORMAL HIGH (ref 65–99)

## 2016-10-11 NOTE — Progress Notes (Signed)
Daily Session Note  Patient Details  Name: Victor Gould MRN: [id number] Date of Birth: 1941/02/14 Referring Provider:     Cardiac Rehab from 10/01/2016 in [facility] Cardiac and Pulmonary Rehab  Referring Provider  Ida Rogue MD      Encounter Date: 10/11/2016  Check In:     Session Check In - 10/11/16 0840      Check-In   Location [facility]-Cardiac & Pulmonary Rehab   Staff Present Nada Maclachlan, BA, ACSM CEP, Exercise Physiologist;Golden Emile Luan Pulling, MA, ACSM RCEP, Exercise Physiologist;Meredith Sherryll Burger, RN BSN   Supervising physician immediately available to respond to emergencies See telemetry face sheet for immediately available ER MD   Medication changes reported     No   Fall or balance concerns reported    No   Warm-up and Cool-down Performed on first and last piece of equipment   Resistance Training Performed Yes   VAD Patient? No     Pain Assessment   Currently in Pain? No/denies   Multiple Pain Sites No           Exercise Prescription Changes - 10/11/16 1100      Response to Exercise   Blood Pressure (Admit) 126/64   Blood Pressure (Exercise) 124/64   Blood Pressure (Exit) 124/64   Heart Rate (Admit) 69 bpm   Heart Rate (Exercise) 91 bpm   Heart Rate (Exit) 58 bpm   Rating of Perceived Exertion (Exercise) 13   Symptoms none   Comments only second day of exercise   Duration Progress to 45 minutes of aerobic exercise without signs/symptoms of physical distress   Intensity THRR unchanged     Progression   Progression Continue to progress workloads to maintain intensity without signs/symptoms of physical distress.   Average METs 2.16     Resistance Training   Training Prescription Yes   Weight 3 lbs   Reps 10-15     Interval Training   Interval Training No     Treadmill   MPH 1.3   Grade 0   Minutes 15   METs 2     Recumbant Bike   Level 1   Watts 13   Minutes 15   METs 2.39     NuStep   Level 1   Minutes 15   METs 2.1      History   Smoking Status  . Never Smoker  Smokeless Tobacco  . Never Used    Goals Met:  Exercise tolerated well No report of cardiac concerns or symptoms Strength training completed today  Goals Unmet:  Not Applicable  Comments: Pt able to follow exercise prescription today without complaint.  Will continue to monitor for progression.    Dr. [name] is Medical Director for [place] and [facility].

## 2016-10-12 ENCOUNTER — Other Ambulatory Visit: Payer: Self-pay | Admitting: Cardiothoracic Surgery

## 2016-10-12 DIAGNOSIS — Z951 Presence of aortocoronary bypass graft: Secondary | ICD-10-CM

## 2016-10-15 ENCOUNTER — Ambulatory Visit
Admission: RE | Admit: 2016-10-15 | Discharge: 2016-10-15 | Disposition: A | Discharge status: Home / Self Care | Payer: Medicare Other | Source: Ambulatory Visit | Attending: Cardiothoracic Surgery | Admitting: Cardiothoracic Surgery

## 2016-10-15 ENCOUNTER — Ambulatory Visit (INDEPENDENT_AMBULATORY_CARE_PROVIDER_SITE_OTHER): Payer: Self-pay | Admitting: Surgical

## 2016-10-15 VITALS — BP 157/70 | HR 57 | Resp 16 | Ht 69.0 in | Wt 175.8 lb

## 2016-10-15 DIAGNOSIS — I97648 Postprocedural seroma of a circulatory system organ or structure following other circulatory system procedure: Secondary | ICD-10-CM

## 2016-10-15 DIAGNOSIS — Z951 Presence of aortocoronary bypass graft: Secondary | ICD-10-CM

## 2016-10-15 NOTE — Progress Notes (Signed)
[address]       [place] [address]             [phone]      Victor Gould Medical Record #[id number] Date of Birth: 1941/02/09  Referring: Dorothy Spark, MD Primary Care: Rusty Aus, MD  Chief Complaint:   POST OP FOLLOW UP DATE OF PROCEDURE:  08/28/2016 DATE OF DISCHARGE:                              OPERATIVE REPORT   PREOPERATIVE DIAGNOSIS:  Coronary occlusive disease with unstable angina.  POSTOPERATIVE DIAGNOSIS:  Coronary occlusive disease with unstable angina.  SURGICAL PROCEDURE:  Coronary artery bypass grafting x5 with the left internal mammary to the left anterior descending coronary artery, reverse saphenous vein graft to the diagonal coronary artery, reverse saphenous vein graft to the obtuse marginal coronary artery, sequential reverse saphenous vein graft to the posterior descending and posterior branches of the right coronary artery with right leg greater saphenous endo vein harvesting, thigh and calf.  SURGEON:  Lanelle Bal, MD.  FIRST ASSISTANT:  Ellwood Handler, PA.  History of Present Illness:    The patient is a 76 year old male status post the above described procedure presents to the office on today's date for routine postoperative follow-up. He says that he is making good overall progress but does find that he does still continue to fatigue somewhat easily. He does noted to trend is improving over time. He has not had any fevers, chills or other constitutional symptoms. He denies chest pain or shortness of breath. He has no longer taking any pain medication.      Past Medical History:  Diagnosis Date  . Cancer [facility])    prostate  . Diabetes mellitus   . GERD (gastroesophageal reflux disease)   . Hemorrhoids   . Hypertension    PCP [name] at [place]  . PONV (postoperative nausea and vomiting)   . Thyroid disease      History  Smoking Status  . Never Smoker    Smokeless Tobacco  . Never Used    History  Alcohol Use No     Allergies  Allergen Reactions  . Naproxen Sodium Shortness Of Breath  . Oxycodone Hcl Nausea And Vomiting  . Sulfa Antibiotics Rash and Hives  . Tramadol Other (See Comments)    dizziness    Current Outpatient Prescriptions  Medication Sig Dispense Refill  . acetaminophen (TYLENOL) 500 MG tablet Take 1,000 mg by mouth daily as needed for headache (pain).    Marland Kitchen amiodarone (PACERONE) 200 MG tablet Take 1 tablet (200 mg total) by mouth every 12 (twelve) hours. For 2 days then take Amiodarone 200 mg by mouth daily thereafter. 30 tablet 1  . aspirin EC 325 MG EC tablet Take 1 tablet (325 mg total) by mouth daily. 30 tablet 0  . atorvastatin (LIPITOR) 40 MG tablet Take 1 tablet (40 mg total) by mouth daily at 6 PM. 30 tablet 1  . b complex vitamins tablet Take 1 tablet by mouth daily after breakfast.    . cholecalciferol (VITAMIN D) 1000 units tablet Take 1,000 Units by mouth daily after breakfast.    . clopidogrel (PLAVIX) 75 MG tablet Take 1 tablet (75 mg total) by mouth daily. 90 tablet 3  . hydrocortisone cream 1 % Apply 1 application topically daily as needed for itching.    Marland Kitchen  insulin glargine (LANTUS) 100 unit/mL SOPN Inject 17 Units into the skin daily before breakfast.    . insulin lispro (HUMALOG KWIKPEN) 100 UNIT/ML KiwkPen Inject 7-10 Units into the skin See admin instructions. Inject 7 units subcutaneously before breakfast, lunch and dinner - plus adjustment for CBG 140-180 add 1 unit, 180-220 add 3 units, >220 add 3 units    . levothyroxine (SYNTHROID, LEVOTHROID) 75 MCG tablet Take 75 mcg by mouth daily before breakfast.    . losartan (COZAAR) 25 MG tablet Take 1 tablet (25 mg total) by mouth daily after breakfast. 30 tablet 1  . metoprolol tartrate (LOPRESSOR) 25 MG tablet Take 1 tablet (25 mg total) by mouth 2 (two) times daily. 60 tablet 1  . Multiple Vitamin (MULTIVITAMIN WITH MINERALS) TABS tablet Take 1  tablet by mouth daily after breakfast.    . omeprazole (PRILOSEC) 20 MG capsule Take 20 mg by mouth daily before breakfast.     . OVER THE COUNTER MEDICATION Place 1 drop into both eyes daily as needed (dry eyes). Over the counter lubricating eye drops - must be preservative free     No current facility-administered medications for this visit.        Physical Exam: BP (!) 157/70 (BP Location: Left Arm, Patient Position: Sitting, Cuff Size: Large)   Pulse (!) 57   Resp 16   Ht 5\' 9"  (1.753 m)   Wt 175 lb 12.8 oz (79.7 kg)   SpO2 98% Comment: RA  BMI 25.96 kg/m   General appearance: alert, cooperative and no distress Heart: regular rate and rhythm Lungs: clear to auscultation bilaterally Abdomen: soft, non-tender; bowel sounds normal; no masses,  no organomegaly Extremities: extremities normal, atraumatic, no cyanosis or edema and small serome right LE below knee, non-tender or erethematous Wound: incis healing well   Diagnostic Studies & Laboratory data:     Recent Radiology Findings:   Dg Chest 2 View  Result Date: 10/15/2016 CLINICAL DATA:  Mid chest discomfort and fatigue, 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 Tortuous thoracic aorta. Moderate-sized hiatal hernia. Pulmonary vascularity normal. Lungs clear. Resolution of previously identified bibasilar effusions and atelectasis. No pneumothorax. 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 IMPRESSION: Borderline enlargement of cardiac silhouette. Moderate-sized hiatal hernia. Improved bibasilar aeration since previous study. Electronically Signed   By: Lavonia Dana M.D.   On: 10/15/2016 14:40      Recent Lab Findings: Lab Results  Component Value Date   WBC 8.9 09/01/2016   HGB 10.0 (L) 09/01/2016   HCT 30.7 (L) 09/01/2016   PLT 198  09/01/2016   GLUCOSE 160 (H) 09/01/2016   CHOL 164 08/26/2016   TRIG 84 08/26/2016   HDL 44 08/26/2016   LDLCALC 103 (H) 08/26/2016   ALT 16 (L) 08/29/2016   AST 36 08/29/2016   NA 138 09/01/2016   K 3.3 (L) 09/01/2016   CL 102 09/01/2016   CREATININE 1.70 (H) 09/01/2016   BUN 21 (H) 09/01/2016   CO2 23 09/01/2016   TSH 1.868 08/29/2016   INR 1.49 08/28/2016   HGBA1C 7.7 (H) 08/28/2016      Assessment / Plan:  Overall the patient is doing quite well. He has been seen by cardiology follow-up and will be continued to be managed from a medical perspective by them. We will see again on a when necessary basis for  any surgically related issues. I did advise him to give our office a call if there is any changes in the seroma to include pain or evidence of infection.         Victor Gould E, PA-C 10/15/2016 3:09 PM

## 2016-10-15 NOTE — Patient Instructions (Signed)
Discussed activity progression including lifting restrictions, driving and sexual activity

## 2016-10-16 ENCOUNTER — Encounter: Payer: Medicare Other | Admitting: Respiratory Therapy

## 2016-10-16 DIAGNOSIS — Z951 Presence of aortocoronary bypass graft: Secondary | ICD-10-CM | POA: Diagnosis not present

## 2016-10-16 LAB — GLUCOSE, CAPILLARY: GLUCOSE-CAPILLARY: 83 mg/dL (ref 65–99)

## 2016-10-16 NOTE — Progress Notes (Signed)
Daily Session Note  Patient Details  Name: XAVIOUS SHARRAR MRN: [id number] Date of Birth: 1940/07/15 Referring Provider:     Cardiac Rehab from 10/01/2016 in [facility] Cardiac and Pulmonary Rehab  Referring Provider  Ida Rogue MD      Encounter Date: 10/16/2016  Check In:     Session Check In - 10/16/16 0830      Check-In   Location [facility]-Cardiac & Pulmonary Rehab   Staff Present Heath Lark, RN, BSN, CCRP;Jessica Jewett, MA, ACSM RCEP, Exercise Physiologist;Joseph Clayborn [place], [place], RRT, Respiratory Therapist   Supervising physician immediately available to respond to emergencies See telemetry face sheet for immediately available ER MD   Medication changes reported     No   Fall or balance concerns reported    No   Warm-up and Cool-down Performed on first and last piece of equipment   Resistance Training Performed Yes   VAD Patient? No     Pain Assessment   Currently in Pain? No/denies   Multiple Pain Sites No         History  Smoking Status   Never Smoker  Smokeless Tobacco   Never Used    Goals Met:  Proper associated with RPD/PD & O2 Sat Independence with exercise equipment Exercise tolerated well No report of cardiac concerns or symptoms Strength training completed today  Goals Unmet:  Not Applicable  Comments: Pt able to follow exercise prescription today without complaint.  Will continue to monitor for progression.   Dr. [name] is Medical Director for [place] and [facility].

## 2016-10-17 ENCOUNTER — Encounter: Payer: Self-pay | Admitting: *Deleted

## 2016-10-17 DIAGNOSIS — Z951 Presence of aortocoronary bypass graft: Secondary | ICD-10-CM

## 2016-10-17 NOTE — Progress Notes (Signed)
Cardiac Individual Treatment Plan  Patient Details  Name: Victor Gould MRN: [id number] Date of Birth: [birth year] Referring Provider:     Cardiac Rehab from 10/01/2016 in [facility] Cardiac and Pulmonary Rehab  Referring Provider  Ida Rogue MD      Initial Encounter Date:    Cardiac Rehab from 10/01/2016 in [facility] Cardiac and Pulmonary Rehab  Date  10/01/16  Referring Provider  Ida Rogue MD      Visit Diagnosis: S/P CABG x 5  Patient's Home Medications on Admission:  Current Outpatient Prescriptions:  .  acetaminophen (TYLENOL) 500 MG tablet, Take 1,000 mg by mouth daily as needed for headache (pain)., Disp: , Rfl:  .  amiodarone (PACERONE) 200 MG tablet, Take 1 tablet (200 mg total) by mouth every 12 (twelve) hours. For 2 days then take Amiodarone 200 mg by mouth daily thereafter., Disp: 30 tablet, Rfl: 1 .  aspirin EC 325 MG EC tablet, Take 1 tablet (325 mg total) by mouth daily., Disp: 30 tablet, Rfl: 0 .  atorvastatin (LIPITOR) 40 MG tablet, Take 1 tablet (40 mg total) by mouth daily at 6 PM., Disp: 30 tablet, Rfl: 1 .  b complex vitamins tablet, Take 1 tablet by mouth daily after breakfast., Disp: , Rfl:  .  cholecalciferol (VITAMIN D) 1000 units tablet, Take 1,000 Units by mouth daily after breakfast., Disp: , Rfl:  .  clopidogrel (PLAVIX) 75 MG tablet, Take 1 tablet (75 mg total) by mouth daily., Disp: 90 tablet, Rfl: 3 .  hydrocortisone cream 1 %, Apply 1 application topically daily as needed for itching., Disp: , Rfl:  .  insulin glargine (LANTUS) 100 unit/mL SOPN, Inject 17 Units into the skin daily before breakfast., Disp: , Rfl:  .  insulin lispro (HUMALOG KWIKPEN) 100 UNIT/ML KiwkPen, Inject 7-10 Units into the skin See admin instructions. Inject 7 units subcutaneously before breakfast, lunch and dinner - plus adjustment for CBG 140-180 add 1 unit, 180-220 add 3 units, >220 add 3 units, Disp: , Rfl:  .  levothyroxine (SYNTHROID, LEVOTHROID) 75 MCG tablet, Take 75 mcg  by mouth daily before breakfast., Disp: , Rfl:  .  losartan (COZAAR) 25 MG tablet, Take 1 tablet (25 mg total) by mouth daily after breakfast., Disp: 30 tablet, Rfl: 1 .  metoprolol tartrate (LOPRESSOR) 25 MG tablet, Take 1 tablet (25 mg total) by mouth 2 (two) times daily., Disp: 60 tablet, Rfl: 1 .  Multiple Vitamin (MULTIVITAMIN WITH MINERALS) TABS tablet, Take 1 tablet by mouth daily after breakfast., Disp: , Rfl:  .  omeprazole (PRILOSEC) 20 MG capsule, Take 20 mg by mouth daily before breakfast. , Disp: , Rfl:  .  OVER THE COUNTER MEDICATION, Place 1 drop into both eyes daily as needed (dry eyes). Over the counter lubricating eye drops - must be preservative free, Disp: , Rfl:   Past Medical History: Past Medical History:  Diagnosis Date  . Cancer [facility])    prostate  . Diabetes mellitus   . GERD (gastroesophageal reflux disease)   . Hemorrhoids   . Hypertension    PCP [name] at [place]  . PONV (postoperative nausea and vomiting)   . Thyroid disease     Tobacco Use: History  Smoking Status  . Never Smoker  Smokeless Tobacco  . Never Used    Labs: Recent Review Flowsheet Data    Labs for ITP Cardiac and Pulmonary Rehab Latest Ref Rng & Units 08/28/2016 08/29/2016 08/29/2016 08/29/2016 08/29/2016   Cholestrol 0 -  200 mg/dL - - - - -   LDLCALC 0 - 99 mg/dL - - - - -   HDL >40 mg/dL - - - - -   Trlycerides <150 mg/dL - - - - -   Hemoglobin A1c 4.8 - 5.6 % - - - - -   PHART 7.350 - 7.450 7.361 7.420 7.418 7.391 -   PCO2ART 32.0 - 48.0 mmHg 32.7 30.2(L) 29.9(L) 30.5(L) -   HCO3 20.0 - 28.0 mmol/L 18.5(L) 19.6(L) 19.3(L) 18.5(L) -   TCO2 0 - 100 mmol/L '20 21 20 19 23   ' ACIDBASEDEF 0.0 - 2.0 mmol/L 6.0(H) 4.0(H) 4.0(H) 6.0(H) -   O2SAT % 93.0 97.0 94.0 93.0 -       Exercise Target Goals:    Exercise Program Goal: Individual exercise prescription set with THRR, safety & activity barriers. Participant demonstrates ability to understand and report RPE using BORG scale, to  self-measure pulse accurately, and to acknowledge the importance of the exercise prescription.  Exercise Prescription Goal: Starting with aerobic activity 30 plus minutes a day, 3 days per week for initial exercise prescription. Provide home exercise prescription and guidelines that participant acknowledges understanding prior to discharge.  Activity Barriers & Risk Stratification:     Activity Barriers & Cardiac Risk Stratification - 10/01/16 1320      Activity Barriers & Cardiac Risk Stratification   Activity Barriers Deconditioning;Muscular Weakness;Balance Concerns   Cardiac Risk Stratification High      6 Minute Walk:     6 Minute Walk    Row Name 10/01/16 1417         6 Minute Walk   Phase Initial     Distance 1100 feet     Walk Time 6 minutes     # of Rest Breaks 0     MPH 2.08     METS 2.01     RPE 11     VO2 Peak 7.04     Symptoms No     Resting HR 54 bpm     Resting BP 136/66     Max Ex. HR 68 bpm     Max Ex. BP 142/70     2 Minute Post BP 146/70  recheck 126 74        Oxygen Initial Assessment:   Oxygen Re-Evaluation:   Oxygen Discharge (Final Oxygen Re-Evaluation):   Initial Exercise Prescription:     Initial Exercise Prescription - 10/01/16 1400      Date of Initial Exercise RX and Referring Provider   Date 10/01/16   Referring Provider Ida Rogue MD     Treadmill   MPH 1.3   Grade 0   Minutes 15   METs 2     Recumbant Bike   Level 1   RPM 40   Watts 10   Minutes 15   METs 2.39     NuStep   Level 1   SPM 80   Minutes 15   METs 2     Prescription Details   Frequency (times per week) 2   Duration Progress to 45 minutes of aerobic exercise without signs/symptoms of physical distress     Intensity   THRR 40-80% of Max Heartrate 90-125   Ratings of Perceived Exertion 11-15   Perceived Dyspnea 0-4     Progression   Progression Continue to progress workloads to maintain intensity without signs/symptoms of physical  distress.     Resistance Training   Training Prescription Yes   Weight  3 lbs   Reps 10-15      Perform Capillary Blood Glucose checks as needed.  Exercise Prescription Changes:     Exercise Prescription Changes    Row Name 10/01/16 1300 10/11/16 1100           Response to Exercise   Blood Pressure (Admit) 136/66 126/64      Blood Pressure (Exercise) 148/70 124/64      Blood Pressure (Exit) 146/70 124/64      Heart Rate (Admit) 54 bpm 69 bpm      Heart Rate (Exercise) 68 bpm 91 bpm      Heart Rate (Exit) 57 bpm 58 bpm      Oxygen Saturation (Admit) 100 %  -      Oxygen Saturation (Exercise) 96 %  -      Rating of Perceived Exertion (Exercise) 11 13      Symptoms none none      Comments walk test results only second day of exercise      Duration  - Progress to 45 minutes of aerobic exercise without signs/symptoms of physical distress      Intensity  - THRR unchanged        Progression   Progression  - Continue to progress workloads to maintain intensity without signs/symptoms of physical distress.      Average METs  - 2.16        Resistance Training   Training Prescription  - Yes      Weight  - 3 lbs      Reps  - 10-15        Interval Training   Interval Training  - No        Treadmill   MPH  - 1.3      Grade  - 0      Minutes  - 15      METs  - 2        Recumbant Bike   Level  - 1      Watts  - 13      Minutes  - 15      METs  - 2.39        NuStep   Level  - 1      Minutes  - 15      METs  - 2.1         Exercise Comments:     Exercise Comments    Row Name 10/09/16 1040           Exercise Comments  First full day of exercise!  Patient was oriented to gym and equipment including functions, settings, policies, and procedures.  Patient's individual exercise prescription and treatment plan were reviewed.  All starting workloads were established based on the results of the 6 minute walk test done at initial orientation visit.  The plan for exercise  progression was also introduced and progression will be customized based on patient's performance and goals.          Exercise Goals and Review:     Exercise Goals    Row Name 10/01/16 1420             Exercise Goals   Increase Physical Activity Yes       Intervention Provide advice, education, support and counseling about physical activity/exercise needs.;Develop an individualized exercise prescription for aerobic and resistive training based on initial evaluation findings, risk stratification, comorbidities and participant's personal goals.  Expected Outcomes Achievement of increased cardiorespiratory fitness and enhanced flexibility, muscular endurance and strength shown through measurements of functional capacity and personal statement of participant.       Increase Strength and Stamina Yes       Intervention Provide advice, education, support and counseling about physical activity/exercise needs.;Develop an individualized exercise prescription for aerobic and resistive training based on initial evaluation findings, risk stratification, comorbidities and participant's personal goals.       Expected Outcomes Achievement of increased cardiorespiratory fitness and enhanced flexibility, muscular endurance and strength shown through measurements of functional capacity and personal statement of participant.          Exercise Goals Re-Evaluation :     Exercise Goals Re-Evaluation    Row Name 10/11/16 1125             Exercise Goal Re-Evaluation   Exercise Goals Review Increase Physical Activity;Increase Strenth and Stamina       Comments Victor Gould has completed his first full week of exercise!!  He is off to a good start and we will continue to monitor his progression.       Expected Outcomes Short and Long: Continue to come to class to work on increasing phsyicial activity.          Discharge Exercise Prescription (Final Exercise Prescription Changes):     Exercise  Prescription Changes - 10/11/16 1100      Response to Exercise   Blood Pressure (Admit) 126/64   Blood Pressure (Exercise) 124/64   Blood Pressure (Exit) 124/64   Heart Rate (Admit) 69 bpm   Heart Rate (Exercise) 91 bpm   Heart Rate (Exit) 58 bpm   Rating of Perceived Exertion (Exercise) 13   Symptoms none   Comments only second day of exercise   Duration Progress to 45 minutes of aerobic exercise without signs/symptoms of physical distress   Intensity THRR unchanged     Progression   Progression Continue to progress workloads to maintain intensity without signs/symptoms of physical distress.   Average METs 2.16     Resistance Training   Training Prescription Yes   Weight 3 lbs   Reps 10-15     Interval Training   Interval Training No     Treadmill   MPH 1.3   Grade 0   Minutes 15   METs 2     Recumbant Bike   Level 1   Watts 13   Minutes 15   METs 2.39     NuStep   Level 1   Minutes 15   METs 2.1      Nutrition:  Target Goals: Understanding of nutrition guidelines, daily intake of sodium 1500mg , cholesterol 200mg , calories 30% from fat and 7% or less from saturated fats, daily to have 5 or more servings of fruits and vegetables.  Biometrics:     Pre Biometrics - 10/01/16 1420      Pre Biometrics   Height 5' 8.3" (1.735 m)   Weight 174 lb (78.9 kg)   Waist Circumference 35 inches   Hip Circumference 38 inches   Waist to Hip Ratio 0.92 %   BMI (Calculated) 26.3   Single Leg Stand 1.81 seconds       Nutrition Therapy Plan and Nutrition Goals:     Nutrition Therapy & Goals - 10/01/16 1320      Intervention Plan   Intervention Prescribe, educate and counsel regarding individualized specific dietary modifications aiming towards targeted core components such as weight, hypertension, lipid  management, diabetes, heart failure and other comorbidities.   Expected Outcomes Short Term Goal: Understand basic principles of dietary content, such as  calories, fat, sodium, cholesterol and nutrients.;Short Term Goal: A plan has been developed with personal nutrition goals set during dietitian appointment.;Long Term Goal: Adherence to prescribed nutrition plan.      Nutrition Discharge: Rate Your Plate Scores:     Nutrition Assessments - 10/01/16 1321      MEDFICTS Scores   Pre Score 41      Nutrition Goals Re-Evaluation:   Nutrition Goals Discharge (Final Nutrition Goals Re-Evaluation):   Psychosocial: Target Goals: Acknowledge presence or absence of significant depression and/or stress, maximize coping skills, provide positive support system. Participant is able to verbalize types and ability to use techniques and skills needed for reducing stress and depression.   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      Quality of Life Scores:      Quality of Life - 10/01/16 1327      Quality of Life Scores   Health/Function Pre 25.29 %   Socioeconomic Pre 30 %   Psych/Spiritual Pre 29.14 %   Family Pre 30 %   GLOBAL Pre 27.75 %      PHQ-9: Recent Review Flowsheet Data    Depression screen [facility] 2/9 10/01/2016   Decreased Interest 0   Down, Depressed, Hopeless 0   PHQ - 2 Score 0   Altered sleeping 3   Tired, decreased energy 3   Change in appetite 0   Feeling bad or failure about yourself  0   Trouble concentrating 0   Moving slowly or fidgety/restless 0   Suicidal thoughts 0   PHQ-9 Score 6   Difficult doing work/chores Somewhat difficult     Interpretation of Total Score  Total Score Depression Severity:  1-4 = Minimal depression, 5-9 = Mild depression, 10-14 = Moderate depression, 15-19 = Moderately severe depression, 20-27 = Severe depression   Psychosocial Evaluation and Intervention:   Psychosocial Re-Evaluation:   Psychosocial Discharge (Final  Psychosocial Re-Evaluation):   Vocational Rehabilitation: Provide vocational rehab assistance to qualifying candidates.   Vocational Rehab Evaluation & Intervention:     Vocational Rehab - 10/01/16 1333      Initial Vocational Rehab Evaluation & Intervention   Assessment shows need for Vocational Rehabilitation No      Education: Education Goals: Education classes will be provided on a weekly basis, covering required topics. Participant will state understanding/return demonstration of topics presented.  Learning Barriers/Preferences:     Learning Barriers/Preferences - 10/01/16 1333      Learning Barriers/Preferences   Learning Barriers Hearing   Learning Preferences None      Education Topics: General Nutrition Guidelines/Fats and Fiber: -Group instruction provided by verbal, written material, models and posters to present the general guidelines for heart healthy nutrition. Gives an explanation and review of dietary fats and fiber.   Controlling Sodium/Reading Food Labels: -Group verbal and written material supporting the discussion of sodium use in heart healthy nutrition. Review and explanation with models, verbal and written materials for utilization of the food label.   Exercise Physiology & Risk Factors: - Group verbal and written instruction with models to review the exercise physiology of the cardiovascular system and associated critical values. Details cardiovascular disease risk factors  and the goals associated with each risk factor.   Aerobic Exercise & Resistance Training: - Gives group verbal and written discussion on the health impact of inactivity. On the components of aerobic and resistive training programs and the benefits of this training and how to safely progress through these programs.   Flexibility, Balance, General Exercise Guidelines: - Provides group verbal and written instruction on the benefits of flexibility and balance training programs.  Provides general exercise guidelines with specific guidelines to those with heart or lung disease. Demonstration and skill practice provided.   Stress Management: - Provides group verbal and written instruction about the health risks of elevated stress, cause of high stress, and healthy ways to reduce stress.   Depression: - Provides group verbal and written instruction on the correlation between heart/lung disease and depressed mood, treatment options, and the stigmas associated with seeking treatment.   Anatomy & Physiology of the Heart: - Group verbal and written instruction and models provide basic cardiac anatomy and physiology, with the coronary electrical and arterial systems. Review of: AMI, Angina, Valve disease, Heart Failure, Cardiac Arrhythmia, Pacemakers, and the ICD.   Cardiac Procedures: - Group verbal and written instruction and models to describe the testing methods done to diagnose heart disease. Reviews the outcomes of the test results. Describes the treatment choices: Medical Management, Angioplasty, or Coronary Bypass Surgery.   Cardiac Medications: - Group verbal and written instruction to review commonly prescribed medications for heart disease. Reviews the medication, class of the drug, and side effects. Includes the steps to properly store meds and maintain the prescription regimen.   Cardiac Rehab from 10/16/2016 in [facility] Cardiac and Pulmonary Rehab  Date  10/09/16 [6/12 Part One  6/14 Part Two]  Educator  SB  Instruction Review Code  2- meets goals/outcomes      Go Sex-Intimacy & Heart Disease, Get SMART - Goal Setting: - Group verbal and written instruction through game format to discuss heart disease and the return to sexual intimacy. Provides group verbal and written material to discuss and apply goal setting through the application of the S.M.A.R.T. Method.   Other Matters of the Heart: - Provides group verbal, written materials and models to describe  Heart Failure, Angina, Valve Disease, and Diabetes in the realm of heart disease. Includes description of the disease process and treatment options available to the cardiac patient.   Exercise & Equipment Safety: - Individual verbal instruction and demonstration of equipment use and safety with use of the equipment.   Cardiac Rehab from 10/16/2016 in [facility] Cardiac and Pulmonary Rehab  Date  10/01/16  Educator  Sb  Instruction Review Code  2- meets goals/outcomes      Infection Prevention: - Provides verbal and written material to individual with discussion of infection control including proper hand washing and proper equipment cleaning during exercise session.   Cardiac Rehab from 10/16/2016 in [facility] Cardiac and Pulmonary Rehab  Date  10/01/16  Educator  Sb  Instruction Review Code  2- meets goals/outcomes      Falls Prevention: - Provides verbal and written material to individual with discussion of falls prevention and safety.   Cardiac Rehab from 10/16/2016 in [facility] Cardiac and Pulmonary Rehab  Date  10/01/16  Educator  SB  Instruction Review Code  2- meets goals/outcomes      Diabetes: - Individual verbal and written instruction to review signs/symptoms of diabetes, desired ranges of glucose level fasting, after meals and with exercise. Advice that pre and post exercise glucose  checks will be done for 3 sessions at entry of program.   Cardiac Rehab from 10/16/2016 in [facility] Cardiac and Pulmonary Rehab  Date  10/01/16  Educator  SB  Instruction Review Code  2- meets goals/outcomes       Knowledge Questionnaire Score:     Knowledge Questionnaire Score - 10/01/16 1333      Knowledge Questionnaire Score   Pre Score 18/28      Core Components/Risk Factors/Patient Goals at Admission:     Personal Goals and Risk Factors at Admission - 10/01/16 1324      Core Components/Risk Factors/Patient Goals on Admission    Weight Management Yes;Weight Maintenance   Intervention Weight  Management: Develop a combined nutrition and exercise program designed to reach desired caloric intake, while maintaining appropriate intake of nutrient and fiber, sodium and fats, and appropriate energy expenditure required for the weight goal.   Admit Weight 174 lb (78.9 kg)   Goal Weight: Short Term 172 lb (78 kg)   Goal Weight: Long Term 170 lb (77.1 kg)   Expected Outcomes Short Term: Continue to assess and modify interventions until short term weight is achieved;Long Term: Adherence to nutrition and physical activity/exercise program aimed toward attainment of established weight goal;Weight Maintenance: Understanding of the daily nutrition guidelines, which includes 25-35% calories from fat, 7% or less cal from saturated fats, less than 200mg  cholesterol, less than 1.5gm of sodium, & 5 or more servings of fruits and vegetables daily   Diabetes Yes   Intervention Provide education about signs/symptoms and action to take for hypo/hyperglycemia.;Provide education about proper nutrition, including hydration, and aerobic/resistive exercise prescription along with prescribed medications to achieve blood glucose in normal ranges: Fasting glucose 65-99 mg/dL   Expected Outcomes Short Term: Participant verbalizes understanding of the signs/symptoms and immediate care of hyper/hypoglycemia, proper foot care and importance of medication, aerobic/resistive exercise and nutrition plan for blood glucose control.;Long Term: Attainment of HbA1C < 7%.  LAst A1C was 7.7%   Hypertension Yes   Intervention Provide education on lifestyle modifcations including regular physical activity/exercise, weight management, moderate sodium restriction and increased consumption of fresh fruit, vegetables, and low fat dairy, alcohol moderation, and smoking cessation.;Monitor prescription use compliance.   Expected Outcomes Short Term: Continued assessment and intervention until BP is < 140/78mm HG in hypertensive participants. <  130/50mm HG in hypertensive participants with diabetes, heart failure or chronic kidney disease.;Long Term: Maintenance of blood pressure at goal levels.   Lipids Yes   Intervention Provide education and support for participant on nutrition & aerobic/resistive exercise along with prescribed medications to achieve LDL 70mg , HDL >40mg .  Medication started recently   Expected Outcomes Short Term: Participant states understanding of desired cholesterol values and is compliant with medications prescribed. Participant is following exercise prescription and nutrition guidelines.;Long Term: Cholesterol controlled with medications as prescribed, with individualized exercise RX and with personalized nutrition plan. Value goals: LDL < 70mg , HDL > 40 mg.      Core Components/Risk Factors/Patient Goals Review:    Core Components/Risk Factors/Patient Goals at Discharge (Final Review):    ITP Comments:     ITP Comments    Row Name 10/01/16 1316 10/17/16 0544         ITP Comments Medical Review Completed today. Initial ITP created. Diaagnosis documnetation can be found in [facility] 08/25/2016 Admission 30 day review. Continue with ITP unless directed changes per Medical Director review.  New to program         Comments:

## 2016-10-18 DIAGNOSIS — Z951 Presence of aortocoronary bypass graft: Secondary | ICD-10-CM

## 2016-10-18 LAB — GLUCOSE, CAPILLARY: GLUCOSE-CAPILLARY: 135 mg/dL — AB (ref 65–99)

## 2016-10-18 NOTE — Progress Notes (Signed)
Daily Session Note  Patient Details  Name: Victor Gould MRN: [id number] Date of Birth: 09-Nov-1940 Referring Provider:     Cardiac Rehab from 10/01/2016 in [facility] Cardiac and Pulmonary Rehab  Referring Provider  Ida Rogue MD      Encounter Date: 10/18/2016  Check In:     Session Check In - 10/18/16 0949      Check-In   Location [facility]-Cardiac & Pulmonary Rehab   Staff Present Alberteen Sam, MA, ACSM RCEP, Exercise Physiologist;Amanda Oletta Darter, BA, ACSM CEP, Exercise Physiologist;Meredith Sherryll Burger, RN BSN;Zackery Brine Flavia Shipper   Supervising physician immediately available to respond to emergencies See telemetry face sheet for immediately available ER MD   Medication changes reported     No   Fall or balance concerns reported    No   Warm-up and Cool-down Performed on first and last piece of equipment   Resistance Training Performed Yes   VAD Patient? No     Pain Assessment   Currently in Pain? No/denies   Multiple Pain Sites No         History  Smoking Status  . Never Smoker  Smokeless Tobacco  . Never Used    Goals Met:  Independence with exercise equipment Exercise tolerated well No report of cardiac concerns or symptoms Strength training completed today  Goals Unmet:  Not Applicable  Comments: Pt able to follow exercise prescription today without complaint.  Will continue to monitor for progression.   Dr. [name] is Medical Director for [place] and [facility].

## 2016-10-22 ENCOUNTER — Encounter: Payer: Medicare Other | Admitting: *Deleted

## 2016-10-22 DIAGNOSIS — Z951 Presence of aortocoronary bypass graft: Secondary | ICD-10-CM | POA: Diagnosis not present

## 2016-10-22 NOTE — Progress Notes (Signed)
Daily Session Note  Patient Details  Name: Victor Gould MRN: [id number] Date of Birth: 03/16/1941 Referring Provider:     Cardiac Rehab from 10/01/2016 in [facility] Cardiac and Pulmonary Rehab  Referring Provider  Ida Rogue MD      Encounter Date: 10/22/2016  Check In:     Session Check In - 10/22/16 0908      Check-In   Location [facility]-Cardiac & Pulmonary Rehab   Staff Present Gerlene Burdock, RN, Levie Heritage, MA, ACSM RCEP, Exercise Physiologist;Kelly Amedeo Plenty, BS, ACSM CEP, Exercise Physiologist   Supervising physician immediately available to respond to emergencies See telemetry face sheet for immediately available ER MD   Medication changes reported     No   Fall or balance concerns reported    No   Warm-up and Cool-down Performed on first and last piece of equipment   Resistance Training Performed Yes   VAD Patient? No     Pain Assessment   Currently in Pain? No/denies   Multiple Pain Sites No         History  Smoking Status  . Never Smoker  Smokeless Tobacco  . Never Used    Goals Met:  Independence with exercise equipment Exercise tolerated well No report of cardiac concerns or symptoms Strength training completed today  Goals Unmet:  Not Applicable  Comments: Pt able to follow exercise prescription today without complaint.  Will continue to monitor for progression.    Dr. [name] is Medical Director for [place] and [facility].

## 2016-10-25 ENCOUNTER — Encounter: Payer: Medicare Other | Admitting: *Deleted

## 2016-10-25 DIAGNOSIS — Z951 Presence of aortocoronary bypass graft: Secondary | ICD-10-CM | POA: Diagnosis not present

## 2016-10-25 NOTE — Progress Notes (Signed)
Daily Session Note  Patient Details  Name: Victor Gould MRN: [id number] Date of Birth: 1941/03/22 Referring Provider:     Cardiac Rehab from 10/01/2016 in [facility] Cardiac and Pulmonary Rehab  Referring Provider  Ida Rogue MD      Encounter Date: 10/25/2016  Check In:     Session Check In - 10/25/16 0920      Check-In   Location [facility]-Cardiac & Pulmonary Rehab   Staff Present Nyoka Cowden, RN, BSN, MA;Meredith Sherryll Burger, RN BSN;Jessica Luan Pulling, MA, ACSM RCEP, Exercise Physiologist   Supervising physician immediately available to respond to emergencies See telemetry face sheet for immediately available ER MD   Medication changes reported     No   Fall or balance concerns reported    No   Warm-up and Cool-down Performed on first and last piece of equipment   Resistance Training Performed Yes   VAD Patient? No     Pain Assessment   Currently in Pain? No/denies         History  Smoking Status  . Never Smoker  Smokeless Tobacco  . Never Used    Goals Met:  Proper associated with RPD/PD & O2 Sat Independence with exercise equipment Exercise tolerated well No report of cardiac concerns or symptoms Strength training completed today  Goals Unmet:  Not Applicable  Comments: Pt able to follow exercise prescription today without complaint.  Will continue to monitor for progression. Reviewed home exercise with pt today.  Pt plans to walk and use recumbent bike at home for exercise.  Reviewed THR, pulse, RPE, sign and symptoms, and when to call 911 or MD.  Also discussed weather considerations and indoor options.  Pt voiced understanding.    Dr. [name] is Medical Director for [place] and [facility].

## 2016-10-30 ENCOUNTER — Encounter: Payer: Medicare Other | Attending: Interventional Cardiology | Admitting: *Deleted

## 2016-10-30 DIAGNOSIS — Z951 Presence of aortocoronary bypass graft: Secondary | ICD-10-CM | POA: Diagnosis not present

## 2016-10-30 NOTE — Progress Notes (Signed)
Daily Session Note  Patient Details  Name: Victor Gould MRN: [id number] Date of Birth: [birth year] Referring Provider:     Cardiac Rehab from 10/01/2016 in [facility] Cardiac and Pulmonary Rehab  Referring Provider  Ida Rogue MD      Encounter Date: 10/30/2016  Check In:     Session Check In - 10/30/16 0823      Check-In   Location [facility]-Cardiac & Pulmonary Rehab   Staff Present Heath Lark, RN, BSN, CCRP;Jessica Luan Pulling, MA, ACSM RCEP, Exercise Physiologist;Judeen Geralds Oletta Darter, BA, ACSM CEP, Exercise Physiologist   Supervising physician immediately available to respond to emergencies See telemetry face sheet for immediately available ER MD   Medication changes reported     No   Fall or balance concerns reported    No   Warm-up and Cool-down Performed on first and last piece of equipment   Resistance Training Performed Yes   VAD Patient? No     Pain Assessment   Currently in Pain? No/denies   Multiple Pain Sites No         History  Smoking Status  . Never Smoker  Smokeless Tobacco  . Never Used    Goals Met:  Independence with exercise equipment Exercise tolerated well No report of cardiac concerns or symptoms Strength training completed today  Goals Unmet:  Not Applicable  Comments: Pt able to follow exercise prescription today without complaint.  Will continue to monitor for progression.    Dr. [name] is Medical Director for [place] and [facility].

## 2016-11-01 ENCOUNTER — Encounter: Payer: Medicare Other | Admitting: *Deleted

## 2016-11-01 DIAGNOSIS — Z951 Presence of aortocoronary bypass graft: Secondary | ICD-10-CM | POA: Diagnosis not present

## 2016-11-01 NOTE — Progress Notes (Signed)
Daily Session Note  Patient Details  Name: Victor Gould MRN: [id number] Date of Birth: 14-Jun-1940 Referring Provider:     Cardiac Rehab from 10/01/2016 in [facility] Cardiac and Pulmonary Rehab  Referring Provider  Ida Rogue MD      Encounter Date: 11/01/2016  Check In:     Session Check In - 11/01/16 0923      Check-In   Location [facility]-Cardiac & Pulmonary Rehab   Staff Present Alberteen Sam, MA, ACSM RCEP, Exercise Physiologist;Amanda Oletta Darter, BA, ACSM CEP, Exercise Physiologist;Meredith Sherryll Burger, RN BSN   Supervising physician immediately available to respond to emergencies See telemetry face sheet for immediately available ER MD   Medication changes reported     No   Fall or balance concerns reported    No   Warm-up and Cool-down Performed on first and last piece of equipment   Resistance Training Performed Yes   VAD Patient? No     Pain Assessment   Currently in Pain? No/denies   Multiple Pain Sites No         History  Smoking Status  . Never Smoker  Smokeless Tobacco  . Never Used    Goals Met:  Independence with exercise equipment Exercise tolerated well No report of cardiac concerns or symptoms Strength training completed today  Goals Unmet:  Not Applicable  Comments: Pt able to follow exercise prescription today without complaint.  Will continue to monitor for progression.    Dr. [name] is Medical Director for [place] and [facility].

## 2016-11-06 DIAGNOSIS — Z951 Presence of aortocoronary bypass graft: Secondary | ICD-10-CM | POA: Diagnosis not present

## 2016-11-06 NOTE — Progress Notes (Signed)
Daily Session Note  Patient Details  Name: Victor Gould MRN: [id number] Date of Birth: [birth year] Referring Provider:     Cardiac Rehab from 10/01/2016 in [facility] Cardiac and Pulmonary Rehab  Referring Provider  Ida Rogue MD      Encounter Date: 11/06/2016  Check In:     Session Check In - 11/06/16 0933      Check-In   Location [facility]-Cardiac & Pulmonary Rehab   Staff Present Heath Lark, RN, BSN, Lance Sell, BA, ACSM CEP, Exercise Physiologist;Krista Frederico Hamman, RN BSN   Supervising physician immediately available to respond to emergencies See telemetry face sheet for immediately available ER MD   Medication changes reported     No   Fall or balance concerns reported    No   Warm-up and Cool-down Performed on first and last piece of equipment   Resistance Training Performed Yes   VAD Patient? No     Pain Assessment   Currently in Pain? No/denies         History  Smoking Status  . Never Smoker  Smokeless Tobacco  . Never Used    Goals Met:  Independence with exercise equipment Exercise tolerated well No report of cardiac concerns or symptoms Strength training completed today  Goals Unmet:  Not Applicable  Comments: Pt able to follow exercise prescription today without complaint.  Will continue to monitor for progression.     Dr. [name] is Medical Director for [name] and [facility].

## 2016-11-08 ENCOUNTER — Encounter: Payer: Medicare Other | Admitting: *Deleted

## 2016-11-08 DIAGNOSIS — Z951 Presence of aortocoronary bypass graft: Secondary | ICD-10-CM

## 2016-11-08 NOTE — Progress Notes (Signed)
Daily Session Note  Patient Details  Name: Victor Gould MRN: [id number] Date of Birth: 04-15-1941 Referring Provider:     Cardiac Rehab from 10/01/2016 in [facility] Cardiac and Pulmonary Rehab  Referring Provider  Ida Rogue MD      Encounter Date: 11/08/2016  Check In:     Session Check In - 11/08/16 0830      Check-In   Location [facility]-Cardiac & Pulmonary Rehab   Staff Present Nyoka Cowden, RN, BSN, MA;Caylee Vlachos Sherryll Burger, RN Vickki Hearing, BA, ACSM CEP, Exercise Physiologist   Supervising physician immediately available to respond to emergencies See telemetry face sheet for immediately available ER MD   Medication changes reported     No   Fall or balance concerns reported    No   Warm-up and Cool-down Performed on first and last piece of equipment   Resistance Training Performed Yes   VAD Patient? No     Pain Assessment   Currently in Pain? No/denies           Exercise Prescription Changes - 11/07/16 1500      Response to Exercise   Blood Pressure (Admit) 152/72   Blood Pressure (Exercise) 134/72   Blood Pressure (Exit) 132/66   Heart Rate (Admit) 67 bpm   Heart Rate (Exercise) 88 bpm   Heart Rate (Exit) 59 bpm   Rating of Perceived Exertion (Exercise) 11   Symptoms none   Duration Continue with 45 min of aerobic exercise without signs/symptoms of physical distress.   Intensity THRR unchanged     Progression   Progression Continue to progress workloads to maintain intensity without signs/symptoms of physical distress.   Average METs 2.2     Resistance Training   Training Prescription Yes   Weight 3 lb   Reps 10-15     Interval Training   Interval Training No     Treadmill   MPH 1.3   Grade 0   Minutes 15   METs 2     Recumbant Bike   Level 3   Watts 10   Minutes 15   METs 2.39      History  Smoking Status  . Never Smoker  Smokeless Tobacco  . Never Used    Goals Met:  Proper associated with RPD/PD & O2 Sat Independence with  exercise equipment Exercise tolerated well No report of cardiac concerns or symptoms Strength training completed today  Goals Unmet:  Not Applicable  Comments: Pt able to follow exercise prescription today without complaint.  Will continue to monitor for progression.    Dr. [name] is Medical Director for [place] and [facility].

## 2016-11-13 ENCOUNTER — Encounter: Payer: Medicare Other | Admitting: *Deleted

## 2016-11-13 DIAGNOSIS — Z951 Presence of aortocoronary bypass graft: Secondary | ICD-10-CM | POA: Diagnosis not present

## 2016-11-13 NOTE — Progress Notes (Signed)
Daily Session Note  Patient Details  Name: Victor Gould MRN: [id number] Date of Birth: Mar 19, 1941 Referring Provider:     Cardiac Rehab from 10/01/2016 in [facility] Cardiac and Pulmonary Rehab  Referring Provider  Ida Rogue MD      Encounter Date: 11/13/2016  Check In:     Session Check In - 11/13/16 0829      Check-In   Location [facility]-Cardiac & Pulmonary Rehab   Staff Present Heath Lark, RN, BSN, CCRP;Jessica Luan Pulling, MA, ACSM RCEP, Exercise Physiologist;Sulo Janczak Oletta Darter, BA, ACSM CEP, Exercise Physiologist   Supervising physician immediately available to respond to emergencies See telemetry face sheet for immediately available ER MD   Medication changes reported     No   Fall or balance concerns reported    No   Warm-up and Cool-down Performed on first and last piece of equipment   Resistance Training Performed Yes   VAD Patient? No     Pain Assessment   Currently in Pain? No/denies   Multiple Pain Sites No         History  Smoking Status  . Never Smoker  Smokeless Tobacco  . Never Used    Goals Met:  Independence with exercise equipment Exercise tolerated well No report of cardiac concerns or symptoms Strength training completed today  Goals Unmet:  Not Applicable  Comments: Pt able to follow exercise prescription today without complaint.  Will continue to monitor for progression.    Dr. [name] is Medical Director for [place] and [facility].

## 2016-11-14 ENCOUNTER — Encounter: Payer: Self-pay | Admitting: *Deleted

## 2016-11-14 DIAGNOSIS — Z951 Presence of aortocoronary bypass graft: Secondary | ICD-10-CM

## 2016-11-14 NOTE — Progress Notes (Signed)
Cardiac Individual Treatment Plan  Patient Details  Name: Victor Gould MRN: [id number] Date of Birth: [birth year] Referring Provider:     Cardiac Rehab from 10/01/2016 in [facility] Cardiac and Pulmonary Rehab  Referring Provider  Ida Rogue MD      Initial Encounter Date:    Cardiac Rehab from 10/01/2016 in [facility] Cardiac and Pulmonary Rehab  Date  10/01/16  Referring Provider  Ida Rogue MD      Visit Diagnosis: S/P CABG x 5  Patient's Home Medications on Admission:  Current Outpatient Prescriptions:  .  acetaminophen (TYLENOL) 500 MG tablet, Take 1,000 mg by mouth daily as needed for headache (pain)., Disp: , Rfl:  .  amiodarone (PACERONE) 200 MG tablet, Take 1 tablet (200 mg total) by mouth every 12 (twelve) hours. For 2 days then take Amiodarone 200 mg by mouth daily thereafter., Disp: 30 tablet, Rfl: 1 .  aspirin EC 325 MG EC tablet, Take 1 tablet (325 mg total) by mouth daily., Disp: 30 tablet, Rfl: 0 .  atorvastatin (LIPITOR) 40 MG tablet, Take 1 tablet (40 mg total) by mouth daily at 6 PM., Disp: 30 tablet, Rfl: 1 .  b complex vitamins tablet, Take 1 tablet by mouth daily after breakfast., Disp: , Rfl:  .  cholecalciferol (VITAMIN D) 1000 units tablet, Take 1,000 Units by mouth daily after breakfast., Disp: , Rfl:  .  clopidogrel (PLAVIX) 75 MG tablet, Take 1 tablet (75 mg total) by mouth daily., Disp: 90 tablet, Rfl: 3 .  hydrocortisone cream 1 %, Apply 1 application topically daily as needed for itching., Disp: , Rfl:  .  insulin glargine (LANTUS) 100 unit/mL SOPN, Inject 17 Units into the skin daily before breakfast., Disp: , Rfl:  .  insulin lispro (HUMALOG KWIKPEN) 100 UNIT/ML KiwkPen, Inject 7-10 Units into the skin See admin instructions. Inject 7 units subcutaneously before breakfast, lunch and dinner - plus adjustment for CBG 140-180 add 1 unit, 180-220 add 3 units, >220 add 3 units, Disp: , Rfl:  .  levothyroxine (SYNTHROID, LEVOTHROID) 75 MCG tablet, Take 75 mcg  by mouth daily before breakfast., Disp: , Rfl:  .  losartan (COZAAR) 25 MG tablet, Take 1 tablet (25 mg total) by mouth daily after breakfast., Disp: 30 tablet, Rfl: 1 .  metoprolol tartrate (LOPRESSOR) 25 MG tablet, Take 1 tablet (25 mg total) by mouth 2 (two) times daily., Disp: 60 tablet, Rfl: 1 .  Multiple Vitamin (MULTIVITAMIN WITH MINERALS) TABS tablet, Take 1 tablet by mouth daily after breakfast., Disp: , Rfl:  .  omeprazole (PRILOSEC) 20 MG capsule, Take 20 mg by mouth daily before breakfast. , Disp: , Rfl:  .  OVER THE COUNTER MEDICATION, Place 1 drop into both eyes daily as needed (dry eyes). Over the counter lubricating eye drops - must be preservative free, Disp: , Rfl:   Past Medical History: Past Medical History:  Diagnosis Date  . Cancer [facility])    prostate  . Diabetes mellitus   . GERD (gastroesophageal reflux disease)   . Hemorrhoids   . Hypertension    PCP [name] at [place]  . PONV (postoperative nausea and vomiting)   . Thyroid disease     Tobacco Use: History  Smoking Status  . Never Smoker  Smokeless Tobacco  . Never Used    Labs: Recent Review Flowsheet Data    Labs for ITP Cardiac and Pulmonary Rehab Latest Ref Rng & Units 08/28/2016 08/29/2016 08/29/2016 08/29/2016 08/29/2016   Cholestrol 0 -  200 mg/dL - - - - -   LDLCALC 0 - 99 mg/dL - - - - -   HDL >40 mg/dL - - - - -   Trlycerides <150 mg/dL - - - - -   Hemoglobin A1c 4.8 - 5.6 % - - - - -   PHART 7.350 - 7.450 7.361 7.420 7.418 7.391 -   PCO2ART 32.0 - 48.0 mmHg 32.7 30.2(L) 29.9(L) 30.5(L) -   HCO3 20.0 - 28.0 mmol/L 18.5(L) 19.6(L) 19.3(L) 18.5(L) -   TCO2 0 - 100 mmol/L '20 21 20 19 23   ' ACIDBASEDEF 0.0 - 2.0 mmol/L 6.0(H) 4.0(H) 4.0(H) 6.0(H) -   O2SAT % 93.0 97.0 94.0 93.0 -       Exercise Target Goals:    Exercise Program Goal: Individual exercise prescription set with THRR, safety & activity barriers. Participant demonstrates ability to understand and report RPE using BORG scale, to  self-measure pulse accurately, and to acknowledge the importance of the exercise prescription.  Exercise Prescription Goal: Starting with aerobic activity 30 plus minutes a day, 3 days per week for initial exercise prescription. Provide home exercise prescription and guidelines that participant acknowledges understanding prior to discharge.  Activity Barriers & Risk Stratification:     Activity Barriers & Cardiac Risk Stratification - 10/01/16 1320      Activity Barriers & Cardiac Risk Stratification   Activity Barriers Deconditioning;Muscular Weakness;Balance Concerns   Cardiac Risk Stratification High      6 Minute Walk:     6 Minute Walk    Row Name 10/01/16 1417         6 Minute Walk   Phase Initial     Distance 1100 feet     Walk Time 6 minutes     # of Rest Breaks 0     MPH 2.08     METS 2.01     RPE 11     VO2 Peak 7.04     Symptoms No     Resting HR 54 bpm     Resting BP 136/66     Max Ex. HR 68 bpm     Max Ex. BP 142/70     2 Minute Post BP 146/70  recheck 126 74        Oxygen Initial Assessment:   Oxygen Re-Evaluation:   Oxygen Discharge (Final Oxygen Re-Evaluation):   Initial Exercise Prescription:     Initial Exercise Prescription - 10/01/16 1400      Date of Initial Exercise RX and Referring Provider   Date 10/01/16   Referring Provider Ida Rogue MD     Treadmill   MPH 1.3   Grade 0   Minutes 15   METs 2     Recumbant Bike   Level 1   RPM 40   Watts 10   Minutes 15   METs 2.39     NuStep   Level 1   SPM 80   Minutes 15   METs 2     Prescription Details   Frequency (times per week) 2   Duration Progress to 45 minutes of aerobic exercise without signs/symptoms of physical distress     Intensity   THRR 40-80% of Max Heartrate 90-125   Ratings of Perceived Exertion 11-15   Perceived Dyspnea 0-4     Progression   Progression Continue to progress workloads to maintain intensity without signs/symptoms of physical  distress.     Resistance Training   Training Prescription Yes   Weight  3 lbs   Reps 10-15      Perform Capillary Blood Glucose checks as needed.  Exercise Prescription Changes:     Exercise Prescription Changes    Row Name 10/01/16 1300 10/11/16 1100 10/23/16 1400 11/07/16 1500       Response to Exercise   Blood Pressure (Admit) 136/66 126/64 126/64 152/72    Blood Pressure (Exercise) 148/70 124/64 126/60 134/72    Blood Pressure (Exit) 146/70 124/64 138/66 132/66    Heart Rate (Admit) 54 bpm 69 bpm 82 bpm 67 bpm    Heart Rate (Exercise) 68 bpm 91 bpm 107 bpm 88 bpm    Heart Rate (Exit) 57 bpm 58 bpm 56 bpm 59 bpm    Oxygen Saturation (Admit) 100 %  -  -  -    Oxygen Saturation (Exercise) 96 %  -  -  -    Rating of Perceived Exertion (Exercise) '11 13 12 11    ' Symptoms none none none none    Comments walk test results only second day of exercise  -  -    Duration  - Progress to 45 minutes of aerobic exercise without signs/symptoms of physical distress Continue with 45 min of aerobic exercise without signs/symptoms of physical distress. Continue with 45 min of aerobic exercise without signs/symptoms of physical distress.    Intensity  - THRR unchanged THRR unchanged THRR unchanged      Progression   Progression  - Continue to progress workloads to maintain intensity without signs/symptoms of physical distress. Continue to progress workloads to maintain intensity without signs/symptoms of physical distress. Continue to progress workloads to maintain intensity without signs/symptoms of physical distress.    Average METs  - 2.16 2.29 2.2      Resistance Training   Training Prescription  - Yes Yes Yes    Weight  - 3 lbs 3 lbs 3 lb    Reps  - 10-15 10-15 10-15      Interval Training   Interval Training  - No No No      Treadmill   MPH  - 1.3 1.3 1.3    Grade  - 0 0 0    Minutes  - '15 15 15    ' METs  - '2 2 2      ' Recumbant Bike   Level  - '1 3 3    ' Watts  - '13 10 10     ' Minutes  - '15 15 15    ' METs  - 2.39 2.39 2.39      NuStep   Level  - 1 1  -    Minutes  - 15 15  -    METs  - 2.1 2.5  -       Exercise Comments:     Exercise Comments    Row Name 10/09/16 1040           Exercise Comments  First full day of exercise!  Patient was oriented to gym and equipment including functions, settings, policies, and procedures.  Patient's individual exercise prescription and treatment plan were reviewed.  All starting workloads were established based on the results of the 6 minute walk test done at initial orientation visit.  The plan for exercise progression was also introduced and progression will be customized based on patient's performance and goals.          Exercise Goals and Review:     Exercise Goals    Row Name 10/01/16 1420  Exercise Goals   Increase Physical Activity Yes       Intervention Provide advice, education, support and counseling about physical activity/exercise needs.;Develop an individualized exercise prescription for aerobic and resistive training based on initial evaluation findings, risk stratification, comorbidities and participant's personal goals.       Expected Outcomes Achievement of increased cardiorespiratory fitness and enhanced flexibility, muscular endurance and strength shown through measurements of functional capacity and personal statement of participant.       Increase Strength and Stamina Yes       Intervention Provide advice, education, support and counseling about physical activity/exercise needs.;Develop an individualized exercise prescription for aerobic and resistive training based on initial evaluation findings, risk stratification, comorbidities and participant's personal goals.       Expected Outcomes Achievement of increased cardiorespiratory fitness and enhanced flexibility, muscular endurance and strength shown through measurements of functional capacity and personal statement of participant.           Exercise Goals Re-Evaluation :     Exercise Goals Re-Evaluation    Row Name 10/11/16 1125 10/23/16 1428 10/25/16 1013 11/07/16 1518       Exercise Goal Re-Evaluation   Exercise Goals Review Increase Physical Activity;Increase Strenth and Stamina Increase Physical Activity;Increase Strenth and Stamina Increase Physical Activity;Increase Strenth and Stamina Increase Physical Activity;Increase Strenth and Stamina    Comments Victor Gould has completed his first full week of exercise!!  He is off to a good start and we will continue to monitor his progression. Victor Gould has been doing well in rehab.  He is up to 10 Watts on the recumbent bike.  We will continue to monitor his progression. Victor Gould has been doing about 10-9mn of walking at home each day.  We talked about increasing his home exercise to help increase his stamina and strength.  So we reviewed home exercise with pt today.  Pt plans to walk and use recumbent bike at home for exercise.  Reviewed THR, pulse, RPE, sign and symptoms, and when to call 911 or MD.  Also discussed weather considerations and indoor options.  Pt voiced understanding. Pt is tolerating exercise well.      Expected Outcomes Short and Long: Continue to come to class to work on increasing phsyicial activity. Short: Begin to increase workloads.  Long: Continue to work on increasing physcial activity. Short: Increase exercise time at home.  Long: [place] to increase physcial activity daily. Short - BRush Landmarkwill complete HT program.  Long - BRush Landmarkwill continue to exercise independently.       Discharge Exercise Prescription (Final Exercise Prescription Changes):     Exercise Prescription Changes - 11/07/16 1500      Response to Exercise   Blood Pressure (Admit) 152/72   Blood Pressure (Exercise) 134/72   Blood Pressure (Exit) 132/66   Heart Rate (Admit) 67 bpm   Heart Rate (Exercise) 88 bpm   Heart Rate (Exit) 59 bpm   Rating of Perceived Exertion (Exercise) 11   Symptoms  none   Duration Continue with 45 min of aerobic exercise without signs/symptoms of physical distress.   Intensity THRR unchanged     Progression   Progression Continue to progress workloads to maintain intensity without signs/symptoms of physical distress.   Average METs 2.2     Resistance Training   Training Prescription Yes   Weight 3 lb   Reps 10-15     Interval Training   Interval Training No     Treadmill   MPH 1.3  Grade 0   Minutes 15   METs 2     Recumbant Bike   Level 3   Watts 10   Minutes 15   METs 2.39      Nutrition:  Target Goals: Understanding of nutrition guidelines, daily intake of sodium <1576m, cholesterol <2054m calories 30% from fat and 7% or less from saturated fats, daily to have 5 or more servings of fruits and vegetables.  Biometrics:     Pre Biometrics - 10/01/16 1420      Pre Biometrics   Height 5' 8.3" (1.735 m)   Weight 174 lb (78.9 kg)   Waist Circumference 35 inches   Hip Circumference 38 inches   Waist to Hip Ratio 0.92 %   BMI (Calculated) 26.3   Single Leg Stand 1.81 seconds       Nutrition Therapy Plan and Nutrition Goals:     Nutrition Therapy & Goals - 10/25/16 0852      Nutrition Therapy   RD appointment defered Yes      Nutrition Discharge: Rate Your Plate Scores:     Nutrition Assessments - 10/01/16 1321      MEDFICTS Scores   Pre Score 41      Nutrition Goals Re-Evaluation:     Nutrition Goals Re-Evaluation    Row Name 10/25/16 0852             Goals   Comment declined appointment with nutrtionist       Expected Outcome maintain heart healthy diet          Nutrition Goals Discharge (Final Nutrition Goals Re-Evaluation):     Nutrition Goals Re-Evaluation - 10/25/16 [id number]    Goals   Comment declined appointment with nutrtionist   Expected Outcome maintain heart healthy diet      Psychosocial: Target Goals: Acknowledge presence or absence of significant depression and/or stress,  maximize coping skills, provide positive support system. Participant is able to verbalize types and ability to use techniques and skills needed for reducing stress and depression.   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      Quality of Life Scores:      Quality of Life - 10/01/16 1327      Quality of Life Scores   Health/Function Pre 25.29 %   Socioeconomic Pre 30 %   Psych/Spiritual Pre 29.14 %   Family Pre 30 %   GLOBAL Pre 27.75 %      PHQ-9: Recent Review Flowsheet Data    [facility]   Decreased Interest 0   Down, Depressed, Hopeless 0   PHQ - 2 Score 0   Altered sleeping 3   Tired, decreased energy 3   Change in appetite 0   Feeling bad or failure about yourself  0   Trouble concentrating 0   Moving slowly or fidgety/restless 0   Suicidal thoughts 0   PHQ-9 Score 6   Difficult doing work/chores Somewhat difficult     Interpretation of Total Score  Total Score Depression Severity:  1-4 = Minimal depression, 5-9 = Mild depression, 10-14 = Moderate depression, 15-19 = Moderately severe depression, 20-27 = Severe depression   Psychosocial Evaluation and Intervention:     Psychosocial Evaluation - 11/06/16 0928      Psychosocial  Evaluation & Interventions   Interventions Encouraged to exercise with the program and follow exercise prescription;Relaxation education   Comments Counselor met with Victor Gould [facility] today for initial psychosocial evaluation.  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  He has a strong support system with a spouse of 23 years and a son who lives close by - as well as active involvement in his local church community.  Victor Gould is a diabetic in addition to his heart health issues.  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  He gets maybe 4 hours sleep typically.  He has a good appetite.  Victor Gould denies a history of depression or anxiety and states he is typically positive with minimal stress in his life.  His goals for this program are to increase his stamina and strength and be able to climb the stairs without shortness of breath.  Counselor encouraged Bill to check with his Dr. or pharmacist about possibly considering an OTC natural sleep aid to increase his amount and quality of sleep.  Staff will be following with Bill throughout the course of this program.     Expected Outcomes Victor Gould will benefit from consistent exercise to achieve his stated goals.  He will also benefit from the psychoeducational components of this program to help him relax and possibly experience more quality sleep.  Staff will follow.   Continue Psychosocial Services  Follow up required by staff      Psychosocial Re-Evaluation:     Psychosocial Re-Evaluation    [place] Name 10/25/16 1022             Psychosocial Re-Evaluation   Comments Victor Gould does not acknowledge any major stressor in his life.  He is generally positive.  He is working to get his strength and stamina back. He does note some trouble sleeping due to frequent trips to the bathroom at night.       Expected Outcomes Short: Maintain positive outlook.  Long: Continue to exercise to build strength and stamina.       Interventions Stress management education;Encouraged to attend Cardiac Rehabilitation for the exercise       Continue Psychosocial Services  Follow up required by staff          Psychosocial Discharge (Final Psychosocial Re-Evaluation):     Psychosocial Re-Evaluation - 10/25/16 1022      Psychosocial Re-Evaluation   Comments Victor Gould does not acknowledge any major stressor in his life.  He is generally positive.  He is working to get his strength and stamina back. He does note some trouble sleeping due to frequent trips to the bathroom at  night.   Expected Outcomes Short: Maintain positive outlook.  Long: Continue to exercise to build strength and stamina.   Interventions Stress management education;Encouraged to attend Cardiac Rehabilitation for the exercise   Continue Psychosocial Services  Follow up required by staff      Vocational Rehabilitation: Provide vocational rehab assistance to qualifying candidates.   Vocational Rehab Evaluation & Intervention:     Vocational Rehab - 10/01/16 1333      Initial Vocational Rehab Evaluation & Intervention   Assessment shows need for Vocational Rehabilitation No      Education: Education Goals: Education classes will be provided on a weekly basis, covering required topics. Participant will state understanding/return demonstration of topics presented.  Learning Barriers/Preferences:     Learning Barriers/Preferences - 10/01/16 1333  Learning Barriers/Preferences   Learning Barriers Hearing   Learning Preferences None      Education Topics: General Nutrition Guidelines/Fats and Fiber: -Group instruction provided by verbal, written material, models and posters to present the general guidelines for heart healthy nutrition. Gives an explanation and review of dietary fats and fiber.   Cardiac Rehab from 11/13/2016 in [facility] Cardiac and Pulmonary Rehab  Date  10/22/16  Educator  CR  Instruction Review Code  2- meets goals/outcomes      Controlling Sodium/Reading Food Labels: -Group verbal and written material supporting the discussion of sodium use in heart healthy nutrition. Review and explanation with models, verbal and written materials for utilization of the food label.   Cardiac Rehab from 11/13/2016 in [facility] Cardiac and Pulmonary Rehab  Date  10/30/16  Educator  PI  Instruction Review Code  2- meets goals/outcomes      Exercise Physiology & Risk Factors: - Group verbal and written instruction with models to review the exercise physiology of the  cardiovascular system and associated critical values. Details cardiovascular disease risk factors and the goals associated with each risk factor.   Cardiac Rehab from 11/13/2016 in [facility] Cardiac and Pulmonary Rehab  Date  11/06/16  Educator  AS/RG  Instruction Review Code  2- meets goals/outcomes      Aerobic Exercise & Resistance Training: - Gives group verbal and written discussion on the health impact of inactivity. On the components of aerobic and resistive training programs and the benefits of this training and how to safely progress through these programs.   Cardiac Rehab from 11/13/2016 in [facility] Cardiac and Pulmonary Rehab  Date  11/08/16  Educator  AS  Instruction Review Code  2- meets goals/outcomes      Flexibility, Balance, General Exercise Guidelines: - Provides group verbal and written instruction on the benefits of flexibility and balance training programs. Provides general exercise guidelines with specific guidelines to those with heart or lung disease. Demonstration and skill practice provided.   Cardiac Rehab from 11/13/2016 in [facility] Cardiac and Pulmonary Rehab  Date  11/13/16  Educator  AS  Instruction Review Code  2- meets goals/outcomes      Stress Management: - Provides group verbal and written instruction about the health risks of elevated stress, cause of high stress, and healthy ways to reduce stress.   Depression: - Provides group verbal and written instruction on the correlation between heart/lung disease and depressed mood, treatment options, and the stigmas associated with seeking treatment.   Cardiac Rehab from 11/13/2016 in [facility] Cardiac and Pulmonary Rehab  Date  11/01/16  Educator  [facility]  Instruction Review Code  2- meets goals/outcomes      Anatomy & Physiology of the Heart: - Group verbal and written instruction and models provide basic cardiac anatomy and physiology, with the coronary electrical and arterial systems. Review of: AMI, Angina, Valve  disease, Heart Failure, Cardiac Arrhythmia, Pacemakers, and the ICD.   Cardiac Procedures: - Group verbal and written instruction and models to describe the testing methods done to diagnose heart disease. Reviews the outcomes of the test results. Describes the treatment choices: Medical Management, Angioplasty, or Coronary Bypass Surgery.   Cardiac Medications: - Group verbal and written instruction to review commonly prescribed medications for heart disease. Reviews the medication, class of the drug, and side effects. Includes the steps to properly store meds and maintain the prescription regimen.   Cardiac Rehab from 11/13/2016 in [facility] Cardiac and Pulmonary Rehab  Date  10/09/16 [6/12 Part  One  6/14 Part Two]  Educator  SB  Instruction Review Code  2- meets goals/outcomes      Go Sex-Intimacy & Heart Disease, Get SMART - Goal Setting: - Group verbal and written instruction through game format to discuss heart disease and the return to sexual intimacy. Provides group verbal and written material to discuss and apply goal setting through the application of the S.M.A.R.T. Method.   Other Matters of the Heart: - Provides group verbal, written materials and models to describe Heart Failure, Angina, Valve Disease, and Diabetes in the realm of heart disease. Includes description of the disease process and treatment options available to the cardiac patient.   Exercise & Equipment Safety: - Individual verbal instruction and demonstration of equipment use and safety with use of the equipment.   Cardiac Rehab from 11/13/2016 in [facility] Cardiac and Pulmonary Rehab  Date  10/01/16  Educator  Sb  Instruction Review Code  2- meets goals/outcomes      Infection Prevention: - Provides verbal and written material to individual with discussion of infection control including proper hand washing and proper equipment cleaning during exercise session.   Cardiac Rehab from 11/13/2016 in [facility] Cardiac and  Pulmonary Rehab  Date  10/01/16  Educator  Sb  Instruction Review Code  2- meets goals/outcomes      Falls Prevention: - Provides verbal and written material to individual with discussion of falls prevention and safety.   Cardiac Rehab from 11/13/2016 in [facility] Cardiac and Pulmonary Rehab  Date  10/01/16  Educator  SB  Instruction Review Code  2- meets goals/outcomes      Diabetes: - Individual verbal and written instruction to review signs/symptoms of diabetes, desired ranges of glucose level fasting, after meals and with exercise. Advice that pre and post exercise glucose checks will be done for 3 sessions at entry of program.   Cardiac Rehab from 11/13/2016 in [facility] Cardiac and Pulmonary Rehab  Date  10/01/16  Educator  SB  Instruction Review Code  2- meets goals/outcomes       Knowledge Questionnaire Score:     Knowledge Questionnaire Score - 10/01/16 1333      Knowledge Questionnaire Score   Pre Score 18/28      Core Components/Risk Factors/Patient Goals at Admission:     Personal Goals and Risk Factors at Admission - 10/01/16 1324      Core Components/Risk Factors/Patient Goals on Admission    Weight Management Yes;Weight Maintenance   Intervention Weight Management: Develop a combined nutrition and exercise program designed to reach desired caloric intake, while maintaining appropriate intake of nutrient and fiber, sodium and fats, and appropriate energy expenditure required for the weight goal.   Admit Weight 174 lb (78.9 kg)   Goal Weight: Short Term 172 lb (78 kg)   Goal Weight: Long Term 170 lb (77.1 kg)   Expected Outcomes Short Term: Continue to assess and modify interventions until short term weight is achieved;Long Term: Adherence to nutrition and physical activity/exercise program aimed toward attainment of established weight goal;Weight Maintenance: Understanding of the daily nutrition guidelines, which includes 25-35% calories from fat, 7% or less cal from  saturated fats, less than 239m cholesterol, less than 1.5gm of sodium, & 5 or more servings of fruits and vegetables daily   Diabetes Yes   Intervention Provide education about signs/symptoms and action to take for hypo/hyperglycemia.;Provide education about proper nutrition, including hydration, and aerobic/resistive exercise prescription along with prescribed medications to achieve blood glucose in normal ranges:  Fasting glucose 65-99 mg/dL   Expected Outcomes Short Term: Participant verbalizes understanding of the signs/symptoms and immediate care of hyper/hypoglycemia, proper foot care and importance of medication, aerobic/resistive exercise and nutrition plan for blood glucose control.;Long Term: Attainment of HbA1C < 7%.  LAst A1C was 7.7%   Hypertension Yes   Intervention Provide education on lifestyle modifcations including regular physical activity/exercise, weight management, moderate sodium restriction and increased consumption of fresh fruit, vegetables, and low fat dairy, alcohol moderation, and smoking cessation.;Monitor prescription use compliance.   Expected Outcomes Short Term: Continued assessment and intervention until BP is < 140/38m HG in hypertensive participants. < 130/859mHG in hypertensive participants with diabetes, heart failure or chronic kidney disease.;Long Term: Maintenance of blood pressure at goal levels.   Lipids Yes   Intervention Provide education and support for participant on nutrition & aerobic/resistive exercise along with prescribed medications to achieve LDL <7078mHDL >62m36mMedication started recently   Expected Outcomes Short Term: Participant states understanding of desired cholesterol values and is compliant with medications prescribed. Participant is following exercise prescription and nutrition guidelines.;Long Term: Cholesterol controlled with medications as prescribed, with individualized exercise RX and with personalized nutrition plan. Value goals:  LDL < 70mg35mL > 40 mg.      Core Components/Risk Factors/Patient Goals Review:      Goals and Risk Factor Review    Row Name 10/25/16 1015             Core Components/Risk Factors/Patient Goals Review   Personal Goals Review Weight Management/Obesity;Hypertension;Lipids       Review Bill's weight is starting to trend back up as his appetite has finally recovered since surgery.  His blood pressures have been up and down.  His peak was 194 at home; he rested for awhile and it started to come down.  He had talked to his doctor and was told to take an extra pill when this happened..  HeMarland Kitchenis also good at checking his blood sugars and they are up and down as well. He does check them at least three times a day and any time that he feels low.   Despite this, he declined an appointment with the nutrtionist.  Bill Victor Landmarknot had any problems with his statin medication.       Expected Outcomes Short: Stay on top of weight to avoid overeating and gaining too much back.  Long: Work on better blood pressure and blood sugar management.          Core Components/Risk Factors/Patient Goals at Discharge (Final Review):      Goals and Risk Factor Review - 10/25/16 1015      Core Components/Risk Factors/Patient Goals Review   Personal Goals Review Weight Management/Obesity;Hypertension;Lipids   Review Bill's weight is starting to trend back up as his appetite has finally recovered since surgery.  His blood pressures have been up and down.  His peak was 194 at home; he rested for awhile and it started to come down.  He had talked to his doctor and was told to take an extra pill when this happened..  HeMarland Kitchenis also good at checking his blood sugars and they are up and down as well. He does check them at least three times a day and any time that he feels low.   Despite this, he declined an appointment with the nutrtionist.  Bill Victor Landmarknot had any problems with his statin medication.   Expected Outcomes Short: Stay on  top of weight  to avoid overeating and gaining too much back.  Long: Work on better blood pressure and blood sugar management.      ITP Comments:     ITP Comments    Row Name 10/01/16 1316 10/17/16 0544 11/14/16 0649       ITP Comments Medical Review Completed today. Initial ITP created. Diaagnosis documnetation can be found in [facility] 08/25/2016 Admission 30 day review. Continue with ITP unless directed changes per Medical Director review.  New to program 30 day review. Continue with ITP unless directed changes per Medical Director review           Comments:

## 2016-11-15 ENCOUNTER — Encounter: Payer: Medicare Other | Admitting: *Deleted

## 2016-11-15 DIAGNOSIS — Z951 Presence of aortocoronary bypass graft: Secondary | ICD-10-CM

## 2016-11-15 NOTE — Progress Notes (Addendum)
Daily Session Note  Patient Details  Name: Victor Gould MRN: [id number] Date of Birth: May 05, 1940 Referring Provider:     Cardiac Rehab from 10/01/2016 in [facility] Cardiac and Pulmonary Rehab  Referring Provider  Ida Rogue MD      Encounter Date: 11/15/2016  Check In:     Session Check In - 11/15/16 0921      Check-In   Location [facility]-Cardiac & Pulmonary Rehab   Staff Present Alberteen Sam, MA, ACSM RCEP, Exercise Physiologist;Amanda Oletta Darter, BA, ACSM CEP, Exercise Physiologist;Krista Frederico Hamman, RN BSN   Supervising physician immediately available to respond to emergencies See telemetry face sheet for immediately available ER MD   Medication changes reported     Yes   Comments Decreased blood pressure medication to once a day   Fall or balance concerns reported    No   Warm-up and Cool-down Performed on first and last piece of equipment   Resistance Training Performed Yes   VAD Patient? No     Pain Assessment   Currently in Pain? No/denies   Multiple Pain Sites No         History  Smoking Status  . Never Smoker  Smokeless Tobacco  . Never Used    Goals Met:  Independence with exercise equipment Exercise tolerated well No report of cardiac concerns or symptoms Strength training completed today  Goals Unmet:  Not Applicable  Comments: Pt able to follow exercise prescription today without complaint.  Will continue to monitor for progression.    Dr. [name] is Medical Director for [place] and [facility].

## 2016-11-20 DIAGNOSIS — Z951 Presence of aortocoronary bypass graft: Secondary | ICD-10-CM

## 2016-11-20 NOTE — Progress Notes (Signed)
Daily Session Note  Patient Details  Name: Victor Gould MRN: [id number] Date of Birth: 10-28-1940 Referring Provider:     Cardiac Rehab from 10/01/2016 in [facility] Cardiac and Pulmonary Rehab  Referring Provider  Ida Rogue MD      Encounter Date: 11/20/2016  Check In:     Session Check In - 11/20/16 0905      Check-In   Location [facility]-Cardiac & Pulmonary Rehab   Staff Present Heath Lark, RN, BSN, CCRP;Jessica [place], MA, ACSM RCEP, Exercise Physiologist;Mega Kinkade Oletta Darter, BA, ACSM CEP, Exercise Physiologist   Supervising physician immediately available to respond to emergencies See telemetry face sheet for immediately available ER MD   Medication changes reported     No   Fall or balance concerns reported    No   Warm-up and Cool-down Performed on first and last piece of equipment   Resistance Training Performed Yes   VAD Patient? No     Pain Assessment   Currently in Pain? No/denies         History  Smoking Status  . Never Smoker  Smokeless Tobacco  . Never Used    Goals Met:  Independence with exercise equipment Exercise tolerated well No report of cardiac concerns or symptoms Strength training completed today  Goals Unmet:  Not Applicable  Comments: Pt able to follow exercise prescription today without complaint.  Will continue to monitor for progression.    Dr. [name] is Medical Director for [place] and [facility].

## 2016-11-22 DIAGNOSIS — Z951 Presence of aortocoronary bypass graft: Secondary | ICD-10-CM | POA: Diagnosis not present

## 2016-11-22 NOTE — Progress Notes (Signed)
Daily Session Note  Patient Details  Name: NIRAJ KUDRNA MRN: [id number] Date of Birth: 11/24/1940 Referring Provider:     Cardiac Rehab from 10/01/2016 in [facility] Cardiac and Pulmonary Rehab  Referring Provider  Ida Rogue MD      Encounter Date: 11/22/2016  Check In:     Session Check In - 11/22/16 0831      Check-In   Location [facility]-Cardiac & Pulmonary Rehab   Staff Present Alberteen Sam, MA, ACSM RCEP, Exercise Physiologist;Amanda Oletta Darter, BA, ACSM CEP, Exercise Physiologist;Meredith Sherryll Burger, RN BSN   Supervising physician immediately available to respond to emergencies See telemetry face sheet for immediately available ER MD   Medication changes reported     No   Fall or balance concerns reported    No   Warm-up and Cool-down Performed on first and last piece of equipment   Resistance Training Performed Yes   VAD Patient? No     Pain Assessment   Currently in Pain? No/denies           Exercise Prescription Changes - 11/21/16 1500      Response to Exercise   Blood Pressure (Admit) 138/74   Blood Pressure (Exercise) 150/74   Blood Pressure (Exit) 138/76   Heart Rate (Admit) 80 bpm   Heart Rate (Exercise) 99 bpm   Heart Rate (Exit) 63 bpm   Rating of Perceived Exertion (Exercise) 12   Symptoms none   Duration Continue with 45 min of aerobic exercise without signs/symptoms of physical distress.   Intensity THRR unchanged     Progression   Progression Continue to progress workloads to maintain intensity without signs/symptoms of physical distress.   Average METs 2.56     Resistance Training   Training Prescription Yes   Weight 3 lb   Reps 10-15     Interval Training   Interval Training No     Treadmill   MPH 1.3   Grade 0   Minutes 15   METs 2     Recumbant Bike   Level 3   Watts 25   Minutes 15   METs 2.87     NuStep   Level 4   Minutes 15   METs 2.7     Home Exercise Plan   Plans to continue exercise at Home (comment)  walk   Frequency Add 3 additional days to program exercise sessions.   Initial Home Exercises Provided 10/25/16      History  Smoking Status  . Never Smoker  Smokeless Tobacco  . Never Used    Goals Met:  Independence with exercise equipment Exercise tolerated well Personal goals reviewed No report of cardiac concerns or symptoms Strength training completed today  Goals Unmet:  Not Applicable  Comments: Pt able to follow exercise prescription today without complaint.  Will continue to monitor for progression.    Dr. [name] is Medical Director for [place] and [facility].

## 2016-11-27 DIAGNOSIS — Z951 Presence of aortocoronary bypass graft: Secondary | ICD-10-CM | POA: Diagnosis not present

## 2016-11-27 NOTE — Progress Notes (Signed)
Daily Session Note  Patient Details  Name: Victor Gould MRN: [id number] Date of Birth: 1940/09/16 Referring Provider:     Cardiac Rehab from 10/01/2016 in [facility] Cardiac and Pulmonary Rehab  Referring Provider  Ida Rogue MD      Encounter Date: 11/27/2016  Check In:     Session Check In - 11/27/16 0839      Check-In   Location [facility]-Cardiac & Pulmonary Rehab   Staff Present Nada Maclachlan, BA, ACSM CEP, Exercise Physiologist;Krista Frederico Hamman, RN BSN;Carroll Enterkin, RN, BSN   Supervising physician immediately available to respond to emergencies See telemetry face sheet for immediately available ER MD   Medication changes reported     No   Fall or balance concerns reported    No   Warm-up and Cool-down Performed on first and last piece of equipment   Resistance Training Performed Yes   VAD Patient? No     Pain Assessment   Currently in Pain? No/denies         History  Smoking Status  . Never Smoker  Smokeless Tobacco  . Never Used    Goals Met:  Independence with exercise equipment Exercise tolerated well No report of cardiac concerns or symptoms Strength training completed today  Goals Unmet:  Not Applicable  Comments: Pt able to follow exercise prescription today without complaint.  Will continue to monitor for progression.    Dr. [name] is Medical Director for [place] and [facility].

## 2016-11-29 ENCOUNTER — Encounter: Payer: Medicare Other | Attending: Interventional Cardiology

## 2016-11-29 DIAGNOSIS — Z951 Presence of aortocoronary bypass graft: Secondary | ICD-10-CM | POA: Diagnosis not present

## 2016-11-29 NOTE — Progress Notes (Signed)
Daily Session Note  Patient Details  Name: Victor Gould MRN: [id number] Date of Birth: May 03, 1940 Referring Provider:     Cardiac Rehab from 10/01/2016 in [facility] Cardiac and Pulmonary Rehab  Referring Provider  Ida Rogue MD      Encounter Date: 11/29/2016  Check In:     Session Check In - 11/29/16 0846      Check-In   Location [facility]-Cardiac & Pulmonary Rehab   Staff Present Alberteen Sam, MA, ACSM RCEP, Exercise Physiologist;Amanda Oletta Darter, BA, ACSM CEP, Exercise Physiologist;Meredith Sherryll Burger, RN BSN   Supervising physician immediately available to respond to emergencies See telemetry face sheet for immediately available ER MD   Medication changes reported     No   Fall or balance concerns reported    No   Warm-up and Cool-down Performed on first and last piece of equipment   Resistance Training Performed Yes   VAD Patient? No     Pain Assessment   Currently in Pain? No/denies         History  Smoking Status  . Never Smoker  Smokeless Tobacco  . Never Used    Goals Met:  Independence with exercise equipment Exercise tolerated well No report of cardiac concerns or symptoms Strength training completed today  Goals Unmet:  Not Applicable  Comments: Pt able to follow exercise prescription today without complaint.  Will continue to monitor for progression.    Dr. [name] is Medical Director for [place] and [facility].

## 2016-12-04 ENCOUNTER — Encounter: Payer: Medicare Other | Admitting: *Deleted

## 2016-12-04 VITALS — Ht 68.3 in | Wt 175.0 lb

## 2016-12-04 DIAGNOSIS — Z951 Presence of aortocoronary bypass graft: Secondary | ICD-10-CM | POA: Diagnosis not present

## 2016-12-04 NOTE — Progress Notes (Signed)
Daily Session Note  Patient Details  Name: Victor Gould MRN: [id number] Date of Birth: 1941-01-28 Referring Provider:     Cardiac Rehab from 10/01/2016 in [facility] Cardiac and Pulmonary Rehab  Referring Provider  Ida Rogue MD      Encounter Date: 12/04/2016  Check In:     Session Check In - 12/04/16 0821      Check-In   Location [facility]-Cardiac & Pulmonary Rehab   Staff Present Alberteen Sam, MA, ACSM RCEP, Exercise Physiologist;Susanne Bice, RN, BSN, Florestine Avers, RN BSN   Supervising physician immediately available to respond to emergencies See telemetry face sheet for immediately available ER MD   Medication changes reported     No   Fall or balance concerns reported    No   Warm-up and Cool-down Performed on first and last piece of equipment   Resistance Training Performed Yes   VAD Patient? No     Pain Assessment   Currently in Pain? No/denies   Multiple Pain Sites No         History  Smoking Status  . Never Smoker  Smokeless Tobacco  . Never Used    Goals Met:  Independence with exercise equipment Exercise tolerated well No report of cardiac concerns or symptoms Strength training completed today  Goals Unmet:  Not Applicable  Comments: Pt able to follow exercise prescription today without complaint.  Will continue to monitor for progression.     [place] Name 10/01/16 1417 12/04/16 0923       6 Minute Walk   Phase Initial Discharge    Distance 1100 feet 1300 feet    Distance % Change  - 18.1 %  200 ft    Walk Time 6 minutes 6 minutes    # of Rest Breaks 0 0    MPH 2.08 2.46    METS 2.01 2.55    RPE 11 9    VO2 Peak 7.04 8.94    Symptoms No No    Resting HR 54 bpm 63 bpm    Resting BP 136/66 140/62    Max Ex. HR 68 bpm 90 bpm    Max Ex. BP 142/70 146/70    2 Minute Post BP 146/70  recheck 126 74  -         Dr. [name] is Medical Director for [place] and [facility]  [facility].

## 2016-12-06 ENCOUNTER — Encounter: Payer: Medicare Other | Admitting: *Deleted

## 2016-12-06 DIAGNOSIS — Z951 Presence of aortocoronary bypass graft: Secondary | ICD-10-CM

## 2016-12-06 NOTE — Progress Notes (Signed)
Daily Session Note  Patient Details  Name: Victor Gould MRN: [id number] Date of Birth: 11/29/1940 Referring Provider:     Cardiac Rehab from 10/01/2016 in [facility] Cardiac and Pulmonary Rehab  Referring Provider  Gollan, Timothy MD      Encounter Date: 12/06/2016  Check In:     Session Check In - 12/06/16 0818      Check-In   Location [facility]-Cardiac & Pulmonary Rehab   Staff Present Jessica Hawkins, MA, ACSM RCEP, Exercise Physiologist;Krista Spencer, RN BSN;Meredith Craven, RN BSN   Supervising physician immediately available to respond to emergencies See telemetry face sheet for immediately available ER MD   Medication changes reported     No   Fall or balance concerns reported    No   Warm-up and Cool-down Performed on first and last piece of equipment   Resistance Training Performed Yes   VAD Patient? No     Pain Assessment   Currently in Pain? No/denies   Multiple Pain Sites No         History  Smoking Status  . Never Smoker  Smokeless Tobacco  . Never Used    Goals Met:  Independence with exercise equipment Exercise tolerated well No report of cardiac concerns or symptoms Strength training completed today  Goals Unmet:  Not Applicable  Comments: Pt able to follow exercise prescription today without complaint.  Will continue to monitor for progression.    Dr. Mark Miller is Medical Director for [facility] and [facility]. 

## 2016-12-11 ENCOUNTER — Encounter: Payer: Medicare Other | Admitting: *Deleted

## 2016-12-11 DIAGNOSIS — Z951 Presence of aortocoronary bypass graft: Secondary | ICD-10-CM | POA: Diagnosis not present

## 2016-12-11 NOTE — Progress Notes (Signed)
Daily Session Note  Patient Details  Name: Victor Gould MRN: [id number] Date of Birth: 12-Apr-1941 Referring Provider:     Cardiac Rehab from 10/01/2016 in [facility] Cardiac and Pulmonary Rehab  Referring Provider  Ida Rogue MD      Encounter Date: 12/11/2016  Check In:     Session Check In - 12/11/16 0826      Check-In   Location [facility]-Cardiac & Pulmonary Rehab   Staff Present Heath Lark, RN, BSN, CCRP;Eusebia Grulke Luan Pulling, MA, ACSM RCEP, Exercise Physiologist;Amanda Oletta Darter, BA, ACSM CEP, Exercise Physiologist   Supervising physician immediately available to respond to emergencies See telemetry face sheet for immediately available ER MD   Medication changes reported     No   Fall or balance concerns reported    No   Warm-up and Cool-down Performed on first and last piece of equipment   Resistance Training Performed Yes   VAD Patient? No     Pain Assessment   Currently in Pain? No/denies   Multiple Pain Sites No         History  Smoking Status  . Never Smoker  Smokeless Tobacco  . Never Used    Goals Met:  Independence with exercise equipment Exercise tolerated well No report of cardiac concerns or symptoms Strength training completed today  Goals Unmet:  Not Applicable  Comments: Pt able to follow exercise prescription today without complaint.  Will continue to monitor for progression.    Dr. [name] is Medical Director for [place] and [facility].

## 2016-12-11 NOTE — Patient Instructions (Signed)
Discharge Progress Report   Patient Details  Name: Victor Gould MRN: [id number] Date of Birth: [birth year] Referring Provider:  [name], MD   Number of Visits: 36/36  Reason for Discharge:  Patient reached a stable level of exercise. Patient independent in their exercise.  Smoking History:  History  Smoking Status  . Never Smoker  Smokeless Tobacco  . Never Used    Diagnosis:  S/P CABG x 5  Initial Exercise Prescription:     Initial Exercise Prescription - 10/01/16 1400      Date of Initial Exercise RX and Referring Provider   Date 10/01/16   Referring Provider Ida Rogue MD     Treadmill   MPH 1.3   Grade 0   Minutes 15   METs 2     Recumbant Bike   Level 1   RPM 40   Watts 10   Minutes 15   METs 2.39     NuStep   Level 1   SPM 80   Minutes 15   METs 2     Prescription Details   Frequency (times per week) 2   Duration Progress to 45 minutes of aerobic exercise without signs/symptoms of physical distress     Intensity   THRR 40-80% of Max Heartrate 90-125   Ratings of Perceived Exertion 11-15   Perceived Dyspnea 0-4     Progression   Progression Continue to progress workloads to maintain intensity without signs/symptoms of physical distress.     Resistance Training   Training Prescription Yes   Weight 3 lbs   Reps 10-15      Discharge Exercise Prescription (Final Exercise Prescription Changes):     Exercise Prescription Changes - 12/07/16 0700      Response to Exercise   Blood Pressure (Admit) 148/64   Blood Pressure (Exercise) 142/76   Blood Pressure (Exit) 136/82   Heart Rate (Admit) 78 bpm   Heart Rate (Exercise) 97 bpm   Heart Rate (Exit) 67 bpm   Rating of Perceived Exertion (Exercise) 11   Symptoms none   Duration Continue with 45 min of aerobic exercise without signs/symptoms of physical distress.   Intensity THRR unchanged     Progression   Progression Continue to progress workloads to maintain intensity  without signs/symptoms of physical distress.   Average METs 2.91     Resistance Training   Training Prescription Yes   Weight 3 lb   Reps 10-15     Interval Training   Interval Training No     Treadmill   MPH 1.7   Grade 1   Minutes 15   METs 2.54     Recumbant Bike   Level 5   Watts 29   Minutes 15   METs 3.18     NuStep   Level 5   Minutes 15   METs 3     Home Exercise Plan   Plans to continue exercise at Home (comment)  walk   Frequency Add 3 additional days to program exercise sessions.   Initial Home Exercises Provided 10/25/16      Functional Capacity:     6 Minute Walk    Row Name 10/01/16 1417 12/04/16 0923       6 Minute Walk   Phase Initial Discharge    Distance 1100 feet 1300 feet    Distance % Change  - 18.1 %  200 ft    Walk Time 6 minutes 6 minutes    #  of Rest Breaks 0 0    MPH 2.08 2.46    METS 2.01 2.55    RPE 11 9    VO2 Peak 7.04 8.94    Symptoms No No    Resting HR 54 bpm 63 bpm    Resting BP 136/66 140/62    Max Ex. HR 68 bpm 90 bpm    Max Ex. BP 142/70 146/70    2 Minute Post BP 146/70  recheck 126 74  -       Quality of Life:     Quality of Life - 12/11/16 0842      Quality of Life Scores   Health/Function Pre 25.29 %   Health/Function Post 17.83 %   Health/Function % Change -29.5 %   Socioeconomic Pre 30 %   Socioeconomic Post 20.86 %   Socioeconomic % Change  -30.47 %   Psych/Spiritual Pre 29.14 %   Psych/Spiritual Post 23.43 %   Psych/Spiritual % Change -19.6 %   Family Pre 30 %   Family Post 19.5 %   Family % Change -35 %   GLOBAL Pre 27.75 %   GLOBAL Post 19.85 %   GLOBAL % Change -28.47 %      Personal Goals: Goals established at orientation with interventions provided to work toward goal.     Personal Goals and Risk Factors at Admission - 10/01/16 1324      Core Components/Risk Factors/Patient Goals on Admission    Weight Management Yes;Weight Maintenance   Intervention Weight Management:  Develop a combined nutrition and exercise program designed to reach desired caloric intake, while maintaining appropriate intake of nutrient and fiber, sodium and fats, and appropriate energy expenditure required for the weight goal.   Admit Weight 174 lb (78.9 kg)   Goal Weight: Short Term 172 lb (78 kg)   Goal Weight: Long Term 170 lb (77.1 kg)   Expected Outcomes Short Term: Continue to assess and modify interventions until short term weight is achieved;Long Term: Adherence to nutrition and physical activity/exercise program aimed toward attainment of established weight goal;Weight Maintenance: Understanding of the daily nutrition guidelines, which includes 25-35% calories from fat, 7% or less cal from saturated fats, less than 200mg  cholesterol, less than 1.5gm of sodium, & 5 or more servings of fruits and vegetables daily   Diabetes Yes   Intervention Provide education about signs/symptoms and action to take for hypo/hyperglycemia.;Provide education about proper nutrition, including hydration, and aerobic/resistive exercise prescription along with prescribed medications to achieve blood glucose in normal ranges: Fasting glucose 65-99 mg/dL   Expected Outcomes Short Term: Participant verbalizes understanding of the signs/symptoms and immediate care of hyper/hypoglycemia, proper foot care and importance of medication, aerobic/resistive exercise and nutrition plan for blood glucose control.;Long Term: Attainment of HbA1C < 7%.  LAst A1C was 7.7%   Hypertension Yes   Intervention Provide education on lifestyle modifcations including regular physical activity/exercise, weight management, moderate sodium restriction and increased consumption of fresh fruit, vegetables, and low fat dairy, alcohol moderation, and smoking cessation.;Monitor prescription use compliance.   Expected Outcomes Short Term: Continued assessment and intervention until BP is < 140/67mm HG in hypertensive participants. < 130/30mm HG in  hypertensive participants with diabetes, heart failure or chronic kidney disease.;Long Term: Maintenance of blood pressure at goal levels.   Lipids Yes   Intervention Provide education and support for participant on nutrition & aerobic/resistive exercise along with prescribed medications to achieve LDL 70mg , HDL >40mg .  Medication started recently   Expected  Outcomes Short Term: Participant states understanding of desired cholesterol values and is compliant with medications prescribed. Participant is following exercise prescription and nutrition guidelines.;Long Term: Cholesterol controlled with medications as prescribed, with individualized exercise RX and with personalized nutrition plan. Value goals: LDL < 70mg , HDL > 40 mg.       Personal Goals Discharge:     Goals and Risk Factor Review - 11/22/16 1059      Core Components/Risk Factors/Patient Goals Review   Personal Goals Review Weight Management/Obesity;Hypertension;Lipids;Diabetes   Review Bill's weight has been fairly steady only varying by 1-2 lbs.  He has had good blood pressure reading he and at home.  They even decreased he BP medication which seems to be helping.  Blood sugars continue to all over the place as it depends on what he is eating and whether or not he is working outside as well.   He continues to check it regularly.  He has not had any problems with his medication.  He is going to talk with his doctor about changing the time of day that he takes his fluid pill.   Expected Outcomes Short: Talk about changing time for fluid pill. Long: Continue to work on blood sugar control.      Nutrition & Weight - Outcomes:     Pre Biometrics - 10/01/16 1420      Pre Biometrics   Height 5' 8.3" (1.735 m)   Weight 174 lb (78.9 kg)   Waist Circumference 35 inches   Hip Circumference 38 inches   Waist to Hip Ratio 0.92 %   BMI (Calculated) 26.3   Single Leg Stand 1.81 seconds         Post Biometrics - 12/04/16 0928        Post  Biometrics   Height 5' 8.3" (1.735 m)   Weight 175 lb (79.4 kg)   Waist Circumference 35 inches   Hip Circumference 38 inches   Waist to Hip Ratio 0.92 %   BMI (Calculated) 26.4   Single Leg Stand 8.61 seconds      Nutrition:     Nutrition Therapy & Goals - 11/22/16 1103      Nutrition Therapy   RD appointment defered Yes      Nutrition Discharge:     Nutrition Assessments - 12/11/16 0849      MEDFICTS Scores   Pre Score 41   Post Score 53   Score Difference 12      Education Questionnaire Score:     Knowledge Questionnaire Score - 12/11/16 0902      Knowledge Questionnaire Score   Pre Score 18/28   Post Score 22/28      Goals reviewed with patient; copy given to patient.

## 2016-12-12 ENCOUNTER — Encounter: Payer: Self-pay | Admitting: *Deleted

## 2016-12-12 DIAGNOSIS — Z951 Presence of aortocoronary bypass graft: Secondary | ICD-10-CM

## 2016-12-12 NOTE — Progress Notes (Signed)
Cardiac Individual Treatment Plan  Patient Details  Name: Victor Gould MRN: [id number] Date of Birth: [birth year] Referring Provider:     Cardiac Rehab from 10/01/2016 in [facility] Cardiac and Pulmonary Rehab  Referring Provider  Ida Rogue MD      Initial Encounter Date:    Cardiac Rehab from 10/01/2016 in [facility] Cardiac and Pulmonary Rehab  Date  10/01/16  Referring Provider  Ida Rogue MD      Visit Diagnosis: S/P CABG x 5  Patient's Home Medications on Admission:  Current Outpatient Prescriptions:  .  acetaminophen (TYLENOL) 500 MG tablet, Take 1,000 mg by mouth daily as needed for headache (pain)., Disp: , Rfl:  .  amiodarone (PACERONE) 200 MG tablet, Take 1 tablet (200 mg total) by mouth every 12 (twelve) hours. For 2 days then take Amiodarone 200 mg by mouth daily thereafter., Disp: 30 tablet, Rfl: 1 .  aspirin EC 325 MG EC tablet, Take 1 tablet (325 mg total) by mouth daily., Disp: 30 tablet, Rfl: 0 .  atorvastatin (LIPITOR) 40 MG tablet, Take 1 tablet (40 mg total) by mouth daily at 6 PM., Disp: 30 tablet, Rfl: 1 .  b complex vitamins tablet, Take 1 tablet by mouth daily after breakfast., Disp: , Rfl:  .  cholecalciferol (VITAMIN D) 1000 units tablet, Take 1,000 Units by mouth daily after breakfast., Disp: , Rfl:  .  clopidogrel (PLAVIX) 75 MG tablet, Take 1 tablet (75 mg total) by mouth daily., Disp: 90 tablet, Rfl: 3 .  hydrocortisone cream 1 %, Apply 1 application topically daily as needed for itching., Disp: , Rfl:  .  insulin glargine (LANTUS) 100 unit/mL SOPN, Inject 17 Units into the skin daily before breakfast., Disp: , Rfl:  .  insulin lispro (HUMALOG KWIKPEN) 100 UNIT/ML KiwkPen, Inject 7-10 Units into the skin See admin instructions. Inject 7 units subcutaneously before breakfast, lunch and dinner - plus adjustment for CBG 140-180 add 1 unit, 180-220 add 3 units, >220 add 3 units, Disp: , Rfl:  .  levothyroxine (SYNTHROID, LEVOTHROID) 75 MCG tablet, Take 75 mcg  by mouth daily before breakfast., Disp: , Rfl:  .  losartan (COZAAR) 25 MG tablet, Take 1 tablet (25 mg total) by mouth daily after breakfast., Disp: 30 tablet, Rfl: 1 .  metoprolol tartrate (LOPRESSOR) 25 MG tablet, Take 1 tablet (25 mg total) by mouth 2 (two) times daily., Disp: 60 tablet, Rfl: 1 .  Multiple Vitamin (MULTIVITAMIN WITH MINERALS) TABS tablet, Take 1 tablet by mouth daily after breakfast., Disp: , Rfl:  .  omeprazole (PRILOSEC) 20 MG capsule, Take 20 mg by mouth daily before breakfast. , Disp: , Rfl:  .  OVER THE COUNTER MEDICATION, Place 1 drop into both eyes daily as needed (dry eyes). Over the counter lubricating eye drops - must be preservative free, Disp: , Rfl:   Past Medical History: Past Medical History:  Diagnosis Date  . Cancer [facility])    prostate  . Diabetes mellitus   . GERD (gastroesophageal reflux disease)   . Hemorrhoids   . Hypertension    PCP [name] at [place]  . PONV (postoperative nausea and vomiting)   . Thyroid disease     Tobacco Use: History  Smoking Status  . Never Smoker  Smokeless Tobacco  . Never Used    Labs: Recent Review Flowsheet Data    Labs for ITP Cardiac and Pulmonary Rehab Latest Ref Rng & Units 08/28/2016 08/29/2016 08/29/2016 08/29/2016 08/29/2016   Cholestrol 0 -  200 mg/dL - - - - -   LDLCALC 0 - 99 mg/dL - - - - -   HDL >40 mg/dL - - - - -   Trlycerides <150 mg/dL - - - - -   Hemoglobin A1c 4.8 - 5.6 % - - - - -   PHART 7.350 - 7.450 7.361 7.420 7.418 7.391 -   PCO2ART 32.0 - 48.0 mmHg 32.7 30.2(L) 29.9(L) 30.5(L) -   HCO3 20.0 - 28.0 mmol/L 18.5(L) 19.6(L) 19.3(L) 18.5(L) -   TCO2 0 - 100 mmol/L '20 21 20 19 23   ' ACIDBASEDEF 0.0 - 2.0 mmol/L 6.0(H) 4.0(H) 4.0(H) 6.0(H) -   O2SAT % 93.0 97.0 94.0 93.0 -       Exercise Target Goals:    Exercise Program Goal: Individual exercise prescription set with THRR, safety & activity barriers. Participant demonstrates ability to understand and report RPE using BORG scale, to  self-measure pulse accurately, and to acknowledge the importance of the exercise prescription.  Exercise Prescription Goal: Starting with aerobic activity 30 plus minutes a day, 3 days per week for initial exercise prescription. Provide home exercise prescription and guidelines that participant acknowledges understanding prior to discharge.  Activity Barriers & Risk Stratification:     Activity Barriers & Cardiac Risk Stratification - 10/01/16 1320      Activity Barriers & Cardiac Risk Stratification   Activity Barriers Deconditioning;Muscular Weakness;Balance Concerns   Cardiac Risk Stratification High      6 Minute Walk:     6 Minute Walk    Row Name 10/01/16 1417 12/04/16 0923       6 Minute Walk   Phase Initial Discharge    Distance 1100 feet 1300 feet    Distance % Change  - 18.1 %  200 ft    Walk Time 6 minutes 6 minutes    # of Rest Breaks 0 0    MPH 2.08 2.46    METS 2.01 2.55    RPE 11 9    VO2 Peak 7.04 8.94    Symptoms No No    Resting HR 54 bpm 63 bpm    Resting BP 136/66 140/62    Max Ex. HR 68 bpm 90 bpm    Max Ex. BP 142/70 146/70    2 Minute Post BP 146/70  recheck 126 74  -       Oxygen Initial Assessment:   Oxygen Re-Evaluation:   Oxygen Discharge (Final Oxygen Re-Evaluation):   Initial Exercise Prescription:     Initial Exercise Prescription - 10/01/16 1400      Date of Initial Exercise RX and Referring Provider   Date 10/01/16   Referring Provider Ida Rogue MD     Treadmill   MPH 1.3   Grade 0   Minutes 15   METs 2     Recumbant Bike   Level 1   RPM 40   Watts 10   Minutes 15   METs 2.39     NuStep   Level 1   SPM 80   Minutes 15   METs 2     Prescription Details   Frequency (times per week) 2   Duration Progress to 45 minutes of aerobic exercise without signs/symptoms of physical distress     Intensity   THRR 40-80% of Max Heartrate 90-125   Ratings of Perceived Exertion 11-15   Perceived Dyspnea 0-4      Progression   Progression Continue to progress workloads to maintain intensity without  signs/symptoms of physical distress.     Resistance Training   Training Prescription Yes   Weight 3 lbs   Reps 10-15      Perform Capillary Blood Glucose checks as needed.  Exercise Prescription Changes:     Exercise Prescription Changes    Row Name 10/01/16 1300 10/11/16 1100 10/23/16 1400 11/07/16 1500 11/21/16 1500     Response to Exercise   Blood Pressure (Admit) 136/66 126/64 126/64 152/72 138/74   Blood Pressure (Exercise) 148/70 124/64 126/60 134/72 150/74   Blood Pressure (Exit) 146/70 124/64 138/66 132/66 138/76   Heart Rate (Admit) 54 bpm 69 bpm 82 bpm 67 bpm 80 bpm   Heart Rate (Exercise) 68 bpm 91 bpm 107 bpm 88 bpm 99 bpm   Heart Rate (Exit) 57 bpm 58 bpm 56 bpm 59 bpm 63 bpm   Oxygen Saturation (Admit) 100 %  -  -  -  -   Oxygen Saturation (Exercise) 96 %  -  -  -  -   Rating of Perceived Exertion (Exercise) '11 13 12 11 12   ' Symptoms none none none none none   Comments walk test results only second day of exercise  -  -  -   Duration  - Progress to 45 minutes of aerobic exercise without signs/symptoms of physical distress Continue with 45 min of aerobic exercise without signs/symptoms of physical distress. Continue with 45 min of aerobic exercise without signs/symptoms of physical distress. Continue with 45 min of aerobic exercise without signs/symptoms of physical distress.   Intensity  - THRR unchanged THRR unchanged THRR unchanged THRR unchanged     Progression   Progression  - Continue to progress workloads to maintain intensity without signs/symptoms of physical distress. Continue to progress workloads to maintain intensity without signs/symptoms of physical distress. Continue to progress workloads to maintain intensity without signs/symptoms of physical distress. Continue to progress workloads to maintain intensity without signs/symptoms of physical distress.   Average METs   - 2.16 2.29 2.2 2.56     Resistance Training   Training Prescription  - Yes Yes Yes Yes   Weight  - 3 lbs 3 lbs 3 lb 3 lb   Reps  - 10-15 10-15 10-15 10-15     Interval Training   Interval Training  - No No No No     Treadmill   MPH  - 1.3 1.3 1.3 1.3   Grade  - 0 0 0 0   Minutes  - '15 15 15 15   ' METs  - '2 2 2 2     ' Recumbant Bike   Level  - '1 3 3 3   ' Watts  - '13 10 10 25   ' Minutes  - '15 15 15 15   ' METs  - 2.39 2.39 2.39 2.87     NuStep   Level  - 1 1  - 4   Minutes  - 15 15  - 15   METs  - 2.1 2.5  - 2.7     Home Exercise Plan   Plans to continue exercise at  -  -  -  - Home (comment)  walk   Frequency  -  -  -  - Add 3 additional days to program exercise sessions.   Initial Home Exercises Provided  -  -  -  - 10/25/16   Row Name 12/07/16 0700             Response to Exercise  Blood Pressure (Admit) 148/64       Blood Pressure (Exercise) 142/76       Blood Pressure (Exit) 136/82       Heart Rate (Admit) 78 bpm       Heart Rate (Exercise) 97 bpm       Heart Rate (Exit) 67 bpm       Rating of Perceived Exertion (Exercise) 11       Symptoms none       Duration Continue with 45 min of aerobic exercise without signs/symptoms of physical distress.       Intensity THRR unchanged         Progression   Progression Continue to progress workloads to maintain intensity without signs/symptoms of physical distress.       Average METs 2.91         Resistance Training   Training Prescription Yes       Weight 3 lb       Reps 10-15         Interval Training   Interval Training No         Treadmill   MPH 1.7       Grade 1       Minutes 15       METs 2.54         Recumbant Bike   Level 5       Watts 29       Minutes 15       METs 3.18         NuStep   Level 5       Minutes 15       METs 3         Home Exercise Plan   Plans to continue exercise at Home (comment)  walk       Frequency Add 3 additional days to program exercise sessions.       Initial  Home Exercises Provided 10/25/16          Exercise Comments:     Exercise Comments    Row Name 10/09/16 1040           Exercise Comments  First full day of exercise!  Patient was oriented to gym and equipment including functions, settings, policies, and procedures.  Patient's individual exercise prescription and treatment plan were reviewed.  All starting workloads were established based on the results of the 6 minute walk test done at initial orientation visit.  The plan for exercise progression was also introduced and progression will be customized based on patient's performance and goals.          Exercise Goals and Review:     Exercise Goals    Row Name 10/01/16 1420             Exercise Goals   Increase Physical Activity Yes       Intervention Provide advice, education, support and counseling about physical activity/exercise needs.;Develop an individualized exercise prescription for aerobic and resistive training based on initial evaluation findings, risk stratification, comorbidities and participant's personal goals.       Expected Outcomes Achievement of increased cardiorespiratory fitness and enhanced flexibility, muscular endurance and strength shown through measurements of functional capacity and personal statement of participant.       Increase Strength and Stamina Yes       Intervention Provide advice, education, support and counseling about physical activity/exercise needs.;Develop an individualized exercise prescription for aerobic and resistive training based on initial  evaluation findings, risk stratification, comorbidities and participant's personal goals.       Expected Outcomes Achievement of increased cardiorespiratory fitness and enhanced flexibility, muscular endurance and strength shown through measurements of functional capacity and personal statement of participant.          Exercise Goals Re-Evaluation :     Exercise Goals Re-Evaluation    Row Name  10/11/16 1125 10/23/16 1428 10/25/16 1013 11/07/16 1518 11/21/16 1512     Exercise Goal Re-Evaluation   Exercise Goals Review Increase Physical Activity;Increase Strenth and Stamina Increase Physical Activity;Increase Strenth and Stamina Increase Physical Activity;Increase Strenth and Stamina Increase Physical Activity;Increase Strenth and Stamina Increase Physical Activity;Increase Strenth and Stamina   Comments Victor Gould has completed his first full week of exercise!!  He is off to a good start and we will continue to monitor his progression. Victor Gould has been doing well in rehab.  He is up to 10 Watts on the recumbent bike.  We will continue to monitor his progression. Victor Gould has been doing about 10-92mn of walking at home each day.  We talked about increasing his home exercise to help increase his stamina and strength.  So we reviewed home exercise with pt today.  Pt plans to walk and use recumbent bike at home for exercise.  Reviewed THR, pulse, RPE, sign and symptoms, and when to call 911 or MD.  Also discussed weather considerations and indoor options.  Pt voiced understanding. Pt is tolerating exercise well.   BRush Landmarkhas been doing well in exercise.  He is now up to level 4 on the NuStep.  We will continue to monitor his progression.   Expected Outcomes Short and Long: Continue to come to class to work on increasing phsyicial activity. Short: Begin to increase workloads.  Long: Continue to work on increasing physcial activity. Short: Increase exercise time at home.  Long: [place] to increase physcial activity daily. Short - BRush Landmarkwill complete HT program.  Long - BRush Landmarkwill continue to exercise independently. Short: Increase speed on treadmill.  Long: Continue to exercise independently.   [place] 11/22/16 0130808/07/18 0927 12/07/16 0755         Exercise Goal Re-Evaluation   Exercise Goals Review Increase Physical Activity;Increase Strenth and Stamina Increase Physical Activity;Increase Strenth and Stamina  Increase Physical Activity;Increase Strenth and Stamina     Comments BRush Landmarkhas enjoyed rehab.  He was able to move up again on his equipment today.  He is gaining confidence and getting stronger.  He is now able to go up and down the stairs at home with getting as SOB.   Improved post 6WMT by 200 ft! BRush Landmarkhas been doing well in rehab.  He is now up to level 5 on the NuStep.  He will be graduating next week!  He plans to continue to walk at home.     Expected Outcomes Short: Continue to increase workloads.  Long: Increase exercise at home.   - Short: Graduation!!  Long: Continue to exercise independently.        Discharge Exercise Prescription (Final Exercise Prescription Changes):     Exercise Prescription Changes - 12/07/16 0700      Response to Exercise   Blood Pressure (Admit) 148/64   Blood Pressure (Exercise) 142/76   Blood Pressure (Exit) 136/82   Heart Rate (Admit) 78 bpm   Heart Rate (Exercise) 97 bpm   Heart Rate (Exit) 67 bpm   Rating of Perceived Exertion (Exercise) 11   Symptoms none  Duration Continue with 45 min of aerobic exercise without signs/symptoms of physical distress.   Intensity THRR unchanged     Progression   Progression Continue to progress workloads to maintain intensity without signs/symptoms of physical distress.   Average METs 2.91     Resistance Training   Training Prescription Yes   Weight 3 lb   Reps 10-15     Interval Training   Interval Training No     Treadmill   MPH 1.7   Grade 1   Minutes 15   METs 2.54     Recumbant Bike   Level 5   Watts 29   Minutes 15   METs 3.18     NuStep   Level 5   Minutes 15   METs 3     Home Exercise Plan   Plans to continue exercise at Home (comment)  walk   Frequency Add 3 additional days to program exercise sessions.   Initial Home Exercises Provided 10/25/16      Nutrition:  Target Goals: Understanding of nutrition guidelines, daily intake of sodium <1583m, cholesterol <2099m calories  30% from fat and 7% or less from saturated fats, daily to have 5 or more servings of fruits and vegetables.  Biometrics:     Pre Biometrics - 10/01/16 1420      Pre Biometrics   Height 5' 8.3" (1.735 m)   Weight 174 lb (78.9 kg)   Waist Circumference 35 inches   Hip Circumference 38 inches   Waist to Hip Ratio 0.92 %   BMI (Calculated) 26.3   Single Leg Stand 1.81 seconds         Post Biometrics - 12/04/16 0928       Post  Biometrics   Height 5' 8.3" (1.735 m)   Weight 175 lb (79.4 kg)   Waist Circumference 35 inches   Hip Circumference 38 inches   Waist to Hip Ratio 0.92 %   BMI (Calculated) 26.4   Single Leg Stand 8.61 seconds      Nutrition Therapy Plan and Nutrition Goals:     Nutrition Therapy & Goals - 11/22/16 1103      Nutrition Therapy   RD appointment defered Yes      Nutrition Discharge: Rate Your Plate Scores:     Nutrition Assessments - 12/11/16 0849      MEDFICTS Scores   Pre Score 41   Post Score 53   Score Difference 12      Nutrition Goals Re-Evaluation:     Nutrition Goals Re-Evaluation    Row Name 10/25/16 0852 11/22/16 1103           Goals   Comment declined appointment with nutrtionist continues to decline appointment.   Eating out 1-2 times a week and following good nutrition at home.      Expected Outcome maintain heart healthy diet maintain heart healthy diet         Nutrition Goals Discharge (Final Nutrition Goals Re-Evaluation):     Nutrition Goals Re-Evaluation - 11/22/16 1103      Goals   Comment continues to decline appointment.   Eating out 1-2 times a week and following good nutrition at home.   Expected Outcome maintain heart healthy diet      Psychosocial: Target Goals: Acknowledge presence or absence of significant depression and/or stress, maximize coping skills, provide positive support system. Participant is able to verbalize types and ability to use techniques and skills needed for reducing stress  and  depression.   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      Quality of Life Scores:      Quality of Life - 12/11/16 0842      Quality of Life Scores   Health/Function Pre 25.29 %   Health/Function Post 17.83 %   Health/Function % Change -29.5 %   Socioeconomic Pre 30 %   Socioeconomic Post 20.86 %   Socioeconomic % Change  -30.47 %   Psych/Spiritual Pre 29.14 %   Psych/Spiritual Post 23.43 %   Psych/Spiritual % Change -19.6 %   Family Pre 30 %   Family Post 19.5 %   Family % Change -35 %   GLOBAL Pre 27.75 %   GLOBAL Post 19.85 %   GLOBAL % Change -28.47 %      PHQ-9: Recent Review Flowsheet Data    Depression screen [facility] 2/9 12/11/2016 10/01/2016   Decreased Interest 1 0   Down, Depressed, Hopeless 1 0   PHQ - 2 Score 2 0   Altered sleeping 1 3   Tired, decreased energy 2 3   Change in appetite 1 0   Feeling bad or failure about yourself  0 0   Trouble concentrating 1 0   Moving slowly or fidgety/restless 1 0   Suicidal thoughts 0 0   PHQ-9 Score 8 6   Difficult doing work/chores Somewhat difficult Somewhat difficult     Interpretation of Total Score  Total Score Depression Severity:  1-4 = Minimal depression, 5-9 = Mild depression, 10-14 = Moderate depression, 15-19 = Moderately severe depression, 20-27 = Severe depression   Psychosocial Evaluation and Intervention:     Psychosocial Evaluation - 11/06/16 0928      Psychosocial Evaluation & Interventions   Interventions Encouraged to exercise with the program and follow exercise prescription;Relaxation education   Comments Counselor met with [facility]) today for initial psychosocial evaluation.  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  He has a strong support system with a spouse of 82 years and a son who lives close  by - as well as active involvement in his local church community.  Victor Gould is a diabetic in addition to his heart health issues.  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  He gets maybe 4 hours sleep typically.  He has a good appetite.  Victor Gould denies a history of depression or anxiety and states he is typically positive with minimal stress in his life.  His goals for this program are to increase his stamina and strength and be able to climb the stairs without shortness of breath.  Counselor encouraged Bill to check with his Dr. or pharmacist about possibly considering an OTC natural sleep aid to increase his amount and quality of sleep.  Staff will be following with Bill throughout the course of this program.     Expected Outcomes Victor Gould will benefit from consistent exercise to achieve his stated goals.  He will also benefit from the psychoeducational components of this program to help him relax and possibly experience more quality sleep.  Staff will follow.   Continue Psychosocial Services  Follow up required by staff      Psychosocial Re-Evaluation:  Psychosocial Re-Evaluation    [place] Name 10/25/16 1022 11/22/16 1103           Psychosocial Re-Evaluation   Current issues with  - Current Sleep Concerns;Current Stress Concerns      Comments Victor Gould does not acknowledge any major stressor in his life.  He is generally positive.  He is working to get his strength and stamina back. He does note some trouble sleeping due to frequent trips to the bathroom at night. Victor Gould has been doing well in rehab.  He is able to do more at home and is now doing the laudry for his wife and working on his antique car.  He is staying mainly positive overall.  He is worried about how they will fair once his wife has knee surgery in Sept, but was reassured when we discussed his lifting resistrictions will be done after next week. which is his three month surgery anniversary.  He has tried  the melatonin per Kathy's suggestion and it has help tremendously!  However, he is still getting up at night to go to the bathroom frequently.  We talked about having a discussion wiht his doctor to see if he could change the time of day that he is taking his fluid pill to try to et a better quality of sleep.      Expected Outcomes Short: Maintain positive outlook.  Long: Continue to exercise to build strength and stamina. Short: Talk to doctor about changing time to take fluid pill.  Long: Continue to exercise and maintain positive outlook      Interventions Stress management education;Encouraged to attend Cardiac Rehabilitation for the exercise Stress management education;Encouraged to attend Cardiac Rehabilitation for the exercise      Continue Psychosocial Services  Follow up required by staff Follow up required by staff        Initial Review   Source of Stress Concerns  - Family;Chronic Illness         Psychosocial Discharge (Final Psychosocial Re-Evaluation):     Psychosocial Re-Evaluation - 11/22/16 1103      Psychosocial Re-Evaluation   Current issues with Current Sleep Concerns;Current Stress Concerns   Comments Victor Gould has been doing well in rehab.  He is able to do more at home and is now doing the laudry for his wife and working on his antique car.  He is staying mainly positive overall.  He is worried about how they will fair once his wife has knee surgery in Sept, but was reassured when we discussed his lifting resistrictions will be done after next week. which is his three month surgery anniversary.  He has tried the melatonin per Kathy's suggestion and it has help tremendously!  However, he is still getting up at night to go to the bathroom frequently.  We talked about having a discussion wiht his doctor to see if he could change the time of day that he is taking his fluid pill to try to et a better quality of sleep.   Expected Outcomes Short: Talk to doctor about changing time to take  fluid pill.  Long: Continue to exercise and maintain positive outlook   Interventions Stress management education;Encouraged to attend Cardiac Rehabilitation for the exercise   Continue Psychosocial Services  Follow up required by staff     Initial Review   Source of Stress Concerns Family;Chronic Illness      Vocational Rehabilitation: Provide vocational rehab assistance to qualifying candidates.   Vocational Rehab Evaluation & Intervention:  Vocational Rehab - 10/01/16 1333      Initial Vocational Rehab Evaluation & Intervention   Assessment shows need for Vocational Rehabilitation No      Education: Education Goals: Education classes will be provided on a weekly basis, covering required topics. Participant will state understanding/return demonstration of topics presented.  Learning Barriers/Preferences:     Learning Barriers/Preferences - 10/01/16 1333      Learning Barriers/Preferences   Learning Barriers Hearing   Learning Preferences None      Education Topics: General Nutrition Guidelines/Fats and Fiber: -Group instruction provided by verbal, written material, models and posters to present the general guidelines for heart healthy nutrition. Gives an explanation and review of dietary fats and fiber.   Cardiac Rehab from 12/11/2016 in [facility] Cardiac and Pulmonary Rehab  Date  10/22/16  Educator  CR  Instruction Review Code  2- meets goals/outcomes      Controlling Sodium/Reading Food Labels: -Group verbal and written material supporting the discussion of sodium use in heart healthy nutrition. Review and explanation with models, verbal and written materials for utilization of the food label.   Cardiac Rehab from 12/11/2016 in [facility] Cardiac and Pulmonary Rehab  Date  10/30/16  Educator  PI  Instruction Review Code  2- meets goals/outcomes      Exercise Physiology & Risk Factors: - Group verbal and written instruction with models to review the exercise  physiology of the cardiovascular system and associated critical values. Details cardiovascular disease risk factors and the goals associated with each risk factor.   Cardiac Rehab from 12/11/2016 in [facility] Cardiac and Pulmonary Rehab  Date  11/06/16  Educator  AS/RG  Instruction Review Code  2- meets goals/outcomes      Aerobic Exercise & Resistance Training: - Gives group verbal and written discussion on the health impact of inactivity. On the components of aerobic and resistive training programs and the benefits of this training and how to safely progress through these programs.   Cardiac Rehab from 12/11/2016 in [facility] Cardiac and Pulmonary Rehab  Date  11/08/16  Educator  AS  Instruction Review Code  2- meets goals/outcomes      Flexibility, Balance, General Exercise Guidelines: - Provides group verbal and written instruction on the benefits of flexibility and balance training programs. Provides general exercise guidelines with specific guidelines to those with heart or lung disease. Demonstration and skill practice provided.   Cardiac Rehab from 12/11/2016 in [facility] Cardiac and Pulmonary Rehab  Date  11/13/16  Educator  AS  Instruction Review Code  2- meets goals/outcomes      Stress Management: - Provides group verbal and written instruction about the health risks of elevated stress, cause of high stress, and healthy ways to reduce stress.   Cardiac Rehab from 12/11/2016 in [facility] Cardiac and Pulmonary Rehab  Date  11/20/16  Educator  [facility]  Instruction Review Code  2- meets goals/outcomes      Depression: - Provides group verbal and written instruction on the correlation between heart/lung disease and depressed mood, treatment options, and the stigmas associated with seeking treatment.   Cardiac Rehab from 12/11/2016 in [facility] Cardiac and Pulmonary Rehab  Date  11/01/16  Educator  [facility]  Instruction Review Code  2- meets goals/outcomes      Anatomy & Physiology of the Heart: - Group  verbal and written instruction and models provide basic cardiac anatomy and physiology, with the coronary electrical and arterial systems. Review of: AMI, Angina, Valve disease, Heart Failure, Cardiac Arrhythmia, Pacemakers,  and the ICD.   Cardiac Rehab from 12/11/2016 in [facility] Cardiac and Pulmonary Rehab  Date  11/22/16  Educator  [facility]  Instruction Review Code  2- meets goals/outcomes      Cardiac Procedures: - Group verbal and written instruction and models to describe the testing methods done to diagnose heart disease. Reviews the outcomes of the test results. Describes the treatment choices: Medical Management, Angioplasty, or Coronary Bypass Surgery.   Cardiac Rehab from 12/11/2016 in [facility] Cardiac and Pulmonary Rehab  Date  11/27/16  Educator  CE  Instruction Review Code  2- meets goals/outcomes      Cardiac Medications: - Group verbal and written instruction to review commonly prescribed medications for heart disease. Reviews the medication, class of the drug, and side effects. Includes the steps to properly store meds and maintain the prescription regimen.   Cardiac Rehab from 12/11/2016 in [facility] Cardiac and Pulmonary Rehab  Date  12/04/16 [6/12 Part One  6/14 Part Two]  Educator  SB [8/7 Part One 8/9 Part TWO]  Instruction Review Code  2- meets goals/outcomes      Go Sex-Intimacy & Heart Disease, Get SMART - Goal Setting: - Group verbal and written instruction through game format to discuss heart disease and the return to sexual intimacy. Provides group verbal and written material to discuss and apply goal setting through the application of the S.M.A.R.T. Method.   Cardiac Rehab from 12/11/2016 in [facility] Cardiac and Pulmonary Rehab  Date  11/27/16  Educator  CE  Instruction Review Code  2- meets goals/outcomes      Other Matters of the Heart: - Provides group verbal, written materials and models to describe Heart Failure, Angina, Valve Disease, and Diabetes in the realm of heart  disease. Includes description of the disease process and treatment options available to the cardiac patient.   Cardiac Rehab from 12/11/2016 in [facility] Cardiac and Pulmonary Rehab  Date  11/22/16  Educator  [facility]  Instruction Review Code  2- meets goals/outcomes      Exercise & Equipment Safety: - Individual verbal instruction and demonstration of equipment use and safety with use of the equipment.   Cardiac Rehab from 12/11/2016 in [facility] Cardiac and Pulmonary Rehab  Date  10/01/16  Educator  Sb  Instruction Review Code  2- meets goals/outcomes      Infection Prevention: - Provides verbal and written material to individual with discussion of infection control including proper hand washing and proper equipment cleaning during exercise session.   Cardiac Rehab from 12/11/2016 in [facility] Cardiac and Pulmonary Rehab  Date  10/01/16  Educator  Sb  Instruction Review Code  2- meets goals/outcomes      Falls Prevention: - Provides verbal and written material to individual with discussion of falls prevention and safety.   Cardiac Rehab from 12/11/2016 in [facility] Cardiac and Pulmonary Rehab  Date  10/01/16  Educator  SB  Instruction Review Code  2- meets goals/outcomes      Diabetes: - Individual verbal and written instruction to review signs/symptoms of diabetes, desired ranges of glucose level fasting, after meals and with exercise. Advice that pre and post exercise glucose checks will be done for 3 sessions at entry of program.   Cardiac Rehab from 12/11/2016 in [facility] Cardiac and Pulmonary Rehab  Date  10/01/16  Educator  SB  Instruction Review Code  2- meets goals/outcomes       Knowledge Questionnaire Score:     Knowledge Questionnaire Score - 12/11/16 3267  Knowledge Questionnaire Score   Pre Score 18/28   Post Score 22/28      Core Components/Risk Factors/Patient Goals at Admission:     Personal Goals and Risk Factors at Admission - 10/01/16 1324      Core Components/Risk  Factors/Patient Goals on Admission    Weight Management Yes;Weight Maintenance   Intervention Weight Management: Develop a combined nutrition and exercise program designed to reach desired caloric intake, while maintaining appropriate intake of nutrient and fiber, sodium and fats, and appropriate energy expenditure required for the weight goal.   Admit Weight 174 lb (78.9 kg)   Goal Weight: Short Term 172 lb (78 kg)   Goal Weight: Long Term 170 lb (77.1 kg)   Expected Outcomes Short Term: Continue to assess and modify interventions until short term weight is achieved;Long Term: Adherence to nutrition and physical activity/exercise program aimed toward attainment of established weight goal;Weight Maintenance: Understanding of the daily nutrition guidelines, which includes 25-35% calories from fat, 7% or less cal from saturated fats, less than 243m cholesterol, less than 1.5gm of sodium, & 5 or more servings of fruits and vegetables daily   Diabetes Yes   Intervention Provide education about signs/symptoms and action to take for hypo/hyperglycemia.;Provide education about proper nutrition, including hydration, and aerobic/resistive exercise prescription along with prescribed medications to achieve blood glucose in normal ranges: Fasting glucose 65-99 mg/dL   Expected Outcomes Short Term: Participant verbalizes understanding of the signs/symptoms and immediate care of hyper/hypoglycemia, proper foot care and importance of medication, aerobic/resistive exercise and nutrition plan for blood glucose control.;Long Term: Attainment of HbA1C < 7%.  LAst A1C was 7.7%   Hypertension Yes   Intervention Provide education on lifestyle modifcations including regular physical activity/exercise, weight management, moderate sodium restriction and increased consumption of fresh fruit, vegetables, and low fat dairy, alcohol moderation, and smoking cessation.;Monitor prescription use compliance.   Expected Outcomes Short  Term: Continued assessment and intervention until BP is < 140/96mHG in hypertensive participants. < 130/8063mG in hypertensive participants with diabetes, heart failure or chronic kidney disease.;Long Term: Maintenance of blood pressure at goal levels.   Lipids Yes   Intervention Provide education and support for participant on nutrition & aerobic/resistive exercise along with prescribed medications to achieve LDL <59m30mDL >40mg44medication started recently   Expected Outcomes Short Term: Participant states understanding of desired cholesterol values and is compliant with medications prescribed. Participant is following exercise prescription and nutrition guidelines.;Long Term: Cholesterol controlled with medications as prescribed, with individualized exercise RX and with personalized nutrition plan. Value goals: LDL < 59mg,50m > 40 mg.      Core Components/Risk Factors/Patient Goals Review:      Goals and Risk Factor Review    Row Name 10/25/16 1015 11/22/16 1059           Core Components/Risk Factors/Patient Goals Review   Personal Goals Review Weight Management/Obesity;Hypertension;Lipids Weight Management/Obesity;Hypertension;Lipids;Diabetes      Review Bill's weight is starting to trend back up as his appetite has finally recovered since surgery.  His blood pressures have been up and down.  His peak was 194 at home; he rested for awhile and it started to come down.  He had talked to his doctor and was told to take an extra pill when this happened..  He Marland Kitchens also good at checking his blood sugars and they are up and down as well. He does check them at least three times a day and any time that he feels  low.   Despite this, he declined an appointment with the nutrtionist.  Victor Gould has not had any problems with his statin medication. Bill's weight has been fairly steady only varying by 1-2 lbs.  He has had good blood pressure reading he and at home.  They even decreased he BP medication which  seems to be helping.  Blood sugars continue to all over the place as it depends on what he is eating and whether or not he is working outside as well.   He continues to check it regularly.  He has not had any problems with his medication.  He is going to talk with his doctor about changing the time of day that he takes his fluid pill.      Expected Outcomes Short: Stay on top of weight to avoid overeating and gaining too much back.  Long: Work on better blood pressure and blood sugar management. Short: Talk about changing time for fluid pill. Long: Continue to work on blood sugar control.         Core Components/Risk Factors/Patient Goals at Discharge (Final Review):      Goals and Risk Factor Review - 11/22/16 1059      Core Components/Risk Factors/Patient Goals Review   Personal Goals Review Weight Management/Obesity;Hypertension;Lipids;Diabetes   Review Bill's weight has been fairly steady only varying by 1-2 lbs.  He has had good blood pressure reading he and at home.  They even decreased he BP medication which seems to be helping.  Blood sugars continue to all over the place as it depends on what he is eating and whether or not he is working outside as well.   He continues to check it regularly.  He has not had any problems with his medication.  He is going to talk with his doctor about changing the time of day that he takes his fluid pill.   Expected Outcomes Short: Talk about changing time for fluid pill. Long: Continue to work on blood sugar control.      ITP Comments:     ITP Comments    Row Name 10/01/16 1316 10/17/16 0544 11/14/16 0649 12/12/16 0622     ITP Comments Medical Review Completed today. Initial ITP created. Diaagnosis documnetation can be found in [facility] 08/25/2016 Admission 30 day review. Continue with ITP unless directed changes per Medical Director review.  New to program 30 day review. Continue with ITP unless directed changes per Medical Director review    30 day review.  Continue with ITP unless directed changes per Medical Director review        Comments:

## 2016-12-13 ENCOUNTER — Encounter: Payer: Medicare Other | Admitting: *Deleted

## 2016-12-13 DIAGNOSIS — Z951 Presence of aortocoronary bypass graft: Secondary | ICD-10-CM | POA: Diagnosis not present

## 2016-12-13 NOTE — Progress Notes (Signed)
Daily Session Note  Patient Details  Name: ADILSON GRAFTON MRN: [id number] Date of Birth: [birth year] Referring Provider:     Cardiac Rehab from 10/01/2016 in [facility] Cardiac and Pulmonary Rehab  Referring Provider  Ida Rogue MD      Encounter Date: 12/13/2016  Check In:     Session Check In - 12/13/16 0853      Check-In   Location [facility]-Cardiac & Pulmonary Rehab   Staff Present Alberteen Sam, MA, ACSM RCEP, Exercise Physiologist;Amanda Oletta Darter, BA, ACSM CEP, Exercise Physiologist;Meredith Sherryll Burger, RN BSN   Supervising physician immediately available to respond to emergencies See telemetry face sheet for immediately available ER MD   Medication changes reported     No   Fall or balance concerns reported    No   Warm-up and Cool-down Performed on first and last piece of equipment   Resistance Training Performed Yes   VAD Patient? No     Pain Assessment   Currently in Pain? No/denies   Multiple Pain Sites No         History  Smoking Status  . Never Smoker  Smokeless Tobacco  . Never Used    Goals Met:  Independence with exercise equipment Exercise tolerated well Personal goals reviewed No report of cardiac concerns or symptoms Strength training completed today  Goals Unmet:  Not Applicable  Comments: Pt able to follow exercise prescription today without complaint.  Will continue to monitor for progression.  Wyatte graduated today from cardiac rehab with 36 sessions completed.  Details of the patient's exercise prescription and what He needs to do in order to continue the prescription and progress were discussed with patient.  Patient was given a copy of prescription and goals.  Patient verbalized understanding.  Williom plans to continue to exercise by walking and using recumbent bike at home.    Dr. [name] is Medical Director for [place] [facility].

## 2016-12-13 NOTE — Progress Notes (Signed)
Cardiac Individual Treatment Plan  Patient Details  Name: Victor Gould MRN: [id number] Date of Birth: 1941/03/08 Referring Provider:     Cardiac Rehab from 10/01/2016 in [facility] Cardiac and Pulmonary Rehab  Referring Provider  Ida Rogue MD      Initial Encounter Date:    Cardiac Rehab from 10/01/2016 in [facility] Cardiac and Pulmonary Rehab  Date  10/01/16  Referring Provider  Ida Rogue MD      Visit Diagnosis: S/P CABG x 5  Patient's Home Medications on Admission:  Current Outpatient Prescriptions:  .  acetaminophen (TYLENOL) 500 MG tablet, Take 1,000 mg by mouth daily as needed for headache (pain)., Disp: , Rfl:  .  amiodarone (PACERONE) 200 MG tablet, Take 1 tablet (200 mg total) by mouth every 12 (twelve) hours. For 2 days then take Amiodarone 200 mg by mouth daily thereafter., Disp: 30 tablet, Rfl: 1 .  aspirin EC 325 MG EC tablet, Take 1 tablet (325 mg total) by mouth daily., Disp: 30 tablet, Rfl: 0 .  atorvastatin (LIPITOR) 40 MG tablet, Take 1 tablet (40 mg total) by mouth daily at 6 PM., Disp: 30 tablet, Rfl: 1 .  b complex vitamins tablet, Take 1 tablet by mouth daily after breakfast., Disp: , Rfl:  .  cholecalciferol (VITAMIN D) 1000 units tablet, Take 1,000 Units by mouth daily after breakfast., Disp: , Rfl:  .  clopidogrel (PLAVIX) 75 MG tablet, Take 1 tablet (75 mg total) by mouth daily., Disp: 90 tablet, Rfl: 3 .  hydrocortisone cream 1 %, Apply 1 application topically daily as needed for itching., Disp: , Rfl:  .  insulin glargine (LANTUS) 100 unit/mL SOPN, Inject 17 Units into the skin daily before breakfast., Disp: , Rfl:  .  insulin lispro (HUMALOG KWIKPEN) 100 UNIT/ML KiwkPen, Inject 7-10 Units into the skin See admin instructions. Inject 7 units subcutaneously before breakfast, lunch and dinner - plus adjustment for CBG 140-180 add 1 unit, 180-220 add 3 units, >220 add 3 units, Disp: , Rfl:  .  levothyroxine (SYNTHROID, LEVOTHROID) 75 MCG tablet, Take 75 mcg  by mouth daily before breakfast., Disp: , Rfl:  .  losartan (COZAAR) 25 MG tablet, Take 1 tablet (25 mg total) by mouth daily after breakfast., Disp: 30 tablet, Rfl: 1 .  metoprolol tartrate (LOPRESSOR) 25 MG tablet, Take 1 tablet (25 mg total) by mouth 2 (two) times daily., Disp: 60 tablet, Rfl: 1 .  Multiple Vitamin (MULTIVITAMIN WITH MINERALS) TABS tablet, Take 1 tablet by mouth daily after breakfast., Disp: , Rfl:  .  omeprazole (PRILOSEC) 20 MG capsule, Take 20 mg by mouth daily before breakfast. , Disp: , Rfl:  .  OVER THE COUNTER MEDICATION, Place 1 drop into both eyes daily as needed (dry eyes). Over the counter lubricating eye drops - must be preservative free, Disp: , Rfl:   Past Medical History: Past Medical History:  Diagnosis Date  . Cancer [facility])    prostate  . Diabetes mellitus   . GERD (gastroesophageal reflux disease)   . Hemorrhoids   . Hypertension    PCP [name] at [facility]  . PONV (postoperative nausea and vomiting)   . Thyroid disease     Tobacco Use: History  Smoking Status  . Never Smoker  Smokeless Tobacco  . Never Used    Labs: Recent Review Flowsheet Data    Labs for ITP Cardiac and Pulmonary Rehab Latest Ref Rng & Units 08/28/2016 08/29/2016 08/29/2016 08/29/2016 08/29/2016   Cholestrol 0 -  200 mg/dL - - - - -   LDLCALC 0 - 99 mg/dL - - - - -   HDL >40 mg/dL - - - - -   Trlycerides <150 mg/dL - - - - -   Hemoglobin A1c 4.8 - 5.6 % - - - - -   PHART 7.350 - 7.450 7.361 7.420 7.418 7.391 -   PCO2ART 32.0 - 48.0 mmHg 32.7 30.2(L) 29.9(L) 30.5(L) -   HCO3 20.0 - 28.0 mmol/L 18.5(L) 19.6(L) 19.3(L) 18.5(L) -   TCO2 0 - 100 mmol/L '20 21 20 19 23   ' ACIDBASEDEF 0.0 - 2.0 mmol/L 6.0(H) 4.0(H) 4.0(H) 6.0(H) -   O2SAT % 93.0 97.0 94.0 93.0 -       Exercise Target Goals:    Exercise Program Goal: Individual exercise prescription set with THRR, safety & activity barriers. Participant demonstrates ability to understand and report RPE using BORG scale, to  self-measure pulse accurately, and to acknowledge the importance of the exercise prescription.  Exercise Prescription Goal: Starting with aerobic activity 30 plus minutes a day, 3 days per week for initial exercise prescription. Provide home exercise prescription and guidelines that participant acknowledges understanding prior to discharge.  Activity Barriers & Risk Stratification:     Activity Barriers & Cardiac Risk Stratification - 10/01/16 1320      Activity Barriers & Cardiac Risk Stratification   Activity Barriers Deconditioning;Muscular Weakness;Balance Concerns   Cardiac Risk Stratification High      6 Minute Walk:     6 Minute Walk    Row Name 10/01/16 1417 12/04/16 0923       6 Minute Walk   Phase Initial Discharge    Distance 1100 feet 1300 feet    Distance % Change  - 18.1 %  200 ft    Walk Time 6 minutes 6 minutes    # of Rest Breaks 0 0    MPH 2.08 2.46    METS 2.01 2.55    RPE 11 9    VO2 Peak 7.04 8.94    Symptoms No No    Resting HR 54 bpm 63 bpm    Resting BP 136/66 140/62    Max Ex. HR 68 bpm 90 bpm    Max Ex. BP 142/70 146/70    2 Minute Post BP 146/70  recheck 126 74  -       Oxygen Initial Assessment:   Oxygen Re-Evaluation:   Oxygen Discharge (Final Oxygen Re-Evaluation):   Initial Exercise Prescription:     Initial Exercise Prescription - 10/01/16 1400      Date of Initial Exercise RX and Referring Provider   Date 10/01/16   Referring Provider Ida Rogue MD     Treadmill   MPH 1.3   Grade 0   Minutes 15   METs 2     Recumbant Bike   Level 1   RPM 40   Watts 10   Minutes 15   METs 2.39     NuStep   Level 1   SPM 80   Minutes 15   METs 2     Prescription Details   Frequency (times per week) 2   Duration Progress to 45 minutes of aerobic exercise without signs/symptoms of physical distress     Intensity   THRR 40-80% of Max Heartrate 90-125   Ratings of Perceived Exertion 11-15   Perceived Dyspnea 0-4      Progression   Progression Continue to progress workloads to maintain intensity without  signs/symptoms of physical distress.     Resistance Training   Training Prescription Yes   Weight 3 lbs   Reps 10-15      Perform Capillary Blood Glucose checks as needed.  Exercise Prescription Changes:     Exercise Prescription Changes    Row Name 10/01/16 1300 10/11/16 1100 10/23/16 1400 11/07/16 1500 11/21/16 1500     Response to Exercise   Blood Pressure (Admit) 136/66 126/64 126/64 152/72 138/74   Blood Pressure (Exercise) 148/70 124/64 126/60 134/72 150/74   Blood Pressure (Exit) 146/70 124/64 138/66 132/66 138/76   Heart Rate (Admit) 54 bpm 69 bpm 82 bpm 67 bpm 80 bpm   Heart Rate (Exercise) 68 bpm 91 bpm 107 bpm 88 bpm 99 bpm   Heart Rate (Exit) 57 bpm 58 bpm 56 bpm 59 bpm 63 bpm   Oxygen Saturation (Admit) 100 %  -  -  -  -   Oxygen Saturation (Exercise) 96 %  -  -  -  -   Rating of Perceived Exertion (Exercise) '11 13 12 11 12   ' Symptoms none none none none none   Comments walk test results only second day of exercise  -  -  -   Duration  - Progress to 45 minutes of aerobic exercise without signs/symptoms of physical distress Continue with 45 min of aerobic exercise without signs/symptoms of physical distress. Continue with 45 min of aerobic exercise without signs/symptoms of physical distress. Continue with 45 min of aerobic exercise without signs/symptoms of physical distress.   Intensity  - THRR unchanged THRR unchanged THRR unchanged THRR unchanged     Progression   Progression  - Continue to progress workloads to maintain intensity without signs/symptoms of physical distress. Continue to progress workloads to maintain intensity without signs/symptoms of physical distress. Continue to progress workloads to maintain intensity without signs/symptoms of physical distress. Continue to progress workloads to maintain intensity without signs/symptoms of physical distress.   Average METs   - 2.16 2.29 2.2 2.56     Resistance Training   Training Prescription  - Yes Yes Yes Yes   Weight  - 3 lbs 3 lbs 3 lb 3 lb   Reps  - 10-15 10-15 10-15 10-15     Interval Training   Interval Training  - No No No No     Treadmill   MPH  - 1.3 1.3 1.3 1.3   Grade  - 0 0 0 0   Minutes  - '15 15 15 15   ' METs  - '2 2 2 2     ' Recumbant Bike   Level  - '1 3 3 3   ' Watts  - '13 10 10 25   ' Minutes  - '15 15 15 15   ' METs  - 2.39 2.39 2.39 2.87     NuStep   Level  - 1 1  - 4   Minutes  - 15 15  - 15   METs  - 2.1 2.5  - 2.7     Home Exercise Plan   Plans to continue exercise at  -  -  -  - Home (comment)  walk   Frequency  -  -  -  - Add 3 additional days to program exercise sessions.   Initial Home Exercises Provided  -  -  -  - 10/25/16   Row Name 12/07/16 0700             Response to Exercise  Blood Pressure (Admit) 148/64       Blood Pressure (Exercise) 142/76       Blood Pressure (Exit) 136/82       Heart Rate (Admit) 78 bpm       Heart Rate (Exercise) 97 bpm       Heart Rate (Exit) 67 bpm       Rating of Perceived Exertion (Exercise) 11       Symptoms none       Duration Continue with 45 min of aerobic exercise without signs/symptoms of physical distress.       Intensity THRR unchanged         Progression   Progression Continue to progress workloads to maintain intensity without signs/symptoms of physical distress.       Average METs 2.91         Resistance Training   Training Prescription Yes       Weight 3 lb       Reps 10-15         Interval Training   Interval Training No         Treadmill   MPH 1.7       Grade 1       Minutes 15       METs 2.54         Recumbant Bike   Level 5       Watts 29       Minutes 15       METs 3.18         NuStep   Level 5       Minutes 15       METs 3         Home Exercise Plan   Plans to continue exercise at Home (comment)  walk       Frequency Add 3 additional days to program exercise sessions.       Initial  Home Exercises Provided 10/25/16          Exercise Comments:     Exercise Comments    Row Name 10/09/16 1040 12/13/16 0858         Exercise Comments  First full day of exercise!  Patient was oriented to gym and equipment including functions, settings, policies, and procedures.  Patient's individual exercise prescription and treatment plan were reviewed.  All starting workloads were established based on the results of the 6 minute walk test done at initial orientation visit.  The plan for exercise progression was also introduced and progression will be customized based on patient's performance and goals.  Victor Gould graduated today from cardiac rehab with 36 sessions completed.  Details of the patient's exercise prescription and what He needs to do in order to continue the prescription and progress were discussed with patient.  Patient was given a copy of prescription and goals.  Patient verbalized understanding.  Victor Gould plans to continue to exercise by walking and using recumbent bike at home.         Exercise Goals and Review:     Exercise Goals    Row Name 10/01/16 1420             Exercise Goals   Increase Physical Activity Yes       Intervention Provide advice, education, support and counseling about physical activity/exercise needs.;Develop an individualized exercise prescription for aerobic and resistive training based on initial evaluation findings, risk stratification, comorbidities and participant's personal goals.       Expected  Outcomes Achievement of increased cardiorespiratory fitness and enhanced flexibility, muscular endurance and strength shown through measurements of functional capacity and personal statement of participant.       Increase Strength and Stamina Yes       Intervention Provide advice, education, support and counseling about physical activity/exercise needs.;Develop an individualized exercise prescription for aerobic and resistive training based on initial  evaluation findings, risk stratification, comorbidities and participant's personal goals.       Expected Outcomes Achievement of increased cardiorespiratory fitness and enhanced flexibility, muscular endurance and strength shown through measurements of functional capacity and personal statement of participant.          Exercise Goals Re-Evaluation :     Exercise Goals Re-Evaluation    Row Name 10/11/16 1125 10/23/16 1428 10/25/16 1013 11/07/16 1518 11/21/16 1512     Exercise Goal Re-Evaluation   Exercise Goals Review Increase Physical Activity;Increase Strenth and Stamina Increase Physical Activity;Increase Strenth and Stamina Increase Physical Activity;Increase Strenth and Stamina Increase Physical Activity;Increase Strenth and Stamina Increase Physical Activity;Increase Strenth and Stamina   Comments Victor Gould has completed his first full week of exercise!!  He is off to a good start and we will continue to monitor his progression. Victor Gould has been doing well in rehab.  He is up to 10 Watts on the recumbent bike.  We will continue to monitor his progression. Victor Gould has been doing about 10-45mn of walking at home each day.  We talked about increasing his home exercise to help increase his stamina and strength.  So we reviewed home exercise with pt today.  Pt plans to walk and use recumbent bike at home for exercise.  Reviewed THR, pulse, RPE, sign and symptoms, and when to call 911 or MD.  Also discussed weather considerations and indoor options.  Pt voiced understanding. Pt is tolerating exercise well.   BRush Landmarkhas been doing well in exercise.  He is now up to level 4 on the NuStep.  We will continue to monitor his progression.   Expected Outcomes Short and Long: Continue to come to class to work on increasing phsyicial activity. Short: Begin to increase workloads.  Long: Continue to work on increasing physcial activity. Short: Increase exercise time at home.  Long: Contine to increase physcial activity daily.  Short - BRush Landmarkwill complete HT program.  Long - BRush Landmarkwill continue to exercise independently. Short: Increase speed on treadmill.  Long: Continue to exercise independently.   RMontrealName 11/22/16 0017708/07/18 0927 12/07/16 0755         Exercise Goal Re-Evaluation   Exercise Goals Review Increase Physical Activity;Increase Strenth and Stamina Increase Physical Activity;Increase Strenth and Stamina Increase Physical Activity;Increase Strenth and Stamina     Comments BRush Landmarkhas enjoyed rehab.  He was able to move up again on his equipment today.  He is gaining confidence and getting stronger.  He is now able to go up and down the stairs at home with getting as SOB.   Improved post 6WMT by 200 ft! BRush Landmarkhas been doing well in rehab.  He is now up to level 5 on the NuStep.  He will be graduating next week!  He plans to continue to walk at home.     Expected Outcomes Short: Continue to increase workloads.  Long: Increase exercise at home.   - Short: Graduation!!  Long: Continue to exercise independently.        Discharge Exercise Prescription (Final Exercise Prescription Changes):     Exercise Prescription  Changes - 12/07/16 0700      Response to Exercise   Blood Pressure (Admit) 148/64   Blood Pressure (Exercise) 142/76   Blood Pressure (Exit) 136/82   Heart Rate (Admit) 78 bpm   Heart Rate (Exercise) 97 bpm   Heart Rate (Exit) 67 bpm   Rating of Perceived Exertion (Exercise) 11   Symptoms none   Duration Continue with 45 min of aerobic exercise without signs/symptoms of physical distress.   Intensity THRR unchanged     Progression   Progression Continue to progress workloads to maintain intensity without signs/symptoms of physical distress.   Average METs 2.91     Resistance Training   Training Prescription Yes   Weight 3 lb   Reps 10-15     Interval Training   Interval Training No     Treadmill   MPH 1.7   Grade 1   Minutes 15   METs 2.54     Recumbant Bike   Level 5   Watts  29   Minutes 15   METs 3.18     NuStep   Level 5   Minutes 15   METs 3     Home Exercise Plan   Plans to continue exercise at Home (comment)  walk   Frequency Add 3 additional days to program exercise sessions.   Initial Home Exercises Provided 10/25/16      Nutrition:  Target Goals: Understanding of nutrition guidelines, daily intake of sodium <1534m, cholesterol <2012m calories 30% from fat and 7% or less from saturated fats, daily to have 5 or more servings of fruits and vegetables.  Biometrics:     Pre Biometrics - 10/01/16 1420      Pre Biometrics   Height 5' 8.3" (1.735 m)   Weight 174 lb (78.9 kg)   Waist Circumference 35 inches   Hip Circumference 38 inches   Waist to Hip Ratio 0.92 %   BMI (Calculated) 26.3   Single Leg Stand 1.81 seconds         Post Biometrics - 12/04/16 0928       Post  Biometrics   Height 5' 8.3" (1.735 m)   Weight 175 lb (79.4 kg)   Waist Circumference 35 inches   Hip Circumference 38 inches   Waist to Hip Ratio 0.92 %   BMI (Calculated) 26.4   Single Leg Stand 8.61 seconds      Nutrition Therapy Plan and Nutrition Goals:     Nutrition Therapy & Goals - 11/22/16 1103      Nutrition Therapy   RD appointment defered Yes      Nutrition Discharge: Rate Your Plate Scores:     Nutrition Assessments - 12/11/16 0849      MEDFICTS Scores   Pre Score 41   Post Score 53   Score Difference 12      Nutrition Goals Re-Evaluation:     Nutrition Goals Re-Evaluation    Row Name 10/25/16 0852 11/22/16 1103           Goals   Comment declined appointment with nutrtionist continues to decline appointment.   Eating out 1-2 times a week and following good nutrition at home.      Expected Outcome maintain heart healthy diet maintain heart healthy diet         Nutrition Goals Discharge (Final Nutrition Goals Re-Evaluation):     Nutrition Goals Re-Evaluation - 11/22/16 1103      Goals   Comment continues to decline  appointment.   Eating out 1-2 times a week and following good nutrition at home.   Expected Outcome maintain heart healthy diet      Psychosocial: Target Goals: Acknowledge presence or absence of significant depression and/or stress, maximize coping skills, provide positive support system. Participant is able to verbalize types and ability to use techniques and skills needed for reducing stress and depression.   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      Quality of Life Scores:      Quality of Life - 12/11/16 0842      Quality of Life Scores   Health/Function Pre 25.29 %   Health/Function Post 17.83 %   Health/Function % Change -29.5 %   Socioeconomic Pre 30 %   Socioeconomic Post 20.86 %   Socioeconomic % Change  -30.47 %   Psych/Spiritual Pre 29.14 %   Psych/Spiritual Post 23.43 %   Psych/Spiritual % Change -19.6 %   Family Pre 30 %   Family Post 19.5 %   Family % Change -35 %   GLOBAL Pre 27.75 %   GLOBAL Post 19.85 %   GLOBAL % Change -28.47 %      PHQ-9: Recent Review Flowsheet Data    Depression screen [facility] 2/9 12/11/2016 10/01/2016   Decreased Interest 1 0   Down, Depressed, Hopeless 1 0   PHQ - 2 Score 2 0   Altered sleeping 1 3   Tired, decreased energy 2 3   Change in appetite 1 0   Feeling bad or failure about yourself  0 0   Trouble concentrating 1 0   Moving slowly or fidgety/restless 1 0   Suicidal thoughts 0 0   PHQ-9 Score 8 6   Difficult doing work/chores Somewhat difficult Somewhat difficult     Interpretation of Total Score  Total Score Depression Severity:  1-4 = Minimal depression, 5-9 = Mild depression, 10-14 = Moderate depression, 15-19 = Moderately severe depression, 20-27 = Severe depression   Psychosocial Evaluation and Intervention:     Psychosocial Evaluation - 11/06/16 0928       Psychosocial Evaluation & Interventions   Interventions Encouraged to exercise with the program and follow exercise prescription;Relaxation education   Comments Counselor met with Victor Gould [facility]) today for initial psychosocial evaluation.  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  He has a strong support system with a spouse of 32 years and a son who lives close by - as well as active involvement in his local church community.  Victor Gould is a diabetic in addition to his heart health issues.  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  He gets maybe 4 hours sleep typically.  He has a good appetite.  Victor Gould denies a history of depression or anxiety and states he is typically positive with minimal stress in his life.  His goals for this program are to increase his stamina and strength and be able to climb the stairs without shortness of breath.  Counselor encouraged Victor Gould to check with his Dr. or pharmacist about possibly considering an OTC natural sleep aid to increase his amount and quality of sleep.  Staff will be following with Victor Gould throughout the course of  this program.     Expected Outcomes Victor Gould will benefit from consistent exercise to achieve his stated goals.  He will also benefit from the psychoeducational components of this program to help him relax and possibly experience more quality sleep.  Staff will follow.   Continue Psychosocial Services  Follow up required by staff      Psychosocial Re-Evaluation:     Psychosocial Re-Evaluation    [place] Name 10/25/16 1022 11/22/16 1103           Psychosocial Re-Evaluation   Current issues with  - Current Sleep Concerns;Current Stress Concerns      Comments Victor Gould does not acknowledge any major stressor in his life.  He is generally positive.  He is working to get his strength and stamina back. He does note some trouble sleeping due to frequent trips to the bathroom at night. Victor Gould  has been doing well in rehab.  He is able to do more at home and is now doing the laudry for his wife and working on his antique car.  He is staying mainly positive overall.  He is worried about how they will fair once his wife has knee surgery in Sept, but was reassured when we discussed his lifting resistrictions will be done after next week. which is his three month surgery anniversary.  He has tried the melatonin per Kathy's suggestion and it has help tremendously!  However, he is still getting up at night to go to the bathroom frequently.  We talked about having a discussion wiht his doctor to see if he could change the time of day that he is taking his fluid pill to try to et a better quality of sleep.      Expected Outcomes Short: Maintain positive outlook.  Long: Continue to exercise to build strength and stamina. Short: Talk to doctor about changing time to take fluid pill.  Long: Continue to exercise and maintain positive outlook      Interventions Stress management education;Encouraged to attend Cardiac Rehabilitation for the exercise Stress management education;Encouraged to attend Cardiac Rehabilitation for the exercise      Continue Psychosocial Services  Follow up required by staff Follow up required by staff        Initial Review   Source of Stress Concerns  - Family;Chronic Illness         Psychosocial Discharge (Final Psychosocial Re-Evaluation):     Psychosocial Re-Evaluation - 11/22/16 1103      Psychosocial Re-Evaluation   Current issues with Current Sleep Concerns;Current Stress Concerns   Comments Victor Gould has been doing well in rehab.  He is able to do more at home and is now doing the laudry for his wife and working on his antique car.  He is staying mainly positive overall.  He is worried about how they will fair once his wife has knee surgery in Sept, but was reassured when we discussed his lifting resistrictions will be done after next week. which is his three month surgery  anniversary.  He has tried the melatonin per Kathy's suggestion and it has help tremendously!  However, he is still getting up at night to go to the bathroom frequently.  We talked about having a discussion wiht his doctor to see if he could change the time of day that he is taking his fluid pill to try to et a better quality of sleep.   Expected Outcomes Short: Talk to doctor about changing time to take fluid pill.  Long: Continue  to exercise and maintain positive outlook   Interventions Stress management education;Encouraged to attend Cardiac Rehabilitation for the exercise   Continue Psychosocial Services  Follow up required by staff     Initial Review   Source of Stress Concerns Family;Chronic Illness      Vocational Rehabilitation: Provide vocational rehab assistance to qualifying candidates.   Vocational Rehab Evaluation & Intervention:     Vocational Rehab - 10/01/16 1333      Initial Vocational Rehab Evaluation & Intervention   Assessment shows need for Vocational Rehabilitation No      Education: Education Goals: Education classes will be provided on a weekly basis, covering required topics. Participant will state understanding/return demonstration of topics presented.  Learning Barriers/Preferences:     Learning Barriers/Preferences - 10/01/16 1333      Learning Barriers/Preferences   Learning Barriers Hearing   Learning Preferences None      Education Topics: General Nutrition Guidelines/Fats and Fiber: -Group instruction provided by verbal, written material, models and posters to present the general guidelines for heart healthy nutrition. Gives an explanation and review of dietary fats and fiber.   Cardiac Rehab from 12/13/2016 in [facility] Cardiac and Pulmonary Rehab  Date  10/22/16  Educator  CR  Instruction Review Code  2- meets goals/outcomes      Controlling Sodium/Reading Food Labels: -Group verbal and written material supporting the discussion of sodium  use in heart healthy nutrition. Review and explanation with models, verbal and written materials for utilization of the food label.   Cardiac Rehab from 12/13/2016 in [facility] Cardiac and Pulmonary Rehab  Date  10/30/16  Educator  PI  Instruction Review Code  2- meets goals/outcomes      Exercise Physiology & Risk Factors: - Group verbal and written instruction with models to review the exercise physiology of the cardiovascular system and associated critical values. Details cardiovascular disease risk factors and the goals associated with each risk factor.   Cardiac Rehab from 12/13/2016 in [facility] Cardiac and Pulmonary Rehab  Date  11/06/16  Educator  AS/RG  Instruction Review Code  2- meets goals/outcomes      Aerobic Exercise & Resistance Training: - Gives group verbal and written discussion on the health impact of inactivity. On the components of aerobic and resistive training programs and the benefits of this training and how to safely progress through these programs.   Cardiac Rehab from 12/13/2016 in [facility] Cardiac and Pulmonary Rehab  Date  11/08/16  Educator  AS  Instruction Review Code  2- meets goals/outcomes      Flexibility, Balance, General Exercise Guidelines: - Provides group verbal and written instruction on the benefits of flexibility and balance training programs. Provides general exercise guidelines with specific guidelines to those with heart or lung disease. Demonstration and skill practice provided.   Cardiac Rehab from 12/13/2016 in [facility] Cardiac and Pulmonary Rehab  Date  11/13/16  Educator  AS  Instruction Review Code  2- meets goals/outcomes      Stress Management: - Provides group verbal and written instruction about the health risks of elevated stress, cause of high stress, and healthy ways to reduce stress.   Cardiac Rehab from 12/13/2016 in [facility] Cardiac and Pulmonary Rehab  Date  11/20/16  Educator  [facility]  Instruction Review Code  2- meets goals/outcomes       Depression: - Provides group verbal and written instruction on the correlation between heart/lung disease and depressed mood, treatment options, and the stigmas associated with seeking treatment.  Cardiac Rehab from 12/13/2016 in [facility] Cardiac and Pulmonary Rehab  Date  12/13/16  Educator  [facility]  Instruction Review Code  2- meets goals/outcomes      Anatomy & Physiology of the Heart: - Group verbal and written instruction and models provide basic cardiac anatomy and physiology, with the coronary electrical and arterial systems. Review of: AMI, Angina, Valve disease, Heart Failure, Cardiac Arrhythmia, Pacemakers, and the ICD.   Cardiac Rehab from 12/13/2016 in [facility] Cardiac and Pulmonary Rehab  Date  11/22/16  Educator  [facility]  Instruction Review Code  2- meets goals/outcomes      Cardiac Procedures: - Group verbal and written instruction and models to describe the testing methods done to diagnose heart disease. Reviews the outcomes of the test results. Describes the treatment choices: Medical Management, Angioplasty, or Coronary Bypass Surgery.   Cardiac Rehab from 12/13/2016 in [facility] Cardiac and Pulmonary Rehab  Date  11/27/16  Educator  CE  Instruction Review Code  2- meets goals/outcomes      Cardiac Medications: - Group verbal and written instruction to review commonly prescribed medications for heart disease. Reviews the medication, class of the drug, and side effects. Includes the steps to properly store meds and maintain the prescription regimen.   Cardiac Rehab from 12/13/2016 in [facility] Cardiac and Pulmonary Rehab  Date  12/04/16 [6/12 Part One  6/14 Part Two]  Educator  SB [8/7 Part One 8/9 Part TWO]  Instruction Review Code  2- meets goals/outcomes      Go Sex-Intimacy & Heart Disease, Get SMART - Goal Setting: - Group verbal and written instruction through game format to discuss heart disease and the return to sexual intimacy. Provides group verbal and written material to  discuss and apply goal setting through the application of the S.M.A.R.T. Method.   Cardiac Rehab from 12/13/2016 in [facility] Cardiac and Pulmonary Rehab  Date  11/27/16  Educator  CE  Instruction Review Code  2- meets goals/outcomes      Other Matters of the Heart: - Provides group verbal, written materials and models to describe Heart Failure, Angina, Valve Disease, and Diabetes in the realm of heart disease. Includes description of the disease process and treatment options available to the cardiac patient.   Cardiac Rehab from 12/13/2016 in [facility] Cardiac and Pulmonary Rehab  Date  11/22/16  Educator  [facility]  Instruction Review Code  2- meets goals/outcomes      Exercise & Equipment Safety: - Individual verbal instruction and demonstration of equipment use and safety with use of the equipment.   Cardiac Rehab from 12/13/2016 in [facility] Cardiac and Pulmonary Rehab  Date  10/01/16  Educator  Sb  Instruction Review Code  2- meets goals/outcomes      Infection Prevention: - Provides verbal and written material to individual with discussion of infection control including proper hand washing and proper equipment cleaning during exercise session.   Cardiac Rehab from 12/13/2016 in [facility] Cardiac and Pulmonary Rehab  Date  10/01/16  Educator  Sb  Instruction Review Code  2- meets goals/outcomes      Falls Prevention: - Provides verbal and written material to individual with discussion of falls prevention and safety.   Cardiac Rehab from 12/13/2016 in [facility] Cardiac and Pulmonary Rehab  Date  10/01/16  Educator  SB  Instruction Review Code  2- meets goals/outcomes      Diabetes: - Individual verbal and written instruction to review signs/symptoms of diabetes, desired ranges of glucose level fasting, after meals and  with exercise. Advice that pre and post exercise glucose checks will be done for 3 sessions at entry of program.   Cardiac Rehab from 12/13/2016 in [facility] Cardiac and Pulmonary Rehab  Date   10/01/16  Educator  SB  Instruction Review Code  2- meets goals/outcomes       Knowledge Questionnaire Score:     Knowledge Questionnaire Score - 12/11/16 0902      Knowledge Questionnaire Score   Pre Score 18/28   Post Score 22/28      Core Components/Risk Factors/Patient Goals at Admission:     Personal Goals and Risk Factors at Admission - 10/01/16 1324      Core Components/Risk Factors/Patient Goals on Admission    Weight Management Yes;Weight Maintenance   Intervention Weight Management: Develop a combined nutrition and exercise program designed to reach desired caloric intake, while maintaining appropriate intake of nutrient and fiber, sodium and fats, and appropriate energy expenditure required for the weight goal.   Admit Weight 174 lb (78.9 kg)   Goal Weight: Short Term 172 lb (78 kg)   Goal Weight: Long Term 170 lb (77.1 kg)   Expected Outcomes Short Term: Continue to assess and modify interventions until short term weight is achieved;Long Term: Adherence to nutrition and physical activity/exercise program aimed toward attainment of established weight goal;Weight Maintenance: Understanding of the daily nutrition guidelines, which includes 25-35% calories from fat, 7% or less cal from saturated fats, less than 269m cholesterol, less than 1.5gm of sodium, & 5 or more servings of fruits and vegetables daily   Diabetes Yes   Intervention Provide education about signs/symptoms and action to take for hypo/hyperglycemia.;Provide education about proper nutrition, including hydration, and aerobic/resistive exercise prescription along with prescribed medications to achieve blood glucose in normal ranges: Fasting glucose 65-99 mg/dL   Expected Outcomes Short Term: Participant verbalizes understanding of the signs/symptoms and immediate care of hyper/hypoglycemia, proper foot care and importance of medication, aerobic/resistive exercise and nutrition plan for blood glucose  control.;Long Term: Attainment of HbA1C < 7%.  LAst A1C was 7.7%   Hypertension Yes   Intervention Provide education on lifestyle modifcations including regular physical activity/exercise, weight management, moderate sodium restriction and increased consumption of fresh fruit, vegetables, and low fat dairy, alcohol moderation, and smoking cessation.;Monitor prescription use compliance.   Expected Outcomes Short Term: Continued assessment and intervention until BP is < 140/939mHG in hypertensive participants. < 130/8073mG in hypertensive participants with diabetes, heart failure or chronic kidney disease.;Long Term: Maintenance of blood pressure at goal levels.   Lipids Yes   Intervention Provide education and support for participant on nutrition & aerobic/resistive exercise along with prescribed medications to achieve LDL <73m17mDL >40mg33medication started recently   Expected Outcomes Short Term: Participant states understanding of desired cholesterol values and is compliant with medications prescribed. Participant is following exercise prescription and nutrition guidelines.;Long Term: Cholesterol controlled with medications as prescribed, with individualized exercise RX and with personalized nutrition plan. Value goals: LDL < 73mg,18m > 40 mg.      Core Components/Risk Factors/Patient Goals Review:      Goals and Risk Factor Review    Row Name 10/25/16 1015 11/22/16 1059           Core Components/Risk Factors/Patient Goals Review   Personal Goals Review Weight Management/Obesity;Hypertension;Lipids Weight Management/Obesity;Hypertension;Lipids;Diabetes      Review Victor Gould's weight is starting to trend back up as his appetite has finally recovered since surgery.  His blood pressures have been  up and down.  His peak was 194 at home; he rested for awhile and it started to come down.  He had talked to his doctor and was told to take an extra pill when this happened.Marland Kitchen  He is also good at  checking his blood sugars and they are up and down as well. He does check them at least three times a day and any time that he feels low.   Despite this, he declined an appointment with the nutrtionist.  Victor Gould has not had any problems with his statin medication. Victor Gould's weight has been fairly steady only varying by 1-2 lbs.  He has had good blood pressure reading he and at home.  They even decreased he BP medication which seems to be helping.  Blood sugars continue to all over the place as it depends on what he is eating and whether or not he is working outside as well.   He continues to check it regularly.  He has not had any problems with his medication.  He is going to talk with his doctor about changing the time of day that he takes his fluid pill.      Expected Outcomes Short: Stay on top of weight to avoid overeating and gaining too much back.  Long: Work on better blood pressure and blood sugar management. Short: Talk about changing time for fluid pill. Long: Continue to work on blood sugar control.         Core Components/Risk Factors/Patient Goals at Discharge (Final Review):      Goals and Risk Factor Review - 11/22/16 1059      Core Components/Risk Factors/Patient Goals Review   Personal Goals Review Weight Management/Obesity;Hypertension;Lipids;Diabetes   Review Victor Gould's weight has been fairly steady only varying by 1-2 lbs.  He has had good blood pressure reading he and at home.  They even decreased he BP medication which seems to be helping.  Blood sugars continue to all over the place as it depends on what he is eating and whether or not he is working outside as well.   He continues to check it regularly.  He has not had any problems with his medication.  He is going to talk with his doctor about changing the time of day that he takes his fluid pill.   Expected Outcomes Short: Talk about changing time for fluid pill. Long: Continue to work on blood sugar control.      ITP Comments:      ITP Comments    Row Name 10/01/16 1316 10/17/16 0544 11/14/16 0649 12/12/16 0622     ITP Comments Medical Review Completed today. Initial ITP created. Diaagnosis documnetation can be found in [facility] 08/25/2016 Admission 30 day review. Continue with ITP unless directed changes per Medical Director review.  New to program 30 day review. Continue with ITP unless directed changes per Medical Director review    30 day review. Continue with ITP unless directed changes per Medical Director review        Comments: Discharge ITP

## 2016-12-13 NOTE — Progress Notes (Signed)
Discharge Summary  Patient Details  Name: Victor Gould MRN: [id number] Date of Birth: 23-Feb-1941 Referring Provider:     Cardiac Rehab from 10/01/2016 in [facility] Cardiac and Pulmonary Rehab  Referring Provider  Ida Rogue MD       Number of Visits: 36/36  Reason for Discharge:  Patient reached a stable level of exercise. Patient independent in their exercise.  Smoking History:  History  Smoking Status  . Never Smoker  Smokeless Tobacco  . Never Used    Diagnosis:  S/P CABG x 5  ADL UCSD:   Initial Exercise Prescription:     Initial Exercise Prescription - 10/01/16 1400      Date of Initial Exercise RX and Referring Provider   Date 10/01/16   Referring Provider Ida Rogue MD     Treadmill   MPH 1.3   Grade 0   Minutes 15   METs 2     Recumbant Bike   Level 1   RPM 40   Watts 10   Minutes 15   METs 2.39     NuStep   Level 1   SPM 80   Minutes 15   METs 2     Prescription Details   Frequency (times per week) 2   Duration Progress to 45 minutes of aerobic exercise without signs/symptoms of physical distress     Intensity   THRR 40-80% of Max Heartrate 90-125   Ratings of Perceived Exertion 11-15   Perceived Dyspnea 0-4     Progression   Progression Continue to progress workloads to maintain intensity without signs/symptoms of physical distress.     Resistance Training   Training Prescription Yes   Weight 3 lbs   Reps 10-15      Discharge Exercise Prescription (Final Exercise Prescription Changes):     Exercise Prescription Changes - 12/07/16 0700      Response to Exercise   Blood Pressure (Admit) 148/64   Blood Pressure (Exercise) 142/76   Blood Pressure (Exit) 136/82   Heart Rate (Admit) 78 bpm   Heart Rate (Exercise) 97 bpm   Heart Rate (Exit) 67 bpm   Rating of Perceived Exertion (Exercise) 11   Symptoms none   Duration Continue with 45 min of aerobic exercise without signs/symptoms of physical distress.   Intensity  THRR unchanged     Progression   Progression Continue to progress workloads to maintain intensity without signs/symptoms of physical distress.   Average METs 2.91     Resistance Training   Training Prescription Yes   Weight 3 lb   Reps 10-15     Interval Training   Interval Training No     Treadmill   MPH 1.7   Grade 1   Minutes 15   METs 2.54     Recumbant Bike   Level 5   Watts 29   Minutes 15   METs 3.18     NuStep   Level 5   Minutes 15   METs 3     Home Exercise Plan   Plans to continue exercise at Home (comment)  walk   Frequency Add 3 additional days to program exercise sessions.   Initial Home Exercises Provided 10/25/16      Functional Capacity:     6 Minute Walk    Row Name 10/01/16 1417 12/04/16 0923       6 Minute Walk   Phase Initial Discharge    Distance 1100 feet 1300 feet    Distance %  Change  - 18.1 %  200 ft    Walk Time 6 minutes 6 minutes    # of Rest Breaks 0 0    MPH 2.08 2.46    METS 2.01 2.55    RPE 11 9    VO2 Peak 7.04 8.94    Symptoms No No    Resting HR 54 bpm 63 bpm    Resting BP 136/66 140/62    Max Ex. HR 68 bpm 90 bpm    Max Ex. BP 142/70 146/70    2 Minute Post BP 146/70  recheck 126 74  -       Psychological, QOL, Others - Outcomes: PHQ 2/9: Depression screen [facility] 2/9 12/11/2016 10/01/2016  Decreased Interest 1 0  Down, Depressed, Hopeless 1 0  PHQ - 2 Score 2 0  Altered sleeping 1 3  Tired, decreased energy 2 3  Change in appetite 1 0  Feeling bad or failure about yourself  0 0  Trouble concentrating 1 0  Moving slowly or fidgety/restless 1 0  Suicidal thoughts 0 0  PHQ-9 Score 8 6  Difficult doing work/chores Somewhat difficult Somewhat difficult    Quality of Life:     Quality of Life - 12/11/16 0842      Quality of Life Scores   Health/Function Pre 25.29 %   Health/Function Post 17.83 %   Health/Function % Change -29.5 %   Socioeconomic Pre 30 %   Socioeconomic Post 20.86 %   Socioeconomic  % Change  -30.47 %   Psych/Spiritual Pre 29.14 %   Psych/Spiritual Post 23.43 %   Psych/Spiritual % Change -19.6 %   Family Pre 30 %   Family Post 19.5 %   Family % Change -35 %   GLOBAL Pre 27.75 %   GLOBAL Post 19.85 %   GLOBAL % Change -28.47 %      Personal Goals: Goals established at orientation with interventions provided to work toward goal.     Personal Goals and Risk Factors at Admission - 10/01/16 1324      Core Components/Risk Factors/Patient Goals on Admission    Weight Management Yes;Weight Maintenance   Intervention Weight Management: Develop a combined nutrition and exercise program designed to reach desired caloric intake, while maintaining appropriate intake of nutrient and fiber, sodium and fats, and appropriate energy expenditure required for the weight goal.   Admit Weight 174 lb (78.9 kg)   Goal Weight: Short Term 172 lb (78 kg)   Goal Weight: Long Term 170 lb (77.1 kg)   Expected Outcomes Short Term: Continue to assess and modify interventions until short term weight is achieved;Long Term: Adherence to nutrition and physical activity/exercise program aimed toward attainment of established weight goal;Weight Maintenance: Understanding of the daily nutrition guidelines, which includes 25-35% calories from fat, 7% or less cal from saturated fats, less than 200mg  cholesterol, less than 1.5gm of sodium, & 5 or more servings of fruits and vegetables daily   Diabetes Yes   Intervention Provide education about signs/symptoms and action to take for hypo/hyperglycemia.;Provide education about proper nutrition, including hydration, and aerobic/resistive exercise prescription along with prescribed medications to achieve blood glucose in normal ranges: Fasting glucose 65-99 mg/dL   Expected Outcomes Short Term: Participant verbalizes understanding of the signs/symptoms and immediate care of hyper/hypoglycemia, proper foot care and importance of medication, aerobic/resistive  exercise and nutrition plan for blood glucose control.;Long Term: Attainment of HbA1C < 7%.  LAst A1C was 7.7%   Hypertension Yes  Intervention Provide education on lifestyle modifcations including regular physical activity/exercise, weight management, moderate sodium restriction and increased consumption of fresh fruit, vegetables, and low fat dairy, alcohol moderation, and smoking cessation.;Monitor prescription use compliance.   Expected Outcomes Short Term: Continued assessment and intervention until BP is < 140/37mm HG in hypertensive participants. < 130/41mm HG in hypertensive participants with diabetes, heart failure or chronic kidney disease.;Long Term: Maintenance of blood pressure at goal levels.   Lipids Yes   Intervention Provide education and support for participant on nutrition & aerobic/resistive exercise along with prescribed medications to achieve LDL 70mg , HDL >40mg .  Medication started recently   Expected Outcomes Short Term: Participant states understanding of desired cholesterol values and is compliant with medications prescribed. Participant is following exercise prescription and nutrition guidelines.;Long Term: Cholesterol controlled with medications as prescribed, with individualized exercise RX and with personalized nutrition plan. Value goals: LDL < 70mg , HDL > 40 mg.       Personal Goals Discharge:     Goals and Risk Factor Review    Row Name 10/25/16 1015 11/22/16 1059           Core Components/Risk Factors/Patient Goals Review   Personal Goals Review Weight Management/Obesity;Hypertension;Lipids Weight Management/Obesity;Hypertension;Lipids;Diabetes      Review Bill's weight is starting to trend back up as his appetite has finally recovered since surgery.  His blood pressures have been up and down.  His peak was 194 at home; he rested for awhile and it started to come down.  He had talked to his doctor and was told to take an extra pill when this happened.Marland Kitchen  He  is also good at checking his blood sugars and they are up and down as well. He does check them at least three times a day and any time that he feels low.   Despite this, he declined an appointment with the nutrtionist.  Rush [facility]mark has not had any problems with his statin medication. Bill's weight has been fairly steady only varying by 1-2 lbs.  He has had good blood pressure reading he and at home.  They even decreased he BP medication which seems to be helping.  Blood sugars continue to all over the place as it depends on what he is eating and whether or not he is working outside as well.   He continues to check it regularly.  He has not had any problems with his medication.  He is going to talk with his doctor about changing the time of day that he takes his fluid pill.      Expected Outcomes Short: Stay on top of weight to avoid overeating and gaining too much back.  Long: Work on better blood pressure and blood sugar management. Short: Talk about changing time for fluid pill. Long: Continue to work on blood sugar control.         Nutrition & Weight - Outcomes:     Pre Biometrics - 10/01/16 1420      Pre Biometrics   Height 5' 8.3" (1.735 m)   Weight 174 lb (78.9 kg)   Waist Circumference 35 inches   Hip Circumference 38 inches   Waist to Hip Ratio 0.92 %   BMI (Calculated) 26.3   Single Leg Stand 1.81 seconds         Post Biometrics - 12/04/16 0928       Post  Biometrics   Height 5' 8.3" (1.735 m)   Weight 175 lb (79.4 kg)   Waist Circumference 35 inches  Hip Circumference 38 inches   Waist to Hip Ratio 0.92 %   BMI (Calculated) 26.4   Single Leg Stand 8.61 seconds      Nutrition:     Nutrition Therapy & Goals - 11/22/16 1103      Nutrition Therapy   RD appointment defered Yes      Nutrition Discharge:     Nutrition Assessments - 12/11/16 0849      MEDFICTS Scores   Pre Score 41   Post Score 53   Score Difference 12      Education Questionnaire Score:      Knowledge Questionnaire Score - 12/11/16 0902      Knowledge Questionnaire Score   Pre Score 18/28   Post Score 22/28      Goals reviewed with patient; copy given to patient.

## 2016-12-23 DIAGNOSIS — I5032 Chronic diastolic (congestive) heart failure: Secondary | ICD-10-CM | POA: Insufficient documentation

## 2016-12-23 DIAGNOSIS — I25118 Atherosclerotic heart disease of native coronary artery with other forms of angina pectoris: Secondary | ICD-10-CM | POA: Insufficient documentation

## 2016-12-23 NOTE — Progress Notes (Signed)
Cardiology Office Note  Date:  12/24/2016   ID:  Victor, Gould 10-20-1940, MRN [id number]  PCP:  Rusty Aus, MD   Chief Complaint  Patient presents with  . other    C/o chest pain. Meds reviewed verbally with pt.    HPI:  Victor Gould is a pleasant 76 year old gentleman with past medical history of CKD, creatinine 2.1 Diabetes, hemoglobin A1c 7.0 S/p NSTEMI.  s/p CABG x 5 on 08/25/16 PAF -  Essential hypertension H/O prostate cancer S/P Radical Prostatectomy  Chronic diastolic CHF - LVEF 19-50% Who presents by referral from Dr. Sabra Heck for coronary disease, bypass surgery  Hospital Records reviewed with him in detail Cath  Severe three-vessel coronary disease with total occlusion of the proximal LAD after the first diagonal, high-grade obstruction with superimposed thrombus in the large first diagonal which collateralizes the distal LAD, 80-90% obstruction in a small to moderate ramus intermedius, 70% stenosis and small first obtuse marginal, greater than 90% obstruction in the ostium to proximal PDA and 70% obstruction in the ongoing right coronary before a large left ventricular branch.  Coronary artery bypass grafting x5 with the left internal mammary to the left anterior descending coronary artery, reverse saphenous vein graft to the diagonal coronary artery, reverse saphenous vein graft to the obtuse marginal coronary artery, sequential reverse saphenous vein graft to the posterior descending and posterior branches of the right coronary artery  Since the surgery he reports recently developing pain in his chest when he sits up from a supine position. Pain is clearly positional. Admits to overdoing it at times, works on cars Active at baseline, able to walk 15-20 minutes without discomfort Tolerating his medications Had to decrease metoprolol 25 twice a day secondary to fatigue Now is only taking 12.5 mg daily  Total cholesterol 108 on  Lipitor 40  EKG personally  reviewed by myself on todays visit Shows normal sinus rhythm rate 66 bpm no significant ST or T-wave changes   PMH:   has a past medical history of Cancer ([place]); Coronary artery disease; Diabetes mellitus; GERD (gastroesophageal reflux disease); Hemorrhoids; Hyperlipidemia; Hypertension; MI (myocardial infarction) ([place]); PONV (postoperative nausea and vomiting); and Thyroid disease.  PSH:    Past Surgical History:  Procedure Laterality Date  . APPENDECTOMY    . colonoscopy with polypectomy    . CORONARY ARTERY BYPASS GRAFT N/A 08/28/2016   Procedure: CORONARY ARTERY BYPASS GRAFTING (CABG) x five, using left internal mammary artery and right leg greater saphenous vein harvested endoscopically - -LIMA to LAD, -SVG to OM, -SVG to DIAGONAL, -SEQ SVG to PDA, PLVB ;  Surgeon: [name], MD;  Location: [facility];  Service: Open Heart Surgery;  Laterality: N/A;  . CYSTOSCOPY N/A 09/29/2013   Procedure: CYSTOSCOPY;  Surgeon: [name], MD;  [facility];  Service: Urology;  Laterality: N/A;  . HERNIA REPAIR     inguinal bilaterally  . LEFT HEART CATH AND CORONARY ANGIOGRAPHY N/A 08/27/2016   Procedure: Left Heart Cath and Coronary Angiography;  Surgeon: [name], MD;  Location: [facility];  Service: Cardiovascular;  Laterality: N/A;  . ROBOT ASSISTED LAPAROSCOPIC RADICAL PROSTATECTOMY  06/25/2011   Procedure: ROBOTIC ASSISTED LAPAROSCOPIC RADICAL PROSTATECTOMY LEVEL 2;  Surgeon: [name], MD;  [facility];  Service: Urology;  Laterality: N/A;  . TEE WITHOUT CARDIOVERSION N/A 08/28/2016   Procedure: TRANSESOPHAGEAL ECHOCARDIOGRAM (TEE);  Surgeon: [name], MD;  Location: [facility];  Service: Open Heart Surgery;  Laterality: N/A;  . URETHRAL SLING N/A 09/29/2013   Procedure: MALE SLING;  Surgeon: [name], MD;  [facility];  Service: Urology;  Laterality: N/A;    Current Outpatient Prescriptions  Medication Sig Dispense Refill  . acetaminophen  (TYLENOL) 500 MG tablet Take 1,000 mg by mouth daily as needed for headache (pain).    Marland Kitchen amiodarone (PACERONE) 200 MG tablet Take 1 tablet (200 mg total) by mouth every 12 (twelve) hours. For 2 days then take Amiodarone 200 mg by mouth daily thereafter. 30 tablet 1  . aspirin EC 81 MG tablet Take 81 mg by mouth daily.    Marland Kitchen atorvastatin (LIPITOR) 40 MG tablet Take 1 tablet (40 mg total) by mouth daily at 6 PM. 30 tablet 1  . b complex vitamins tablet Take 1 tablet by mouth daily after breakfast.    . cholecalciferol (VITAMIN D) 1000 units tablet Take 1,000 Units by mouth daily after breakfast.    . clopidogrel (PLAVIX) 75 MG tablet Take 1 tablet (75 mg total) by mouth daily. 90 tablet 3  . hydrocortisone cream 1 % Apply 1 application topically daily as needed for itching.    . insulin glargine (LANTUS) 100 unit/mL SOPN Inject 17 Units into the skin daily before breakfast.    . insulin lispro (HUMALOG KWIKPEN) 100 UNIT/ML KiwkPen Inject 7-10 Units into the skin See admin instructions. Inject 7 units subcutaneously before breakfast, lunch and dinner - plus adjustment for CBG 140-180 add 1 unit, 180-220 add 3 units, >220 add 3 units    . levothyroxine (SYNTHROID, LEVOTHROID) 75 MCG tablet Take 75 mcg by mouth daily before breakfast.    . losartan (COZAAR) 25 MG tablet Take 1 tablet (25 mg total) by mouth daily after breakfast. 30 tablet 1  . metoprolol tartrate (LOPRESSOR) 25 MG tablet Take 1 tablet (25 mg total) by mouth 2 (two) times daily. 60 tablet 1  . Multiple Vitamin (MULTIVITAMIN WITH MINERALS) TABS tablet Take 1 tablet by mouth daily after breakfast.    . omeprazole (PRILOSEC) 20 MG capsule Take 20 mg by mouth daily before breakfast.     . OVER THE COUNTER MEDICATION Place 1 drop into both eyes daily as needed (dry eyes). Over the counter lubricating eye drops - must be preservative free     No current facility-administered medications for this visit.      Allergies:   Naproxen sodium;  Oxycodone hcl; Sulfa antibiotics; and Tramadol   Social History:  The patient  reports that he has never smoked. He has never used smokeless tobacco. He reports that he does not drink alcohol or use drugs.   Family History:   family history includes Heart attack in his brother; Heart attack (age of onset: 12) in his mother.    Review of Systems: Review of Systems  Constitutional: Negative.   Respiratory: Negative.   Cardiovascular: Positive for chest pain.       Pain in the chest when he sits forward from supine position  Gastrointestinal: Negative.   Musculoskeletal: Negative.   Neurological: Negative.   Psychiatric/Behavioral: Negative.   All other systems reviewed and are negative.    PHYSICAL EXAM: VS:  BP (!) 173/78 (BP Location: Right Arm, Patient Position: Sitting, Cuff Size: Normal)   Pulse 66   Ht 5\' 9"  (1.753 m)   Wt 173 lb 8 oz (78.7 kg)   BMI 25.62 kg/m  , BMI Body mass index is 25.62 kg/m. GEN: Well nourished, well developed, in  no acute distress  HEENT: normal  Neck: no JVD, carotid bruits, or masses Cardiac: RRR; no murmurs, rubs, or gallops,no edema  Respiratory:  clear to auscultation bilaterally, normal work of breathing GI: soft, nontender, nondistended, + BS MS: no deformity or atrophy  Skin: warm and dry, no rash Neuro:  Strength and sensation are intact Psych: euthymic mood, full affect    Recent Labs: 08/29/2016: ALT 16; Magnesium 2.2; TSH 1.868 09/01/2016: BUN 21; Creatinine, Ser 1.70; Hemoglobin 10.0; Platelets 198; Potassium 3.3; Sodium 138    Lipid Panel Lab Results  Component Value Date   CHOL 164 08/26/2016   HDL 44 08/26/2016   LDLCALC 103 (H) 08/26/2016   TRIG 84 08/26/2016      Wt Readings from Last 3 Encounters:  12/24/16 173 lb 8 oz (78.7 kg)  12/04/16 175 lb (79.4 kg)  10/15/16 175 lb 12.8 oz (79.7 kg)       ASSESSMENT AND PLAN:  Coronary artery disease of native artery of native heart with stable angina pectoris ([place])  - Plan: EKG 12-Lead Currently with no symptoms of angina. No further workup at this time. Continue current medication regimen.  S/P CABG x 5 - Plan: EKG 12-Lead Recovered well, recent chest discomfort that is musculoskeletal If symptoms get worse would perform x-ray, possibly CT scan Unable to exclude sternal malunion  Chronic renal disease, stage 3, moderately decreased glomerular filtration rate between 30-59 mL/min/1.73 square meter - Plan: EKG 12-Lead Creatinine slowly climbing now 2.1 He will avoid NSAIDs  Type 2 diabetes mellitus without complication, unspecified whether long term insulin use ([place]) - Plan: EKG 12-Lead Hemoglobin A1c down to 7, recommended strict diet Likely contributing to nephropathy  Chronic diastolic CHF (congestive heart failure) ([place]) - Plan: EKG 12-Lead Appears relatively euvolemic, no longer on Lasix  Musculoskeletal chest pain Suspect he has been overdoing it, lifting heavy items Recommended resting, icing. Avoiding NSAIDs  Paroxysmal atrial fibrillation ([place]) Presumably after bypass surgery, currently not on anticoagulation He is on amiodarone, beta blocker  Disposition:   F/U  6 months   Total encounter time more than 45 minutes  Greater than 50% was spent in counseling and coordination of care with the patient    Orders Placed This Encounter  Procedures  . EKG 12-Lead     Signed, Esmond Plants, M.D., Ph.D. 12/24/2016  [place], [place]

## 2016-12-24 ENCOUNTER — Ambulatory Visit (INDEPENDENT_AMBULATORY_CARE_PROVIDER_SITE_OTHER): Payer: Medicare Other | Admitting: Cardiovascular Disease

## 2016-12-24 ENCOUNTER — Encounter: Payer: Self-pay | Admitting: Cardiovascular Disease

## 2016-12-24 VITALS — BP 173/78 | HR 66 | Ht 69.0 in | Wt 173.5 lb

## 2016-12-24 DIAGNOSIS — I480 Paroxysmal atrial fibrillation: Secondary | ICD-10-CM | POA: Diagnosis not present

## 2016-12-24 DIAGNOSIS — N183 Chronic kidney disease, stage 3 unspecified: Secondary | ICD-10-CM

## 2016-12-24 DIAGNOSIS — Z951 Presence of aortocoronary bypass graft: Secondary | ICD-10-CM

## 2016-12-24 DIAGNOSIS — I5032 Chronic diastolic (congestive) heart failure: Secondary | ICD-10-CM | POA: Diagnosis not present

## 2016-12-24 DIAGNOSIS — E119 Type 2 diabetes mellitus without complications: Secondary | ICD-10-CM | POA: Diagnosis not present

## 2016-12-24 DIAGNOSIS — R0789 Other chest pain: Secondary | ICD-10-CM | POA: Insufficient documentation

## 2016-12-24 DIAGNOSIS — I209 Angina pectoris, unspecified: Secondary | ICD-10-CM

## 2016-12-24 DIAGNOSIS — I25118 Atherosclerotic heart disease of native coronary artery with other forms of angina pectoris: Secondary | ICD-10-CM | POA: Diagnosis not present

## 2016-12-24 NOTE — Patient Instructions (Addendum)
Medication Instructions:   Try metoprolol 1/2 pill twice a day  If blood pressure runs high at home,  Call the office We would increase the losartan up to 50 mg daily  Labwork:  No new labs needed  Testing/Procedures:  No further testing at this time   Follow-Up: It was a pleasure seeing you in the office today. Please call us if you have new issues that need to be addressed before your next appt.  [phone]  Your physician wants you to follow-up in: 6 months.  You will receive a reminder letter in the mail two months in advance. If you don't receive a letter, please call our office to schedule the follow-up appointment.  If you need a refill on your cardiac medications before your next appointment, please call your pharmacy.

## 2017-08-06 ENCOUNTER — Telehealth: Payer: Self-pay | Admitting: Cardiovascular Disease

## 2017-08-06 NOTE — Telephone Encounter (Signed)
° °  [facility] Pre-operative Risk Assessment    Request for surgical clearance:  1. What type of surgery is being performed? Colonoscopy    2. When is this surgery scheduled? 10/21/17     3. What type of clearance is required (medical clearance vs. Pharmacy clearance to hold med vs. Both)? Both  4. Are there any medications that need to be held prior to surgery and how long?         [facility] PROTOCOL    ASA 81 mg and 325 mg x morning of procedure     Brilinta x 5 days Coumadn x 5 days         Eliquis x 3 days Plavix x 5 days          Pradaxa x 3 days xarelto x3 days    Aggrenox recommbedations needed by prescriber    Effient recommbedations needed by prescriber      lovenox bridge will be handled on case by case basis     5. Practice name and name of physician performing surgery? [facility] Dawson Bills NP  6. What is your office phone and fax number? [phone] fax [phone]   7. Anesthesia type (None, local, MAC, general) ? propofol   Clarisse Gouge 08/06/2017, 2:22 PM  _________________________________________________________________   (provider comments below)

## 2017-08-12 NOTE — Telephone Encounter (Signed)
Would stop plavix 5 days before procedure No further testing needed

## 2017-08-13 NOTE — Telephone Encounter (Signed)
Clearance routed to number provided via Epic fax.  

## 2017-08-14 ENCOUNTER — Ambulatory Visit (INDEPENDENT_AMBULATORY_CARE_PROVIDER_SITE_OTHER): Payer: Medicare Other | Admitting: Cardiovascular Disease

## 2017-08-14 ENCOUNTER — Encounter: Payer: Self-pay | Admitting: Cardiovascular Disease

## 2017-08-14 VITALS — BP 138/80 | HR 69 | Ht 69.0 in | Wt 177.2 lb

## 2017-08-14 DIAGNOSIS — I480 Paroxysmal atrial fibrillation: Secondary | ICD-10-CM | POA: Diagnosis not present

## 2017-08-14 DIAGNOSIS — I25118 Atherosclerotic heart disease of native coronary artery with other forms of angina pectoris: Secondary | ICD-10-CM | POA: Diagnosis not present

## 2017-08-14 DIAGNOSIS — E782 Mixed hyperlipidemia: Secondary | ICD-10-CM | POA: Diagnosis not present

## 2017-08-14 DIAGNOSIS — N183 Chronic kidney disease, stage 3 unspecified: Secondary | ICD-10-CM

## 2017-08-14 DIAGNOSIS — I10 Essential (primary) hypertension: Secondary | ICD-10-CM | POA: Diagnosis not present

## 2017-08-14 DIAGNOSIS — E119 Type 2 diabetes mellitus without complications: Secondary | ICD-10-CM | POA: Diagnosis not present

## 2017-08-14 DIAGNOSIS — Z951 Presence of aortocoronary bypass graft: Secondary | ICD-10-CM | POA: Diagnosis not present

## 2017-08-14 MED ORDER — TICAGRELOR 90 MG PO TABS: 90.0000 mg | ORAL_TABLET | Freq: Two times a day (BID) | ORAL | 11 refills | Status: DC

## 2017-08-14 MED ORDER — METOPROLOL SUCCINATE ER 25 MG PO TB24: 25.0000 mg | ORAL_TABLET | Freq: Every day | ORAL | 3 refills | Status: DC

## 2017-08-14 MED ORDER — AMLODIPINE BESYLATE 10 MG PO TABS: 10.0000 mg | ORAL_TABLET | Freq: Every day | ORAL | 3 refills | Status: DC

## 2017-08-14 NOTE — Patient Instructions (Addendum)
Medication Instructions:  Your physician has recommended you make the following change in your medication:  1. START Amlodipine 10 mg once daily in the morning 2. START Metoprolol succinate 25 mg once daily in the evening (dinner) 3. START Brilinta 90 mg twice a day  Medication Samples have been provided to the patient.  Drug name: Brilinta       Strength: 90 mg        Qty: 4 bottles  LOT: [id number]  Exp.Date: 10/21  Labwork:  No new labs needed  Testing/Procedures:  No further testing at this time   Follow-Up: It was a pleasure seeing you in the office today. Please call us if you have new issues that need to be addressed before your next appt.  [phone]  Your physician wants you to follow-up in: 12 months.  You will receive a reminder letter in the mail two months in advance. If you don't receive a letter, please call our office to schedule the follow-up appointment.  If you need a refill on your cardiac medications before your next appointment, please call your pharmacy.  For educational health videos Log in to : [email or web address] Or : [email or web address], password : [email or web address]

## 2017-08-14 NOTE — Progress Notes (Signed)
Cardiology Office Note  Date:  08/14/2017   ID:  HAWKINS SEAMAN, DOB [birth year], MRN [id number]  PCP:  Rusty Aus, MD   Chief Complaint  Patient presents with  . other    Needs a cardiac clearance for a colonoscopy; scheduled for June 24,2019 with Dr. Vira Agar. Meds reviewed by the pt. verbally. "doing well."    HPI:  Mr. Wender is a pleasant 77 year old gentleman with past medical history of CKD, creatinine 2.1 Diabetes, hemoglobin A1c 7.0 S/p NSTEMI.  s/p CABG x 5 on 08/25/16 PAF -  Essential hypertension H/O prostate cancer S/P Radical Prostatectomy  Chronic diastolic CHF - LVEF 56-31% Who presents for follow-up of his coronary disease, bypass surgery  In follow-up today reports that he has had continued Diarrhea, "quiet a while" "fruit bothers him", apples Previous records reviewed, Dr. Weston Settle evaluated him for diarrhea in 04/2016, 10/2015 Sabra Heck stopped plavix, side effects? Off for a few weeks, does not remember side effects on today's visit reports that he feels better  Labs: CR 2.1, up from 1.4  Total chol 98, LD 49 HBA1C 8.1  No chest pain, or SOB on exertion Would like to consolidate some of his medications  Reports having occasional tightness in his mediastinal area if he pushes himself too hard  EKG personally reviewed by myself on todays visit Shows normal sinus rhythm with rate 69 bpm with no significant ST or T wave changes   Hospital Records reviewed with him in detail Cath   08/27/2016  Severe three-vessel coronary disease with total occlusion of the proximal LAD after the first diagonal, high-grade obstruction with superimposed thrombus in the large first diagonal which collateralizes the distal LAD, 80-90% obstruction in a small to moderate ramus intermedius, 70% stenosis and small first obtuse marginal, greater than 90% obstruction in the ostium to proximal PDA and 70% obstruction in the ongoing right coronary before a large left ventricular  branch.  Coronary artery bypass grafting x5 with the left internal mammary to the left anterior descending coronary artery, reverse saphenous vein graft to the diagonal coronary artery, reverse saphenous vein graft to the obtuse marginal coronary artery, sequential reverse saphenous vein graft to the posterior descending and posterior branches of the right coronary artery  PMH:   has a past medical history of Cancer ([place]), Coronary artery disease, Diabetes mellitus, GERD (gastroesophageal reflux disease), Hemorrhoids, Hyperlipidemia, Hypertension, MI (myocardial infarction) ([place]), PONV (postoperative nausea and vomiting), and Thyroid disease.  PSH:    Past Surgical History:  Procedure Laterality Date  . APPENDECTOMY    . colonoscopy with polypectomy    . CORONARY ARTERY BYPASS GRAFT N/A 08/28/2016   Procedure: CORONARY ARTERY BYPASS GRAFTING (CABG) x five, using left internal mammary artery and right leg greater saphenous vein harvested endoscopically - -LIMA to LAD, -SVG to OM, -SVG to DIAGONAL, -SEQ SVG to PDA, PLVB ;  Surgeon: [name], MD;  Location: [place];  Service: Open Heart Surgery;  Laterality: N/A;  . CYSTOSCOPY N/A 09/29/2013   Procedure: CYSTOSCOPY;  Surgeon: [name], MD;  [facility];  Service: Urology;  Laterality: N/A;  . HERNIA REPAIR     inguinal bilaterally  . LEFT HEART CATH AND CORONARY ANGIOGRAPHY N/A 08/27/2016   Procedure: Left Heart Cath and Coronary Angiography;  Surgeon: [name], MD;  Location: [facility];  Service: Cardiovascular;  Laterality: N/A;  . ROBOT ASSISTED LAPAROSCOPIC RADICAL PROSTATECTOMY  06/25/2011   Procedure: ROBOTIC ASSISTED LAPAROSCOPIC RADICAL PROSTATECTOMY LEVEL  2;  Surgeon: [name], MD;  [facility];  Service: Urology;  Laterality: N/A;  . TEE WITHOUT CARDIOVERSION N/A 08/28/2016   Procedure: TRANSESOPHAGEAL ECHOCARDIOGRAM (TEE);  Surgeon: [name], MD;  Location: [facility];  Service: Open Heart  Surgery;  Laterality: N/A;  . URETHRAL SLING N/A 09/29/2013   Procedure: MALE SLING;  Surgeon: [name], MD;  [facility];  Service: Urology;  Laterality: N/A;    Current Outpatient Medications  Medication Sig Dispense Refill  . acetaminophen (TYLENOL) 500 MG tablet Take 1,000 mg by mouth daily as needed for headache (pain).    Marland Kitchen amLODipine (NORVASC) 5 MG tablet Take 5 mg by mouth 2 (two) times daily.    Marland Kitchen aspirin EC 81 MG tablet Take 81 mg by mouth daily.    Marland Kitchen atorvastatin (LIPITOR) 40 MG tablet Take 1 tablet (40 mg total) by mouth daily at 6 PM. 30 tablet 1  . b complex vitamins tablet Take 1 tablet by mouth daily after breakfast.    . cholecalciferol (VITAMIN D) 1000 units tablet Take 1,000 Units by mouth daily after breakfast.    . hydrocortisone cream 1 % Apply 1 application topically daily as needed for itching.    . insulin glargine (LANTUS) 100 unit/mL SOPN Inject 17 Units into the skin daily before breakfast.    . insulin lispro (HUMALOG KWIKPEN) 100 UNIT/ML KiwkPen Inject 7-10 Units into the skin See admin instructions. Inject 7 units subcutaneously before breakfast, lunch and dinner - plus adjustment for CBG 140-180 add 1 unit, 180-220 add 3 units, >220 add 3 units    . levothyroxine (SYNTHROID, LEVOTHROID) 75 MCG tablet Take 75 mcg by mouth daily before breakfast.    . losartan (COZAAR) 25 MG tablet Take 1 tablet (25 mg total) by mouth daily after breakfast. 30 tablet 1  . metoprolol tartrate (LOPRESSOR) 12.5 mg TABS tablet Take 12.5 mg by mouth 2 (two) times daily.    . Multiple Vitamin (MULTIVITAMIN WITH MINERALS) TABS tablet Take 1 tablet by mouth daily after breakfast.    . omeprazole (PRILOSEC) 20 MG capsule Take 20 mg by mouth daily before breakfast.     . OVER THE COUNTER MEDICATION Place 1 drop into both eyes daily as needed (dry eyes). Over the counter lubricating eye drops - must be preservative free     No current facility-administered medications for this  visit.      Allergies:   Naproxen sodium; Other; Oxycodone hcl; Sulfa antibiotics; and Tramadol   Social History:  The patient  reports that he has never smoked. He has never used smokeless tobacco. He reports that he does not drink alcohol or use drugs.   Family History:   family history includes Heart attack in his brother; Heart attack (age of onset: 68) in his mother.    Review of Systems: Review of Systems  Constitutional: Negative.   Respiratory: Negative.   Cardiovascular: Positive for chest pain.  Gastrointestinal: Negative.   Musculoskeletal: Negative.   Neurological: Negative.   Psychiatric/Behavioral: Negative.   All other systems reviewed and are negative.    PHYSICAL EXAM: VS:  BP 138/80 (BP Location: Left Arm, Patient Position: Sitting, Cuff Size: Normal)   Pulse 69   Ht 5\' 9"  (1.753 m)   Wt 177 lb 4 oz (80.4 kg)   BMI 26.18 kg/m  , BMI Body mass index is 26.18 kg/m. Constitutional:  oriented to person, place, and time. No distress.  HENT:  Head: Normocephalic and  atraumatic.  Eyes:  no discharge. No scleral icterus.  Neck: Normal range of motion. Neck supple. No JVD present.  Cardiovascular: Normal rate, regular rhythm, normal heart sounds and intact distal pulses. Exam reveals no gallop and no friction rub. No edema No murmur heard. Pulmonary/Chest: Effort normal and breath sounds normal. No stridor. No respiratory distress.  no wheezes.  no rales.  no tenderness.  Abdominal: Soft.  no distension.  no tenderness.  Musculoskeletal: Normal range of motion.  no  tenderness or deformity.  Neurological:  normal muscle tone. Coordination normal. No atrophy Skin: Skin is warm and dry. No rash noted. not diaphoretic.  Psychiatric:  normal mood and affect. behavior is normal. Thought content normal.   Recent Labs: 08/29/2016: ALT 16; Magnesium 2.2; TSH 1.868 09/01/2016: BUN 21; Creatinine, Ser 1.70; Hemoglobin 10.0; Platelets 198; Potassium 3.3; Sodium 138     Lipid Panel Lab Results  Component Value Date   CHOL 164 08/26/2016   HDL 44 08/26/2016   LDLCALC 103 (H) 08/26/2016   TRIG 84 08/26/2016      Wt Readings from Last 3 Encounters:  08/14/17 177 lb 4 oz (80.4 kg)  12/24/16 173 lb 8 oz (78.7 kg)  12/04/16 175 lb (79.4 kg)       ASSESSMENT AND PLAN:  Coronary artery disease of native artery of native heart with stable angina pectoris ([place]) - Plan: EKG 12-Lead Currently with no symptoms of angina. No further workup at this time. Continue current medication regimen.  Pain with overexertion is atypical, likely from mediastinal discomfort following bypass surgery.  He is concerned that Plavix was held.  Does not want to restart Plavix as he feels he was having side effects but does not remember what the side effects were.  On the other alternative is brilinta .  After he has made his blood pressure medication changes below, and several weeks suggested he could try brilinta 90 mg twice daily  S/P CABG x 5 - Plan: EKG 12-Lead Rare chest pain in the middle likely from over exertion, musculoskeletal   Chronic renal disease, stage 3, moderately decreased glomerular filtration rate between 30-59 mL/min/1.73 square meter - Plan: EKG 12-Lead Creatinine slowly climbing now 2.1 He will avoid NSAIDs Recommended he increase his hydration.  Possibly exacerbated by chronic diarrhea  Type 2 diabetes mellitus without complication, unspecified whether long term insulin use ([place]) - Plan: EKG 12-Lead Hemoglobin A1c has been trending upwards now 8 Discussed dietary changes with him,  Importance of strict sugar control given his severe three-vessel disease  Chronic diastolic CHF (congestive heart failure) ([place]) - Plan: EKG 12-Lead Continues to appear euvolemic, ejection fraction 50-55% 1 year ago  Paroxysmal atrial fibrillation ([facility])  after bypass surgery, currently not on anticoagulation Amiodarone held previously On low-dose beta blocker He  would like to consolidate his medications and we recommended he take metoprolol succinate 25 mg daily  Hypertension We will consolidate medications, amlodipine 10 mg daily not 5 twice daily at his request  Disposition:   F/U  12 months   Total encounter time more than 45 minutes  Greater than 50% was spent in counseling and coordination of care with the patient    Orders Placed This Encounter  Procedures  . EKG 12-Lead     Signed, Esmond Plants, M.D., Ph.D. 08/14/2017  [place], [place]

## 2017-09-16 ENCOUNTER — Telehealth: Payer: Self-pay | Admitting: Cardiovascular Disease

## 2017-09-16 NOTE — Telephone Encounter (Signed)
Pt wife states Brilinta is making pt have diarrhea. Please call to discuss alternative.

## 2017-09-16 NOTE — Telephone Encounter (Signed)
To Dr. Rockey Situ to review/ recommendations.

## 2017-09-16 NOTE — Telephone Encounter (Signed)
Notes from PMD indicated diarrhea dating back to 10/2105, documented by Dr. Sabra Heck He had diarrhea before starting brilinta and that is why miller stopped a different medication, plavix  He can stop brilinta but suspect not the cause He should see GI

## 2017-09-17 ENCOUNTER — Encounter: Payer: Self-pay | Admitting: *Deleted

## 2017-09-17 NOTE — Telephone Encounter (Signed)
I spoke with the patient's wife. She did confirm that Dr. Sabra Heck had instructed the patient to stop plavix about a month ago due to diarrhea symptoms. I inquired if the patient's diarrhea had completely resolved during that time- per Mrs. Nobbe, the patient did still have some intermittent diarrhea while off plavix. Brilinta was started by Dr. Rockey Situ on 08/14/17 and after 2 doses, the patient reported worsening of his diarrhea.  He took his last dose on Saturday and he has not had any diarrhea since. I advised the patient's wife to have the patient continue to hold brilinta through the weekend and to call the office back early next week with how he is doing.  The patient is scheduled for a follow up colonoscopy on 10/21/17 with Dr. Vira Agar.

## 2017-09-17 NOTE — Telephone Encounter (Signed)
Patient's wife returning call. 

## 2017-09-17 NOTE — Telephone Encounter (Signed)
Left voicemail message to call back  

## 2017-12-02 ENCOUNTER — Encounter
Admission: RE | Disposition: A | Discharge status: Home / Self Care | Payer: Self-pay | Source: Ambulatory Visit | Attending: Unknown Physician Specialty

## 2017-12-02 ENCOUNTER — Ambulatory Visit
Admission: RE | Admit: 2017-12-02 | Discharge: 2017-12-02 | Disposition: A | Discharge status: Home / Self Care | Payer: Medicare Other | Source: Ambulatory Visit | Attending: Unknown Physician Specialty | Admitting: Unknown Physician Specialty

## 2017-12-02 ENCOUNTER — Ambulatory Visit: Payer: Medicare Other | Admitting: Anesthesiology

## 2017-12-02 ENCOUNTER — Encounter: Payer: Self-pay | Admitting: *Deleted

## 2017-12-02 DIAGNOSIS — Z794 Long term (current) use of insulin: Secondary | ICD-10-CM | POA: Insufficient documentation

## 2017-12-02 DIAGNOSIS — E1122 Type 2 diabetes mellitus with diabetic chronic kidney disease: Secondary | ICD-10-CM | POA: Insufficient documentation

## 2017-12-02 DIAGNOSIS — Z7982 Long term (current) use of aspirin: Secondary | ICD-10-CM | POA: Insufficient documentation

## 2017-12-02 DIAGNOSIS — Z7902 Long term (current) use of antithrombotics/antiplatelets: Secondary | ICD-10-CM | POA: Diagnosis not present

## 2017-12-02 DIAGNOSIS — Z951 Presence of aortocoronary bypass graft: Secondary | ICD-10-CM | POA: Diagnosis not present

## 2017-12-02 DIAGNOSIS — I2510 Atherosclerotic heart disease of native coronary artery without angina pectoris: Secondary | ICD-10-CM | POA: Diagnosis not present

## 2017-12-02 DIAGNOSIS — E785 Hyperlipidemia, unspecified: Secondary | ICD-10-CM | POA: Diagnosis not present

## 2017-12-02 DIAGNOSIS — N189 Chronic kidney disease, unspecified: Secondary | ICD-10-CM | POA: Insufficient documentation

## 2017-12-02 DIAGNOSIS — I252 Old myocardial infarction: Secondary | ICD-10-CM | POA: Insufficient documentation

## 2017-12-02 DIAGNOSIS — K219 Gastro-esophageal reflux disease without esophagitis: Secondary | ICD-10-CM | POA: Diagnosis not present

## 2017-12-02 DIAGNOSIS — Z86010 Personal history of colonic polyps: Secondary | ICD-10-CM | POA: Diagnosis not present

## 2017-12-02 DIAGNOSIS — K317 Polyp of stomach and duodenum: Secondary | ICD-10-CM | POA: Insufficient documentation

## 2017-12-02 DIAGNOSIS — D123 Benign neoplasm of transverse colon: Secondary | ICD-10-CM | POA: Diagnosis not present

## 2017-12-02 DIAGNOSIS — Z8249 Family history of ischemic heart disease and other diseases of the circulatory system: Secondary | ICD-10-CM | POA: Diagnosis not present

## 2017-12-02 DIAGNOSIS — I129 Hypertensive chronic kidney disease with stage 1 through stage 4 chronic kidney disease, or unspecified chronic kidney disease: Secondary | ICD-10-CM | POA: Diagnosis not present

## 2017-12-02 DIAGNOSIS — R194 Change in bowel habit: Secondary | ICD-10-CM | POA: Insufficient documentation

## 2017-12-02 DIAGNOSIS — E039 Hypothyroidism, unspecified: Secondary | ICD-10-CM | POA: Diagnosis not present

## 2017-12-02 DIAGNOSIS — K449 Diaphragmatic hernia without obstruction or gangrene: Secondary | ICD-10-CM | POA: Diagnosis not present

## 2017-12-02 DIAGNOSIS — K222 Esophageal obstruction: Secondary | ICD-10-CM | POA: Insufficient documentation

## 2017-12-02 DIAGNOSIS — Z1211 Encounter for screening for malignant neoplasm of colon: Secondary | ICD-10-CM | POA: Insufficient documentation

## 2017-12-02 HISTORY — PX: COLONOSCOPY WITH PROPOFOL: SHX5780

## 2017-12-02 HISTORY — DX: Constipation, unspecified: K59.00

## 2017-12-02 HISTORY — PX: ESOPHAGOGASTRODUODENOSCOPY (EGD) WITH PROPOFOL: SHX5813

## 2017-12-02 HISTORY — DX: Chronic kidney disease, unspecified: N18.9

## 2017-12-02 HISTORY — DX: Hypothyroidism, unspecified: E03.9

## 2017-12-02 HISTORY — DX: Polyp of colon: K63.5

## 2017-12-02 HISTORY — DX: Atherosclerotic heart disease of native coronary artery without angina pectoris: I25.10

## 2017-12-02 LAB — GLUCOSE, CAPILLARY: Glucose-Capillary: 144 mg/dL — ABNORMAL HIGH (ref 70–99)

## 2017-12-02 SURGERY — ESOPHAGOGASTRODUODENOSCOPY (EGD) WITH PROPOFOL
Anesthesia: General

## 2017-12-02 MED ORDER — PROPOFOL 10 MG/ML IV BOLUS
INTRAVENOUS | Status: DC | PRN
  Administered 2017-12-02 (×3): 20 mg via INTRAVENOUS

## 2017-12-02 MED ORDER — ESMOLOL HCL 100 MG/10ML IV SOLN
INTRAVENOUS | Status: DC | PRN
  Administered 2017-12-02 (×2): 10 mg via INTRAVENOUS

## 2017-12-02 MED ORDER — MIDAZOLAM HCL 5 MG/5ML IJ SOLN
INTRAMUSCULAR | Status: DC | PRN
  Administered 2017-12-02: 1 mg via INTRAVENOUS

## 2017-12-02 MED ORDER — SODIUM CHLORIDE 0.9 % IV SOLN
INTRAVENOUS | Status: DC
  Administered 2017-12-02: 1000 mL via INTRAVENOUS

## 2017-12-02 MED ORDER — EPHEDRINE SULFATE 50 MG/ML IJ SOLN
INTRAMUSCULAR | Status: AC
  Filled 2017-12-02: qty 1

## 2017-12-02 MED ORDER — FENTANYL CITRATE (PF) 100 MCG/2ML IJ SOLN
INTRAMUSCULAR | Status: AC
  Filled 2017-12-02: qty 2

## 2017-12-02 MED ORDER — LIDOCAINE HCL (PF) 2 % IJ SOLN
INTRAMUSCULAR | Status: AC
  Filled 2017-12-02: qty 10

## 2017-12-02 MED ORDER — GLYCOPYRROLATE 0.2 MG/ML IJ SOLN
INTRAMUSCULAR | Status: AC
  Filled 2017-12-02: qty 1

## 2017-12-02 MED ORDER — MIDAZOLAM HCL 2 MG/2ML IJ SOLN
INTRAMUSCULAR | Status: AC
  Filled 2017-12-02: qty 2

## 2017-12-02 MED ORDER — ESMOLOL HCL 100 MG/10ML IV SOLN
INTRAVENOUS | Status: AC
  Filled 2017-12-02: qty 10

## 2017-12-02 MED ORDER — SODIUM CHLORIDE 0.9 % IV SOLN: INTRAVENOUS | Status: DC

## 2017-12-02 MED ORDER — PROPOFOL 500 MG/50ML IV EMUL
INTRAVENOUS | Status: AC
  Filled 2017-12-02: qty 50

## 2017-12-02 MED ORDER — LIDOCAINE HCL (PF) 2 % IJ SOLN
INTRAMUSCULAR | Status: DC | PRN
  Administered 2017-12-02: 80 mg

## 2017-12-02 MED ORDER — PHENYLEPHRINE HCL 10 MG/ML IJ SOLN
INTRAMUSCULAR | Status: AC
  Filled 2017-12-02: qty 1

## 2017-12-02 MED ORDER — PROPOFOL 500 MG/50ML IV EMUL
INTRAVENOUS | Status: DC | PRN
  Administered 2017-12-02: 50 ug/kg/min via INTRAVENOUS

## 2017-12-02 MED ORDER — FENTANYL CITRATE (PF) 100 MCG/2ML IJ SOLN
INTRAMUSCULAR | Status: DC | PRN
  Administered 2017-12-02 (×4): 25 ug via INTRAVENOUS

## 2017-12-02 NOTE — Op Note (Signed)
[facility] Gastroenterology Patient Name: Pearley Baranek Procedure Date: 12/02/2017 7:33 AM MRN: [id number] Account #: [id number] Date of Birth: 1941/04/18 Admit Type: Outpatient Age: 77 Room: [facility] ENDO ROOM 3 Gender: Male Note Status: Finalized Procedure:            Colonoscopy Indications:          High risk colon cancer surveillance: Personal history                        of colonic polyps Providers:            [name], MD Referring MD:         Rusty Aus, MD (Referring MD) Medicines:            Propofol per Anesthesia Complications:        No immediate complications. Procedure:            Pre-Anesthesia Assessment:                       - After reviewing the risks and benefits, the patient                        was deemed in satisfactory condition to undergo the                        procedure.                       After obtaining informed consent, the colonoscope was                        passed under direct vision. Throughout the procedure,                        the patient's blood pressure, pulse, and oxygen                        saturations were monitored continuously. The                        Colonoscope was introduced through the anus and                        advanced to the the cecum, identified by appendiceal                        orifice and ileocecal valve. The colonoscopy was                        performed without difficulty. The patient tolerated the                        procedure well. The quality of the bowel preparation                        was good. Findings:      A 15 mm polyp was found in the transverse colon. The polyp was sessile.       The polyp was removed with a hot snare. Resection and retrieval were       complete. To prevent bleeding after the polypectomy, five hemostatic  clips were successfully placed. There was no bleeding at the end of the       procedure.      A small polyp was found in the  hepatic flexure. The polyp was sessile.       The polyp was removed with a hot snare. Resection and retrieval were       complete. To prevent bleeding after the polypectomy, one hemostatic clip       was successfully placed. There was no bleeding at the end of the       procedure.      One clip applied to an area next to the polyp.      Internal hemorrhoids were found during endoscopy. The hemorrhoids were       small and Grade I (internal hemorrhoids that do not prolapse). Impression:           - One 15 mm polyp in the transverse colon, removed with                        a hot snare. Resected and retrieved. Clips were placed.                       - One small polyp at the hepatic flexure, removed with                        a hot snare. Resected and retrieved. Clip was placed.                       - Internal hemorrhoids. Recommendation:       - Await pathology results. [name], MD 12/02/2017 8:36:55 AM This report has been signed electronically. Number of Addenda: 0 Note Initiated On: 12/02/2017 7:33 AM Scope Withdrawal Time: 0 hours 12 minutes 32 seconds  Total Procedure Duration: 0 hours 33 minutes 53 seconds       [facility]

## 2017-12-02 NOTE — Transfer of Care (Signed)
Immediate Anesthesia Transfer of Care Note  Patient: Victor Gould  Procedure(s) Performed: ESOPHAGOGASTRODUODENOSCOPY (EGD) WITH PROPOFOL (N/A ) COLONOSCOPY WITH PROPOFOL (N/A )  Patient Location: PACU  Anesthesia Type:General  Level of Consciousness: sedated  Airway & Oxygen Therapy: Patient Spontanous Breathing and Patient connected to nasal cannula oxygen  Post-op Assessment: Report given to RN and Post -op Vital signs reviewed and stable  Post vital signs: Reviewed and stable  Last Vitals:  Vitals Value Taken Time  BP    Temp    Pulse 94 12/02/2017  8:31 AM  Resp 13 12/02/2017  8:31 AM  SpO2 97 % 12/02/2017  8:31 AM  Vitals shown include unvalidated device data.  Last Pain:  Vitals:   12/02/17 0831  TempSrc: (P) Tympanic  PainSc:          Complications: No apparent anesthesia complications

## 2017-12-02 NOTE — H&P (Signed)
Primary Care Physician:  Rusty Aus, MD Primary Gastroenterologist:  Dr. Vira Agar  Pre-Procedure History & Physical: HPI:  Victor Gould is a 77 y.o. male is here for an endoscopy and colonoscopy.  Done for [facility] colon polyps and GERD.   Past Medical History:  Diagnosis Date  . ASCVD (arteriosclerotic cardiovascular disease)   . Cancer [facility])    prostate  . Chronic kidney disease   . Colon polyps   . Constipation   . Coronary artery disease   . Diabetes mellitus   . GERD (gastroesophageal reflux disease)   . Hemorrhoids   . Hyperlipidemia   . Hypertension    PCP [name] at [place]  . Hypothyroidism   . MI (myocardial infarction) ([place])   . PONV (postoperative nausea and vomiting)   . Thyroid disease     Past Surgical History:  Procedure Laterality Date  . APPENDECTOMY    . COLONOSCOPY    . colonoscopy with polypectomy    . CORONARY ARTERY BYPASS GRAFT N/A 08/28/2016   Procedure: CORONARY ARTERY BYPASS GRAFTING (CABG) x five, using left internal mammary artery and right leg greater saphenous vein harvested endoscopically - -LIMA to LAD, -SVG to OM, -SVG to DIAGONAL, -SEQ SVG to PDA, PLVB ;  Surgeon: [name], MD;  Location: [place];  Service: Open Heart Surgery;  Laterality: N/A;  . CYSTOSCOPY N/A 09/29/2013   Procedure: CYSTOSCOPY;  Surgeon: [name], MD;  [facility]: [facility];  Service: Urology;  Laterality: N/A;  . HERNIA REPAIR     inguinal bilaterally  . LEFT HEART CATH AND CORONARY ANGIOGRAPHY N/A 08/27/2016   Procedure: Left Heart Cath and Coronary Angiography;  Surgeon: [name], MD;  Location: [place] [facility];  Service: Cardiovascular;  Laterality: N/A;  . ROBOT ASSISTED LAPAROSCOPIC RADICAL PROSTATECTOMY  06/25/2011   Procedure: ROBOTIC ASSISTED LAPAROSCOPIC RADICAL PROSTATECTOMY LEVEL 2;  Surgeon: [name], MD;  [facility];  Service: Urology;  Laterality: N/A;  . TEE WITHOUT CARDIOVERSION N/A 08/28/2016   Procedure:  TRANSESOPHAGEAL ECHOCARDIOGRAM (TEE);  Surgeon: [name], MD;  Location: [place];  Service: Open Heart Surgery;  Laterality: N/A;  . UPPER GASTROINTESTINAL ENDOSCOPY    . URETHRAL SLING N/A 09/29/2013   Procedure: MALE SLING;  Surgeon: [name], MD;  [facility];  Service: Urology;  Laterality: N/A;    Prior to Admission medications   Medication Sig Start Date End Date Taking? Authorizing Provider  aspirin EC 81 MG tablet Take 81 mg by mouth daily.   Yes [provider]  atorvastatin (LIPITOR) 40 MG tablet Take 1 tablet (40 mg total) by mouth daily at 6 PM. 09/03/16  Yes Lars Pinks M, PA-C  b complex vitamins tablet Take 1 tablet by mouth daily after breakfast.   Yes [provider]  cholecalciferol (VITAMIN D) 1000 units tablet Take 1,000 Units by mouth daily after breakfast.   Yes [provider]  hydrocortisone cream 1 % Apply 1 application topically daily as needed for itching.   Yes [provider]  insulin glargine (LANTUS) 100 unit/mL SOPN Inject 17 Units into the skin daily before breakfast.   Yes [provider]  insulin lispro (HUMALOG KWIKPEN) 100 UNIT/ML KiwkPen Inject 7-10 Units into the skin See admin instructions. Inject 7 units subcutaneously before breakfast, lunch and dinner - plus adjustment for CBG 140-180 add 1 unit, 180-220 add 3 units, >220 add 3 units   Yes [provider]  levothyroxine (SYNTHROID, LEVOTHROID) 75 MCG tablet Take 75 mcg by mouth daily before breakfast.   Yes [provider]  losartan (COZAAR) 25 MG tablet Take 1 tablet (25 mg total) by mouth daily after breakfast. 09/03/16  Yes Lars Pinks M, PA-C  metoprolol succinate (TOPROL XL) 25 MG 24 hr tablet Take 1 tablet (25 mg total) by mouth daily. 08/14/17  Yes Minna Merritts, MD  metoprolol tartrate (LOPRESSOR) 12.5 mg TABS tablet Take 12.5 mg by mouth 2 (two) times daily.   Yes [provider]  Multiple  Vitamin (MULTIVITAMIN WITH MINERALS) TABS tablet Take 1 tablet by mouth daily after breakfast.   Yes [provider]  omeprazole (PRILOSEC) 20 MG capsule Take 20 mg by mouth daily before breakfast.    Yes [provider]  OVER THE COUNTER MEDICATION Place 1 drop into both eyes daily as needed (dry eyes). Over the counter lubricating eye drops - must be preservative free   Yes [provider]  acetaminophen (TYLENOL) 500 MG tablet Take 1,000 mg by mouth daily as needed for headache (pain).    [provider]  amLODipine (NORVASC) 10 MG tablet Take 1 tablet (10 mg total) by mouth daily. 08/14/17 11/12/17  Minna Merritts, MD  benzonatate (TESSALON) 100 MG capsule Take by mouth 3 (three) times daily as needed for cough.    [provider]  ticagrelor (BRILINTA) 90 MG TABS tablet Take 1 tablet (90 mg total) by mouth 2 (two) times daily. Patient not taking: Reported on 12/02/2017 08/14/17   Minna Merritts, MD    Allergies as of 08/22/2017 - Review Complete 08/14/2017  Allergen Reaction Noted  . Naproxen sodium Shortness Of Breath 04/13/2015  . Other Hives 08/14/2017  . Oxycodone hcl Nausea And Vomiting 09/20/2016  . Sulfa antibiotics Rash and Hives 06/18/2011  . Tramadol Other (See Comments) 07/29/2014    Family History  Problem Relation Age of Onset  . Heart attack Mother 52  . Heart attack Brother     Social History   Socioeconomic History  . Marital status: Married    Spouse name: Not on file  . Number of children: Not on file  . Years of education: Not on file  . Highest education level: Not on file  Occupational History  . Not on file  Social Needs  . Financial resource strain: Not on file  . Food insecurity:    Worry: Not on file    Inability: Not on file  . Transportation needs:    Medical: Not on file    Non-medical: Not on file  Tobacco Use  . Smoking status: Never Smoker  . Smokeless tobacco: Never Used  Substance and  Sexual Activity  . Alcohol use: No  . Drug use: No  . Sexual activity: Not on file  Lifestyle  . Physical activity:    Days per week: Not on file    Minutes per session: Not on file  . Stress: Not on file  Relationships  . Social connections:    Talks on phone: Not on file    Gets together: Not on file    Attends religious service: Not on file    Active member of club or organization: Not on file    Attends meetings of clubs or organizations: Not on file    Relationship status: Not on file  . Intimate partner violence:    Fear of current or ex partner: Not on file    Emotionally abused: Not  on file    Physically abused: Not on file    Forced sexual activity: Not on file  Other Topics Concern  . Not on file  Social History Narrative  . Not on file    Review of Systems: See HPI, otherwise negative ROS  Physical Exam: BP (!) 144/70   Pulse 77   Temp (!) 96 F (35.6 C) (Tympanic)   Resp 18   Ht 5\' 6"  (1.676 m)   Wt 77.1 kg (170 lb)   SpO2 100%   BMI 27.44 kg/m  General:   Alert,  pleasant and cooperative in NAD Head:  Normocephalic and atraumatic. Neck:  Supple; no masses or thyromegaly. Lungs:  Clear throughout to auscultation.    Heart:  Regular rate and rhythm. Abdomen:  Soft, nontender and nondistended. Normal bowel sounds, without guarding, and without rebound.   Neurologic:  Alert and  oriented x4;  grossly normal neurologically.  Impression/Plan: [name] is here for an endoscopy and colonoscopy to be performed for [facility] colon polyps and GERD.  Risks, benefits, limitations, and alternatives regarding  endoscopy and colonoscopy have been reviewed with the patient.  Questions have been answered.  All parties agreeable.   Gaylyn Cheers, MD  12/02/2017, 7:32 AM

## 2017-12-02 NOTE — Anesthesia Post-op Follow-up Note (Signed)
Anesthesia QCDR form completed.        

## 2017-12-02 NOTE — Anesthesia Postprocedure Evaluation (Signed)
Anesthesia Post Note  Patient: Victor Gould  Procedure(s) Performed: ESOPHAGOGASTRODUODENOSCOPY (EGD) WITH PROPOFOL (N/A ) COLONOSCOPY WITH PROPOFOL (N/A )  Patient location during evaluation: PACU Anesthesia Type: General Level of consciousness: awake and alert Pain management: pain level controlled Vital Signs Assessment: post-procedure vital signs reviewed and stable Respiratory status: spontaneous breathing, nonlabored ventilation, respiratory function stable and patient connected to nasal cannula oxygen Cardiovascular status: blood pressure returned to baseline and stable Postop Assessment: no apparent nausea or vomiting Anesthetic complications: no     Last Vitals:  Vitals:   12/02/17 0700 12/02/17 0831  BP: (!) 144/70 131/87  Pulse: 77   Resp: 18   Temp: (!) 35.6 C (!) 36.1 C  SpO2: 100%     Last Pain:  Vitals:   12/02/17 0831  TempSrc: Tympanic  PainSc:                  Molli Barrows

## 2017-12-02 NOTE — Op Note (Signed)
[facility] Gastroenterology Patient Name: Victor Gould Procedure Date: 12/02/2017 7:33 AM MRN: [id number] Account #: [id number] Date of Birth: May 01, 1940 Admit Type: Outpatient Age: 77 Room: [facility] ENDO ROOM 3 Gender: Male Note Status: Finalized Procedure:            Upper GI endoscopy Indications:          Heartburn, Gastro-esophageal reflux disease Providers:            [name], MD Referring MD:         Rusty Aus, MD (Referring MD) Medicines:            Propofol per Anesthesia Complications:        No immediate complications. Procedure:            Pre-Anesthesia Assessment:                       - After reviewing the risks and benefits, the patient                        was deemed in satisfactory condition to undergo the                        procedure.                       After obtaining informed consent, the endoscope was                        passed under direct vision. Throughout the procedure,                        the patient's blood pressure, pulse, and oxygen                        saturations were monitored continuously. The Endoscope                        was introduced through the mouth, and advanced to the                        second part of duodenum. The upper GI endoscopy was                        accomplished without difficulty. The patient tolerated                        the procedure well. Findings:      A non-obstructing Schatzki ring was found at the gastroesophageal       junction. At 34-35cm from teeth. Hiatal hernia significant size.      A single 10 mm sessile polyp with no bleeding and no stigmata of recent       bleeding was found in the gastric body. Biopsies were taken with a cold       forceps for histology.      The gastric antrum was normal.      The examined duodenum was normal. Impression:           - Non-obstructing Schatzki ring.                       - A single gastric  polyp. Biopsied.           - Normal antrum.                       - Normal examined duodenum. Recommendation:       - Await pathology results.                       - Perform a colonoscopy as previously scheduled. [name], MD 12/02/2017 7:50:39 AM This report has been signed electronically. Number of Addenda: 0 Note Initiated On: 12/02/2017 7:33 AM      [facility]

## 2017-12-02 NOTE — Anesthesia Preprocedure Evaluation (Signed)
Anesthesia Evaluation  Patient identified by MRN, date of birth, ID band Patient awake    Reviewed: Allergy & Precautions, H&P , NPO status , Patient's Chart, lab work & pertinent test results, reviewed documented beta blocker date and time   History of Anesthesia Complications (+) PONV and history of anesthetic complications  Airway Mallampati: II   Neck ROM: full    Dental  (+) Poor Dentition   Pulmonary neg pulmonary ROS,    Pulmonary exam normal        Cardiovascular Exercise Tolerance: Good hypertension, On Medications + angina with exertion + CAD, + Past MI, + CABG and +CHF  negative cardio ROS Normal cardiovascular exam Rhythm:regular Rate:Normal     Neuro/Psych negative neurological ROS  negative psych ROS   GI/Hepatic negative GI ROS, Neg liver ROS, GERD  Medicated,  Endo/Other  negative endocrine ROSdiabetesHypothyroidism   Renal/GU Renal diseasenegative Renal ROS  negative genitourinary   Musculoskeletal   Abdominal   Peds  Hematology negative hematology ROS (+)   Anesthesia Other Findings Past Medical History: No date: ASCVD (arteriosclerotic cardiovascular disease) No date: Cancer ([place])     Comment:  prostate No date: Chronic kidney disease No date: Colon polyps No date: Constipation No date: Coronary artery disease No date: Diabetes mellitus No date: GERD (gastroesophageal reflux disease) No date: Hemorrhoids No date: Hyperlipidemia No date: Hypertension     Comment:  PCP [name] at [place] No date: Hypothyroidism No date: MI (myocardial infarction) ([place]) No date: PONV (postoperative nausea and vomiting) No date: Thyroid disease Past Surgical History: No date: APPENDECTOMY No date: COLONOSCOPY No date: colonoscopy with polypectomy 08/28/2016: CORONARY ARTERY BYPASS GRAFT; N/A     Comment:  Procedure: CORONARY ARTERY BYPASS GRAFTING (CABG) x               five, using left  internal mammary artery and right leg               greater saphenous vein harvested endoscopically - -LIMA               to LAD, -SVG to OM, -SVG to DIAGONAL, -SEQ SVG to PDA,               PLVB ;  Surgeon: [name], MD;  Location: [facility] OR;              Service: Open Heart Surgery;  Laterality: N/A; 09/29/2013: CYSTOSCOPY; N/A     Comment:  Procedure: CYSTOSCOPY;  Surgeon: [name], MD;              [facility];  Service: Urology;  Laterality: N/A; No date: HERNIA REPAIR     Comment:  inguinal bilaterally 08/27/2016: LEFT HEART CATH AND CORONARY ANGIOGRAPHY; N/A     Comment:  Procedure: Left Heart Cath and Coronary Angiography;                Surgeon: [name], MD;  Location: [place] [facility]               LAB;  Service: Cardiovascular;  Laterality: N/A; 06/25/2011: ROBOT ASSISTED LAPAROSCOPIC RADICAL PROSTATECTOMY     Comment:  Procedure: ROBOTIC ASSISTED LAPAROSCOPIC RADICAL               PROSTATECTOMY LEVEL 2;  Surgeon: [name], MD;                [facility];  Service: Urology;  Laterality: N/A; 08/28/2016: TEE  WITHOUT CARDIOVERSION; N/A     Comment:  Procedure: TRANSESOPHAGEAL ECHOCARDIOGRAM (TEE);                Surgeon: [name], MD;  Location: [place];                Service: Open Heart Surgery;  Laterality: N/A; No date: UPPER GASTROINTESTINAL ENDOSCOPY 09/29/2013: URETHRAL SLING; N/A     Comment:  Procedure: MALE SLING;  Surgeon: [name], MD;              [facility];  Service: Urology;  Laterality: N/A; BMI    Body Mass Index:  27.44 kg/m     Reproductive/Obstetrics negative OB ROS                             Anesthesia Physical Anesthesia Plan  ASA: III  Anesthesia Plan: General   Post-op Pain Management:    Induction:   PONV Risk Score and Plan:   Airway Management Planned:   Additional Equipment:   Intra-op Plan:   Post-operative Plan:   Informed Consent: I have reviewed the  patients History and Physical, chart, labs and discussed the procedure including the risks, benefits and alternatives for the proposed anesthesia with the patient or authorized representative who has indicated his/her understanding and acceptance.   Dental Advisory Given  Plan Discussed with: CRNA  Anesthesia Plan Comments:         Anesthesia Quick Evaluation

## 2017-12-03 ENCOUNTER — Encounter: Payer: Self-pay | Admitting: Unknown Physician Specialty

## 2017-12-03 LAB — SURGICAL PATHOLOGY

## 2018-02-05 IMAGING — CR DG CHEST 1V PORT
1 series · 1 of 1 positions shown · non-contrast
Comparison: 08/30/2016 and earlier.

CLINICAL DATA: 75-year-old male status post CABG on 08/28/2016.
Chest tube removal. Shortness of breath.

EXAM:
PORTABLE CHEST 1 VIEW

[AP]
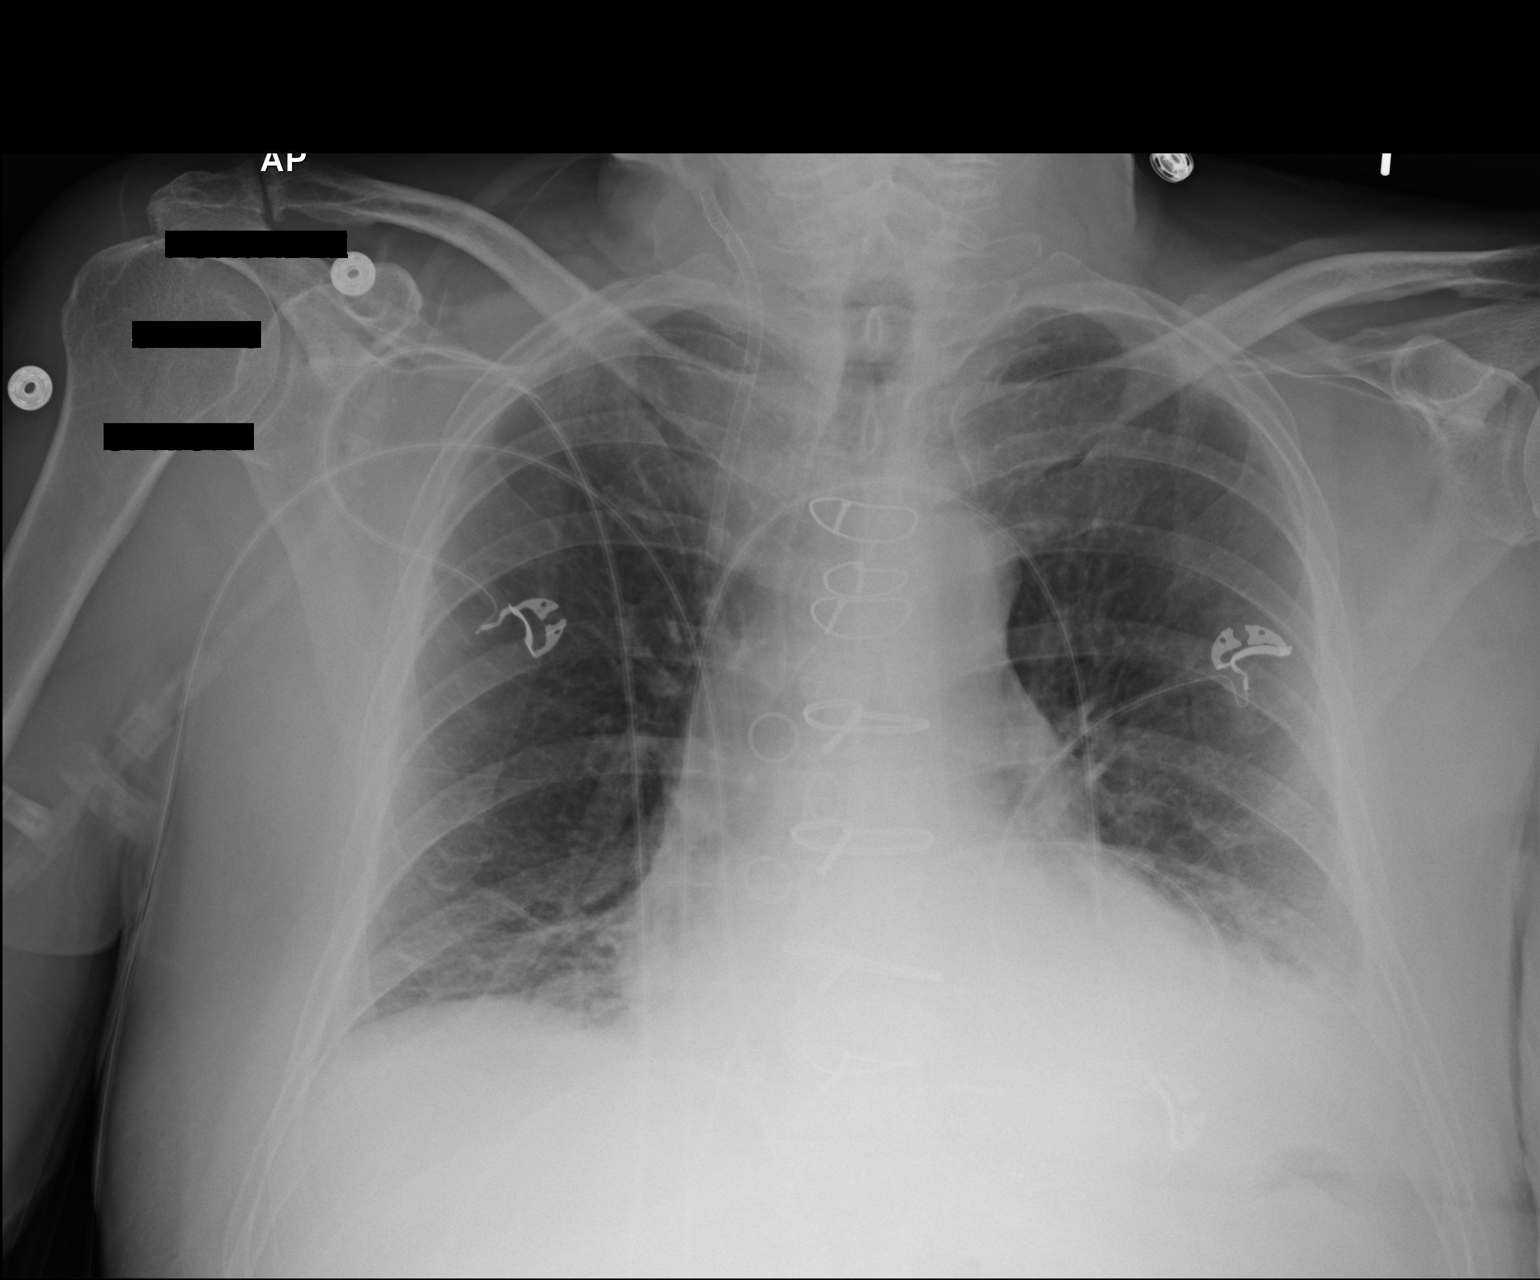

[1 of 1 positions shown; findings below may reference images not displayed]

FINDINGS: Seated upright portable AP view of the chest at 6775 hours. Left
chest tube removed. No pneumothorax. Stable right IJ introducer
sheath. Stable lung volumes. Confluent left lung base opacity. Mild
right lung base opacity. No pulmonary edema. Stable cardiac size and
mediastinal contours. Sequelae of CABG.
IMPRESSION: 1. Left chest tube removed with no pneumothorax.
2. Small left pleural effusion and bibasilar atelectasis.

## 2018-02-06 IMAGING — DX DG CHEST 2V
2 series · 2 of 2 positions shown · non-contrast
Comparison: 08/31/2016 chest radiograph.

CLINICAL DATA: Status post CABG

EXAM:
CHEST  2 VIEW

[w chest pa]
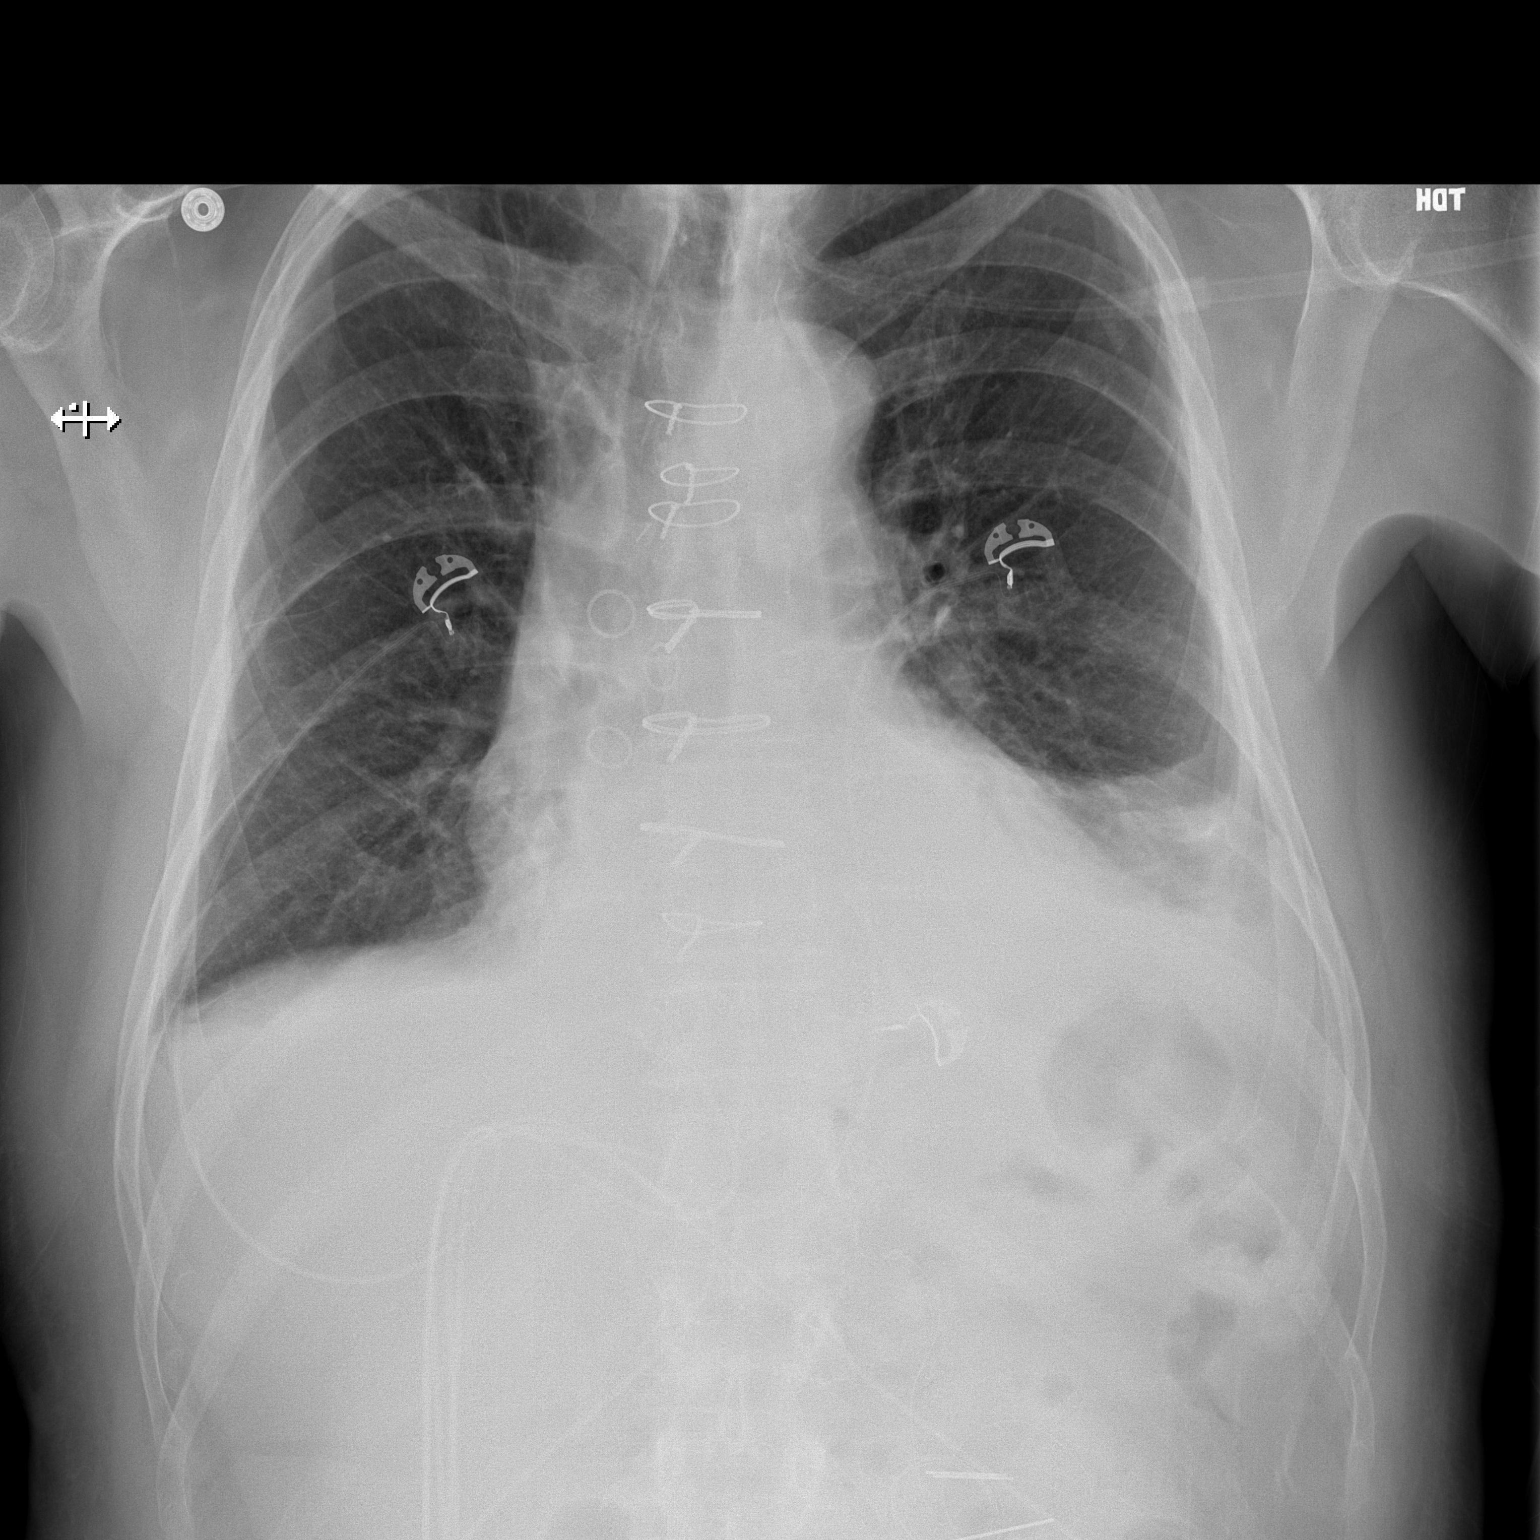

[w chest lat]
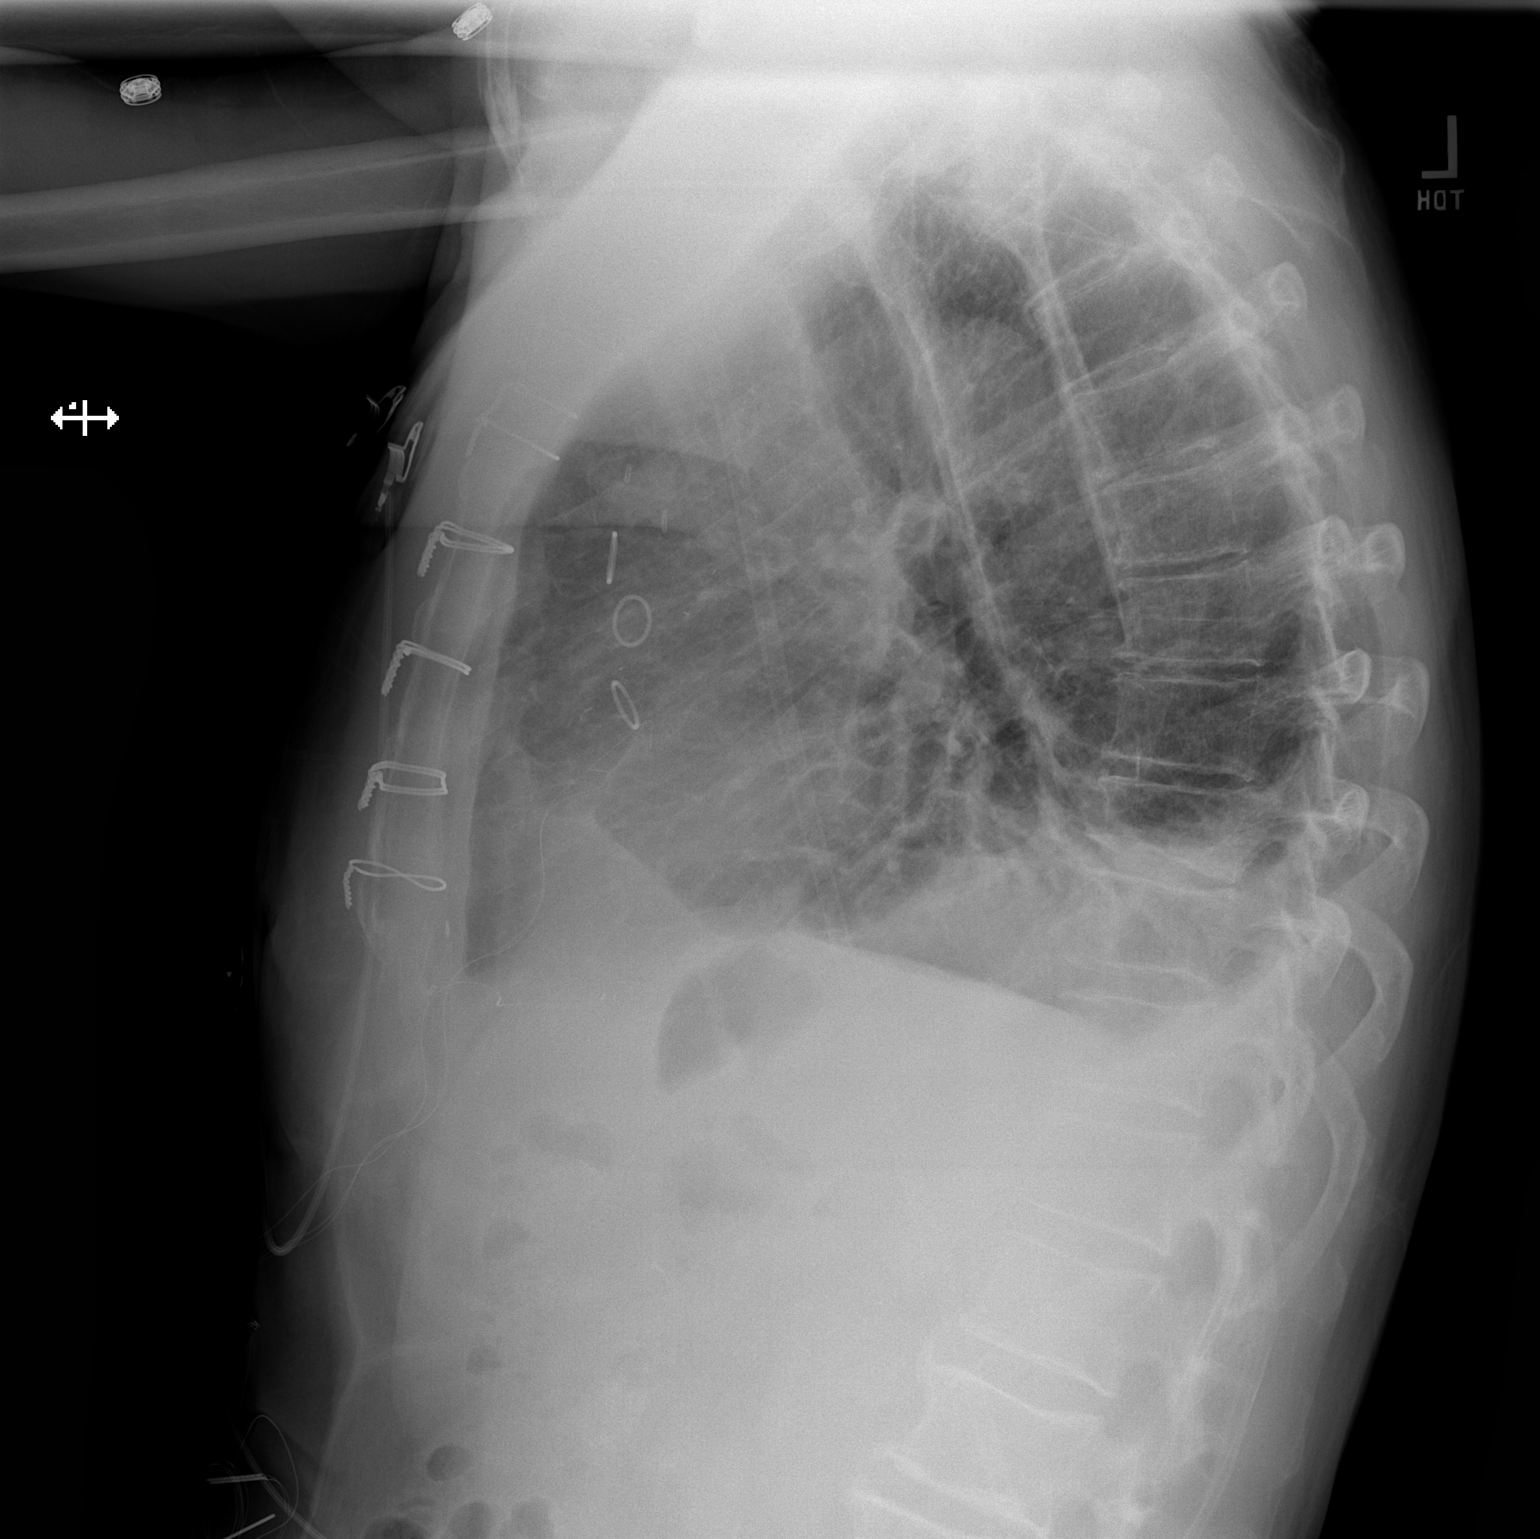

[2 of 2 positions shown; findings below may reference images not displayed]

FINDINGS: Intact median sternotomy wires. Stable cardiomediastinal silhouette
with normal heart size. No pneumothorax. Stable small left pleural
effusion. Stable trace right pleural effusion. No pulmonary edema.
Stable bibasilar atelectasis, left greater than right.
IMPRESSION: 1. No pneumothorax.
2. Stable small left and trace right pleural effusions.
3. Stable left greater than right bibasilar atelectasis.

## 2018-08-12 ENCOUNTER — Ambulatory Visit: Admission: RE | Admit: 2018-08-12 | Payer: Medicare Other | Source: Home / Self Care | Admitting: Ophthalmology

## 2018-08-12 ENCOUNTER — Encounter: Admission: RE | Payer: Self-pay | Source: Home / Self Care

## 2018-08-12 SURGERY — PHACOEMULSIFICATION, CATARACT, WITH IOL INSERTION
Anesthesia: Choice | Laterality: Right

## 2018-10-20 ENCOUNTER — Other Ambulatory Visit: Payer: Self-pay

## 2018-10-20 ENCOUNTER — Encounter: Payer: Self-pay | Admitting: *Deleted

## 2018-10-23 ENCOUNTER — Other Ambulatory Visit: Payer: Self-pay

## 2018-10-23 ENCOUNTER — Other Ambulatory Visit
Admission: RE | Admit: 2018-10-23 | Discharge: 2018-10-23 | Disposition: A | Discharge status: Home / Self Care | Payer: Medicare Other | Source: Ambulatory Visit | Attending: Ophthalmology | Admitting: Ophthalmology

## 2018-10-23 DIAGNOSIS — Z1159 Encounter for screening for other viral diseases: Secondary | ICD-10-CM | POA: Diagnosis present

## 2018-10-24 LAB — NOVEL CORONAVIRUS, NAA (HOSP ORDER, SEND-OUT TO REF LAB; TAT 18-24 HRS): SARS-CoV-2, NAA: NOT DETECTED

## 2018-10-24 NOTE — Discharge Instructions (Signed)

## 2018-10-27 ENCOUNTER — Ambulatory Visit: Payer: Medicare Other | Admitting: Anesthesiology

## 2018-10-27 ENCOUNTER — Encounter
Admission: RE | Disposition: A | Discharge status: Home / Self Care | Payer: Self-pay | Source: Home / Self Care | Attending: Ophthalmology

## 2018-10-27 ENCOUNTER — Other Ambulatory Visit: Payer: Self-pay

## 2018-10-27 ENCOUNTER — Encounter: Payer: Self-pay | Admitting: Certified Registered"

## 2018-10-27 ENCOUNTER — Ambulatory Visit
Admission: RE | Admit: 2018-10-27 | Discharge: 2018-10-27 | Disposition: A | Discharge status: Home / Self Care | Payer: Medicare Other | Attending: Ophthalmology | Admitting: Ophthalmology

## 2018-10-27 DIAGNOSIS — I252 Old myocardial infarction: Secondary | ICD-10-CM | POA: Insufficient documentation

## 2018-10-27 DIAGNOSIS — I2510 Atherosclerotic heart disease of native coronary artery without angina pectoris: Secondary | ICD-10-CM | POA: Insufficient documentation

## 2018-10-27 DIAGNOSIS — Z8546 Personal history of malignant neoplasm of prostate: Secondary | ICD-10-CM | POA: Insufficient documentation

## 2018-10-27 DIAGNOSIS — I110 Hypertensive heart disease with heart failure: Secondary | ICD-10-CM | POA: Diagnosis not present

## 2018-10-27 DIAGNOSIS — K219 Gastro-esophageal reflux disease without esophagitis: Secondary | ICD-10-CM | POA: Insufficient documentation

## 2018-10-27 DIAGNOSIS — H2511 Age-related nuclear cataract, right eye: Secondary | ICD-10-CM | POA: Insufficient documentation

## 2018-10-27 DIAGNOSIS — E1136 Type 2 diabetes mellitus with diabetic cataract: Secondary | ICD-10-CM | POA: Insufficient documentation

## 2018-10-27 DIAGNOSIS — Z882 Allergy status to sulfonamides status: Secondary | ICD-10-CM | POA: Diagnosis not present

## 2018-10-27 DIAGNOSIS — I509 Heart failure, unspecified: Secondary | ICD-10-CM | POA: Diagnosis not present

## 2018-10-27 DIAGNOSIS — E7800 Pure hypercholesterolemia, unspecified: Secondary | ICD-10-CM | POA: Diagnosis not present

## 2018-10-27 DIAGNOSIS — Z951 Presence of aortocoronary bypass graft: Secondary | ICD-10-CM | POA: Diagnosis not present

## 2018-10-27 HISTORY — PX: CATARACT EXTRACTION W/PHACO: SHX586

## 2018-10-27 HISTORY — DX: Dizziness and giddiness: R42

## 2018-10-27 HISTORY — DX: Family history of other specified conditions: Z84.89

## 2018-10-27 LAB — GLUCOSE, CAPILLARY
Glucose-Capillary: 190 mg/dL — ABNORMAL HIGH (ref 70–99)
Glucose-Capillary: 176 mg/dL — ABNORMAL HIGH (ref 70–99)

## 2018-10-27 SURGERY — PHACOEMULSIFICATION, CATARACT, WITH IOL INSERTION
Anesthesia: Monitor Anesthesia Care | Site: Eye | Laterality: Right

## 2018-10-27 MED ORDER — BRIMONIDINE TARTRATE-TIMOLOL 0.2-0.5 % OP SOLN
OPHTHALMIC | Status: DC | PRN
  Administered 2018-10-27: 1 [drp] via OPHTHALMIC

## 2018-10-27 MED ORDER — LIDOCAINE HCL (PF) 2 % IJ SOLN
INTRAOCULAR | Status: DC | PRN
  Administered 2018-10-27: 2 mL

## 2018-10-27 MED ORDER — FENTANYL CITRATE (PF) 100 MCG/2ML IJ SOLN
INTRAMUSCULAR | Status: DC | PRN
  Administered 2018-10-27 (×2): 50 ug via INTRAVENOUS

## 2018-10-27 MED ORDER — MOXIFLOXACIN HCL 0.5 % OP SOLN
OPHTHALMIC | Status: DC | PRN
  Administered 2018-10-27: 0.2 mL via OPHTHALMIC

## 2018-10-27 MED ORDER — ONDANSETRON HCL 4 MG/2ML IJ SOLN: 4.0000 mg | Freq: Once | INTRAMUSCULAR | Status: DC | PRN

## 2018-10-27 MED ORDER — NA CHONDROIT SULF-NA HYALURON 40-17 MG/ML IO SOLN
INTRAOCULAR | Status: DC | PRN
  Administered 2018-10-27: 1 mL via INTRAOCULAR

## 2018-10-27 MED ORDER — ARMC OPHTHALMIC DILATING DROPS
1.0000 "application " | OPHTHALMIC | Status: DC | PRN
  Administered 2018-10-27 (×3): 1 via OPHTHALMIC

## 2018-10-27 MED ORDER — EPINEPHRINE PF 1 MG/ML IJ SOLN
INTRAOCULAR | Status: DC | PRN
  Administered 2018-10-27: 86 mL via OPHTHALMIC

## 2018-10-27 MED ORDER — TETRACAINE HCL 0.5 % OP SOLN
1.0000 [drp] | OPHTHALMIC | Status: DC | PRN
  Administered 2018-10-27 (×3): 1 [drp] via OPHTHALMIC

## 2018-10-27 SURGICAL SUPPLY — 20 items
CANNULA ANT/CHMB 27G (MISCELLANEOUS) ×1 IMPLANT
CANNULA ANT/CHMB 27GA (MISCELLANEOUS) ×6 IMPLANT
GLOVE SURG LX 8.0 MICRO (GLOVE) ×2
GLOVE SURG LX STRL 8.0 MICRO (GLOVE) ×1 IMPLANT
GLOVE SURG TRIUMPH 8.0 PF LTX (GLOVE) ×3 IMPLANT
GOWN STRL REUS W/ TWL LRG LVL3 (GOWN DISPOSABLE) ×2 IMPLANT
GOWN STRL REUS W/TWL LRG LVL3 (GOWN DISPOSABLE) ×4
LENS IOL TECNIS 20.5 (Intraocular Lens) ×3 IMPLANT
LENS IOL TECNIS MONO 1P 20.5 (Intraocular Lens) IMPLANT
MARKER SKIN DUAL TIP RULER LAB (MISCELLANEOUS) ×3 IMPLANT
NDL FILTER BLUNT 18X1 1/2 (NEEDLE) ×1 IMPLANT
NDL RETROBULBAR .5 NSTRL (NEEDLE) ×3 IMPLANT
NEEDLE FILTER BLUNT 18X 1/2SAF (NEEDLE) ×2
NEEDLE FILTER BLUNT 18X1 1/2 (NEEDLE) ×1 IMPLANT
PACK EYE AFTER SURG (MISCELLANEOUS) ×3 IMPLANT
PACK OPTHALMIC (MISCELLANEOUS) ×3 IMPLANT
PACK PORFILIO (MISCELLANEOUS) ×3 IMPLANT
SYR 3ML LL SCALE MARK (SYRINGE) ×3 IMPLANT
SYR TB 1ML LUER SLIP (SYRINGE) ×3 IMPLANT
WIPE NON LINTING 3.25X3.25 (MISCELLANEOUS) ×3 IMPLANT

## 2018-10-27 NOTE — Transfer of Care (Signed)
Immediate Anesthesia Transfer of Care Note  Patient: Victor Gould  Procedure(s) Performed: CATARACT EXTRACTION PHACO AND INTRAOCULAR LENS PLACEMENT (IOC) RIGHT, DIABETIC (Right Eye)  Patient Location: PACU  Anesthesia Type: MAC  Level of Consciousness: awake, alert  and patient cooperative  Airway and Oxygen Therapy: Patient Spontanous Breathing and Patient connected to supplemental oxygen  Post-op Assessment: Post-op Vital signs reviewed, Patient's Cardiovascular Status Stable, Respiratory Function Stable, Patent Airway and No signs of Nausea or vomiting  Post-op Vital Signs: Reviewed and stable  Complications: No apparent anesthesia complications

## 2018-10-27 NOTE — H&P (Signed)
All labs reviewed. Abnormal studies sent to patients PCP when indicated.  Previous H&P reviewed, patient examined, there are NO CHANGES.  Victor Porfilio6/29/20207:27 AM

## 2018-10-27 NOTE — Op Note (Signed)
PREOPERATIVE DIAGNOSIS:  Nuclear sclerotic cataract of the right eye.   POSTOPERATIVE DIAGNOSIS:  H25.11 - CATARACT   OPERATIVE PROCEDURE: Procedure(s): CATARACT EXTRACTION PHACO AND INTRAOCULAR LENS PLACEMENT (IOC) RIGHT, DIABETIC   SURGEON:  [name], MD.   ANESTHESIA:  Anesthesiologist: Veda Canning, MD CRNA: Jeannene Patella, CRNA  1.      Managed anesthesia care. 2.      0.56ml of Shugarcaine was instilled in the eye following the paracentesis.   COMPLICATIONS:  None.   TECHNIQUE:   Stop and chop   DESCRIPTION OF PROCEDURE:  The patient was examined and consented in the preoperative holding area where the aforementioned topical anesthesia was applied to the right eye and then brought back to the Operating Room where the right eye was prepped and draped in the usual sterile ophthalmic fashion and a lid speculum was placed. A paracentesis was created with the side port blade and the anterior chamber was filled with viscoelastic. A near clear corneal incision was performed with the steel keratome. A continuous curvilinear capsulorrhexis was performed with a cystotome followed by the capsulorrhexis forceps. Hydrodissection and hydrodelineation were carried out with BSS on a blunt cannula. The lens was removed in a stop and chop  technique and the remaining cortical material was removed with the irrigation-aspiration handpiece. The capsular bag was inflated with viscoelastic and the Technis [id number]  lens was placed in the capsular bag without complication. The remaining viscoelastic was removed from the eye with the irrigation-aspiration handpiece. The wounds were hydrated. The anterior chamber was flushed with BSS  and the eye was inflated to physiologic pressure. 0.29ml of Vigamox was placed in the anterior chamber. The wounds were found to be water tight. The eye was dressed with [place]. The patient was given protective glasses to wear throughout the day and a shield with which to  sleep tonight. The patient was also given drops with which to begin a drop regimen today and will follow-up with me in one day. Implant Name Type Inv. Item Serial No. Manufacturer Lot No. LRB No. Used Action  LENS IOL TECNIS 20.5 - [id number] Intraocular Lens LENS IOL TECNIS 20.5 [id number] AMO  Right 1 Implanted   Procedure(s) with comments: CATARACT EXTRACTION PHACO AND INTRAOCULAR LENS PLACEMENT (IOC) RIGHT, DIABETIC (Right) - diabetic - insulin  Electronically signed: Wade Asebedo 10/27/2018 7:56 AM

## 2018-10-27 NOTE — Anesthesia Preprocedure Evaluation (Signed)
Anesthesia Evaluation  Patient identified by MRN, date of birth, ID band Patient awake    Reviewed: Allergy & Precautions, NPO status , Patient's Chart, lab work & pertinent test results  Airway Mallampati: II  TM Distance: >3 FB     Dental   Pulmonary    breath sounds clear to auscultation       Cardiovascular hypertension, + CAD, + Past MI, + CABG (2018) and +CHF   Rhythm:Regular Rate:Normal     Neuro/Psych    GI/Hepatic GERD  ,  Endo/Other  diabetesHypothyroidism   Renal/GU Renal InsufficiencyRenal disease     Musculoskeletal   Abdominal   Peds  Hematology   Anesthesia Other Findings   Reproductive/Obstetrics                             Anesthesia Physical Anesthesia Plan  ASA: III  Anesthesia Plan: MAC   Post-op Pain Management:    Induction: Intravenous  PONV Risk Score and Plan:   Airway Management Planned: Natural Airway and Nasal Cannula  Additional Equipment:   Intra-op Plan:   Post-operative Plan:   Informed Consent: I have reviewed the patients History and Physical, chart, labs and discussed the procedure including the risks, benefits and alternatives for the proposed anesthesia with the patient or authorized representative who has indicated his/her understanding and acceptance.       Plan Discussed with: CRNA  Anesthesia Plan Comments:         Anesthesia Quick Evaluation

## 2018-10-27 NOTE — Anesthesia Postprocedure Evaluation (Signed)
Anesthesia Post Note  Patient: Victor Gould  Procedure(s) Performed: CATARACT EXTRACTION PHACO AND INTRAOCULAR LENS PLACEMENT (IOC) RIGHT, DIABETIC (Right Eye)  Patient location during evaluation: PACU Anesthesia Type: MAC Level of consciousness: awake and alert Pain management: pain level controlled Vital Signs Assessment: post-procedure vital signs reviewed and stable Respiratory status: spontaneous breathing, nonlabored ventilation, respiratory function stable and patient connected to nasal cannula oxygen Cardiovascular status: stable and blood pressure returned to baseline Postop Assessment: no apparent nausea or vomiting Anesthetic complications: no    Veda Canning

## 2018-10-28 ENCOUNTER — Encounter: Payer: Self-pay | Admitting: Ophthalmology

## 2018-11-20 ENCOUNTER — Other Ambulatory Visit: Payer: Self-pay

## 2018-11-20 ENCOUNTER — Other Ambulatory Visit
Admission: RE | Admit: 2018-11-20 | Discharge: 2018-11-20 | Disposition: A | Discharge status: Home / Self Care | Payer: Medicare Other | Source: Ambulatory Visit | Attending: Ophthalmology | Admitting: Ophthalmology

## 2018-11-20 DIAGNOSIS — Z1159 Encounter for screening for other viral diseases: Secondary | ICD-10-CM | POA: Diagnosis present

## 2018-11-20 LAB — SARS CORONAVIRUS 2 (TAT 6-24 HRS): SARS Coronavirus 2: NEGATIVE

## 2018-11-20 NOTE — Discharge Instructions (Signed)

## 2018-11-25 ENCOUNTER — Ambulatory Visit: Payer: Medicare Other | Admitting: Anesthesiology

## 2018-11-25 ENCOUNTER — Encounter
Admission: RE | Disposition: A | Discharge status: Home / Self Care | Payer: Self-pay | Source: Home / Self Care | Attending: Ophthalmology

## 2018-11-25 ENCOUNTER — Other Ambulatory Visit: Payer: Self-pay

## 2018-11-25 ENCOUNTER — Ambulatory Visit
Admission: RE | Admit: 2018-11-25 | Discharge: 2018-11-25 | Disposition: A | Discharge status: Home / Self Care | Payer: Medicare Other | Attending: Ophthalmology | Admitting: Ophthalmology

## 2018-11-25 DIAGNOSIS — Z794 Long term (current) use of insulin: Secondary | ICD-10-CM | POA: Insufficient documentation

## 2018-11-25 DIAGNOSIS — E039 Hypothyroidism, unspecified: Secondary | ICD-10-CM | POA: Diagnosis not present

## 2018-11-25 DIAGNOSIS — Z79899 Other long term (current) drug therapy: Secondary | ICD-10-CM | POA: Insufficient documentation

## 2018-11-25 DIAGNOSIS — Z951 Presence of aortocoronary bypass graft: Secondary | ICD-10-CM | POA: Insufficient documentation

## 2018-11-25 DIAGNOSIS — Z79890 Hormone replacement therapy (postmenopausal): Secondary | ICD-10-CM | POA: Insufficient documentation

## 2018-11-25 DIAGNOSIS — K219 Gastro-esophageal reflux disease without esophagitis: Secondary | ICD-10-CM | POA: Insufficient documentation

## 2018-11-25 DIAGNOSIS — Z7982 Long term (current) use of aspirin: Secondary | ICD-10-CM | POA: Insufficient documentation

## 2018-11-25 DIAGNOSIS — I110 Hypertensive heart disease with heart failure: Secondary | ICD-10-CM | POA: Diagnosis not present

## 2018-11-25 DIAGNOSIS — Z8546 Personal history of malignant neoplasm of prostate: Secondary | ICD-10-CM | POA: Diagnosis not present

## 2018-11-25 DIAGNOSIS — E7800 Pure hypercholesterolemia, unspecified: Secondary | ICD-10-CM | POA: Insufficient documentation

## 2018-11-25 DIAGNOSIS — H2512 Age-related nuclear cataract, left eye: Secondary | ICD-10-CM | POA: Diagnosis not present

## 2018-11-25 DIAGNOSIS — E1136 Type 2 diabetes mellitus with diabetic cataract: Secondary | ICD-10-CM | POA: Insufficient documentation

## 2018-11-25 DIAGNOSIS — I2510 Atherosclerotic heart disease of native coronary artery without angina pectoris: Secondary | ICD-10-CM | POA: Insufficient documentation

## 2018-11-25 DIAGNOSIS — I252 Old myocardial infarction: Secondary | ICD-10-CM | POA: Diagnosis not present

## 2018-11-25 DIAGNOSIS — I509 Heart failure, unspecified: Secondary | ICD-10-CM | POA: Diagnosis not present

## 2018-11-25 HISTORY — PX: CATARACT EXTRACTION W/PHACO: SHX586

## 2018-11-25 LAB — GLUCOSE, CAPILLARY
Glucose-Capillary: 157 mg/dL — ABNORMAL HIGH (ref 70–99)
Glucose-Capillary: 147 mg/dL — ABNORMAL HIGH (ref 70–99)

## 2018-11-25 SURGERY — PHACOEMULSIFICATION, CATARACT, WITH IOL INSERTION
Anesthesia: Monitor Anesthesia Care | Site: Eye | Laterality: Left

## 2018-11-25 MED ORDER — TETRACAINE HCL 0.5 % OP SOLN
1.0000 [drp] | OPHTHALMIC | Status: DC | PRN
  Administered 2018-11-25 (×3): 1 [drp] via OPHTHALMIC

## 2018-11-25 MED ORDER — LIDOCAINE HCL (PF) 2 % IJ SOLN
INTRAOCULAR | Status: DC | PRN
  Administered 2018-11-25: 2 mL

## 2018-11-25 MED ORDER — MIDAZOLAM HCL 2 MG/2ML IJ SOLN
INTRAMUSCULAR | Status: DC | PRN
  Administered 2018-11-25: 1.5 mg via INTRAVENOUS

## 2018-11-25 MED ORDER — LACTATED RINGERS IV SOLN: INTRAVENOUS | Status: DC

## 2018-11-25 MED ORDER — MOXIFLOXACIN HCL 0.5 % OP SOLN
OPHTHALMIC | Status: DC | PRN
  Administered 2018-11-25: 0.2 mL via OPHTHALMIC

## 2018-11-25 MED ORDER — BRIMONIDINE TARTRATE-TIMOLOL 0.2-0.5 % OP SOLN
OPHTHALMIC | Status: DC | PRN
  Administered 2018-11-25: 1 [drp] via OPHTHALMIC

## 2018-11-25 MED ORDER — FENTANYL CITRATE (PF) 100 MCG/2ML IJ SOLN
INTRAMUSCULAR | Status: DC | PRN
  Administered 2018-11-25: 50 ug via INTRAVENOUS

## 2018-11-25 MED ORDER — ARMC OPHTHALMIC DILATING DROPS
1.0000 "application " | OPHTHALMIC | Status: DC | PRN
  Administered 2018-11-25 (×3): 1 via OPHTHALMIC

## 2018-11-25 MED ORDER — NA CHONDROIT SULF-NA HYALURON 40-17 MG/ML IO SOLN
INTRAOCULAR | Status: DC | PRN
  Administered 2018-11-25: 1 mL via INTRAOCULAR

## 2018-11-25 MED ORDER — EPINEPHRINE PF 1 MG/ML IJ SOLN
INTRAOCULAR | Status: DC | PRN
  Administered 2018-11-25: 99 mL via OPHTHALMIC

## 2018-11-25 MED ORDER — ACETAMINOPHEN 160 MG/5ML PO SOLN: 325.0000 mg | Freq: Once | ORAL | Status: DC

## 2018-11-25 MED ORDER — ACETAMINOPHEN 325 MG PO TABS: 325.0000 mg | ORAL_TABLET | Freq: Once | ORAL | Status: DC

## 2018-11-25 SURGICAL SUPPLY — 19 items

## 2018-11-25 NOTE — H&P (Signed)
  All labs reviewed. Abnormal studies sent to patients PCP when indicated.  Previous H&P reviewed, patient examined, there are NO CHANGES.  Victor Porfilio7/28/20207:56 AM

## 2018-11-25 NOTE — Anesthesia Preprocedure Evaluation (Signed)
Anesthesia Evaluation  Patient identified by MRN, date of birth, ID band Patient awake    Reviewed: Allergy & Precautions, H&P , NPO status , Patient's Chart, lab work & pertinent test results  Airway Mallampati: II  TM Distance: >3 FB Neck ROM: full    Dental no notable dental hx.    Pulmonary    Pulmonary exam normal breath sounds clear to auscultation       Cardiovascular hypertension, + CAD, + Past MI, + CABG (2018) and +CHF  Normal cardiovascular exam Rhythm:regular Rate:Normal     Neuro/Psych    GI/Hepatic GERD  ,  Endo/Other  diabetesHypothyroidism   Renal/GU Renal InsufficiencyRenal disease     Musculoskeletal   Abdominal   Peds  Hematology   Anesthesia Other Findings   Reproductive/Obstetrics                             Anesthesia Physical  Anesthesia Plan  ASA: III  Anesthesia Plan: MAC   Post-op Pain Management:    Induction: Intravenous  PONV Risk Score and Plan: 1 and Midazolam, Treatment may vary due to age or medical condition and TIVA  Airway Management Planned: Natural Airway and Nasal Cannula  Additional Equipment:   Intra-op Plan:   Post-operative Plan:   Informed Consent: I have reviewed the patients History and Physical, chart, labs and discussed the procedure including the risks, benefits and alternatives for the proposed anesthesia with the patient or authorized representative who has indicated his/her understanding and acceptance.       Plan Discussed with: CRNA  Anesthesia Plan Comments:         Anesthesia Quick Evaluation

## 2018-11-25 NOTE — Anesthesia Procedure Notes (Signed)
Procedure Name: MAC Performed by: Lasheba Stevens, CRNA Pre-anesthesia Checklist: Patient identified, Emergency Drugs available, Suction available, Timeout performed and Patient being monitored Patient Re-evaluated:Patient Re-evaluated prior to induction Oxygen Delivery Method: Nasal cannula Placement Confirmation: positive ETCO2       

## 2018-11-25 NOTE — Anesthesia Postprocedure Evaluation (Signed)
Anesthesia Post Note  Patient: Victor Gould  Procedure(s) Performed: CATARACT EXTRACTION PHACO AND INTRAOCULAR LENS PLACEMENT (IOC) LEFT DIABETIC (Left Eye)  Patient location during evaluation: PACU Anesthesia Type: MAC Level of consciousness: awake and alert and oriented Pain management: satisfactory to patient Vital Signs Assessment: post-procedure vital signs reviewed and stable Respiratory status: spontaneous breathing, nonlabored ventilation and respiratory function stable Cardiovascular status: blood pressure returned to baseline and stable Postop Assessment: Adequate PO intake and No signs of nausea or vomiting Anesthetic complications: no    Raliegh Ip

## 2018-11-25 NOTE — Op Note (Signed)
PREOPERATIVE DIAGNOSIS:  Nuclear sclerotic cataract of the left eye.   POSTOPERATIVE DIAGNOSIS:  Nuclear sclerotic cataract of the left eye.   OPERATIVE PROCEDURE: Procedure(s): CATARACT EXTRACTION PHACO AND INTRAOCULAR LENS PLACEMENT ([place]) LEFT DIABETIC   SURGEON:  [name], MD.   ANESTHESIA:  Anesthesiologist: Ronelle Nigh, MD CRNA: Mayme Genta, CRNA  1.      Managed anesthesia care. 2.     0.48ml of Shugarcaine was instilled following the paracentesis   COMPLICATIONS:  None.   TECHNIQUE:   Stop and chop   DESCRIPTION OF PROCEDURE:  The patient was examined and consented in the preoperative holding area where the aforementioned topical anesthesia was applied to the left eye and then brought back to the Operating Room where the left eye was prepped and draped in the usual sterile ophthalmic fashion and a lid speculum was placed. A paracentesis was created with the side port blade and the anterior chamber was filled with viscoelastic. A near clear corneal incision was performed with the steel keratome. A continuous curvilinear capsulorrhexis was performed with a cystotome followed by the capsulorrhexis forceps. Hydrodissection and hydrodelineation were carried out with BSS on a blunt cannula. The lens was removed in a stop and chop  technique and the remaining cortical material was removed with the irrigation-aspiration handpiece. The capsular bag was inflated with viscoelastic and the Technis ZCB00 lens was placed in the capsular bag without complication. The remaining viscoelastic was removed from the eye with the irrigation-aspiration handpiece. The wounds were hydrated. The anterior chamber was flushed with BSS and the eye was inflated to physiologic pressure. 0.19ml Vigamox was placed in the anterior chamber. The wounds were found to be water tight. The eye was dressed with [place]. The patient was given protective glasses to wear throughout the day and a shield with which to  sleep tonight. The patient was also given drops with which to begin a drop regimen today and will follow-up with me in one day. Implant Name Type Inv. Item Serial No. Manufacturer Lot No. LRB No. Used Action  LENS IOL DIOP 21.0 - [id number] Intraocular Lens LENS IOL DIOP 21.0 [id number] AMO  Left 1 Implanted    Procedure(s): CATARACT EXTRACTION PHACO AND INTRAOCULAR LENS PLACEMENT (IOC) LEFT DIABETIC (Left)  Electronically signed: [name] 11/25/2018 8:21 AM

## 2018-11-25 NOTE — Transfer of Care (Signed)
Immediate Anesthesia Transfer of Care Note  Patient: Victor Gould  Procedure(s) Performed: CATARACT EXTRACTION PHACO AND INTRAOCULAR LENS PLACEMENT (IOC) LEFT DIABETIC (Left Eye)  Patient Location: PACU  Anesthesia Type: MAC  Level of Consciousness: awake, alert  and patient cooperative  Airway and Oxygen Therapy: Patient Spontanous Breathing and Patient connected to supplemental oxygen  Post-op Assessment: Post-op Vital signs reviewed, Patient's Cardiovascular Status Stable, Respiratory Function Stable, Patent Airway and No signs of Nausea or vomiting  Post-op Vital Signs: Reviewed and stable  Complications: No apparent anesthesia complications

## 2018-11-26 ENCOUNTER — Encounter: Payer: Self-pay | Admitting: Ophthalmology

## 2018-12-06 NOTE — Progress Notes (Signed)
Cardiology Office Note  Date:  12/08/2018   ID:  Victor Gould, Victor Gould December 23, 1940, MRN [id number]  PCP:  Victor Aus, MD   Chief Complaint  Patient presents with  . Other    12 month follow up. Patient c/o Dizziness and lightheadedness. Patient deneis chest pain and SOB at this time. Meds reviewed verbally with patient.     HPI:  Victor Gould is a pleasant 78 year old gentleman with past medical history of CKD, creatinine 2.1 Diabetes, hemoglobin A1c 7.0 S/p NSTEMI.  s/p CABG x 5 on 08/25/16 PAF -  Essential hypertension H/O prostate cancer S/P Radical Prostatectomy  Chronic diastolic CHF - LVEF 46-50% Who presents for follow-up of his coronary disease, bypass surgery  Dizzy episodes at home, etiology unclear Blood pressure Numbers today dropping 136 to 115 with standing Takes a few minutes to recover  Lab work reviewed Total chol 109, LDL 50 HBA1C 8.2 Cr 2.0 GFR 21  Continues to have rare Diarrhea,  "fruit bothers him",  Dr. Weston Gould evaluated him for diarrhea in 04/2016, 10/2015  No chest pain, or SOB on exertion  Tingling in fingertips with heavy walking  EKG personally reviewed by myself on todays visit Shows normal sinus rhythm with rate 66 bpm with no significant ST or T wave changes  Hospital Records reviewed with him in detail Cath   08/27/2016  Severe three-vessel coronary disease with total occlusion of the proximal LAD after the first diagonal, high-grade obstruction with superimposed thrombus in the large first diagonal which collateralizes the distal LAD, 80-90% obstruction in a small to moderate ramus intermedius, 70% stenosis and small first obtuse marginal, greater than 90% obstruction in the ostium to proximal PDA and 70% obstruction in the ongoing right coronary before a large left ventricular branch.  Coronary artery bypass grafting x5 with the left internal mammary to the left anterior descending coronary artery, reverse saphenous vein graft to the  diagonal coronary artery, reverse saphenous vein graft to the obtuse marginal coronary artery, sequential reverse saphenous vein graft to the posterior descending and posterior branches of the right coronary artery  PMH:   has a past medical history of ASCVD (arteriosclerotic cardiovascular disease), Cancer ([place]), Chronic kidney disease, Colon polyps, Constipation, Coronary artery disease, Diabetes mellitus, Family history of adverse reaction to anesthesia, GERD (gastroesophageal reflux disease), Hemorrhoids, Hyperlipidemia, Hypertension, Hypothyroidism, MI (myocardial infarction) ([place]), PONV (postoperative nausea and vomiting), Thyroid disease, and Vertigo.  PSH:    Past Surgical History:  Procedure Laterality Date  . APPENDECTOMY    . CATARACT EXTRACTION W/PHACO Right 10/27/2018   Procedure: CATARACT EXTRACTION PHACO AND INTRAOCULAR LENS PLACEMENT (IOC) RIGHT, DIABETIC;  Surgeon: Victor Robson, MD;  Location: [place] [place];  Service: Ophthalmology;  Laterality: Right;  diabetic - insulin  . CATARACT EXTRACTION W/PHACO Left 11/25/2018   Procedure: CATARACT EXTRACTION PHACO AND INTRAOCULAR LENS PLACEMENT ([place]) LEFT DIABETIC;  Surgeon: Victor Robson, MD;  Location: [place];  Service: Ophthalmology;  Laterality: Left;  . COLONOSCOPY    . colonoscopy with polypectomy    . COLONOSCOPY WITH PROPOFOL N/A 12/02/2017   Procedure: COLONOSCOPY WITH PROPOFOL;  Surgeon: Victor Silvas, MD;  Location: [facility];  Service: Endoscopy;  Laterality: N/A;  . CORONARY ARTERY BYPASS GRAFT N/A 08/28/2016   Procedure: CORONARY ARTERY BYPASS GRAFTING (CABG) x five, using left internal mammary artery and right leg greater saphenous vein harvested endoscopically - -LIMA to LAD, -SVG to OM, -SVG to DIAGONAL, -SEQ SVG to PDA, PLVB ;  Surgeon: Victor Argue  Servando Snare, MD;  Location: [facility] [place];  Service: Open Heart Surgery;  Laterality: N/A;  . CYSTOSCOPY N/A 09/29/2013   Procedure: CYSTOSCOPY;  Surgeon:  Victor Packer, MD;  Location: [facility];  Service: Urology;  Laterality: N/A;  . ESOPHAGOGASTRODUODENOSCOPY (EGD) WITH PROPOFOL N/A 12/02/2017   Procedure: ESOPHAGOGASTRODUODENOSCOPY (EGD) WITH PROPOFOL;  Surgeon: Victor Silvas, MD;  Location: [facility] ENDOSCOPY;  Service: Endoscopy;  Laterality: N/A;  . HERNIA REPAIR     inguinal bilaterally  . LEFT HEART CATH AND CORONARY ANGIOGRAPHY N/A 08/27/2016   Procedure: Left Heart Cath and Coronary Angiography;  Surgeon: Victor Crome, MD;  Location: [facility];  Service: Cardiovascular;  Laterality: N/A;  . ROBOT ASSISTED LAPAROSCOPIC RADICAL PROSTATECTOMY  06/25/2011   Procedure: ROBOTIC ASSISTED LAPAROSCOPIC RADICAL PROSTATECTOMY LEVEL 2;  Surgeon: Victor Gray, MD;  Location: [facility];  Service: Urology;  Laterality: N/A;  . TEE WITHOUT CARDIOVERSION N/A 08/28/2016   Procedure: TRANSESOPHAGEAL ECHOCARDIOGRAM (TEE);  Surgeon: Victor Isaac, MD;  Location: [place];  Service: Open Heart Surgery;  Laterality: N/A;  . UPPER GASTROINTESTINAL ENDOSCOPY    . URETHRAL SLING N/A 09/29/2013   Procedure: MALE SLING;  Surgeon: Victor Packer, MD;  Location: [facility];  Service: Urology;  Laterality: N/A;    Current Outpatient Medications  Medication Sig Dispense Refill  . acetaminophen (TYLENOL) 500 MG tablet Take 1,000 mg by mouth daily as needed for headache (pain).    Marland Kitchen aspirin EC 81 MG tablet Take 81 mg by mouth daily.    Marland Kitchen atorvastatin (LIPITOR) 40 MG tablet Take 1 tablet (40 mg total) by mouth daily at 6 PM. 30 tablet 1  . b complex vitamins tablet Take 1 tablet by mouth daily after breakfast.    . cholecalciferol (VITAMIN D) 1000 units tablet Take 1,000 Units by mouth daily after breakfast.    . clopidogrel (PLAVIX) 75 MG tablet Take 75 mg by mouth daily.    . hydrocortisone cream 1 % Apply 1 application topically daily as needed for itching.    . insulin glargine (LANTUS) 100 unit/mL SOPN Inject 17 Units into the skin daily before breakfast.    .  insulin lispro (HUMALOG KWIKPEN) 100 UNIT/ML KiwkPen Inject 7-10 Units into the skin See admin instructions. Inject 7 units subcutaneously before breakfast, lunch and dinner - plus adjustment for CBG 140-180 add 1 unit, 180-220 add 3 units, >220 add 3 units    . levothyroxine (SYNTHROID, LEVOTHROID) 75 MCG tablet Take 75 mcg by mouth daily before breakfast.    . losartan (COZAAR) 25 MG tablet Take 1 tablet (25 mg total) by mouth daily after breakfast. 30 tablet 1  . Melatonin 3 MG TABS Take by mouth at bedtime as needed.    . metoprolol succinate (TOPROL XL) 25 MG 24 hr tablet Take 1 tablet (25 mg total) by mouth daily. 90 tablet 3  . Multiple Vitamin (MULTIVITAMIN WITH MINERALS) TABS tablet Take 1 tablet by mouth daily after breakfast.    . omeprazole (PRILOSEC) 20 MG capsule Take 20 mg by mouth daily before breakfast.     . Polyethyl Glycol-Propyl Glycol (SYSTANE FREE OP) Apply to eye as needed.    . Probiotic Product (PROBIOTIC DAILY PO) Take by mouth.    Marland Kitchen amLODipine (NORVASC) 10 MG tablet Take 1 tablet (10 mg total) by mouth daily. 180 tablet 3   No current facility-administered medications for this visit.      Allergies:   Naproxen sodium, Other, Oxycodone hcl, Sulfa  antibiotics, and Tramadol   Social History:  The patient  reports that he has never smoked. He has never used smokeless tobacco. He reports that he does not drink alcohol or use drugs.   Family History:   family history includes Heart attack in his brother; Heart attack (age of onset: 78) in his mother.    Review of Systems: Review of Systems  Constitutional: Negative.   Respiratory: Negative.   Cardiovascular: Positive for chest pain.  Gastrointestinal: Negative.   Musculoskeletal: Negative.   Neurological: Negative.   Psychiatric/Behavioral: Negative.   All other systems reviewed and are negative.   PHYSICAL EXAM: VS:  BP (!) 146/66 (BP Location: Left Arm, Patient Position: Sitting, Cuff Size: Normal)   Pulse  66   Ht 5\' 9"  (1.753 m)   Wt 178 lb 4 oz (80.9 kg)   BMI 26.32 kg/m  , BMI Body mass index is 26.32 kg/m. Constitutional:  oriented to person, place, and time. No distress.  HENT:  Head: Grossly normal Eyes:  no discharge. No scleral icterus.  Neck: No JVD, no carotid bruits  Cardiovascular: Regular rate and rhythm, no murmurs appreciated Pulmonary/Chest: Clear to auscultation bilaterally, no wheezes or rails Abdominal: Soft.  no distension.  no tenderness.  Musculoskeletal: Normal range of motion Neurological:  normal muscle tone. Coordination normal. No atrophy Skin: Skin warm and dry Psychiatric: normal affect, pleasant   Recent Labs: No results found for requested labs within last 8760 hours.    Lipid Panel Lab Results  Component Value Date   CHOL 164 08/26/2016   HDL 44 08/26/2016   LDLCALC 103 (H) 08/26/2016   TRIG 84 08/26/2016      Wt Readings from Last 3 Encounters:  12/08/18 178 lb 4 oz (80.9 kg)  11/25/18 177 lb (80.3 kg)  10/27/18 177 lb (80.3 kg)     ASSESSMENT AND PLAN:  Coronary artery disease of native artery of native heart with stable angina pectoris (HCC) -  Currently with no symptoms of angina. No further workup at this time. Continue current medication regimen. Stable  S/P CABG x 5 - Plan: EKG 12-Lead No anginal symptoms, no further work-up at this time  Chronic renal disease, stage 3, moderately decreased glomerular filtration rate between 30-59 mL/min/1.73 square meter - Plan: EKG 12-Lead Creatinine  2.0 stable, trend upward started approximately 2 years ago He will avoid NSAIDs  Type 2 diabetes mellitus without complication, unspecified whether long term insulin use ([place]) - Plan: EKG 12-Lead Hemoglobin A1c 8 Discussed dietary changes with him,  Managed by Dr.Solum  Chronic diastolic CHF (congestive heart failure) ([place]) - Plan: EKG 12-Lead Continues to appear euvolemic, ejection fraction 50-55%  Stable  Paroxysmal atrial  fibrillation ([place])  after bypass surgery, currently not on anticoagulation Amiodarone held previously On low-dose beta blocker, metoprolol succinate 25 mg daily  Hypertension Decrease Norvasc to 5 given orthostasis He will call us if dizziness symptoms persist  Disposition:   F/U  12 months   Total encounter time more than 25 minutes  Greater than 50% was spent in counseling and coordination of care with the patient    Orders Placed This Encounter  Procedures  . EKG 12-Lead     Signed, Victor Gould, M.D., Ph.D. 12/08/2018  [place], [place]

## 2018-12-08 ENCOUNTER — Other Ambulatory Visit: Payer: Self-pay

## 2018-12-08 ENCOUNTER — Ambulatory Visit (INDEPENDENT_AMBULATORY_CARE_PROVIDER_SITE_OTHER): Payer: Medicare Other | Admitting: Cardiovascular Disease

## 2018-12-08 ENCOUNTER — Encounter: Payer: Self-pay | Admitting: Cardiovascular Disease

## 2018-12-08 VITALS — BP 146/66 | HR 66 | Ht 69.0 in | Wt 178.2 lb

## 2018-12-08 DIAGNOSIS — Z951 Presence of aortocoronary bypass graft: Secondary | ICD-10-CM | POA: Diagnosis not present

## 2018-12-08 DIAGNOSIS — I25118 Atherosclerotic heart disease of native coronary artery with other forms of angina pectoris: Secondary | ICD-10-CM

## 2018-12-08 DIAGNOSIS — E119 Type 2 diabetes mellitus without complications: Secondary | ICD-10-CM

## 2018-12-08 DIAGNOSIS — I10 Essential (primary) hypertension: Secondary | ICD-10-CM

## 2018-12-08 DIAGNOSIS — E782 Mixed hyperlipidemia: Secondary | ICD-10-CM

## 2018-12-08 DIAGNOSIS — I480 Paroxysmal atrial fibrillation: Secondary | ICD-10-CM | POA: Diagnosis not present

## 2018-12-08 DIAGNOSIS — N183 Chronic kidney disease, stage 3 unspecified: Secondary | ICD-10-CM

## 2018-12-08 DIAGNOSIS — I5032 Chronic diastolic (congestive) heart failure: Secondary | ICD-10-CM

## 2018-12-08 MED ORDER — AMLODIPINE BESYLATE 5 MG PO TABS: 5.0000 mg | ORAL_TABLET | Freq: Every day | ORAL | 3 refills | Status: DC

## 2018-12-08 NOTE — Patient Instructions (Addendum)
Medication Instructions:  Your physician has recommended you make the following change in your medication:  1. DECREASE Amlodipine to 5 mg once daily   If you need a refill on your cardiac medications before your next appointment, please call your pharmacy.    Lab work: No new labs needed   If you have labs (blood work) drawn today and your tests are completely normal, you will receive your results only by: Marland Kitchen MyChart Message (if you have MyChart) OR . A paper copy in the mail If you have any lab test that is abnormal or we need to change your treatment, we will call you to review the results.   Testing/Procedures: No new testing needed   Follow-Up: At [facility], you and your health needs are our priority.  As part of our continuing mission to provide you with exceptional heart care, we have created designated Provider Care Teams.  These Care Teams include your primary Cardiologist (physician) and Advanced Practice Providers (APPs -  Physician Assistants and Nurse Practitioners) who all work together to provide you with the care you need, when you need it.  . You will need a follow up appointment in 12 months .   Please call our office 2 months in advance to schedule this appointment.    . Providers on your designated Care Team:   . Murray Hodgkins, NP . Christell Faith, PA-C . Marrianne Mood, PA-C  Any Other Special Instructions Will Be Listed Below (If Applicable).  For educational health videos Log in to : [email or web address] Or : [email or web address], password : [email or web address]

## 2018-12-23 ENCOUNTER — Emergency Department (HOSPITAL_COMMUNITY): Payer: Medicare Other

## 2018-12-23 ENCOUNTER — Encounter (HOSPITAL_COMMUNITY): Payer: Self-pay | Admitting: Emergency Medicine

## 2018-12-23 ENCOUNTER — Emergency Department (HOSPITAL_COMMUNITY)
Admission: EM | Admit: 2018-12-23 | Discharge: 2018-12-23 | Disposition: A | Discharge status: Home / Self Care | Payer: Medicare Other | Attending: Emergency Medicine | Admitting: Emergency Medicine

## 2018-12-23 ENCOUNTER — Other Ambulatory Visit: Payer: Self-pay

## 2018-12-23 DIAGNOSIS — K529 Noninfective gastroenteritis and colitis, unspecified: Secondary | ICD-10-CM | POA: Insufficient documentation

## 2018-12-23 DIAGNOSIS — R112 Nausea with vomiting, unspecified: Secondary | ICD-10-CM | POA: Diagnosis not present

## 2018-12-23 DIAGNOSIS — I5032 Chronic diastolic (congestive) heart failure: Secondary | ICD-10-CM | POA: Diagnosis not present

## 2018-12-23 DIAGNOSIS — E038 Other specified hypothyroidism: Secondary | ICD-10-CM | POA: Insufficient documentation

## 2018-12-23 DIAGNOSIS — I252 Old myocardial infarction: Secondary | ICD-10-CM | POA: Insufficient documentation

## 2018-12-23 DIAGNOSIS — E1122 Type 2 diabetes mellitus with diabetic chronic kidney disease: Secondary | ICD-10-CM | POA: Diagnosis not present

## 2018-12-23 DIAGNOSIS — I2510 Atherosclerotic heart disease of native coronary artery without angina pectoris: Secondary | ICD-10-CM | POA: Insufficient documentation

## 2018-12-23 DIAGNOSIS — Z8546 Personal history of malignant neoplasm of prostate: Secondary | ICD-10-CM | POA: Diagnosis not present

## 2018-12-23 DIAGNOSIS — R197 Diarrhea, unspecified: Secondary | ICD-10-CM | POA: Diagnosis present

## 2018-12-23 DIAGNOSIS — Z794 Long term (current) use of insulin: Secondary | ICD-10-CM | POA: Insufficient documentation

## 2018-12-23 DIAGNOSIS — I130 Hypertensive heart and chronic kidney disease with heart failure and stage 1 through stage 4 chronic kidney disease, or unspecified chronic kidney disease: Secondary | ICD-10-CM | POA: Insufficient documentation

## 2018-12-23 DIAGNOSIS — N183 Chronic kidney disease, stage 3 (moderate): Secondary | ICD-10-CM | POA: Insufficient documentation

## 2018-12-23 LAB — COMPREHENSIVE METABOLIC PANEL
ALT: 24 U/L (ref 0–44)
AST: 26 U/L (ref 15–41)
Albumin: 3.9 g/dL (ref 3.5–5.0)
Alkaline Phosphatase: 67 U/L (ref 38–126)
Anion gap: 12 (ref 5–15)
BUN: 26 mg/dL — ABNORMAL HIGH (ref 8–23)
CO2: 20 mmol/L — ABNORMAL LOW (ref 22–32)
Calcium: 9 mg/dL (ref 8.9–10.3)
Chloride: 105 mmol/L (ref 98–111)
Creatinine, Ser: 2.01 mg/dL — ABNORMAL HIGH (ref 0.61–1.24)
GFR calc Af Amer: 35 mL/min — ABNORMAL LOW (ref >60)
GFR calc non Af Amer: 30 mL/min — ABNORMAL LOW (ref >60)
Glucose, Bld: 267 mg/dL — ABNORMAL HIGH (ref 70–99)
Potassium: 3.9 mmol/L (ref 3.5–5.1)
Sodium: 137 mmol/L (ref 135–145)
Total Bilirubin: 1.2 mg/dL (ref 0.3–1.2)
Total Protein: 7 g/dL (ref 6.5–8.1)

## 2018-12-23 LAB — URINALYSIS, ROUTINE W REFLEX MICROSCOPIC
Bacteria, UA: NONE SEEN
Bilirubin Urine: NEGATIVE
Glucose, UA: >=500 mg/dL — AB
Hgb urine dipstick: NEGATIVE
Ketones, ur: NEGATIVE mg/dL
Leukocytes,Ua: NEGATIVE
Nitrite: NEGATIVE
Protein, ur: 30 mg/dL — AB
Specific Gravity, Urine: 1.011 (ref 1.005–1.030)
pH: 6 (ref 5.0–8.0)

## 2018-12-23 LAB — CBC WITH DIFFERENTIAL/PLATELET
Abs Immature Granulocytes: 0.02 10*3/uL (ref 0.00–0.07)
Basophils Absolute: 0 10*3/uL (ref 0.0–0.1)
Basophils Relative: 1 %
Eosinophils Absolute: 0.1 10*3/uL (ref 0.0–0.5)
Eosinophils Relative: 2 %
HCT: 42.6 % (ref 39.0–52.0)
Hemoglobin: 13.8 g/dL (ref 13.0–17.0)
Immature Granulocytes: 0 %
Lymphocytes Relative: 16 %
Lymphs Abs: 1.3 10*3/uL (ref 0.7–4.0)
MCH: 29.4 pg (ref 26.0–34.0)
MCHC: 32.4 g/dL (ref 30.0–36.0)
MCV: 90.8 fL (ref 80.0–100.0)
Monocytes Absolute: 0.5 10*3/uL (ref 0.1–1.0)
Monocytes Relative: 6 %
Neutro Abs: 5.9 10*3/uL (ref 1.7–7.7)
Neutrophils Relative %: 75 %
Platelets: 199 10*3/uL (ref 150–400)
RBC: 4.69 MIL/uL (ref 4.22–5.81)
RDW: 14.2 % (ref 11.5–15.5)
WBC: 7.8 10*3/uL (ref 4.0–10.5)
nRBC: 0 % (ref 0.0–0.2)

## 2018-12-23 LAB — LACTIC ACID, PLASMA
Lactic Acid, Venous: 2.7 mmol/L (ref 0.5–1.9)
Lactic Acid, Venous: 2.3 mmol/L (ref 0.5–1.9)
Lactic Acid, Venous: 2.3 mmol/L (ref 0.5–1.9)
Lactic Acid, Venous: 1.2 mmol/L (ref 0.5–1.9)

## 2018-12-23 LAB — BASIC METABOLIC PANEL
Anion gap: 8 (ref 5–15)
BUN: 23 mg/dL (ref 8–23)
CO2: 20 mmol/L — ABNORMAL LOW (ref 22–32)
Calcium: 8.2 mg/dL — ABNORMAL LOW (ref 8.9–10.3)
Chloride: 110 mmol/L (ref 98–111)
Creatinine, Ser: 1.77 mg/dL — ABNORMAL HIGH (ref 0.61–1.24)
GFR calc Af Amer: 41 mL/min — ABNORMAL LOW (ref >60)
GFR calc non Af Amer: 35 mL/min — ABNORMAL LOW (ref >60)
Glucose, Bld: 232 mg/dL — ABNORMAL HIGH (ref 70–99)
Potassium: 4.7 mmol/L (ref 3.5–5.1)
Sodium: 138 mmol/L (ref 135–145)

## 2018-12-23 LAB — TROPONIN I (HIGH SENSITIVITY)
Troponin I (High Sensitivity): 8 ng/L (ref <18)
Troponin I (High Sensitivity): 9 ng/L (ref <18)

## 2018-12-23 LAB — CBG MONITORING, ED: Glucose-Capillary: 244 mg/dL — ABNORMAL HIGH (ref 70–99)

## 2018-12-23 LAB — LIPASE, BLOOD: Lipase: 38 U/L (ref 11–51)

## 2018-12-23 LAB — POC OCCULT BLOOD, ED: Fecal Occult Bld: POSITIVE — AB

## 2018-12-23 MED ORDER — CIPROFLOXACIN HCL 500 MG PO TABS: 500.0000 mg | ORAL_TABLET | Freq: Two times a day (BID) | ORAL | 0 refills | Status: DC

## 2018-12-23 MED ORDER — ONDANSETRON 4 MG PO TBDP: 4.0000 mg | ORAL_TABLET | Freq: Three times a day (TID) | ORAL | 0 refills | Status: DC | PRN

## 2018-12-23 MED ORDER — METRONIDAZOLE 500 MG PO TABS: 500.0000 mg | ORAL_TABLET | Freq: Three times a day (TID) | ORAL | 0 refills | Status: DC

## 2018-12-23 MED ORDER — ONDANSETRON HCL 4 MG PO TABS: 4.0000 mg | ORAL_TABLET | Freq: Four times a day (QID) | ORAL | 0 refills | Status: DC

## 2018-12-23 MED ORDER — SODIUM CHLORIDE 0.9 % IV BOLUS
500.0000 mL | Freq: Once | INTRAVENOUS | Status: AC
  Administered 2018-12-23: 500 mL via INTRAVENOUS

## 2018-12-23 MED ORDER — METRONIDAZOLE IN NACL 5-0.79 MG/ML-% IV SOLN
500.0000 mg | Freq: Once | INTRAVENOUS | Status: AC
  Administered 2018-12-23: 500 mg via INTRAVENOUS
  Filled 2018-12-23: qty 100

## 2018-12-23 MED ORDER — SODIUM CHLORIDE 0.9 % IV BOLUS
500.0000 mL | Freq: Once | INTRAVENOUS | Status: AC
  Administered 2018-12-23: 05:00:00 500 mL via INTRAVENOUS

## 2018-12-23 MED ORDER — CIPROFLOXACIN IN D5W 400 MG/200ML IV SOLN
400.0000 mg | Freq: Once | INTRAVENOUS | Status: AC
  Administered 2018-12-23: 400 mg via INTRAVENOUS
  Filled 2018-12-23: qty 200

## 2018-12-23 MED ORDER — SODIUM CHLORIDE 0.9 % IV BOLUS
500.0000 mL | Freq: Once | INTRAVENOUS | Status: AC
  Administered 2018-12-23: 01:00:00 500 mL via INTRAVENOUS

## 2018-12-23 MED ORDER — ONDANSETRON HCL 4 MG/2ML IJ SOLN
4.0000 mg | Freq: Once | INTRAMUSCULAR | Status: AC
  Administered 2018-12-23: 4 mg via INTRAVENOUS
  Filled 2018-12-23: qty 2

## 2018-12-23 NOTE — Discharge Instructions (Addendum)
Keep yourself hydrated.  Take the nausea medication as prescribed.  Follow-up with your doctor for recheck this week.  Return to the ED if you develop worsening pain, fever, vomiting, any other concerns.

## 2018-12-23 NOTE — ED Provider Notes (Signed)
[facility] EMERGENCY DEPARTMENT Provider Note   CSN: [id number] Arrival date & time: 12/23/18  0004     History   Chief Complaint Chief Complaint  Patient presents with  . Emesis  . Diarrhea    HPI Victor Gould is a 78 y.o. male.   Reminded  Patient with history of CAD, diabetes, acid reflux disease, hypertension, hyperlipidemia presenting with sudden onset nausea, vomiting, abdominal pain and diarrhea.  Symptoms onset tonight several hours after eating.  He states he was doing well prior to this.  He developed sudden onset nausea after eating some chicken noodle soup and red velvet cake.  The emesis was red in color believes this was due to the cake.  It is unknown how many times he vomited.  He has had several episodes of of nonbloody diarrhea as well.  Crampy lower abdominal pain.  Chills and diaphoresis without fever.  No chest pain or shortness of breath.  No pain with urination or blood in the urine.  No sick contacts or recent travel.  History of appendectomy.  Did have EGD last year that showed no ulcers  Discussed with patient's wife.  She states patient was doing well until he suddenly became sick while watching television.  She ate the same food as  him and is not sick herself.   The history is provided by the patient.  Emesis Associated symptoms: abdominal pain, chills and diarrhea   Associated symptoms: no arthralgias, no cough, no fever, no headaches and no myalgias   Diarrhea Associated symptoms: abdominal pain, chills and vomiting   Associated symptoms: no arthralgias, no fever, no headaches and no myalgias     Past Medical History:  Diagnosis Date  . ASCVD (arteriosclerotic cardiovascular disease)   . Cancer [facility])    prostate  . Chronic kidney disease   . Colon polyps   . Constipation   . Coronary artery disease   . Diabetes mellitus   . Family history of adverse reaction to anesthesia    children - PONV  . GERD  (gastroesophageal reflux disease)   . Hemorrhoids   . Hyperlipidemia   . Hypertension    PCP [name] at [place]  . Hypothyroidism   . MI (myocardial infarction) ([place])   . PONV (postoperative nausea and vomiting)   . Thyroid disease   . Vertigo    positional    Patient Active Problem List   Diagnosis Date Noted  . Mixed hyperlipidemia 08/14/2017  . Musculoskeletal chest pain 12/24/2016  . Coronary artery disease of native artery of native heart with stable angina pectoris ([place]) 12/23/2016  . Chronic diastolic CHF (congestive heart failure) ([place]) 12/23/2016  . Essential hypertension 09/20/2016  . Atrial fibrillation ([place]) 09/20/2016  . Chronic renal disease, stage 3, moderately decreased glomerular filtration rate between 30-59 mL/min/1.73 square meter ([place]) 09/20/2016  . Type 2 diabetes mellitus without complications ([place]) 34/74/2595  . ASCVD (arteriosclerotic cardiovascular disease) 09/10/2016  . S/P CABG x 5   . NSTEMI (non-ST elevated myocardial infarction) ([place]) 08/25/2016  . History of prostate cancer 05/08/2016  . Low serum vitamin D 11/04/2015  . Type 1 diabetes mellitus on insulin therapy ([place]) 08/15/2015  . Type 1 DM with CKD stage 3 and hypertension ([place]) 08/15/2015  . Acquired hypothyroidism 07/29/2014  . Incontinence of urine 09/29/2013    Past Surgical History:  Procedure Laterality Date  . APPENDECTOMY    . CATARACT EXTRACTION W/PHACO Right 10/27/2018   Procedure: CATARACT EXTRACTION  PHACO AND INTRAOCULAR LENS PLACEMENT (IOC) RIGHT, DIABETIC;  Surgeon: [name], MD;  Location: [facility];  Service: Ophthalmology;  Laterality: Right;  diabetic - insulin  . CATARACT EXTRACTION W/PHACO Left 11/25/2018   Procedure: CATARACT EXTRACTION PHACO AND INTRAOCULAR LENS PLACEMENT ([place]) LEFT DIABETIC;  Surgeon: [name], MD;  Location: [facility];  Service: Ophthalmology;  Laterality: Left;  . COLONOSCOPY    . colonoscopy with  polypectomy    . COLONOSCOPY WITH PROPOFOL N/A 12/02/2017   Procedure: COLONOSCOPY WITH PROPOFOL;  Surgeon: [name], MD;  Location: [facility] ENDOSCOPY;  Service: Endoscopy;  Laterality: N/A;  . CORONARY ARTERY BYPASS GRAFT N/A 08/28/2016   Procedure: CORONARY ARTERY BYPASS GRAFTING (CABG) x five, using left internal mammary artery and right leg greater saphenous vein harvested endoscopically - -LIMA to LAD, -SVG to OM, -SVG to DIAGONAL, -SEQ SVG to PDA, PLVB ;  Surgeon: [name], MD;  Location: [facility];  Service: Open Heart Surgery;  Laterality: N/A;  . CYSTOSCOPY N/A 09/29/2013   Procedure: CYSTOSCOPY;  Surgeon: [name], MD;  Location: [facility];  Service: Urology;  Laterality: N/A;  . ESOPHAGOGASTRODUODENOSCOPY (EGD) WITH PROPOFOL N/A 12/02/2017   Procedure: ESOPHAGOGASTRODUODENOSCOPY (EGD) WITH PROPOFOL;  Surgeon: [name], MD;  Location: [facility] ENDOSCOPY;  Service: Endoscopy;  Laterality: N/A;  . HERNIA REPAIR     inguinal bilaterally  . LEFT HEART CATH AND CORONARY ANGIOGRAPHY N/A 08/27/2016   Procedure: Left Heart Cath and Coronary Angiography;  Surgeon: [name], MD;  Location: [facility] CV LAB;  Service: Cardiovascular;  Laterality: N/A;  . ROBOT ASSISTED LAPAROSCOPIC RADICAL PROSTATECTOMY  06/25/2011   Procedure: ROBOTIC ASSISTED LAPAROSCOPIC RADICAL PROSTATECTOMY LEVEL 2;  Surgeon: [name], MD;  Location: [facility];  Service: Urology;  Laterality: N/A;  . TEE WITHOUT CARDIOVERSION N/A 08/28/2016   Procedure: TRANSESOPHAGEAL ECHOCARDIOGRAM (TEE);  Surgeon: [name], MD;  Location: [place];  Service: Open Heart Surgery;  Laterality: N/A;  . UPPER GASTROINTESTINAL ENDOSCOPY    . URETHRAL SLING N/A 09/29/2013   Procedure: MALE SLING;  Surgeon: [name], MD;  Location: [facility];  Service: Urology;  Laterality: N/A;        Home Medications    Prior to Admission medications   Medication Sig Start Date End Date Taking? Authorizing Provider   acetaminophen (TYLENOL) 500 MG tablet Take 1,000 mg by mouth daily as needed for headache (pain).    [provider]  amLODipine (NORVASC) 5 MG tablet Take 1 tablet (5 mg total) by mouth daily. 12/08/18 03/08/19  Minna Merritts, MD  aspirin EC 81 MG tablet Take 81 mg by mouth daily.    [provider]  atorvastatin (LIPITOR) 40 MG tablet Take 1 tablet (40 mg total) by mouth daily at 6 PM. 09/03/16   Nani Skillern, PA-C  b complex vitamins tablet Take 1 tablet by mouth daily after breakfast.    [provider]  cholecalciferol (VITAMIN D) 1000 units tablet Take 1,000 Units by mouth daily after breakfast.    [provider]  clopidogrel (PLAVIX) 75 MG tablet Take 75 mg by mouth daily.    [provider]  hydrocortisone cream 1 % Apply 1 application topically daily as needed for itching.    [provider]  insulin glargine (LANTUS) 100 unit/mL SOPN Inject 17 Units into the skin daily before breakfast.    [provider]  insulin lispro (HUMALOG KWIKPEN) 100 UNIT/ML KiwkPen Inject 7-10 Units  into the skin See admin instructions. Inject 7 units subcutaneously before breakfast, lunch and dinner - plus adjustment for CBG 140-180 add 1 unit, 180-220 add 3 units, >220 add 3 units    [provider]  levothyroxine (SYNTHROID, LEVOTHROID) 75 MCG tablet Take 75 mcg by mouth daily before breakfast.    [provider]  losartan (COZAAR) 25 MG tablet Take 1 tablet (25 mg total) by mouth daily after breakfast. 09/03/16   Nani Skillern, PA-C  Melatonin 3 MG TABS Take by mouth at bedtime as needed.    [provider]  metoprolol succinate (TOPROL XL) 25 MG 24 hr tablet Take 1 tablet (25 mg total) by mouth daily. 08/14/17   Minna Merritts, MD  Multiple Vitamin (MULTIVITAMIN WITH MINERALS) TABS tablet Take 1 tablet by mouth daily after breakfast.    [provider]  omeprazole (PRILOSEC) 20 MG capsule Take  20 mg by mouth daily before breakfast.     [provider]  Polyethyl Glycol-Propyl Glycol (SYSTANE FREE OP) Apply to eye as needed.    [provider]  Probiotic Product (PROBIOTIC DAILY PO) Take by mouth.    [provider]    Family History Family History  Problem Relation Age of Onset  . Heart attack Mother 43  . Heart attack Brother     Social History Social History   Tobacco Use  . Smoking status: Never Smoker  . Smokeless tobacco: Never Used  Substance Use Topics  . Alcohol use: No  . Drug use: No     Allergies   Naproxen sodium, Other, Oxycodone hcl, Sulfa antibiotics, and Tramadol   Review of Systems Review of Systems  Constitutional: Positive for activity change, appetite change and chills. Negative for fatigue and fever.  HENT: Negative for congestion and rhinorrhea.   Respiratory: Negative for cough, chest tightness and shortness of breath.   Gastrointestinal: Positive for abdominal pain, diarrhea and vomiting.  Genitourinary: Negative for dysuria and hematuria.  Musculoskeletal: Negative for arthralgias and myalgias.  Skin: Negative for rash.  Neurological: Positive for weakness. Negative for dizziness and headaches.   all other systems are negative except as noted in the HPI and PMH.     Physical Exam Updated Vital Signs BP (!) 173/83 (BP Location: Right Arm)   Pulse 81   Temp (!) 97.5 F (36.4 C) (Oral)   Resp 20   Ht 5\' 9"  (1.753 m)   Wt 79.4 kg   SpO2 100%   BMI 25.84 kg/m   Physical Exam Vitals signs and nursing note reviewed.  Constitutional:      General: He is not in acute distress.    Appearance: He is well-developed. He is ill-appearing.     Comments:  Actively vomiting pink emesis  HENT:     Head: Normocephalic and atraumatic.     Mouth/Throat:     Pharynx: No oropharyngeal exudate.  Eyes:     Conjunctiva/sclera: Conjunctivae normal.     Pupils: Pupils are equal, round, and reactive to light.   Neck:     Musculoskeletal: Normal range of motion and neck supple.     Comments: No meningismus. Cardiovascular:     Rate and Rhythm: Normal rate and regular rhythm.     Heart sounds: Normal heart sounds. No murmur.  Pulmonary:     Effort: Pulmonary effort is normal. No respiratory distress.     Breath sounds: Normal breath sounds.  Abdominal:     Palpations: Abdomen is soft.  Tenderness: There is abdominal tenderness. There is guarding. There is no rebound.     Comments: Lower abdominal tenderness with guarding bilaterally  Genitourinary:    Comments: Brown stool, no gross blood Musculoskeletal: Normal range of motion.        General: No tenderness.  Skin:    General: Skin is warm.     Capillary Refill: Capillary refill takes less than 2 seconds.  Neurological:     General: No focal deficit present.     Mental Status: He is alert and oriented to person, place, and time. Mental status is at baseline.     Cranial Nerves: No cranial nerve deficit.     Motor: No abnormal muscle tone.     Coordination: Coordination normal.     Comments: No ataxia on finger to nose bilaterally. No pronator drift. 5/5 strength throughout. CN 2-12 intact.Equal grip strength. Sensation intact.   Psychiatric:        Behavior: Behavior normal.      ED Treatments / Results  Labs (all labs ordered are listed, but only abnormal results are displayed) Labs Reviewed  COMPREHENSIVE METABOLIC PANEL - Abnormal; Notable for the following components:      Result Value   CO2 20 (*)    Glucose, Bld 267 (*)    BUN 26 (*)    Creatinine, Ser 2.01 (*)    GFR calc non Af Amer 30 (*)    GFR calc Af Amer 35 (*)    All other components within normal limits  URINALYSIS, ROUTINE W REFLEX MICROSCOPIC - Abnormal; Notable for the following components:   Glucose, UA >=500 (*)    Protein, ur 30 (*)    All other components within normal limits  LACTIC ACID, PLASMA - Abnormal; Notable for the following components:    Lactic Acid, Venous 2.7 (*)    All other components within normal limits  LACTIC ACID, PLASMA - Abnormal; Notable for the following components:   Lactic Acid, Venous 2.3 (*)    All other components within normal limits  POC OCCULT BLOOD, ED - Abnormal; Notable for the following components:   Fecal Occult Bld POSITIVE (*)    All other components within normal limits  CBC WITH DIFFERENTIAL/PLATELET  LIPASE, BLOOD  TROPONIN I (HIGH SENSITIVITY)  TROPONIN I (HIGH SENSITIVITY)    EKG EKG Interpretation  Date/Time:  Tuesday December 23 2018 00:10:39 EDT Ventricular Rate:  85 PR Interval:    QRS Duration: 103 QT Interval:  425 QTC Calculation: 506 R Axis:   3 Text Interpretation:  Sinus rhythm Prolonged PR interval Prolonged QT interval No significant change was found Confirmed by Ezequiel Essex [phone]) on 12/23/2018 12:14:08 AM   Radiology Ct Abdomen Pelvis Wo Contrast  Result Date: 12/23/2018 CLINICAL DATA:  Abdominal pain with gastroenteritis or colitis suspected EXAM: CT ABDOMEN AND PELVIS WITHOUT CONTRAST TECHNIQUE: Multidetector CT imaging of the abdomen and pelvis was performed following the standard protocol without IV contrast. COMPARISON:  08/14/2006 abdominal CT report FINDINGS: Lower chest: Large sliding hiatal hernia. Right coronary atherosclerotic calcification. Hepatobiliary: No focal liver abnormality.No evidence of biliary obstruction or stone. Pancreas: Unremarkable. Spleen: Unremarkable. Adrenals/Urinary Tract: Negative adrenals. No hydronephrosis or stone. Symmetric perinephric stranding which is nonspecific. Unremarkable bladder. Stomach/Bowel: Mild fat stranding around the rectum without convincing wall thickening. No appendicitis. Vascular/Lymphatic: No acute vascular abnormality. Atherosclerotic calcification of the aorta and iliacs. No mass or adenopathy. Reproductive:Prostatectomy. Other: Probable right inguinal hernia repair. Mild fatty enlargement of the left  inguinal  canal. Musculoskeletal: No acute abnormalities. IMPRESSION: 1. Possible mild proctitis. 2. Otherwise no acute finding. 3. Large hiatal hernia. Electronically Signed   By: Monte Fantasia M.D.   On: 12/23/2018 04:11    Procedures Procedures (including critical care time)  Medications Ordered in ED Medications  ondansetron (ZOFRAN) injection 4 mg (has no administration in time range)  sodium chloride 0.9 % bolus 500 mL (has no administration in time range)     Initial Impression / Assessment and Plan / ED Course  I have reviewed the triage vital signs and the nursing notes.  Pertinent labs & imaging results that were available during my care of the patient were reviewed by me and considered in my medical decision making (see chart for details).       Acute onset of lower abdominal pain with nausea, vomiting, and diarrhea.  No fever.  Patient given judicious IV fluids.  He is given symptom control.  No chest pain.  EKG is unchanged.  Lactate elevated 2.7.  Creatinine slightly worse than baseline. Hyperglycemia normal anion gap. CT scan obtained to further evaluate his vomiting with abdominal pain and diarrhea.  EGD and colonoscopy from August 2019 reviewed.  Patient did have gastric polyps but normal-appearing duodenum gastric antrum.  Did have colon polyp removed on colonoscopy.  CT scan is reassuring with possibly mild proctitis but no other acute findings. cipro and flagyl given.   Lactate is down trended to 2.3. No further episodes of vomiting diarrhea throughout ED course.  Patient feels improved and is tolerating p.o.  Is able to ambulate.  Troponin negative x2. His abdomen is soft and nontender.  Continue IV and p.o. hydration.  Will recheck lactate and chemistry after additional IV fluids.  Patient wishes to go home does not want to be admitted to the hospital.  Care will be transferred to Dr. Wilson Singer at shift change.  Anticipate discharge home with lactate is  downtrending and patient continues to improve.    Final Clinical Impressions(s) / ED Diagnoses   Final diagnoses:  Gastroenteritis    ED Discharge Orders    None       Cristin Szatkowski, Annie Main, MD 12/23/18 [phone]

## 2018-12-23 NOTE — ED Triage Notes (Addendum)
Pt to ED via [facility] with c/o sudden onset of nausea, vomiting and diarrhea.  EMS reports on there arrival pt was diaphoretic and pale  EMS gave pt Zofran 4mg  IV PTA

## 2018-12-23 NOTE — ED Notes (Signed)
Pt ambulating well in hallway with no assistance. Pt tolerating water well w/ no reports of nausea.

## 2018-12-23 NOTE — ED Notes (Signed)
Nurse is getting the blood work.

## 2018-12-23 NOTE — ED Notes (Signed)
[place] pts wife wants a pt update

## 2019-05-28 ENCOUNTER — Emergency Department (HOSPITAL_COMMUNITY)
Admission: EM | Admit: 2019-05-28 | Discharge: 2019-05-28 | Disposition: A | Discharge status: Home / Self Care | Payer: Medicare Other | Attending: Emergency Medicine | Admitting: Emergency Medicine

## 2019-05-28 ENCOUNTER — Encounter (HOSPITAL_COMMUNITY): Payer: Self-pay

## 2019-05-28 ENCOUNTER — Other Ambulatory Visit: Payer: Self-pay

## 2019-05-28 ENCOUNTER — Emergency Department (HOSPITAL_COMMUNITY): Payer: Medicare Other

## 2019-05-28 DIAGNOSIS — Z794 Long term (current) use of insulin: Secondary | ICD-10-CM | POA: Diagnosis not present

## 2019-05-28 DIAGNOSIS — I252 Old myocardial infarction: Secondary | ICD-10-CM | POA: Insufficient documentation

## 2019-05-28 DIAGNOSIS — Z20822 Contact with and (suspected) exposure to covid-19: Secondary | ICD-10-CM | POA: Insufficient documentation

## 2019-05-28 DIAGNOSIS — Z79899 Other long term (current) drug therapy: Secondary | ICD-10-CM | POA: Diagnosis not present

## 2019-05-28 DIAGNOSIS — Z951 Presence of aortocoronary bypass graft: Secondary | ICD-10-CM | POA: Insufficient documentation

## 2019-05-28 DIAGNOSIS — I130 Hypertensive heart and chronic kidney disease with heart failure and stage 1 through stage 4 chronic kidney disease, or unspecified chronic kidney disease: Secondary | ICD-10-CM | POA: Insufficient documentation

## 2019-05-28 DIAGNOSIS — R197 Diarrhea, unspecified: Secondary | ICD-10-CM

## 2019-05-28 DIAGNOSIS — E039 Hypothyroidism, unspecified: Secondary | ICD-10-CM | POA: Insufficient documentation

## 2019-05-28 DIAGNOSIS — R110 Nausea: Secondary | ICD-10-CM

## 2019-05-28 DIAGNOSIS — Z7982 Long term (current) use of aspirin: Secondary | ICD-10-CM | POA: Insufficient documentation

## 2019-05-28 DIAGNOSIS — I5032 Chronic diastolic (congestive) heart failure: Secondary | ICD-10-CM | POA: Diagnosis not present

## 2019-05-28 DIAGNOSIS — T68XXXA Hypothermia, initial encounter: Secondary | ICD-10-CM

## 2019-05-28 DIAGNOSIS — Z8546 Personal history of malignant neoplasm of prostate: Secondary | ICD-10-CM | POA: Insufficient documentation

## 2019-05-28 DIAGNOSIS — R61 Generalized hyperhidrosis: Secondary | ICD-10-CM | POA: Insufficient documentation

## 2019-05-28 DIAGNOSIS — E1022 Type 1 diabetes mellitus with diabetic chronic kidney disease: Secondary | ICD-10-CM | POA: Insufficient documentation

## 2019-05-28 DIAGNOSIS — N183 Chronic kidney disease, stage 3 unspecified: Secondary | ICD-10-CM | POA: Insufficient documentation

## 2019-05-28 DIAGNOSIS — R55 Syncope and collapse: Secondary | ICD-10-CM

## 2019-05-28 LAB — BASIC METABOLIC PANEL
Anion gap: 17 — ABNORMAL HIGH (ref 5–15)
Anion gap: 7 (ref 5–15)
BUN: 27 mg/dL — ABNORMAL HIGH (ref 8–23)
BUN: 28 mg/dL — ABNORMAL HIGH (ref 8–23)
CO2: 18 mmol/L — ABNORMAL LOW (ref 22–32)
CO2: 19 mmol/L — ABNORMAL LOW (ref 22–32)
Calcium: 9.3 mg/dL (ref 8.9–10.3)
Calcium: 8.2 mg/dL — ABNORMAL LOW (ref 8.9–10.3)
Chloride: 103 mmol/L (ref 98–111)
Chloride: 110 mmol/L (ref 98–111)
Creatinine, Ser: 2.19 mg/dL — ABNORMAL HIGH (ref 0.61–1.24)
Creatinine, Ser: 2.17 mg/dL — ABNORMAL HIGH (ref 0.61–1.24)
GFR calc Af Amer: 32 mL/min — ABNORMAL LOW (ref >60)
GFR calc Af Amer: 32 mL/min — ABNORMAL LOW (ref >60)
GFR calc non Af Amer: 27 mL/min — ABNORMAL LOW (ref >60)
GFR calc non Af Amer: 28 mL/min — ABNORMAL LOW (ref >60)
Glucose, Bld: 215 mg/dL — ABNORMAL HIGH (ref 70–99)
Glucose, Bld: 191 mg/dL — ABNORMAL HIGH (ref 70–99)
Potassium: 3.7 mmol/L (ref 3.5–5.1)
Potassium: 5.2 mmol/L — ABNORMAL HIGH (ref 3.5–5.1)
Sodium: 138 mmol/L (ref 135–145)
Sodium: 136 mmol/L (ref 135–145)

## 2019-05-28 LAB — CBC WITH DIFFERENTIAL/PLATELET
Abs Immature Granulocytes: 0.03 10*3/uL (ref 0.00–0.07)
Basophils Absolute: 0.1 10*3/uL (ref 0.0–0.1)
Basophils Relative: 0 %
Eosinophils Absolute: 0.1 10*3/uL (ref 0.0–0.5)
Eosinophils Relative: 1 %
HCT: 43.5 % (ref 39.0–52.0)
Hemoglobin: 14.3 g/dL (ref 13.0–17.0)
Immature Granulocytes: 0 %
Lymphocytes Relative: 13 %
Lymphs Abs: 1.5 10*3/uL (ref 0.7–4.0)
MCH: 28.8 pg (ref 26.0–34.0)
MCHC: 32.9 g/dL (ref 30.0–36.0)
MCV: 87.7 fL (ref 80.0–100.0)
Monocytes Absolute: 0.7 10*3/uL (ref 0.1–1.0)
Monocytes Relative: 6 %
Neutro Abs: 8.8 10*3/uL — ABNORMAL HIGH (ref 1.7–7.7)
Neutrophils Relative %: 80 %
Platelets: 233 10*3/uL (ref 150–400)
RBC: 4.96 MIL/uL (ref 4.22–5.81)
RDW: 13.7 % (ref 11.5–15.5)
WBC: 11.2 10*3/uL — ABNORMAL HIGH (ref 4.0–10.5)
nRBC: 0 % (ref 0.0–0.2)

## 2019-05-28 LAB — URINALYSIS, ROUTINE W REFLEX MICROSCOPIC
Bacteria, UA: NONE SEEN
Bilirubin Urine: NEGATIVE
Glucose, UA: 150 mg/dL — AB
Hgb urine dipstick: NEGATIVE
Ketones, ur: 20 mg/dL — AB
Leukocytes,Ua: NEGATIVE
Nitrite: NEGATIVE
Protein, ur: 100 mg/dL — AB
Specific Gravity, Urine: 1.018 (ref 1.005–1.030)
pH: 5 (ref 5.0–8.0)

## 2019-05-28 LAB — LACTIC ACID, PLASMA
Lactic Acid, Venous: 3 mmol/L (ref 0.5–1.9)
Lactic Acid, Venous: 1 mmol/L (ref 0.5–1.9)

## 2019-05-28 LAB — RESPIRATORY PANEL BY RT PCR (FLU A&B, COVID)
Influenza A by PCR: NEGATIVE
Influenza B by PCR: NEGATIVE
SARS Coronavirus 2 by RT PCR: NEGATIVE

## 2019-05-28 MED ORDER — PROCHLORPERAZINE EDISYLATE 10 MG/2ML IJ SOLN
10.0000 mg | Freq: Once | INTRAMUSCULAR | Status: AC
  Administered 2019-05-28: 10 mg via INTRAVENOUS
  Filled 2019-05-28: qty 2

## 2019-05-28 MED ORDER — SODIUM CHLORIDE 0.9 % IV BOLUS
1000.0000 mL | Freq: Once | INTRAVENOUS | Status: AC
  Administered 2019-05-28: 1000 mL via INTRAVENOUS

## 2019-05-28 MED ORDER — LOPERAMIDE HCL 2 MG PO CAPS
4.0000 mg | ORAL_CAPSULE | Freq: Once | ORAL | Status: AC
  Administered 2019-05-28: 07:00:00 4 mg via ORAL
  Filled 2019-05-28: qty 2

## 2019-05-28 MED ORDER — SODIUM CHLORIDE 0.9 % IV BOLUS
500.0000 mL | Freq: Once | INTRAVENOUS | Status: AC
  Administered 2019-05-28: 500 mL via INTRAVENOUS

## 2019-05-28 NOTE — ED Notes (Signed)
C/o feeling weak. Wife called and update given.

## 2019-05-28 NOTE — ED Triage Notes (Signed)
Pt BIB [facility] from home for NVD that began 0430. Denies pain, fever, chills.  Hx DM, implant for BG LRQ. VSS en route, 4 of zofran given EKG unremarkable

## 2019-05-28 NOTE — ED Notes (Signed)
Wife called and updated on status and plan to admit. Wife states patient got his first covid vaccine 1/16

## 2019-05-28 NOTE — ED Notes (Signed)
Bear hugger  Removed

## 2019-05-28 NOTE — Discharge Instructions (Signed)
Your testing is reassuring -  ER for severe or worsening symptoms,  Drink plenty of fluids -  See your doctor for any ongoing symptoms - and to be evaluated for these recurring episodes.

## 2019-05-28 NOTE — ED Notes (Signed)
Ambulated to bathroom with assist, steady gait ,states he feels a little dizzy while walking . Unable to get urine at this time.

## 2019-05-28 NOTE — ED Provider Notes (Signed)
Patient was accepted at change of shift, he is an 79 year old male who presented with an episode of nausea, several episodes of diarrhea, he was very diaphoretic and clammy when the paramedics arrived due to near syncope.  He states he did not pass out, did not have any chest pain coughing shortness of breath or headache.  He reports that he is feeling entirely better at this time.  He is no longer having any abdominal cramping or diarrhea, his nausea is minimal and he is feeling back to baseline.  On my exam the patient has normal vital signs, he is not tachycardic hypotensive or hypothermic.  He did require a warming blanket because of a slight hypothermia on arrival but is doing better at this time.  He is not having any urinary symptoms but will add a urinalysis as well as a lactic acid on.  The patient is agreeable to this plan, he appears well, has no chest pain, his vital signs are reassuring and I suspect that this was more of a vagal episode related to gastrointestinal distress.  He reports he has had a very similar episode in the past related to GI symptoms.  He is willing to be discharged and follow-up in the outpatient setting with precautions  Change of shift - care signed out to Dr. Rex Kras to follow up the results of the repeat labs - Pt has ongoing normal VS including a normalization in the temperature.   Noemi Chapel, MD 05/28/19 [phone]

## 2019-05-28 NOTE — ED Notes (Signed)
Patient verbalizes understanding of discharge instructions. Opportunity for questioning and answers were provided. Armband removed by staff, pt discharged from ED ambulatory.   

## 2019-05-28 NOTE — ED Provider Notes (Signed)
[facility] EMERGENCY DEPARTMENT Provider Note   CSN: [id number] Arrival date & time: 05/28/19  5176   History Chief Complaint  Patient presents with  . Nausea  . Diarrhea  . Weakness    Victor Gould is a 79 y.o. male.  The history is provided by the patient.  Diarrhea Weakness Associated symptoms: diarrhea   He has history of hypertension, diabetes, hyperlipidemia, chronic kidney disease and was brought in by ambulance after developing nausea and diarrhea.  He was found on the commode very diaphoretic.  He states he has not vomited.  He is not sure how many loose bowel movements he had.  EMS gave ondansetron which has improved his nausea but it is still present.  He denies fever or chills.  He denies any sick contacts.  He denies exposure to COVID-19.  He denies loss of sense of smell or taste.  Past Medical History:  Diagnosis Date  . ASCVD (arteriosclerotic cardiovascular disease)   . Cancer [facility])    prostate  . Chronic kidney disease   . Colon polyps   . Constipation   . Coronary artery disease   . Diabetes mellitus   . Family history of adverse reaction to anesthesia    children - PONV  . GERD (gastroesophageal reflux disease)   . Hemorrhoids   . Hyperlipidemia   . Hypertension    PCP [name] at [place]  . Hypothyroidism   . MI (myocardial infarction) ([place])   . PONV (postoperative nausea and vomiting)   . Thyroid disease   . Vertigo    positional    Patient Active Problem List   Diagnosis Date Noted  . Mixed hyperlipidemia 08/14/2017  . Musculoskeletal chest pain 12/24/2016  . Coronary artery disease of native artery of native heart with stable angina pectoris ([place]) 12/23/2016  . Chronic diastolic CHF (congestive heart failure) ([place]) 12/23/2016  . Essential hypertension 09/20/2016  . Atrial fibrillation ([place]) 09/20/2016  . Chronic renal disease, stage 3, moderately decreased glomerular filtration rate between 30-59  mL/min/1.73 square meter 09/20/2016  . Type 2 diabetes mellitus without complications ([place]) 16/10/3708  . ASCVD (arteriosclerotic cardiovascular disease) 09/10/2016  . S/P CABG x 5   . NSTEMI (non-ST elevated myocardial infarction) ([place]) 08/25/2016  . History of prostate cancer 05/08/2016  . Low serum vitamin D 11/04/2015  . Type 1 diabetes mellitus on insulin therapy ([place]) 08/15/2015  . Type 1 DM with CKD stage 3 and hypertension ([place]) 08/15/2015  . Acquired hypothyroidism 07/29/2014  . Incontinence of urine 09/29/2013    Past Surgical History:  Procedure Laterality Date  . APPENDECTOMY    . CATARACT EXTRACTION W/PHACO Right 10/27/2018   Procedure: CATARACT EXTRACTION PHACO AND INTRAOCULAR LENS PLACEMENT (IOC) RIGHT, DIABETIC;  Surgeon: [name], MD;  Location: [place];  Service: Ophthalmology;  Laterality: Right;  diabetic - insulin  . CATARACT EXTRACTION W/PHACO Left 11/25/2018   Procedure: CATARACT EXTRACTION PHACO AND INTRAOCULAR LENS PLACEMENT ([place]) LEFT DIABETIC;  Surgeon: [name], MD;  Location: [place];  Service: Ophthalmology;  Laterality: Left;  . COLONOSCOPY    . colonoscopy with polypectomy    . COLONOSCOPY WITH PROPOFOL N/A 12/02/2017   Procedure: COLONOSCOPY WITH PROPOFOL;  Surgeon: [name], MD;  Location: [facility] ENDOSCOPY;  Service: Endoscopy;  Laterality: N/A;  . CORONARY ARTERY BYPASS GRAFT N/A 08/28/2016   Procedure: CORONARY ARTERY BYPASS GRAFTING (CABG) x five, using left internal mammary artery and right leg greater saphenous vein harvested  endoscopically - -LIMA to LAD, -SVG to OM, -SVG to DIAGONAL, -SEQ SVG to PDA, PLVB ;  Surgeon: [name], MD;  Location: [place];  Service: Open Heart Surgery;  Laterality: N/A;  . CYSTOSCOPY N/A 09/29/2013   Procedure: CYSTOSCOPY;  Surgeon: [name], MD;  [facility];  Service: Urology;  Laterality: N/A;  . ESOPHAGOGASTRODUODENOSCOPY (EGD) WITH PROPOFOL N/A 12/02/2017    Procedure: ESOPHAGOGASTRODUODENOSCOPY (EGD) WITH PROPOFOL;  Surgeon: [name], MD;  Location: [facility] ENDOSCOPY;  Service: Endoscopy;  Laterality: N/A;  . HERNIA REPAIR     inguinal bilaterally  . LEFT HEART CATH AND CORONARY ANGIOGRAPHY N/A 08/27/2016   Procedure: Left Heart Cath and Coronary Angiography;  Surgeon: [name], MD;  Location: [place] CV LAB;  Service: Cardiovascular;  Laterality: N/A;  . ROBOT ASSISTED LAPAROSCOPIC RADICAL PROSTATECTOMY  06/25/2011   Procedure: ROBOTIC ASSISTED LAPAROSCOPIC RADICAL PROSTATECTOMY LEVEL 2;  Surgeon: [name], MD;  [facility];  Service: Urology;  Laterality: N/A;  . TEE WITHOUT CARDIOVERSION N/A 08/28/2016   Procedure: TRANSESOPHAGEAL ECHOCARDIOGRAM (TEE);  Surgeon: [name], MD;  Location: [place];  Service: Open Heart Surgery;  Laterality: N/A;  . UPPER GASTROINTESTINAL ENDOSCOPY    . URETHRAL SLING N/A 09/29/2013   Procedure: MALE SLING;  Surgeon: [name], MD;  [facility];  Service: Urology;  Laterality: N/A;       Family History  Problem Relation Age of Onset  . Heart attack Mother 40  . Heart attack Brother     Social History   Tobacco Use  . Smoking status: Never Smoker  . Smokeless tobacco: Never Used  Substance Use Topics  . Alcohol use: No  . Drug use: No    Home Medications Prior to Admission medications   Medication Sig Start Date End Date Taking? Authorizing Provider  acetaminophen (TYLENOL) 500 MG tablet Take 500-1,000 mg by mouth daily as needed for headache (pain).     [provider]  amLODipine (NORVASC) 10 MG tablet Take 10 mg by mouth daily.    [provider]  amLODipine (NORVASC) 5 MG tablet Take 1 tablet (5 mg total) by mouth daily. 12/08/18 03/08/19  Minna Merritts, MD  aspirin EC 81 MG tablet Take 81 mg by mouth daily.    [provider]  atorvastatin (LIPITOR) 40 MG tablet Take 1 tablet (40 mg total) by mouth daily at 6 PM. 09/03/16    Nani Skillern, PA-C  b complex vitamins tablet Take 1 tablet by mouth daily after breakfast.    [provider]  cholecalciferol (VITAMIN D) 1000 units tablet Take 1,000 Units by mouth daily after breakfast.    [provider]  ciprofloxacin (CIPRO) 500 MG tablet Take 1 tablet (500 mg total) by mouth every 12 (twelve) hours. 12/23/18   Virgel Manifold, MD  clopidogrel (PLAVIX) 75 MG tablet Take 75 mg by mouth daily.    [provider]  hydrocortisone cream 1 % Apply 1 application topically daily as needed for itching.    [provider]  insulin glargine (LANTUS) 100 unit/mL SOPN Inject 5-12 Units into the skin See admin instructions. Inject 12 units 10 minutes before breakfast, and 5 units before bed    [provider]  insulin lispro (HUMALOG KWIKPEN) 100 UNIT/ML KiwkPen Inject 7-10 Units into the skin See admin instructions. Inject 7 units subcutaneously before breakfast, lunch and dinner - plus adjustment for CBG 140-180 add 1 unit, 180-220 add  3 units, >220 add 3 units    [provider]  levothyroxine (SYNTHROID, LEVOTHROID) 75 MCG tablet Take 75 mcg by mouth daily before breakfast.    [provider]  losartan (COZAAR) 25 MG tablet Take 1 tablet (25 mg total) by mouth daily after breakfast. 09/03/16   Nani Skillern, PA-C  metoprolol succinate (TOPROL XL) 25 MG 24 hr tablet Take 1 tablet (25 mg total) by mouth daily. 08/14/17   Minna Merritts, MD  metroNIDAZOLE (FLAGYL) 500 MG tablet Take 1 tablet (500 mg total) by mouth 3 (three) times daily. 12/23/18   Virgel Manifold, MD  Multiple Vitamin (MULTIVITAMIN WITH MINERALS) TABS tablet Take 1 tablet by mouth daily after breakfast.    [provider]  omeprazole (PRILOSEC) 20 MG capsule Take 20 mg by mouth daily before breakfast.     [provider]  ondansetron (ZOFRAN ODT) 4 MG disintegrating tablet Take 1 tablet (4 mg total) by mouth every 8 (eight) hours  as needed for nausea or vomiting. 12/23/18   Rancour, Annie Main, MD  ondansetron (ZOFRAN) 4 MG tablet Take 1 tablet (4 mg total) by mouth every 6 (six) hours. 12/23/18   Virgel Manifold, MD  Polyethyl Glycol-Propyl Glycol (SYSTANE FREE OP) Apply 1 drop to eye daily as needed (dry eyes).     [provider]    Allergies    Naproxen sodium, Other, Oxycodone hcl, Sulfa antibiotics, and Tramadol  Review of Systems   Review of Systems  Gastrointestinal: Positive for diarrhea.  Neurological: Positive for weakness.  All other systems reviewed and are negative.   Physical Exam Updated Vital Signs BP (!) 160/77 (BP Location: Right Arm)   Pulse 75   Temp (!) 93.3 F (34.1 C) (Rectal)   Resp (!) 21   Ht 5\' 10"  (1.778 m)   Wt 81.6 kg   SpO2 99%   BMI 25.83 kg/m   Physical Exam Vitals and nursing note reviewed.   79 year old male, resting comfortably and in no acute distress. Vital signs are significant for low core temperature and elevated systolic blood pressure and borderline elevated respiratory rate. Oxygen saturation is 99%, which is normal. Head is normocephalic and atraumatic. PERRLA, EOMI. Oropharynx is clear. Neck is nontender and supple without adenopathy or JVD. Back is nontender and there is no CVA tenderness. Lungs are clear without rales, wheezes, or rhonchi. Chest is nontender. Heart has regular rate and rhythm without murmur. Abdomen is soft, flat, nontender without masses or hepatosplenomegaly and peristalsis is hypoactive. Extremities have no cyanosis or edema, full range of motion is present. Skin is warm and mildly diaphoretic without rash. Neurologic: Mental status is normal, cranial nerves are intact, there are no motor or sensory deficits.  ED Results / Procedures / Treatments   Labs (all labs ordered are listed, but only abnormal results are displayed) Labs Reviewed  CBC WITH DIFFERENTIAL/PLATELET  BASIC METABOLIC PANEL    EKG EKG Interpretation   Date/Time:  Thursday May 28 2019 06:42:18 EST Ventricular Rate:  75 PR Interval:    QRS Duration: 103 QT Interval:  437 QTC Calculation: 489 R Axis:   14 Text Interpretation: Sinus rhythm Low voltage, precordial leads Borderline prolonged QT interval When compared with ECG of 12/23/2018, QT has shortened Confirmed by Delora Fuel ([id number]) on 05/28/2019 6:56:16 AM  Procedures Procedures  CRITICAL CARE Performed by: Delora Fuel Total critical care time: 35 minutes Critical care time was exclusive of separately billable procedures and treating other patients.  Critical care was necessary to treat or prevent imminent or life-threatening deterioration. Critical care was time spent personally by me on the following activities: development of treatment plan with patient and/or surrogate as well as nursing, discussions with consultants, evaluation of patient's response to treatment, examination of patient, obtaining history from patient or surrogate, ordering and performing treatments and interventions, ordering and review of laboratory studies, ordering and review of radiographic studies, pulse oximetry and re-evaluation of patient's condition.  Medications Ordered in ED Medications  sodium chloride 0.9 % bolus 500 mL (has no administration in time range)  prochlorperazine (COMPAZINE) injection 10 mg (has no administration in time range)  loperamide (IMODIUM) capsule 4 mg (has no administration in time range)    ED Course  I have reviewed the triage vital signs and the nursing notes.  Pertinent lab results that were available during my care of the patient were reviewed by me and considered in my medical decision making (see chart for details).  MDM Rules/Calculators/A&P Nausea and diarrhea which seems most consistent with viral gastroenteritis.  Because of diaphoresis, will check ECG.  He is found to be hypothermic and is started on external warmer.  He does not appear toxic, and I suspect  hypothermia is secondary to combination of diarrhea and external cooling from his sweating.  He will be given IV fluids, oral loperamide, IV prochlorperazine.  Old records are reviewed, and he does have a prior ED visit for gastroenteritis.  Case is signed out to Dr. Sabra Heck.  MICHAELJOHN BISS was evaluated in Emergency Department on 05/28/2019 for the symptoms described in the history of present illness. He was evaluated in the context of the global COVID-19 pandemic, which necessitated consideration that the patient might be at risk for infection with the SARS-CoV-2 virus that causes COVID-19. Institutional protocols and algorithms that pertain to the evaluation of patients at risk for COVID-19 are in a state of rapid change based on information released by regulatory bodies including the CDC and federal and state organizations. These policies and algorithms were followed during the patient's care in the ED.  Final Clinical Impression(s) / ED Diagnoses Final diagnoses:  Diarrhea of presumed infectious origin  Nausea  Hypothermia, initial encounter    Rx / DC Orders ED Discharge Orders    None       Delora Fuel, MD 40/97/35 [phone]

## 2019-05-28 NOTE — ED Provider Notes (Signed)
I received this patient in signout from Dr. Sabra Heck.  At time of signout, patient had received hydration and we were awaiting repeat lab work to ensure improvement.  Repeat BMP shows creatinine 2.17 down from 2.19 but more importantly his anion gap has improved from 17 to 7.  His lactate also went from 3 to 1.  Potassium came back at 5.2 but I suspect hemolysis as his initial potassium level was normal.  He is alert, well-appearing, and comfortable on reassessment.  Vital signs are normal.  He already has follow-up arranged with PCP in the upcoming week and I have discussed importance of recheck of kidney function.  I have reviewed return precautions and he voiced understanding.   Victor Gould, Wenda Overland, MD 05/28/19 [phone]

## 2019-09-15 ENCOUNTER — Other Ambulatory Visit: Payer: Self-pay

## 2019-09-15 ENCOUNTER — Encounter: Payer: Self-pay | Admitting: Gastroenterology

## 2019-09-15 ENCOUNTER — Ambulatory Visit (INDEPENDENT_AMBULATORY_CARE_PROVIDER_SITE_OTHER): Payer: Medicare Other | Admitting: Gastroenterology

## 2019-09-15 VITALS — BP 156/70 | HR 65 | Ht 69.0 in | Wt 178.6 lb

## 2019-09-15 DIAGNOSIS — R112 Nausea with vomiting, unspecified: Secondary | ICD-10-CM

## 2019-09-15 DIAGNOSIS — R197 Diarrhea, unspecified: Secondary | ICD-10-CM | POA: Diagnosis not present

## 2019-09-16 NOTE — Progress Notes (Signed)
Gastroenterology Consultation  Referring Provider:     Rusty Aus, MD Primary Care Physician:  Rusty Aus, MD Primary Gastroenterologist:  Dr. Allen Norris     Reason for Consultation:     Nausea vomiting and diarrhea        HPI:   Victor Gould is a 79 y.o. y/o male referred for consultation & management of nausea vomiting and diarrhea by Dr. Sabra Heck, Christean Grief, MD.  This patient comes in with a report of 2 episodes of GI symptoms with the first 1 being nausea with vomiting and diarrhea.  At that time the patient states he was too weak to eat get off the toilet bowl and was feeling lightheaded.  The patient went to the emergency room and the symptoms had subsided.  The patient then reports a few months later he had another episode with only diarrhea and no nausea and was feeling weak and lightheaded.  The patient at that time did not want to go to the emergency room.  During the first episode he was told in the emergency department that he likely had a viral infection.  He has not had another episode of this since then. The patient is concerned because he does not know what caused this.  The patient does report that he typically has stools on the softer side and approximately once a week has diarrhea.  It is to any particular foods but he does report that the has cheese daily and ice cream on a regular basis.  The patient denies any unexplained weight loss fevers chills nausea or vomiting when he is not having the symptoms.  He had been seen in the past by Dr. Vira Agar and his last colonoscopy and upper endoscopy was in 2019.  Past Medical History:  Diagnosis Date  . ASCVD (arteriosclerotic cardiovascular disease)   . Cancer [facility])    prostate  . Chronic kidney disease   . Colon polyps   . Constipation   . Coronary artery disease   . Diabetes mellitus   . Family history of adverse reaction to anesthesia    children - PONV  . GERD (gastroesophageal reflux disease)   . Hemorrhoids   .  Hyperlipidemia   . Hypertension    PCP [name] at [place]  . Hypothyroidism   . MI (myocardial infarction) ([place])   . PONV (postoperative nausea and vomiting)   . Thyroid disease   . Vertigo    positional    Past Surgical History:  Procedure Laterality Date  . APPENDECTOMY    . CATARACT EXTRACTION W/PHACO Right 10/27/2018   Procedure: CATARACT EXTRACTION PHACO AND INTRAOCULAR LENS PLACEMENT (IOC) RIGHT, DIABETIC;  Surgeon: [name], MD;  Location: [place];  Service: Ophthalmology;  Laterality: Right;  diabetic - insulin  . CATARACT EXTRACTION W/PHACO Left 11/25/2018   Procedure: CATARACT EXTRACTION PHACO AND INTRAOCULAR LENS PLACEMENT ([place]) LEFT DIABETIC;  Surgeon: [name], MD;  Location: [place];  Service: Ophthalmology;  Laterality: Left;  . COLONOSCOPY    . colonoscopy with polypectomy    . COLONOSCOPY WITH PROPOFOL N/A 12/02/2017   Procedure: COLONOSCOPY WITH PROPOFOL;  Surgeon: [name], MD;  Location: [facility] ENDOSCOPY;  Service: Endoscopy;  Laterality: N/A;  . CORONARY ARTERY BYPASS GRAFT N/A 08/28/2016   Procedure: CORONARY ARTERY BYPASS GRAFTING (CABG) x five, using left internal mammary artery and right leg greater saphenous vein harvested endoscopically - -LIMA to LAD, -SVG to OM, -SVG to DIAGONAL, -SEQ  SVG to PDA, PLVB ;  Surgeon: [name], MD;  Location: [place];  Service: Open Heart Surgery;  Laterality: N/A;  . CYSTOSCOPY N/A 09/29/2013   Procedure: CYSTOSCOPY;  Surgeon: [name], MD;  [facility];  Service: Urology;  Laterality: N/A;  . ESOPHAGOGASTRODUODENOSCOPY (EGD) WITH PROPOFOL N/A 12/02/2017   Procedure: ESOPHAGOGASTRODUODENOSCOPY (EGD) WITH PROPOFOL;  Surgeon: [name], MD;  [facility] ENDOSCOPY;  Service: Endoscopy;  Laterality: N/A;  . HERNIA REPAIR     inguinal bilaterally  . LEFT HEART CATH AND CORONARY ANGIOGRAPHY N/A 08/27/2016   Procedure: Left Heart Cath and Coronary  Angiography;  Surgeon: [name], MD;  Location: [place] CV LAB;  Service: Cardiovascular;  Laterality: N/A;  . ROBOT ASSISTED LAPAROSCOPIC RADICAL PROSTATECTOMY  06/25/2011   Procedure: ROBOTIC ASSISTED LAPAROSCOPIC RADICAL PROSTATECTOMY LEVEL 2;  Surgeon: [name], MD;  [facility];  Service: Urology;  Laterality: N/A;  . TEE WITHOUT CARDIOVERSION N/A 08/28/2016   Procedure: TRANSESOPHAGEAL ECHOCARDIOGRAM (TEE);  Surgeon: [name], MD;  Location: [place];  Service: Open Heart Surgery;  Laterality: N/A;  . UPPER GASTROINTESTINAL ENDOSCOPY    . URETHRAL SLING N/A 09/29/2013   Procedure: MALE SLING;  Surgeon: [name], MD;  [facility];  Service: Urology;  Laterality: N/A;    Prior to Admission medications   Medication Sig Start Date End Date Taking? Authorizing Provider  acetaminophen (TYLENOL) 500 MG tablet Take 500-1,000 mg by mouth daily as needed for mild pain or headache (pain).    Yes [provider]  aspirin EC 81 MG tablet Take 81 mg by mouth daily.   Yes [provider]  atorvastatin (LIPITOR) 40 MG tablet Take 1 tablet (40 mg total) by mouth daily at 6 PM. 09/03/16  Yes Lars Pinks M, PA-C  b complex vitamins tablet Take 1 tablet by mouth daily after breakfast.   Yes [provider]  cholecalciferol (VITAMIN D) 1000 units tablet Take 1,000 Units by mouth daily after breakfast.   Yes [provider]  clopidogrel (PLAVIX) 75 MG tablet Take 75 mg by mouth daily.   Yes [provider]  hydrocortisone cream 1 % Apply 1 application topically daily as needed for itching.   Yes [provider]  insulin glargine (LANTUS) 100 unit/mL SOPN Inject 6-12 Units into the skin See admin instructions. Inject 12 units 10 minutes before breakfast, and 6 units before bed   Yes [provider]  insulin lispro (HUMALOG KWIKPEN) 100 UNIT/ML KiwkPen Inject 7-10 Units into the skin See admin instructions. Inject  7 units subcutaneously before breakfast, lunch and dinner - plus adjustment for CBG 140-180 add 1 unit, 180-220 add 3 units, >220 add 3 units   Yes [provider]  levothyroxine (SYNTHROID, LEVOTHROID) 75 MCG tablet Take 75 mcg by mouth daily before breakfast.   Yes [provider]  losartan (COZAAR) 100 MG tablet Take 100 mg by mouth daily. 05/12/19  Yes [provider]  metoprolol succinate (TOPROL XL) 25 MG 24 hr tablet Take 1 tablet (25 mg total) by mouth daily. 08/14/17  Yes Gollan, Kathlene November, MD  Multiple Vitamin (MULTIVITAMIN WITH MINERALS) TABS tablet Take 1 tablet by mouth daily after breakfast.   Yes [provider]  omeprazole (PRILOSEC) 20 MG capsule Take 20 mg by mouth daily before breakfast.    Yes [provider]  Polyethyl Glycol-Propyl Glycol (SYSTANE FREE OP) Place 1 drop into both eyes daily as needed (  dry eyes).    Yes [provider]  Probiotic Product (PROBIOTIC ADVANCED PO) Take 1 capsule by mouth daily.   Yes [provider]  amLODipine (NORVASC) 10 MG tablet Take 10 mg by mouth daily.    [provider]  amLODipine (NORVASC) 5 MG tablet Take 1 tablet (5 mg total) by mouth daily. 12/08/18 03/08/19  Minna Merritts, MD  ondansetron (ZOFRAN ODT) 4 MG disintegrating tablet Take 1 tablet (4 mg total) by mouth every 8 (eight) hours as needed for nausea or vomiting. Patient not taking: Reported on 09/15/2019 12/23/18   Ezequiel Essex, MD    Family History  Problem Relation Age of Onset  . Heart attack Mother 7  . Heart attack Brother      Social History   Tobacco Use  . Smoking status: Never Smoker  . Smokeless tobacco: Never Used  Substance Use Topics  . Alcohol use: No  . Drug use: No    Allergies as of 09/15/2019 - Review Complete 09/15/2019  Allergen Reaction Noted  . Naproxen sodium Shortness Of Breath 04/13/2015  . Other Hives 08/14/2017  . Oxycodone hcl Nausea And Vomiting 09/20/2016  .  Sulfa antibiotics Rash and Hives 06/18/2011  . Tramadol Other (See Comments) 07/29/2014    Review of Systems:    All systems reviewed and negative except where noted in HPI.   Physical Exam:  BP (!) 156/70   Pulse 65   Ht 5\' 9"  (1.753 m)   Wt 178 lb 9.6 oz (81 kg)   BMI 26.37 kg/m  No LMP for male patient. General:   Alert,  Well-developed, well-nourished, pleasant and cooperative in NAD Head:  Normocephalic and atraumatic. Eyes:  Sclera clear, no icterus.   Conjunctiva pink. Ears:  Normal auditory acuity. Neck:  Supple; no masses or thyromegaly. Lungs:  Respirations even and unlabored.  Clear throughout to auscultation.   No wheezes, crackles, or rhonchi. No acute distress. Heart:  Regular rate and rhythm; no murmurs, clicks, rubs, or gallops. Abdomen:  Normal bowel sounds.  No bruits.  Soft, non-tender and non-distended without masses, hepatosplenomegaly or hernias noted.  No guarding or rebound tenderness.  Negative Carnett sign.   Rectal:  Deferred.  Pulses:  Normal pulses noted. Extremities:  No clubbing or edema.  No cyanosis. Neurologic:  Alert and oriented x3;  grossly normal neurologically. Skin:  Intact without significant lesions or rashes.  No jaundice. Lymph Nodes:  No significant cervical adenopathy. Psych:  Alert and cooperative. Normal mood and affect.  Imaging Studies: No results found.  Assessment and Plan:   Victor Gould is a 79 y.o. y/o male who comes in today with 2 episodes of which one was nausea vomiting diarrhea and the other one was just diarrhea both accompanied with weakness.  The patient has had no further symptoms except for loose bowel movements on a regular basis usually probably once a week and soft stools regularly.  Patient has been told that he should continue drinking the almond milk he presently drinks but should try and avoid ice cream and cheese for a week to see if that helps his symptoms.  His wife states that when he used to eat  whole wheat bread he had less soft stools and now he is back to eating white bread.  The patient has been told that those episodes he had were likely something systemic since it involved the upper GI tract, lower GI tract and cause dizziness.  He has been told  to try and see if anything he does or eats precipitates any of the symptoms in the future.  The patient will contact me if he has any further concerns.  The patient and his wife have been explained the plan and agree with it.    Lucilla Lame, MD. Marval Regal    Note: This dictation was prepared with Dragon dictation along with smaller phrase technology. Any transcriptional errors that result from this process are unintentional.

## 2019-10-16 ENCOUNTER — Encounter: Payer: Self-pay | Admitting: Unknown Physician Specialty

## 2019-10-16 ENCOUNTER — Encounter: Payer: Self-pay | Admitting: Urology

## 2020-02-22 DIAGNOSIS — M791 Myalgia, unspecified site: Secondary | ICD-10-CM | POA: Insufficient documentation

## 2020-02-22 DIAGNOSIS — T466X5A Adverse effect of antihyperlipidemic and antiarteriosclerotic drugs, initial encounter: Secondary | ICD-10-CM | POA: Insufficient documentation

## 2020-02-23 ENCOUNTER — Ambulatory Visit: Payer: Medicare Other | Admitting: Physician Assistant

## 2020-06-14 ENCOUNTER — Other Ambulatory Visit: Payer: Self-pay | Admitting: Internal Medicine

## 2020-06-14 DIAGNOSIS — N179 Acute kidney failure, unspecified: Secondary | ICD-10-CM

## 2020-06-23 ENCOUNTER — Ambulatory Visit
Admission: RE | Admit: 2020-06-23 | Discharge: 2020-06-23 | Disposition: A | Discharge status: Home / Self Care | Payer: Medicare Other | Source: Ambulatory Visit | Attending: Internal Medicine | Admitting: Internal Medicine

## 2020-06-23 ENCOUNTER — Other Ambulatory Visit: Payer: Self-pay

## 2020-06-23 DIAGNOSIS — N179 Acute kidney failure, unspecified: Secondary | ICD-10-CM | POA: Insufficient documentation

## 2020-09-14 ENCOUNTER — Other Ambulatory Visit
Admission: RE | Admit: 2020-09-14 | Discharge: 2020-09-14 | Disposition: A | Discharge status: Home / Self Care | Payer: Medicare Other | Source: Ambulatory Visit | Attending: Ophthalmology | Admitting: Ophthalmology

## 2020-09-14 DIAGNOSIS — H47012 Ischemic optic neuropathy, left eye: Secondary | ICD-10-CM | POA: Diagnosis present

## 2020-09-14 LAB — CBC WITH DIFFERENTIAL/PLATELET
Abs Immature Granulocytes: 0.03 10*3/uL (ref 0.00–0.07)
Basophils Absolute: 0.1 10*3/uL (ref 0.0–0.1)
Basophils Relative: 1 %
Eosinophils Absolute: 0.2 10*3/uL (ref 0.0–0.5)
Eosinophils Relative: 2 %
HCT: 41.7 % (ref 39.0–52.0)
Hemoglobin: 13.8 g/dL (ref 13.0–17.0)
Immature Granulocytes: 0 %
Lymphocytes Relative: 22 %
Lymphs Abs: 1.7 10*3/uL (ref 0.7–4.0)
MCH: 29.2 pg (ref 26.0–34.0)
MCHC: 33.1 g/dL (ref 30.0–36.0)
MCV: 88.2 fL (ref 80.0–100.0)
Monocytes Absolute: 0.7 10*3/uL (ref 0.1–1.0)
Monocytes Relative: 9 %
Neutro Abs: 5.1 10*3/uL (ref 1.7–7.7)
Neutrophils Relative %: 66 %
Platelets: 266 10*3/uL (ref 150–400)
RBC: 4.73 MIL/uL (ref 4.22–5.81)
RDW: 13.1 % (ref 11.5–15.5)
WBC: 7.7 10*3/uL (ref 4.0–10.5)
nRBC: 0 % (ref 0.0–0.2)

## 2020-09-14 LAB — C-REACTIVE PROTEIN: CRP: 0.9 mg/dL (ref <1.0)

## 2020-09-14 LAB — SEDIMENTATION RATE: Sed Rate: 30 mm/hr — ABNORMAL HIGH (ref 0–20)

## 2021-01-06 ENCOUNTER — Other Ambulatory Visit: Payer: Self-pay | Admitting: Neurology

## 2021-01-06 DIAGNOSIS — G20 Parkinson's disease: Secondary | ICD-10-CM

## 2021-01-18 ENCOUNTER — Other Ambulatory Visit: Payer: Self-pay

## 2021-01-18 ENCOUNTER — Observation Stay
Admission: EM | Admit: 2021-01-18 | Discharge: 2021-01-18 | Disposition: A | Discharge status: Home / Self Care | Payer: Medicare Other | Attending: Internal Medicine | Admitting: Internal Medicine

## 2021-01-18 ENCOUNTER — Encounter: Payer: Self-pay | Admitting: Emergency Medicine

## 2021-01-18 ENCOUNTER — Ambulatory Visit
Admission: RE | Admit: 2021-01-18 | Discharge: 2021-01-18 | Disposition: A | Discharge status: Home / Self Care | Payer: Medicare Other | Source: Ambulatory Visit | Attending: Neurology | Admitting: Neurology

## 2021-01-18 DIAGNOSIS — I5032 Chronic diastolic (congestive) heart failure: Secondary | ICD-10-CM | POA: Diagnosis not present

## 2021-01-18 DIAGNOSIS — G20 Parkinson's disease: Secondary | ICD-10-CM | POA: Insufficient documentation

## 2021-01-18 DIAGNOSIS — I130 Hypertensive heart and chronic kidney disease with heart failure and stage 1 through stage 4 chronic kidney disease, or unspecified chronic kidney disease: Secondary | ICD-10-CM | POA: Diagnosis not present

## 2021-01-18 DIAGNOSIS — Z794 Long term (current) use of insulin: Secondary | ICD-10-CM | POA: Insufficient documentation

## 2021-01-18 DIAGNOSIS — Z7982 Long term (current) use of aspirin: Secondary | ICD-10-CM | POA: Diagnosis not present

## 2021-01-18 DIAGNOSIS — Z8546 Personal history of malignant neoplasm of prostate: Secondary | ICD-10-CM | POA: Insufficient documentation

## 2021-01-18 DIAGNOSIS — Z951 Presence of aortocoronary bypass graft: Secondary | ICD-10-CM | POA: Diagnosis not present

## 2021-01-18 DIAGNOSIS — I2510 Atherosclerotic heart disease of native coronary artery without angina pectoris: Secondary | ICD-10-CM | POA: Insufficient documentation

## 2021-01-18 DIAGNOSIS — I480 Paroxysmal atrial fibrillation: Secondary | ICD-10-CM | POA: Diagnosis not present

## 2021-01-18 DIAGNOSIS — N183 Chronic kidney disease, stage 3 unspecified: Secondary | ICD-10-CM | POA: Diagnosis present

## 2021-01-18 DIAGNOSIS — R531 Weakness: Secondary | ICD-10-CM | POA: Diagnosis present

## 2021-01-18 DIAGNOSIS — I639 Cerebral infarction, unspecified: Principal | ICD-10-CM | POA: Insufficient documentation

## 2021-01-18 DIAGNOSIS — E1122 Type 2 diabetes mellitus with diabetic chronic kidney disease: Secondary | ICD-10-CM | POA: Insufficient documentation

## 2021-01-18 DIAGNOSIS — Z20822 Contact with and (suspected) exposure to covid-19: Secondary | ICD-10-CM | POA: Diagnosis not present

## 2021-01-18 DIAGNOSIS — I4891 Unspecified atrial fibrillation: Secondary | ICD-10-CM | POA: Insufficient documentation

## 2021-01-18 DIAGNOSIS — Z8673 Personal history of transient ischemic attack (TIA), and cerebral infarction without residual deficits: Secondary | ICD-10-CM | POA: Diagnosis present

## 2021-01-18 DIAGNOSIS — E039 Hypothyroidism, unspecified: Secondary | ICD-10-CM | POA: Insufficient documentation

## 2021-01-18 DIAGNOSIS — Z79899 Other long term (current) drug therapy: Secondary | ICD-10-CM | POA: Diagnosis not present

## 2021-01-18 DIAGNOSIS — E782 Mixed hyperlipidemia: Secondary | ICD-10-CM | POA: Diagnosis present

## 2021-01-18 DIAGNOSIS — I25118 Atherosclerotic heart disease of native coronary artery with other forms of angina pectoris: Secondary | ICD-10-CM

## 2021-01-18 DIAGNOSIS — I10 Essential (primary) hypertension: Secondary | ICD-10-CM

## 2021-01-18 DIAGNOSIS — N1832 Chronic kidney disease, stage 3b: Secondary | ICD-10-CM | POA: Insufficient documentation

## 2021-01-18 LAB — URINALYSIS, COMPLETE (UACMP) WITH MICROSCOPIC
Bacteria, UA: NONE SEEN
Bilirubin Urine: NEGATIVE
Glucose, UA: NEGATIVE mg/dL
Hgb urine dipstick: NEGATIVE
Ketones, ur: 5 mg/dL — AB
Leukocytes,Ua: NEGATIVE
Nitrite: NEGATIVE
Protein, ur: >=300 mg/dL — AB
Specific Gravity, Urine: 1.015 (ref 1.005–1.030)
Squamous Epithelial / HPF: NONE SEEN (ref 0–5)
pH: 6 (ref 5.0–8.0)

## 2021-01-18 LAB — CBG MONITORING, ED: Glucose-Capillary: 132 mg/dL — ABNORMAL HIGH (ref 70–99)

## 2021-01-18 LAB — RESP PANEL BY RT-PCR (FLU A&B, COVID) ARPGX2
Influenza A by PCR: NEGATIVE
Influenza B by PCR: NEGATIVE
SARS Coronavirus 2 by RT PCR: NEGATIVE

## 2021-01-18 LAB — CBC
HCT: 42.7 % (ref 39.0–52.0)
Hemoglobin: 14.5 g/dL (ref 13.0–17.0)
MCH: 28.7 pg (ref 26.0–34.0)
MCHC: 34 g/dL (ref 30.0–36.0)
MCV: 84.4 fL (ref 80.0–100.0)
Platelets: 266 10*3/uL (ref 150–400)
RBC: 5.06 MIL/uL (ref 4.22–5.81)
RDW: 15.3 % (ref 11.5–15.5)
WBC: 8.4 10*3/uL (ref 4.0–10.5)
nRBC: 0 % (ref 0.0–0.2)

## 2021-01-18 LAB — URINE DRUG SCREEN, QUALITATIVE (ARMC ONLY)
Amphetamines, Ur Screen: NOT DETECTED
Barbiturates, Ur Screen: NOT DETECTED
Benzodiazepine, Ur Scrn: NOT DETECTED
Cannabinoid 50 Ng, Ur ~~LOC~~: NOT DETECTED
Cocaine Metabolite,Ur ~~LOC~~: NOT DETECTED
MDMA (Ecstasy)Ur Screen: NOT DETECTED
Methadone Scn, Ur: NOT DETECTED
Opiate, Ur Screen: NOT DETECTED
Phencyclidine (PCP) Ur S: NOT DETECTED
Tricyclic, Ur Screen: NOT DETECTED

## 2021-01-18 LAB — BASIC METABOLIC PANEL
Anion gap: 10 (ref 5–15)
BUN: 29 mg/dL — ABNORMAL HIGH (ref 8–23)
CO2: 24 mmol/L (ref 22–32)
Calcium: 9.1 mg/dL (ref 8.9–10.3)
Chloride: 103 mmol/L (ref 98–111)
Creatinine, Ser: 2.38 mg/dL — ABNORMAL HIGH (ref 0.61–1.24)
GFR, Estimated: 27 mL/min — ABNORMAL LOW (ref >60)
Glucose, Bld: 106 mg/dL — ABNORMAL HIGH (ref 70–99)
Potassium: 3.8 mmol/L (ref 3.5–5.1)
Sodium: 137 mmol/L (ref 135–145)

## 2021-01-18 LAB — PROTIME-INR
INR: 1 (ref 0.8–1.2)
Prothrombin Time: 13.7 seconds (ref 11.4–15.2)

## 2021-01-18 LAB — APTT: aPTT: 30 seconds (ref 24–36)

## 2021-01-18 LAB — VITAMIN B12: Vitamin B-12: 615 pg/mL (ref 180–914)

## 2021-01-18 MED ORDER — INSULIN ASPART 100 UNIT/ML IJ SOLN: 0.0000 [IU] | Freq: Every day | INTRAMUSCULAR | Status: DC

## 2021-01-18 MED ORDER — ACETAMINOPHEN 325 MG PO TABS: 650.0000 mg | ORAL_TABLET | ORAL | Status: DC | PRN

## 2021-01-18 MED ORDER — ACETAMINOPHEN 325 MG RE SUPP: 650.0000 mg | RECTAL | Status: DC | PRN

## 2021-01-18 MED ORDER — STROKE: EARLY STAGES OF RECOVERY BOOK: Freq: Once | Status: DC

## 2021-01-18 MED ORDER — LABETALOL HCL 5 MG/ML IV SOLN: 5.0000 mg | INTRAVENOUS | Status: DC | PRN

## 2021-01-18 MED ORDER — PANTOPRAZOLE SODIUM 40 MG PO TBEC: 40.0000 mg | DELAYED_RELEASE_TABLET | Freq: Every day | ORAL | Status: DC

## 2021-01-18 MED ORDER — CLOPIDOGREL BISULFATE 75 MG PO TABS: 75.0000 mg | ORAL_TABLET | Freq: Every day | ORAL | Status: DC

## 2021-01-18 MED ORDER — INSULIN ASPART 100 UNIT/ML IJ SOLN
0.0000 [IU] | Freq: Three times a day (TID) | INTRAMUSCULAR | Status: DC
  Administered 2021-01-18: 2 [IU] via SUBCUTANEOUS
  Filled 2021-01-18: qty 1

## 2021-01-18 MED ORDER — ASPIRIN EC 81 MG PO TBEC: 81.0000 mg | DELAYED_RELEASE_TABLET | Freq: Every day | ORAL | Status: DC

## 2021-01-18 MED ORDER — ADULT MULTIVITAMIN W/MINERALS CH: 1.0000 | ORAL_TABLET | Freq: Every day | ORAL | Status: DC

## 2021-01-18 MED ORDER — CARBIDOPA-LEVODOPA 25-100 MG PO TABS
1.0000 | ORAL_TABLET | Freq: Three times a day (TID) | ORAL | Status: DC
  Administered 2021-01-18: 1 via ORAL
  Filled 2021-01-18 (×3): qty 1

## 2021-01-18 MED ORDER — LEVOTHYROXINE SODIUM 50 MCG PO TABS: 75.0000 ug | ORAL_TABLET | Freq: Every day | ORAL | Status: DC

## 2021-01-18 MED ORDER — INSULIN GLARGINE 100 UNITS/ML SOLOSTAR PEN: 6.0000 [IU] | PEN_INJECTOR | SUBCUTANEOUS | Status: DC

## 2021-01-18 MED ORDER — ACETAMINOPHEN 160 MG/5ML PO SOLN
650.0000 mg | ORAL | Status: DC | PRN
  Filled 2021-01-18: qty 20.3

## 2021-01-18 MED ORDER — INSULIN GLARGINE-YFGN 100 UNIT/ML ~~LOC~~ SOLN
6.0000 [IU] | Freq: Every day | SUBCUTANEOUS | Status: DC
  Filled 2021-01-18: qty 0.06

## 2021-01-18 MED ORDER — ATORVASTATIN CALCIUM 20 MG PO TABS: 40.0000 mg | ORAL_TABLET | Freq: Every day | ORAL | Status: DC

## 2021-01-18 MED ORDER — SENNOSIDES-DOCUSATE SODIUM 8.6-50 MG PO TABS: 1.0000 | ORAL_TABLET | Freq: Every evening | ORAL | Status: DC | PRN

## 2021-01-18 MED ORDER — INSULIN GLARGINE-YFGN 100 UNIT/ML ~~LOC~~ SOLN
12.0000 [IU] | Freq: Every day | SUBCUTANEOUS | Status: DC
  Filled 2021-01-18: qty 0.12

## 2021-01-18 NOTE — ED Triage Notes (Signed)
Pt comes into the ED via POV c/o generalized weakness that has been ongoing for a couple months.  Pt had an MRI done to r/o Parkinsons, but the MD sent him over here for potential stroke.  PT ambulatory to triage at this time and in NAD.

## 2021-01-18 NOTE — ED Notes (Signed)
Pt offered ED room- states he still wants to leave. DO, Cox messaged. AMA status, risk of death/injury explained to patient, Pt verbalizes understanding. AMA form signed. Pt does not agree to stay to talk to provider.

## 2021-01-18 NOTE — H&P (Signed)
History and Physical   Victor Gould KGY:[id number] DOB: January 01, 1941 DOA: 01/18/2021  PCP: Rusty Aus, MD  Outpatient Specialists: Dr. Manuella Ghazi, neurology Patient coming from: PCP  I have personally briefly reviewed patient's old medical records in [place].  Chief Concern: Stroke  HPI: Victor Gould is a 80 y.o. male with medical history significant for hyperlipidemia, hypertension, insulin-dependent diabetes mellitus, acquired hypothyroid, GERD, CKD 3B/4, Parkinson's disease, left eye vision loss, presents emergency department for chief concerns of generalized weakness, tremors, difficulty with balance for the last few months.  He reports that he was at his baseline health when he received a call from his neurologist to present to the emergency department for chief concerns of acute stroke.  He reports that over the last few months he has been having weakness, feeling lightheaded, and off balance with tremors.  He reports that he has never been told he had strokes before.  He states he does not know he is ever had a stroke.  He denies facial asymmetry and or difficulty speaking or understanding others.  Had no trouble swallowing or dysphagia.  Social history: He lives at home with his wife.  He denies history of tobacco use, EtOH, recreational drug use.  He is currently retired and formerly worked as a Dealer for [employer].  Vaccination history: He is vaccinated for COVID-19, 3 doses of Pfizer  ROS: Constitutional: no weight change, no fever ENT/Mouth: no sore throat, no rhinorrhea Eyes: no eye pain, no vision changes Cardiovascular: no chest pain, no dyspnea,  no edema, no palpitations Respiratory: no cough, no sputum, no wheezing Gastrointestinal: no nausea, no vomiting, no diarrhea, no constipation Genitourinary: no urinary incontinence, no dysuria, no hematuria Musculoskeletal: no arthralgias, no myalgias Skin: no skin lesions, no pruritus, Neuro: + tremors and  imbalance, no loss of consciousness, no syncope Psych: no anxiety, no depression, + decrease appetite Heme/Lymph: no bruising, no bleeding  ED Course: Disgusted with emergency medicine provider, patient requiring hospitalization for chief concerns of acute stroke.  Vitals in the emergency department was remarkable for temperature 98.6, respiration rate 18, heart rate of 90, initial blood pressure 191/93, SPO2 of 97 percent on room air.  Labs in the emergency department was remarkable for sodium 137, potassium 3.8, chloride 103, bicarb 24, BUN 29, serum creatinine of 2.38, nonfasting blood glucose 106, WBC 8.4, hemoglobin 14.5, platelets 266.  Patient had an outpatient MRI of the brain without contrast ordered by PCP which was read as punctate acute cortical infarct within the bilateral left parietal lobe.  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 Cheral Marker has been consulted via secure chat, amion paging, and paging and we appreciate further recommendations - Neurologist recommends MRA of the head and neck and carotid ultrasound; increasing Plavix to 75 mg daily - Complete echo ordered - MRI of the brain as above - Fasting lipid and A1c ordered - Permissive hypertension  - Frequent neuro  vascular checks - Plavix 75 mg daily initiated, patient previously on Plavix 37.5 mg daily - PT, OT - Frequent  neurochecks - Heart healthy/carb modified at this time - Fall precaution and aspiration precaution  # Hyperlipidemia - atorvastatin 40 mg nightly ordered # Acquired hypothyroid - levothyroxine 75 mcg p.o. daily resumed # CKD 3B/4 - at baseline at this time # GERD - PPI  # History of hypertension-patient takes amlodipine 10 mg daily, losartan 100 mg daily, metoprolol succinate 25 mg daily, these have been held to allow for permissive hypertension  # DVT prophylaxis - a.m. team to resume pharmacologic DVT prophylaxis when a per  Chart reviewed.   DVT prophylaxis: SCDs Code Status: Full code Diet: Heart healthy/carb modified Family Communication: Updated spouse at bedside Disposition Plan: Pending clinical course Consults called: Neurology Admission status: MedSurg, observation, telemetry  Past Medical History:  Diagnosis Date   ASCVD (arteriosclerotic cardiovascular disease)    Cancer ([place])    prostate   Chronic kidney disease    Colon polyps    Constipation    Coronary artery disease    Diabetes mellitus    Family history of adverse reaction to anesthesia    children - PONV   GERD (gastroesophageal reflux disease)    Hemorrhoids    Hyperlipidemia    Hypertension    PCP [name] at [place]   Hypothyroidism    MI (myocardial infarction) ([place])    PONV (postoperative nausea and vomiting)    Thyroid disease    Vertigo    positional   Past Surgical History:  Procedure Laterality Date   APPENDECTOMY     CATARACT EXTRACTION W/PHACO Right 10/27/2018   Procedure: CATARACT EXTRACTION PHACO AND INTRAOCULAR LENS PLACEMENT ([place]) RIGHT, DIABETIC;  Surgeon: [name], MD;  Location: [place];  Service: Ophthalmology;  Laterality: Right;  diabetic - insulin   CATARACT EXTRACTION W/PHACO Left 11/25/2018   Procedure: CATARACT EXTRACTION PHACO AND INTRAOCULAR LENS PLACEMENT ([place]) LEFT DIABETIC;  Surgeon: [name], MD;  Location: [place];   Service: Ophthalmology;  Laterality: Left;   COLONOSCOPY     colonoscopy with polypectomy     COLONOSCOPY WITH PROPOFOL N/A 12/02/2017   Procedure: COLONOSCOPY WITH PROPOFOL;  Surgeon: [name], MD;  Location[facility] END[facility]COPY;  Service: Endoscopy;  Laterality: N/A;   CORONARY ARTERY BYPASS GRAFT N/A 08/28/2016   Procedure: CORONARY ARTERY BYPASS GRAFTING (CABG) x five, using left internal mammary artery and right leg greater saphenous vein harvested endoscopically - -LIMA to LAD, -SVG to OM, -SVG to DIAGONAL, -SEQ SVG to PDA, PLVB ;  Surgeon: [name], MD;  Location: [place];  Service: Open Heart Surgery;  Laterality: N/A;   CYSTOSCOPY N/A 09/29/2013   Procedure: CYSTOSCOPY;  Surgeon: [name], MD;  Location: [facility];  Service: Urology;  Laterality: N/A;   ESOPHAGOGASTRODUODENOSCOPY (EGD) WITH PROPOFOL N/A 12/02/2017   Procedure: ESOPHAGOGASTRODUODENOSCOPY (EGD) WITH PROPOFOL;  Surgeon: [name], MD;  Location: [facility] ENDOSCOPY;  Service: Endoscopy;  Laterality: N/A;   HERNIA REPAIR     inguinal bilaterally   LEFT HEART CATH AND CORONARY ANGIOGRAPHY N/A 08/27/2016   Procedure: Left Heart Cath and Coronary Angiography;  Surgeon: [name], MD;  Location: [place] CV LAB;  Service: Cardiovascular;  Laterality: N/A;   ROBOT ASSISTED LAPAROSCOPIC RADICAL PROSTATECTOMY  06/25/2011   Procedure: ROBOTIC ASSISTED LAPAROSCOPIC RADICAL PROSTATECTOMY LEVEL 2;  Surgeon: [name], MD;  Location: [facility];  Service: Urology;  Laterality: N/A;  TEE WITHOUT CARDIOVERSION N/A 08/28/2016   Procedure: TRANSESOPHAGEAL ECHOCARDIOGRAM (TEE);  Surgeon: [name], MD;  Location: [place];  Service: Open Heart Surgery;  Laterality: N/A;   UPPER GASTROINTESTINAL ENDOSCOPY     URETHRAL SLING N/A 09/29/2013   Procedure: MALE SLING;  Surgeon: [name], MD;  Location: [facility];  Service: Urology;  Laterality: N/A;   Social History:  reports that he has never smoked. He has never used  smokeless tobacco. He reports that he does not drink alcohol and does not use drugs.  Allergies  Allergen Reactions   Naproxen Sodium Shortness Of Breath   Other Hives   Oxycodone Hcl Nausea And Vomiting   Sulfa Antibiotics Rash and Hives   Tramadol Other (See Comments)    dizziness   Family History  Problem Relation Age of Onset   Heart attack Mother 56   Heart attack Brother    Family history: Family history reviewed and not pertinent  Prior to Admission medications   Medication Sig Start Date End Date Taking? Authorizing Provider  acetaminophen (TYLENOL) 500 MG tablet Take 500-1,000 mg by mouth daily as needed for mild pain or headache (pain).     [provider]  amLODipine (NORVASC) 10 MG tablet Take 10 mg by mouth daily.    [provider]  amLODipine (NORVASC) 5 MG tablet Take 1 tablet (5 mg total) by mouth daily. 12/08/18 03/08/19  Minna Merritts, MD  aspirin EC 81 MG tablet Take 81 mg by mouth daily.    [provider]  atorvastatin (LIPITOR) 40 MG tablet Take 1 tablet (40 mg total) by mouth daily at 6 PM. 09/03/16   Nani Skillern, PA-C  b complex vitamins tablet Take 1 tablet by mouth daily after breakfast.    [provider]  cholecalciferol (VITAMIN D) 1000 units tablet Take 1,000 Units by mouth daily after breakfast.    [provider]  clopidogrel (PLAVIX) 75 MG tablet Take 75 mg by mouth daily.    [provider]  hydrocortisone cream 1 % Apply 1 application topically daily as needed for itching.    [provider]  insulin glargine (LANTUS) 100 unit/mL SOPN Inject 6-12 Units into the skin See admin instructions. Inject 12 units 10 minutes before breakfast, and 6 units before bed    [provider]  insulin lispro (HUMALOG KWIKPEN) 100 UNIT/ML KiwkPen Inject 7-10 Units into the skin See admin instructions. Inject 7 units subcutaneously before breakfast, lunch and dinner - plus adjustment for  CBG 140-180 add 1 unit, 180-220 add 3 units, >220 add 3 units    [provider]  levothyroxine (SYNTHROID, LEVOTHROID) 75 MCG tablet Take 75 mcg by mouth daily before breakfast.    [provider]  losartan (COZAAR) 100 MG tablet Take 100 mg by mouth daily. 05/12/19   [provider]  metoprolol succinate (TOPROL XL) 25 MG 24 hr tablet Take 1 tablet (25 mg total) by mouth daily. 08/14/17   Minna Merritts, MD  Multiple Vitamin (MULTIVITAMIN WITH MINERALS) TABS tablet Take 1 tablet by mouth daily after breakfast.    [provider]  omeprazole (PRILOSEC) 20 MG capsule Take 20 mg by mouth daily before breakfast.     [provider]  ondansetron (ZOFRAN ODT) 4 MG disintegrating tablet Take 1 tablet (4 mg total) by mouth every 8 (eight) hours as needed for nausea or vomiting. Patient not taking: Reported on 09/15/2019 12/23/18   Ezequiel Essex, MD  Polyethyl Glycol-Propyl Glycol (SYSTANE FREE OP) Place 1 drop into both eyes daily as needed (dry eyes).     [provider]  Probiotic Product (PROBIOTIC ADVANCED PO) Take 1 capsule by mouth daily.    [provider]   Physical Exam: Vitals:   01/18/21 1058 01/18/21 1101  BP:  (!) 191/93  Pulse:  90  Resp:  18  Temp:  98.6 F (37 C)  TempSrc:  Oral  SpO2:  97%  Weight: 81 kg   Height: 5\' 9"  (1.753 m)    Constitutional: appears age-appropriate, NAD, calm, comfortable Eyes: PERRL, lids and conjunctivae normal ENMT: Mucous membranes are moist. Posterior pharynx clear of any exudate or lesions. Age-appropriate dentition. Hearing appropriate Neck: normal, supple, no masses, no thyromegaly Respiratory: clear to auscultation bilaterally, no wheezing, no crackles. Normal respiratory effort. No accessory muscle use.  Cardiovascular: Regular rate and rhythm, no murmurs / rubs / gallops. No extremity edema. 2+ pedal pulses. No carotid bruits.  Abdomen: no tenderness, no masses palpated, no  hepatosplenomegaly. Bowel sounds positive.  Musculoskeletal: no clubbing / cyanosis. No joint deformity upper and lower extremities. Good ROM, no contractures, no atrophy. Normal muscle tone.  Skin: no rashes, lesions, ulcers. No induration Neurologic: Sensation intact. Strength 5/5 in all 4.  Psychiatric: Normal judgment and insight. Alert and oriented x 3. Normal mood.   EKG: independently reviewed, showing sinus rhythm with rate of 88, QTc 425  Chest x-ray on Admission: I personally reviewed and I agree with radiologist reading as below.  MR BRAIN WO CONTRAST  Result Date: 01/18/2021 CLINICAL DATA:  Parkinson's disease. Additional history provided by scanning technologist: Patient reports tremors in both hands, gait difficulty. EXAM: MRI HEAD WITHOUT CONTRAST TECHNIQUE: Multiplanar, multiecho pulse sequences of the brain and surrounding structures were obtained without intravenous contrast. COMPARISON:  Brain MRI 06/23/2015. FINDINGS: Brain: Mild generalized cerebral and cerebellar atrophy. Punctate acute infarct within the lateral left parietal lobe (series 5, image 27) (series 7, image 16). Small chronic cortical infarcts within the posterior frontal lobes bilaterally, new from the brain MRI of 06/23/2015. Minimal chronic hemosiderin deposition at some of the infarct sites. Mild multifocal T2/FLAIR hyperintensity within the cerebral white matter, nonspecific but compatible with chronic small vessel ischemic disease. Chronic lacunar infarct within the medial left thalamus, new from the prior MRI. There are multiple chronic infarcts within the bilateral cerebellar hemispheres, increased in number as compared to the brain MRI of 06/23/2015. The largest infarct within the left cerebellar hemisphere measures 2.5 x 1.5 cm in transaxial dimensions (series 10, image 6). No evidence of an intracranial mass. No extra-axial fluid collection. No midline shift. Vascular: Unchanged from the prior examination,  there is signal abnormality within portions of the V4 right vertebral artery, suggesting chronic vessel occlusion. Flow voids otherwise preserved within the proximal large arterial vessels. Skull and upper cervical spine: No focal suspicious marrow lesion. Sinuses/Orbits: Visualized orbits show no acute finding. The right maxillary sinus is asymmetrically diminutive and near completely opacified. Impression #1 will be called to the ordering clinician or representative by the Radiologist Assistant, and communication documented in the PACS or [employer]. IMPRESSION: Punctate acute cortical infarct within the lateral left parietal lobe. Small chronic cortical infarcts within the posterior frontal lobes bilaterally, new from the brain MRI of 06/23/2015. Mild chronic small vessel ischemic changes within the cerebral white matter, stable. Chronic lacunar infarct within the left thalamus, new from the prior MRI. Multiple chronic infarcts within the bilateral cerebellar hemispheres, increased in  number as compared to the prior MRI. Redemonstrated signal abnormality within portions of the V4 right vertebral artery, suggesting chronic vessel occlusion. Mild generalized cerebral and cerebellar atrophy, progressed. The right maxillary sinus is asymmetrically diminutive and near completely opacified. Electronically Signed   By: Kellie Simmering D.O.   On: 01/18/2021 10:09    Labs on Admission: I have personally reviewed following labs  CBC: Recent Labs  Lab 01/18/21 1100  WBC 8.4  HGB 14.5  HCT 42.7  MCV 84.4  PLT 735   Basic Metabolic Panel: Recent Labs  Lab 01/18/21 1100  NA 137  K 3.8  CL 103  CO2 24  GLUCOSE 106*  BUN 29*  CREATININE 2.38*  CALCIUM 9.1   GFR: Estimated Creatinine Clearance: 23.9 mL/min (A) (by C-G formula based on SCr of 2.38 mg/dL (H)).  Urine analysis:    Component Value Date/Time   COLORURINE YELLOW 05/28/2019 1151   APPEARANCEUR CLEAR 05/28/2019 1151   LABSPEC 1.018  05/28/2019 1151   PHURINE 5.0 05/28/2019 1151   GLUCOSEU 150 (A) 05/28/2019 1151   HGBUR NEGATIVE 05/28/2019 1151   BILIRUBINUR NEGATIVE 05/28/2019 1151   KETONESUR 20 (A) 05/28/2019 1151   PROTEINUR 100 (A) 05/28/2019 1151   UROBILINOGEN 0.2 10/13/2013 0234   NITRITE NEGATIVE 05/28/2019 1151   LEUKOCYTESUR NEGATIVE 05/28/2019 1151   Dr. Tobie Poet [facility]  If 7PM-7AM, please contact overnight-coverage provider If 7AM-7PM, please contact day coverage provider [email or web address]  01/18/2021, 2:01 PM

## 2021-01-18 NOTE — ED Provider Notes (Signed)
Emergency Medicine Provider Triage Evaluation Note  Victor Gould , a 80 y.o. male  was evaluated in triage.  Pt complains of generalized weakness. Sent to the ER after MRI was concerning for CVA.  Review of Systems  Positive: Weakness Negative: Headache, blurred vision  Physical Exam  Ht 5\' 9"  (1.753 m)   Wt 81 kg   BMI 26.37 kg/m  Gen:   Awake, no distress   Resp:  Normal effort  MSK:   Moves extremities without difficulty  Other:    Medical Decision Making  Medically screening exam initiated at 10:59 AM.  Appropriate orders placed.  [name] was informed that the remainder of the evaluation will be completed by another provider, this initial triage assessment does not replace that evaluation, and the importance of remaining in the ED until their evaluation is complete.    Victorino Dike, FNP 01/19/21 0914    Lucrezia Starch, MD 01/19/21 [phone]

## 2021-01-18 NOTE — Procedures (Signed)
Will do eeg when pt not in hallway in ED

## 2021-01-18 NOTE — Consult Note (Signed)
NEURO HOSPITALIST CONSULT NOTE   Requestig physician: Dr. Tobie Poet  Reason for Consult: Acute punctate cortical infarction within the lateral left parietal lobe.   History obtained from:  Chart     HPI:                                                                                                                                          Victor Gould is an 80 y.o. male with a PMHx of CKD, cancer, CAD s/p CABG, HTN, IDDM, hyperlipidemia and hypothyroidism.  He presents from outpatient neurology for chief concern of acute stroke. He states he has never been told he has had strokes before. MRI outpatient revealed a punctate acute cortical infarct within the lateral left parietal lobe, as well as small chronic cortical infarcts bilaterally within the posterior frontal lobes and a chronic lacunar infarct within the left thalamus, new from the prior MRI. Multiple chronic infarcts within the bilateral cerebellar hemispheres were also noted.   Currently on aspirin 81 mg and 1/2 tablet of 75 mg Plavix daily.   Past Medical History:  Diagnosis Date   ASCVD (arteriosclerotic cardiovascular disease)    Cancer (HCC)    prostate   Chronic kidney disease    Colon polyps    Constipation    Coronary artery disease    Diabetes mellitus    Family history of adverse reaction to anesthesia    children - PONV   GERD (gastroesophageal reflux disease)    Hemorrhoids    Hyperlipidemia    Hypertension    PCP [name] at [facility]   Hypothyroidism    MI (myocardial infarction) ([place])    PONV (postoperative nausea and vomiting)    Thyroid disease    Vertigo    positional    Past Surgical History:  Procedure Laterality Date   APPENDECTOMY     CATARACT EXTRACTION W/PHACO Right 10/27/2018   Procedure: CATARACT EXTRACTION PHACO AND INTRAOCULAR LENS PLACEMENT (IOC) RIGHT, DIABETIC;  Surgeon: [name], MD;  Location: [facility];  Service: Ophthalmology;  Laterality:  Right;  diabetic - insulin   CATARACT EXTRACTION W/PHACO Left 11/25/2018   Procedure: CATARACT EXTRACTION PHACO AND INTRAOCULAR LENS PLACEMENT ([place]) LEFT DIABETIC;  Surgeon: [name], MD;  Location: [facility];  Service: Ophthalmology;  Laterality: Left;   COLONOSCOPY     colonoscopy with polypectomy     COLONOSCOPY WITH PROPOFOL N/A 12/02/2017   Procedure: COLONOSCOPY WITH PROPOFOL;  Surgeon: [name], MD;  [facility] END[facility]COPY;  Service: Endoscopy;  Laterality: N/A;   CORONARY ARTERY BYPASS GRAFT N/A 08/28/2016   Procedure: CORONARY ARTERY BYPASS GRAFTING (CABG) x five, using left internal mammary artery and right leg greater saphenous vein harvested endoscopically - -LIMA to LAD, -SVG to OM, -SVG  to DIAGONAL, -SEQ SVG to PDA, PLVB ;  Surgeon: [name], MD;  Location: [place];  Service: Open Heart Surgery;  Laterality: N/A;   CYSTOSCOPY N/A 09/29/2013   Procedure: CYSTOSCOPY;  Surgeon: [name], MD;  Location: [facility];  Service: Urology;  Laterality: N/A;   ESOPHAGOGASTRODUODENOSCOPY (EGD) WITH PROPOFOL N/A 12/02/2017   Procedure: ESOPHAGOGASTRODUODENOSCOPY (EGD) WITH PROPOFOL;  Surgeon: [name], MD;  Location: [facility] ENDOSCOPY;  Service: Endoscopy;  Laterality: N/A;   HERNIA REPAIR     inguinal bilaterally   LEFT HEART CATH AND CORONARY ANGIOGRAPHY N/A 08/27/2016   Procedure: Left Heart Cath and Coronary Angiography;  Surgeon: [name], MD;  Location: [facility];  Service: Cardiovascular;  Laterality: N/A;   ROBOT ASSISTED LAPAROSCOPIC RADICAL PROSTATECTOMY  06/25/2011   Procedure: ROBOTIC ASSISTED LAPAROSCOPIC RADICAL PROSTATECTOMY LEVEL 2;  Surgeon: [name], MD;  Location: [facility];  Service: Urology;  Laterality: N/A;   TEE WITHOUT CARDIOVERSION N/A 08/28/2016   Procedure: TRANSESOPHAGEAL ECHOCARDIOGRAM (TEE);  Surgeon: [name], MD;  Location: [place];  Service: Open Heart Surgery;  Laterality: N/A;   UPPER GASTROINTESTINAL  ENDOSCOPY     URETHRAL SLING N/A 09/29/2013   Procedure: MALE SLING;  Surgeon: [name], MD;  Location: [facility];  Service: Urology;  Laterality: N/A;    Family History  Problem Relation Age of Onset   Heart attack Mother 65   Heart attack Brother               Social History:  reports that he has never smoked. He has never used smokeless tobacco. He reports that he does not drink alcohol and does not use drugs.  Allergies  Allergen Reactions   Naproxen Sodium Shortness Of Breath   Other Hives   Oxycodone Hcl Nausea And Vomiting   Sulfa Antibiotics Rash and Hives   Tramadol Other (See Comments)    dizziness    MEDICATIONS:                                                                                                                     No current facility-administered medications on file prior to encounter.   Current Outpatient Medications on File Prior to Encounter  Medication Sig Dispense Refill   Alfalfa 500 MG TABS Take 500 mg by mouth 2 (two) times daily.     amLODipine (NORVASC) 10 MG tablet Take 10 mg by mouth daily.     aspirin EC 81 MG tablet Take 81 mg by mouth daily.     b complex vitamins tablet Take 1 tablet by mouth daily after breakfast.     carbidopa-levodopa (SINEMET IR) 25-100 MG tablet Take 1 tablet by mouth 3 (three) times daily.     cholecalciferol (VITAMIN D) 1000 units tablet Take 1,000 Units by mouth daily after breakfast.     clopidogrel (PLAVIX) 75 MG tablet Take 75 mg by mouth daily.     insulin glargine (LANTUS) 100 unit/mL SOPN  Inject 6-12 Units into the skin See admin instructions. Inject 12 units 10 minutes before breakfast, and 6 units before bed     insulin lispro (HUMALOG) 100 UNIT/ML KiwkPen Inject 7-10 Units into the skin See admin instructions. Inject 7 units subcutaneously before breakfast, lunch and dinner - plus adjustment for CBG 140-180 add 1 unit, 180-220 add 3 units, >220 add 3 units     levothyroxine (SYNTHROID, LEVOTHROID) 75  MCG tablet Take 75 mcg by mouth daily before breakfast.     Multiple Vitamin (MULTIVITAMIN WITH MINERALS) TABS tablet Take 1 tablet by mouth daily after breakfast.     omeprazole (PRILOSEC) 20 MG capsule Take 20 mg by mouth daily before breakfast.      acetaminophen (TYLENOL) 500 MG tablet Take 500-1,000 mg by mouth daily as needed for mild pain or headache (pain).      amLODipine (NORVASC) 5 MG tablet Take 1 tablet (5 mg total) by mouth daily. 180 tablet 3   atorvastatin (LIPITOR) 40 MG tablet Take 1 tablet (40 mg total) by mouth daily at 6 PM. (Patient not taking: Reported on 01/18/2021) 30 tablet 1   doxazosin (CARDURA) 2 MG tablet Take 2 mg by mouth at bedtime. (Patient not taking: No sig reported)     hydrocortisone cream 1 % Apply 1 application topically daily as needed for itching.     losartan (COZAAR) 100 MG tablet Take 100 mg by mouth daily. (Patient not taking: Reported on 01/18/2021)     metoprolol succinate (TOPROL XL) 25 MG 24 hr tablet Take 1 tablet (25 mg total) by mouth daily. (Patient not taking: Reported on 01/18/2021) 90 tablet 3   ondansetron (ZOFRAN ODT) 4 MG disintegrating tablet Take 1 tablet (4 mg total) by mouth every 8 (eight) hours as needed for nausea or vomiting. (Patient not taking: No sig reported) 20 tablet 0   Polyethyl Glycol-Propyl Glycol (SYSTANE FREE OP) Place 1 drop into both eyes daily as needed (dry eyes).      Probiotic Product (PROBIOTIC ADVANCED PO) Take 1 capsule by mouth daily. (Patient not taking: Reported on 01/18/2021)     traZODone (DESYREL) 50 MG tablet Take 50 mg by mouth at bedtime. (Patient not taking: No sig reported)       ROS:                                                                                                                                       Unable to obtain. Patient discharged from the ED prior to Neurology being able to examine.   Blood pressure (!) 180/87, pulse 79, temperature 98.6 F (37 C), temperature source Oral,  resp. rate 18, height 5\' 9"  (1.753 m), weight 81 kg, SpO2 97 %.   General Examination:  Unable to obtain. Patient discharged from the ED prior to Neurology being able to examine.   Lab Results: Basic Metabolic Panel: Recent Labs  Lab 01/18/21 1100  NA 137  K 3.8  CL 103  CO2 24  GLUCOSE 106*  BUN 29*  CREATININE 2.38*  CALCIUM 9.1    CBC: Recent Labs  Lab 01/18/21 1100  WBC 8.4  HGB 14.5  HCT 42.7  MCV 84.4  PLT 266    Cardiac Enzymes: No results for input(s): CKTOTAL, CKMB, CKMBINDEX, TROPONINI in the last 168 hours.  Lipid Panel: No results for input(s): CHOL, TRIG, HDL, CHOLHDL, VLDL, LDLCALC in the last 168 hours.  Imaging: MR BRAIN WO CONTRAST  Result Date: 01/18/2021 CLINICAL DATA:  Parkinson's disease. Additional history provided by scanning technologist: Patient reports tremors in both hands, gait difficulty. EXAM: MRI HEAD WITHOUT CONTRAST TECHNIQUE: Multiplanar, multiecho pulse sequences of the brain and surrounding structures were obtained without intravenous contrast. COMPARISON:  Brain MRI 06/23/2015. FINDINGS: Brain: Mild generalized cerebral and cerebellar atrophy. Punctate acute infarct within the lateral left parietal lobe (series 5, image 27) (series 7, image 16). Small chronic cortical infarcts within the posterior frontal lobes bilaterally, new from the brain MRI of 06/23/2015. Minimal chronic hemosiderin deposition at some of the infarct sites. Mild multifocal T2/FLAIR hyperintensity within the cerebral white matter, nonspecific but compatible with chronic small vessel ischemic disease. Chronic lacunar infarct within the medial left thalamus, new from the prior MRI. There are multiple chronic infarcts within the bilateral cerebellar hemispheres, increased in number as compared to the brain MRI of 06/23/2015. The largest infarct within the left cerebellar  hemisphere measures 2.5 x 1.5 cm in transaxial dimensions (series 10, image 6). No evidence of an intracranial mass. No extra-axial fluid collection. No midline shift. Vascular: Unchanged from the prior examination, there is signal abnormality within portions of the V4 right vertebral artery, suggesting chronic vessel occlusion. Flow voids otherwise preserved within the proximal large arterial vessels. Skull and upper cervical spine: No focal suspicious marrow lesion. Sinuses/Orbits: Visualized orbits show no acute finding. The right maxillary sinus is asymmetrically diminutive and near completely opacified. Impression #1 will be called to the ordering clinician or representative by the Radiologist Assistant, and communication documented in the PACS or [employer]. IMPRESSION: Punctate acute cortical infarct within the lateral left parietal lobe. Small chronic cortical infarcts within the posterior frontal lobes bilaterally, new from the brain MRI of 06/23/2015. Mild chronic small vessel ischemic changes within the cerebral white matter, stable. Chronic lacunar infarct within the left thalamus, new from the prior MRI. Multiple chronic infarcts within the bilateral cerebellar hemispheres, increased in number as compared to the prior MRI. Redemonstrated signal abnormality within portions of the V4 right vertebral artery, suggesting chronic vessel occlusion. Mild generalized cerebral and cerebellar atrophy, progressed. The right maxillary sinus is asymmetrically diminutive and near completely opacified. Electronically Signed   By: Kellie Simmering D.O.   On: 01/18/2021 10:09     Assessment: 80 year old male with acute left parietal lobe cortically based infarct.  1. Patient discharged from the ED prior to Neurology being able to examine. 2. EKG: Nonspecific ST abnormality 3. Stroke risk factors: CKD, cancer history, CAD, HTN, IDDM and hyperlipidemia  Has poor renal function secondary to CKD. Will order MRA of  head and carotid ultrasound instead of CTA.   Recommendations: (note that these are general recommendations only, based on incomplete data as not all tests were performed and patient discharged from the ED  prior to Neurology being able to examine). 1. Echocardiogram was ordered, but was not completed prior to patient being discharged from the ED.   2. Given lack of full work up, best course of action would be to continue ASA 81 mg po qd and increase Plavix to 75 mg po qd (full tablet).  3. MRA of head and carotid ultrasound are recommended to be performed as soon as possible. Patient discharged prior to these studies being obtained.   4. BP management is standard of care for strokes.  5. Cardiac telemetry 6. Frequent neuro checks 7. Glycemic control 8. Tylenol PRN fever   Electronically signed: Dr. Kerney Elbe 01/18/2021, 3:51 PM

## 2021-01-18 NOTE — ED Triage Notes (Signed)
FIRST NURSE NOTE: pt was sent from Dr Manuella Ghazi officer after having a MRI for possible stroke, pt states he has been having sx for a couple of months. Pt is ambulatory with NAD noted on arrival

## 2021-01-18 NOTE — ED Provider Notes (Signed)
[facility] Emergency Department Provider Note  ____________________________________________   Event Date/Time   First MD Initiated Contact with Patient 01/18/21 1334     (approximate)  I have reviewed the triage vital signs and the nursing notes.   HISTORY  Chief Complaint Weakness   HPI Victor Gould is a 80 y.o. male with a past medical history of ASCVD, prostate cancer, CKD, CAD, DM, HTN, HDL, who presents after being referred to the ED after he underwent outpatient MRI to work-up possible Parkinson's with radiology concerned that patient may have sustained an acute stroke.  Patient states he has had a little bit of tremor in his hands for a while now and has very little visual acuity is left eye last several weeks since he was told he had a "stroke in my eye".  He denies any acute focal weakness numbness or tingling, vertigo or acute changes in vision.  No recent falls or injuries.  He denies any headache, earache, sore throat, nausea, vomiting, diarrhea, dysuria, rash or other acute sick symptoms.  He is on aspirin and Plavix.         Past Medical History:  Diagnosis Date   ASCVD (arteriosclerotic cardiovascular disease)    Cancer ([place])    prostate   Chronic kidney disease    Colon polyps    Constipation    Coronary artery disease    Diabetes mellitus    Family history of adverse reaction to anesthesia    children - PONV   GERD (gastroesophageal reflux disease)    Hemorrhoids    Hyperlipidemia    Hypertension    PCP [name] at [facility]   Hypothyroidism    MI (myocardial infarction) ([place])    PONV (postoperative nausea and vomiting)    Thyroid disease    Vertigo    positional    Patient Active Problem List   Diagnosis Date Noted   Mixed hyperlipidemia 08/14/2017   Musculoskeletal chest pain 12/24/2016   Coronary artery disease of native artery of native heart with stable angina pectoris ([place]) 12/23/2016   Chronic diastolic  CHF (congestive heart failure) ([place]) 12/23/2016   Essential hypertension 09/20/2016   Atrial fibrillation ([place]) 09/20/2016   Chronic renal disease, stage 3, moderately decreased glomerular filtration rate between 30-59 mL/min/1.73 square meter ([place]) 09/20/2016   Type 2 diabetes mellitus without complications ([place]) 54/27/0623   ASCVD (arteriosclerotic cardiovascular disease) 09/10/2016   S/P CABG x 5    NSTEMI (non-ST elevated myocardial infarction) ([place]) 08/25/2016   History of prostate cancer 05/08/2016   Low serum vitamin D 11/04/2015   Type 1 diabetes mellitus on insulin therapy ([place]) 08/15/2015   Type 1 DM with CKD stage 3 and hypertension ([place]) 08/15/2015   Acquired hypothyroidism 07/29/2014   Incontinence of urine 09/29/2013    Past Surgical History:  Procedure Laterality Date   APPENDECTOMY     CATARACT EXTRACTION W/PHACO Right 10/27/2018   Procedure: CATARACT EXTRACTION PHACO AND INTRAOCULAR LENS PLACEMENT (IOC) RIGHT, DIABETIC;  Surgeon: [name], MD;  Location: [facility];  Service: Ophthalmology;  Laterality: Right;  diabetic - insulin   CATARACT EXTRACTION W/PHACO Left 11/25/2018   Procedure: CATARACT EXTRACTION PHACO AND INTRAOCULAR LENS PLACEMENT ([place]) LEFT DIABETIC;  Surgeon: [name], MD;  Location: [place];  Service: Ophthalmology;  Laterality: Left;   COLONOSCOPY     colonoscopy with polypectomy     COLONOSCOPY WITH PROPOFOL N/A 12/02/2017   Procedure: COLONOSCOPY WITH PROPOFOL;  Surgeon: Vira Agar,  Gavin Pound, MD;  Location: [facility];  Service: Endoscopy;  Laterality: N/A;   CORONARY ARTERY BYPASS GRAFT N/A 08/28/2016   Procedure: CORONARY ARTERY BYPASS GRAFTING (CABG) x five, using left internal mammary artery and right leg greater saphenous vein harvested endoscopically - -LIMA to LAD, -SVG to OM, -SVG to DIAGONAL, -SEQ SVG to PDA, PLVB ;  Surgeon: [name], MD;  Location: [place];  Service: Open Heart Surgery;  Laterality:  N/A;   CYSTOSCOPY N/A 09/29/2013   Procedure: CYSTOSCOPY;  Surgeon: [name], MD;  Location: [facility];  Service: Urology;  Laterality: N/A;   ESOPHAGOGASTRODUODENOSCOPY (EGD) WITH PROPOFOL N/A 12/02/2017   Procedure: ESOPHAGOGASTRODUODENOSCOPY (EGD) WITH PROPOFOL;  Surgeon: [name], MD;  Location: [facility];  Service: Endoscopy;  Laterality: N/A;   HERNIA REPAIR     inguinal bilaterally   LEFT HEART CATH AND CORONARY ANGIOGRAPHY N/A 08/27/2016   Procedure: Left Heart Cath and Coronary Angiography;  Surgeon: [name], MD;  Location: [place] [facility];  Service: Cardiovascular;  Laterality: N/A;   ROBOT ASSISTED LAPAROSCOPIC RADICAL PROSTATECTOMY  06/25/2011   Procedure: ROBOTIC ASSISTED LAPAROSCOPIC RADICAL PROSTATECTOMY LEVEL 2;  Surgeon: [name], MD;  Location: [facility];  Service: Urology;  Laterality: N/A;   TEE WITHOUT CARDIOVERSION N/A 08/28/2016   Procedure: TRANSESOPHAGEAL ECHOCARDIOGRAM (TEE);  Surgeon: [name], MD;  Location: [place];  Service: Open Heart Surgery;  Laterality: N/A;   UPPER GASTROINTESTINAL ENDOSCOPY     URETHRAL SLING N/A 09/29/2013   Procedure: MALE SLING;  Surgeon: [name], MD;  Location: [facility];  Service: Urology;  Laterality: N/A;    Prior to Admission medications   Medication Sig Start Date End Date Taking? Authorizing Provider  acetaminophen (TYLENOL) 500 MG tablet Take 500-1,000 mg by mouth daily as needed for mild pain or headache (pain).     [provider]  amLODipine (NORVASC) 10 MG tablet Take 10 mg by mouth daily.    [provider]  amLODipine (NORVASC) 5 MG tablet Take 1 tablet (5 mg total) by mouth daily. 12/08/18 03/08/19  Minna Merritts, MD  aspirin EC 81 MG tablet Take 81 mg by mouth daily.    [provider]  atorvastatin (LIPITOR) 40 MG tablet Take 1 tablet (40 mg total) by mouth daily at 6 PM. 09/03/16   Nani Skillern, PA-C  b complex vitamins tablet Take 1 tablet by mouth  daily after breakfast.    [provider]  cholecalciferol (VITAMIN D) 1000 units tablet Take 1,000 Units by mouth daily after breakfast.    [provider]  clopidogrel (PLAVIX) 75 MG tablet Take 75 mg by mouth daily.    [provider]  hydrocortisone cream 1 % Apply 1 application topically daily as needed for itching.    [provider]  insulin glargine (LANTUS) 100 unit/mL SOPN Inject 6-12 Units into the skin See admin instructions. Inject 12 units 10 minutes before breakfast, and 6 units before bed    [provider]  insulin lispro (HUMALOG KWIKPEN) 100 UNIT/ML KiwkPen Inject 7-10 Units into the skin See admin instructions. Inject 7 units subcutaneously before breakfast, lunch and dinner - plus adjustment for CBG 140-180 add 1 unit, 180-220 add 3 units, >220 add 3 units    [provider]  levothyroxine (SYNTHROID, LEVOTHROID) 75 MCG tablet Take 75 mcg by mouth daily before breakfast.    [provider]  losartan (COZAAR) 100 MG tablet Take 100  mg by mouth daily. 05/12/19   [provider]  metoprolol succinate (TOPROL XL) 25 MG 24 hr tablet Take 1 tablet (25 mg total) by mouth daily. 08/14/17   Minna Merritts, MD  Multiple Vitamin (MULTIVITAMIN WITH MINERALS) TABS tablet Take 1 tablet by mouth daily after breakfast.    [provider]  omeprazole (PRILOSEC) 20 MG capsule Take 20 mg by mouth daily before breakfast.     [provider]  ondansetron (ZOFRAN ODT) 4 MG disintegrating tablet Take 1 tablet (4 mg total) by mouth every 8 (eight) hours as needed for nausea or vomiting. Patient not taking: Reported on 09/15/2019 12/23/18   Rancour, Annie Main, MD  Polyethyl Glycol-Propyl Glycol (SYSTANE FREE OP) Place 1 drop into both eyes daily as needed (dry eyes).     [provider]  Probiotic Product (PROBIOTIC ADVANCED PO) Take 1 capsule by mouth daily.    [provider]    Allergies Naproxen  sodium, Other, Oxycodone hcl, Sulfa antibiotics, and Tramadol  Family History  Problem Relation Age of Onset   Heart attack Mother 74   Heart attack Brother     Social History Social History   Tobacco Use   Smoking status: Never   Smokeless tobacco: Never  Vaping Use   Vaping Use: Never used  Substance Use Topics   Alcohol use: No   Drug use: No    Review of Systems  Review of Systems  Constitutional:  Negative for chills and fever.  HENT:  Negative for sore throat.   Eyes:  Negative for pain.  Respiratory:  Negative for cough and stridor.   Cardiovascular:  Negative for chest pain.  Gastrointestinal:  Negative for vomiting.  Genitourinary:  Negative for dysuria.  Musculoskeletal:  Negative for myalgias.  Skin:  Negative for rash.  Neurological:  Positive for weakness. Negative for seizures, loss of consciousness and headaches.  Psychiatric/Behavioral:  Negative for suicidal ideas.   All other systems reviewed and are negative.    ____________________________________________   PHYSICAL EXAM:  VITAL SIGNS: ED Triage Vitals  Enc Vitals Group     BP 01/18/21 1101 (!) 191/93     Pulse Rate 01/18/21 1101 90     Resp 01/18/21 1101 18     Temp 01/18/21 1101 98.6 F (37 C)     Temp Source 01/18/21 1101 Oral     SpO2 01/18/21 1101 97 %     Weight 01/18/21 1058 178 lb 9.2 oz (81 kg)     Height 01/18/21 1058 5\' 9"  (1.753 m)     Head Circumference --      Peak Flow --      Pain Score 01/18/21 1058 0     Pain Loc --      Pain Edu? --      Excl. in [place]? --    Vitals:   01/18/21 1101  BP: (!) 191/93  Pulse: 90  Resp: 18  Temp: 98.6 F (37 C)  SpO2: 97%   Physical Exam Vitals and nursing note reviewed.  Constitutional:      Appearance: He is well-developed.  HENT:     Head: Normocephalic and atraumatic.     Right Ear: External ear normal.     Left Ear: External ear normal.     Nose: Nose normal.  Eyes:     Conjunctiva/sclera: Conjunctivae normal.   Cardiovascular:     Rate and Rhythm: Normal rate and regular rhythm.     Heart sounds: No  murmur heard. Pulmonary:     Effort: Pulmonary effort is normal. No respiratory distress.     Breath sounds: Normal breath sounds.  Abdominal:     Palpations: Abdomen is soft.     Tenderness: There is no abdominal tenderness.  Musculoskeletal:     Cervical back: Neck supple.  Skin:    General: Skin is warm and dry.     Capillary Refill: Capillary refill takes less than 2 seconds.  Neurological:     Mental Status: He is alert and oriented to person, place, and time.     Motor: Tremor present.  Psychiatric:        Mood and Affect: Mood normal.    W/ exception of visual acuity decreased in L eye Cranial nerves II through XII grossly intact.  No pronator drift.  No finger dysmetria.  Symmetric 5/5 strength of all extremities.  Sensation intact to light touch in all extremities.   ____________________________________________   LABS (all labs ordered are listed, but only abnormal results are displayed)  Labs Reviewed  BASIC METABOLIC PANEL - Abnormal; Notable for the following components:      Result Value   Glucose, Bld 106 (*)    BUN 29 (*)    Creatinine, Ser 2.38 (*)    GFR, Estimated 27 (*)    All other components within normal limits  RESP PANEL BY RT-PCR (FLU A&B, COVID) ARPGX2  CBC  URINALYSIS, COMPLETE (UACMP) WITH MICROSCOPIC  PROTIME-INR  APTT   ____________________________________________  EKG  ECG shows sinus rhythm with a ventricular rate of 58, normal axis and unremarkable intervals.  There is some artifact in inferior and lateral leads without clear evidence of acute ischemia ____________________________________________  RADIOLOGY  ED MD interpretation: MRI brain obtained earlier prior to arrival reviewed and remarkable for an acute punctate cortical infarct in left parietal lobe.  There is also evidence of small chronic cortical infarct in the posterior frontal lobes  bilaterally and chronic small vessel ischemic changes in the cerebral white matter.  There is a chronic lacunar infarct in the left thalamus and multiple chronic infarcts in the bilateral cerebral hemispheres.  There is also possible some chronic renal occlusion in V4 and mild generalized cerebral atrophy.  Official radiology report(s): MR BRAIN WO CONTRAST  Result Date: 01/18/2021 CLINICAL DATA:  Parkinson's disease. Additional history provided by scanning technologist: Patient reports tremors in both hands, gait difficulty. EXAM: MRI HEAD WITHOUT CONTRAST TECHNIQUE: Multiplanar, multiecho pulse sequences of the brain and surrounding structures were obtained without intravenous contrast. COMPARISON:  Brain MRI 06/23/2015. FINDINGS: Brain: Mild generalized cerebral and cerebellar atrophy. Punctate acute infarct within the lateral left parietal lobe (series 5, image 27) (series 7, image 16). Small chronic cortical infarcts within the posterior frontal lobes bilaterally, new from the brain MRI of 06/23/2015. Minimal chronic hemosiderin deposition at some of the infarct sites. Mild multifocal T2/FLAIR hyperintensity within the cerebral white matter, nonspecific but compatible with chronic small vessel ischemic disease. Chronic lacunar infarct within the medial left thalamus, new from the prior MRI. There are multiple chronic infarcts within the bilateral cerebellar hemispheres, increased in number as compared to the brain MRI of 06/23/2015. The largest infarct within the left cerebellar hemisphere measures 2.5 x 1.5 cm in transaxial dimensions (series 10, image 6). No evidence of an intracranial mass. No extra-axial fluid collection. No midline shift. Vascular: Unchanged from the prior examination, there is signal abnormality within portions of the V4 right vertebral artery, suggesting chronic vessel occlusion. Flow voids  otherwise preserved within the proximal large arterial vessels. Skull and upper cervical  spine: No focal suspicious marrow lesion. Sinuses/Orbits: Visualized orbits show no acute finding. The right maxillary sinus is asymmetrically diminutive and near completely opacified. Impression #1 will be called to the ordering clinician or representative by the Radiologist Assistant, and communication documented in the PACS or [employer]. IMPRESSION: Punctate acute cortical infarct within the lateral left parietal lobe. Small chronic cortical infarcts within the posterior frontal lobes bilaterally, new from the brain MRI of 06/23/2015. Mild chronic small vessel ischemic changes within the cerebral white matter, stable. Chronic lacunar infarct within the left thalamus, new from the prior MRI. Multiple chronic infarcts within the bilateral cerebellar hemispheres, increased in number as compared to the prior MRI. Redemonstrated signal abnormality within portions of the V4 right vertebral artery, suggesting chronic vessel occlusion. Mild generalized cerebral and cerebellar atrophy, progressed. The right maxillary sinus is asymmetrically diminutive and near completely opacified. Electronically Signed   By: Kellie Simmering D.O.   On: 01/18/2021 10:09    ____________________________________________   PROCEDURES  Procedure(s) performed (including Critical Care):  Procedures   ____________________________________________   INITIAL IMPRESSION / ASSESSMENT AND PLAN / ED COURSE      Patient presents with above-stated history and exam after being found to the ED when outpatient MRI ordered as part of possible Parkinson's work-up was concerning for an acute CVA.  On arrival patient is hypertensive with a BP of 191/93 with otherwise stable vital signs on room air.  With exception of some chronic decreased acuity in his left eye and bilateral upper extremity tremor no obvious focal neurological deficits.  No history of recent trauma or injuries and patient does not appear septic or intoxicated.  No  obvious foci of infection on exam.  MRI was reviewed by myself:   MRI brain obtained earlier prior to arrival reviewed and remarkable for an acute punctate cortical infarct in left parietal lobe.  There is also evidence of small chronic cortical infarct in the posterior frontal lobes bilaterally and chronic small vessel ischemic changes in the cerebral white matter.  There is a chronic lacunar infarct in the left thalamus and multiple chronic infarcts in the bilateral cerebral hemispheres.  There is also possible some chronic renal occlusion in V4 and mild generalized cerebral atrophy.  BMP shows evidence of CKD without other significant electrolyte or metabolic derangements.  CBC shows no leukocytosis or acute anemia.  Appended to medicine service for further evaluation and management of acute CVA without significant acute neurological deficit.     ____________________________________________   FINAL CLINICAL IMPRESSION(S) / ED DIAGNOSES  Final diagnoses:  Cerebrovascular accident (CVA), unspecified mechanism ([place])    Medications - No data to display   ED Discharge Orders     None        Note:  This document was prepared using Dragon voice recognition software and may include unintentional dictation errors.    Lucrezia Starch, MD 01/18/21 1356

## 2021-01-18 NOTE — ED Notes (Signed)
Pt given water and small snack per his request.

## 2021-01-18 NOTE — ED Notes (Signed)
Pt states he wants to check out . Will attempt to find room in ED

## 2021-01-20 ENCOUNTER — Other Ambulatory Visit: Payer: Self-pay | Admitting: Internal Medicine

## 2021-01-20 DIAGNOSIS — I639 Cerebral infarction, unspecified: Secondary | ICD-10-CM

## 2021-02-01 ENCOUNTER — Ambulatory Visit
Admission: RE | Admit: 2021-02-01 | Discharge: 2021-02-01 | Disposition: A | Discharge status: Home / Self Care | Payer: Medicare Other | Source: Ambulatory Visit | Attending: Internal Medicine | Admitting: Internal Medicine

## 2021-02-01 ENCOUNTER — Other Ambulatory Visit: Payer: Self-pay

## 2021-02-01 DIAGNOSIS — I639 Cerebral infarction, unspecified: Secondary | ICD-10-CM | POA: Diagnosis present

## 2021-03-02 ENCOUNTER — Emergency Department (HOSPITAL_COMMUNITY): Payer: No Typology Code available for payment source

## 2021-03-02 ENCOUNTER — Inpatient Hospital Stay (HOSPITAL_COMMUNITY)
Admission: EM | Admit: 2021-03-02 | Discharge: 2021-03-04 | DRG: 083 | Disposition: A | Discharge status: Home Health | Payer: No Typology Code available for payment source | Attending: General Surgery | Admitting: General Surgery

## 2021-03-02 ENCOUNTER — Encounter (HOSPITAL_COMMUNITY): Payer: Self-pay

## 2021-03-02 ENCOUNTER — Other Ambulatory Visit: Payer: Self-pay

## 2021-03-02 DIAGNOSIS — Z8546 Personal history of malignant neoplasm of prostate: Secondary | ICD-10-CM

## 2021-03-02 DIAGNOSIS — Y92410 Unspecified street and highway as the place of occurrence of the external cause: Secondary | ICD-10-CM

## 2021-03-02 DIAGNOSIS — Z888 Allergy status to other drugs, medicaments and biological substances status: Secondary | ICD-10-CM

## 2021-03-02 DIAGNOSIS — Z885 Allergy status to narcotic agent status: Secondary | ICD-10-CM

## 2021-03-02 DIAGNOSIS — Z7902 Long term (current) use of antithrombotics/antiplatelets: Secondary | ICD-10-CM

## 2021-03-02 DIAGNOSIS — I130 Hypertensive heart and chronic kidney disease with heart failure and stage 1 through stage 4 chronic kidney disease, or unspecified chronic kidney disease: Secondary | ICD-10-CM | POA: Diagnosis present

## 2021-03-02 DIAGNOSIS — K219 Gastro-esophageal reflux disease without esophagitis: Secondary | ICD-10-CM | POA: Diagnosis present

## 2021-03-02 DIAGNOSIS — E079 Disorder of thyroid, unspecified: Secondary | ICD-10-CM | POA: Diagnosis present

## 2021-03-02 DIAGNOSIS — S065XAA Traumatic subdural hemorrhage with loss of consciousness status unknown, initial encounter: Principal | ICD-10-CM | POA: Diagnosis present

## 2021-03-02 DIAGNOSIS — Z7901 Long term (current) use of anticoagulants: Secondary | ICD-10-CM

## 2021-03-02 DIAGNOSIS — I252 Old myocardial infarction: Secondary | ICD-10-CM

## 2021-03-02 DIAGNOSIS — I4891 Unspecified atrial fibrillation: Secondary | ICD-10-CM | POA: Diagnosis not present

## 2021-03-02 DIAGNOSIS — S42032A Displaced fracture of lateral end of left clavicle, initial encounter for closed fracture: Secondary | ICD-10-CM | POA: Diagnosis present

## 2021-03-02 DIAGNOSIS — E876 Hypokalemia: Secondary | ICD-10-CM | POA: Diagnosis present

## 2021-03-02 DIAGNOSIS — T1490XA Injury, unspecified, initial encounter: Secondary | ICD-10-CM

## 2021-03-02 DIAGNOSIS — E782 Mixed hyperlipidemia: Secondary | ICD-10-CM | POA: Diagnosis present

## 2021-03-02 DIAGNOSIS — N1831 Chronic kidney disease, stage 3a: Secondary | ICD-10-CM

## 2021-03-02 DIAGNOSIS — S42002A Fracture of unspecified part of left clavicle, initial encounter for closed fracture: Secondary | ICD-10-CM

## 2021-03-02 DIAGNOSIS — E039 Hypothyroidism, unspecified: Secondary | ICD-10-CM | POA: Diagnosis present

## 2021-03-02 DIAGNOSIS — Z8249 Family history of ischemic heart disease and other diseases of the circulatory system: Secondary | ICD-10-CM

## 2021-03-02 DIAGNOSIS — K5900 Constipation, unspecified: Secondary | ICD-10-CM | POA: Diagnosis present

## 2021-03-02 DIAGNOSIS — S0081XA Abrasion of other part of head, initial encounter: Secondary | ICD-10-CM | POA: Diagnosis present

## 2021-03-02 DIAGNOSIS — Z886 Allergy status to analgesic agent status: Secondary | ICD-10-CM

## 2021-03-02 DIAGNOSIS — I5032 Chronic diastolic (congestive) heart failure: Secondary | ICD-10-CM

## 2021-03-02 DIAGNOSIS — E119 Type 2 diabetes mellitus without complications: Secondary | ICD-10-CM

## 2021-03-02 DIAGNOSIS — N179 Acute kidney failure, unspecified: Secondary | ICD-10-CM | POA: Diagnosis present

## 2021-03-02 DIAGNOSIS — R413 Other amnesia: Secondary | ICD-10-CM | POA: Diagnosis present

## 2021-03-02 DIAGNOSIS — Z20822 Contact with and (suspected) exposure to covid-19: Secondary | ICD-10-CM | POA: Diagnosis present

## 2021-03-02 DIAGNOSIS — Z8673 Personal history of transient ischemic attack (TIA), and cerebral infarction without residual deficits: Secondary | ICD-10-CM

## 2021-03-02 DIAGNOSIS — M25512 Pain in left shoulder: Secondary | ICD-10-CM | POA: Diagnosis not present

## 2021-03-02 DIAGNOSIS — N184 Chronic kidney disease, stage 4 (severe): Secondary | ICD-10-CM | POA: Diagnosis present

## 2021-03-02 DIAGNOSIS — Z794 Long term (current) use of insulin: Secondary | ICD-10-CM

## 2021-03-02 DIAGNOSIS — N1832 Chronic kidney disease, stage 3b: Secondary | ICD-10-CM

## 2021-03-02 DIAGNOSIS — R40241 Glasgow coma scale score 13-15, unspecified time: Secondary | ICD-10-CM | POA: Diagnosis present

## 2021-03-02 DIAGNOSIS — N183 Chronic kidney disease, stage 3 unspecified: Secondary | ICD-10-CM | POA: Diagnosis present

## 2021-03-02 DIAGNOSIS — Z951 Presence of aortocoronary bypass graft: Secondary | ICD-10-CM

## 2021-03-02 DIAGNOSIS — I25118 Atherosclerotic heart disease of native coronary artery with other forms of angina pectoris: Secondary | ICD-10-CM | POA: Diagnosis present

## 2021-03-02 DIAGNOSIS — E1122 Type 2 diabetes mellitus with diabetic chronic kidney disease: Secondary | ICD-10-CM | POA: Diagnosis present

## 2021-03-02 DIAGNOSIS — Z8719 Personal history of other diseases of the digestive system: Secondary | ICD-10-CM

## 2021-03-02 DIAGNOSIS — Z882 Allergy status to sulfonamides status: Secondary | ICD-10-CM

## 2021-03-02 LAB — URINALYSIS, ROUTINE W REFLEX MICROSCOPIC
Bacteria, UA: NONE SEEN
Bilirubin Urine: NEGATIVE
Glucose, UA: 50 mg/dL — AB
Ketones, ur: 5 mg/dL — AB
Leukocytes,Ua: NEGATIVE
Nitrite: NEGATIVE
Protein, ur: 100 mg/dL — AB
Specific Gravity, Urine: 1.013 (ref 1.005–1.030)
pH: 5 (ref 5.0–8.0)

## 2021-03-02 LAB — I-STAT CHEM 8, ED
BUN: 34 mg/dL — ABNORMAL HIGH (ref 8–23)
Calcium, Ion: 1.14 mmol/L — ABNORMAL LOW (ref 1.15–1.40)
Chloride: 108 mmol/L (ref 98–111)
Creatinine, Ser: 2.7 mg/dL — ABNORMAL HIGH (ref 0.61–1.24)
Glucose, Bld: 142 mg/dL — ABNORMAL HIGH (ref 70–99)
HCT: 43 % (ref 39.0–52.0)
Hemoglobin: 14.6 g/dL (ref 13.0–17.0)
Potassium: 3.3 mmol/L — ABNORMAL LOW (ref 3.5–5.1)
Sodium: 140 mmol/L (ref 135–145)
TCO2: 20 mmol/L — ABNORMAL LOW (ref 22–32)

## 2021-03-02 LAB — PROTIME-INR
INR: 1.1 (ref 0.8–1.2)
Prothrombin Time: 14.2 seconds (ref 11.4–15.2)

## 2021-03-02 LAB — COMPREHENSIVE METABOLIC PANEL
ALT: 14 U/L (ref 0–44)
AST: 30 U/L (ref 15–41)
Albumin: 3.9 g/dL (ref 3.5–5.0)
Alkaline Phosphatase: 63 U/L (ref 38–126)
Anion gap: 10 (ref 5–15)
BUN: 33 mg/dL — ABNORMAL HIGH (ref 8–23)
CO2: 20 mmol/L — ABNORMAL LOW (ref 22–32)
Calcium: 9.1 mg/dL (ref 8.9–10.3)
Chloride: 107 mmol/L (ref 98–111)
Creatinine, Ser: 2.64 mg/dL — ABNORMAL HIGH (ref 0.61–1.24)
GFR, Estimated: 23 mL/min — ABNORMAL LOW (ref >60)
Glucose, Bld: 143 mg/dL — ABNORMAL HIGH (ref 70–99)
Potassium: 3.4 mmol/L — ABNORMAL LOW (ref 3.5–5.1)
Sodium: 137 mmol/L (ref 135–145)
Total Bilirubin: 1 mg/dL (ref 0.3–1.2)
Total Protein: 7 g/dL (ref 6.5–8.1)

## 2021-03-02 LAB — CBC
HCT: 43.2 % (ref 39.0–52.0)
Hemoglobin: 14.8 g/dL (ref 13.0–17.0)
MCH: 29.7 pg (ref 26.0–34.0)
MCHC: 34.3 g/dL (ref 30.0–36.0)
MCV: 86.6 fL (ref 80.0–100.0)
Platelets: 260 10*3/uL (ref 150–400)
RBC: 4.99 MIL/uL (ref 4.22–5.81)
RDW: 14.6 % (ref 11.5–15.5)
WBC: 9.7 10*3/uL (ref 4.0–10.5)
nRBC: 0 % (ref 0.0–0.2)

## 2021-03-02 LAB — GLUCOSE, CAPILLARY: Glucose-Capillary: 106 mg/dL — ABNORMAL HIGH (ref 70–99)

## 2021-03-02 LAB — RESP PANEL BY RT-PCR (FLU A&B, COVID) ARPGX2
Influenza A by PCR: NEGATIVE
Influenza B by PCR: NEGATIVE
SARS Coronavirus 2 by RT PCR: NEGATIVE

## 2021-03-02 LAB — LACTIC ACID, PLASMA: Lactic Acid, Venous: 1.4 mmol/L (ref 0.5–1.9)

## 2021-03-02 LAB — ETHANOL: Alcohol, Ethyl (B): <10 mg/dL (ref <10)

## 2021-03-02 LAB — SAMPLE TO BLOOD BANK

## 2021-03-02 LAB — MAGNESIUM: Magnesium: 2 mg/dL (ref 1.7–2.4)

## 2021-03-02 LAB — CBG MONITORING, ED: Glucose-Capillary: 108 mg/dL — ABNORMAL HIGH (ref 70–99)

## 2021-03-02 MED ORDER — EMPTY CONTAINERS FLEXIBLE MISC: 1800.0000 mg | Freq: Once | Status: DC

## 2021-03-02 MED ORDER — ONDANSETRON HCL 4 MG/2ML IJ SOLN: 4.0000 mg | Freq: Four times a day (QID) | INTRAMUSCULAR | Status: DC | PRN

## 2021-03-02 MED ORDER — DOCUSATE SODIUM 100 MG PO CAPS
100.0000 mg | ORAL_CAPSULE | Freq: Two times a day (BID) | ORAL | Status: DC
  Administered 2021-03-03 – 2021-03-04 (×2): 100 mg via ORAL
  Filled 2021-03-02 (×2): qty 1

## 2021-03-02 MED ORDER — HYDROMORPHONE HCL 1 MG/ML IJ SOLN: 0.5000 mg | INTRAMUSCULAR | Status: DC | PRN

## 2021-03-02 MED ORDER — AMLODIPINE BESYLATE 5 MG PO TABS
5.0000 mg | ORAL_TABLET | Freq: Two times a day (BID) | ORAL | Status: DC
  Administered 2021-03-03 – 2021-03-04 (×3): 5 mg via ORAL
  Filled 2021-03-02 (×4): qty 1

## 2021-03-02 MED ORDER — ACETAMINOPHEN 500 MG PO TABS
1000.0000 mg | ORAL_TABLET | Freq: Four times a day (QID) | ORAL | Status: DC | PRN
  Administered 2021-03-02 – 2021-03-04 (×4): 1000 mg via ORAL
  Filled 2021-03-02 (×4): qty 2

## 2021-03-02 MED ORDER — TRANEXAMIC ACID FOR EPISTAXIS
500.0000 mg | Freq: Once | TOPICAL | Status: AC
  Administered 2021-03-02: 500 mg via TOPICAL
  Filled 2021-03-02: qty 10

## 2021-03-02 MED ORDER — CHLORHEXIDINE GLUCONATE CLOTH 2 % EX PADS
6.0000 | MEDICATED_PAD | Freq: Every day | CUTANEOUS | Status: DC
  Administered 2021-03-03 – 2021-03-04 (×2): 6 via TOPICAL

## 2021-03-02 MED ORDER — LACTATED RINGERS IV SOLN: INTRAVENOUS | Status: DC

## 2021-03-02 MED ORDER — POTASSIUM CHLORIDE CRYS ER 20 MEQ PO TBCR
40.0000 meq | EXTENDED_RELEASE_TABLET | Freq: Once | ORAL | Status: AC
  Administered 2021-03-02: 40 meq via ORAL
  Filled 2021-03-02: qty 2

## 2021-03-02 MED ORDER — INSULIN ASPART 100 UNIT/ML IJ SOLN
0.0000 [IU] | Freq: Three times a day (TID) | INTRAMUSCULAR | Status: DC
  Administered 2021-03-03: 3 [IU] via SUBCUTANEOUS
  Administered 2021-03-03 – 2021-03-04 (×2): 2 [IU] via SUBCUTANEOUS

## 2021-03-02 MED ORDER — INSULIN GLARGINE-YFGN 100 UNIT/ML ~~LOC~~ SOLN
5.0000 [IU] | Freq: Two times a day (BID) | SUBCUTANEOUS | Status: DC
  Administered 2021-03-03 (×2): 5 [IU] via SUBCUTANEOUS
  Filled 2021-03-02 (×6): qty 0.05

## 2021-03-02 MED ORDER — CARBIDOPA-LEVODOPA 25-100 MG PO TABS
1.0000 | ORAL_TABLET | Freq: Three times a day (TID) | ORAL | Status: DC
  Administered 2021-03-02 – 2021-03-04 (×6): 1 via ORAL
  Filled 2021-03-02 (×6): qty 1

## 2021-03-02 MED ORDER — PANTOPRAZOLE SODIUM 40 MG PO TBEC
40.0000 mg | DELAYED_RELEASE_TABLET | Freq: Every day | ORAL | Status: DC
  Administered 2021-03-03 – 2021-03-04 (×2): 40 mg via ORAL
  Filled 2021-03-02 (×2): qty 1

## 2021-03-02 MED ORDER — EMPTY CONTAINERS FLEXIBLE MISC
900.0000 mg | Freq: Once | Status: AC
  Administered 2021-03-02: 900 mg via INTRAVENOUS
  Filled 2021-03-02: qty 90

## 2021-03-02 MED ORDER — ONDANSETRON 4 MG PO TBDP: 4.0000 mg | ORAL_TABLET | Freq: Four times a day (QID) | ORAL | Status: DC | PRN

## 2021-03-02 MED ORDER — SODIUM CHLORIDE 0.9 % IV SOLN: INTRAVENOUS | Status: DC

## 2021-03-02 MED ORDER — HYDRALAZINE HCL 20 MG/ML IJ SOLN
10.0000 mg | INTRAMUSCULAR | Status: DC | PRN
  Administered 2021-03-02 – 2021-03-03 (×2): 10 mg via INTRAVENOUS
  Filled 2021-03-02 (×2): qty 1

## 2021-03-02 MED ORDER — LEVOTHYROXINE SODIUM 75 MCG PO TABS
75.0000 ug | ORAL_TABLET | Freq: Every day | ORAL | Status: DC
Start: 2021-03-03 — End: 2021-03-04
  Administered 2021-03-03 – 2021-03-04 (×2): 75 ug via ORAL
  Filled 2021-03-02 (×2): qty 1

## 2021-03-02 MED ORDER — LEVETIRACETAM IN NACL 500 MG/100ML IV SOLN
500.0000 mg | Freq: Two times a day (BID) | INTRAVENOUS | Status: DC
  Administered 2021-03-02 – 2021-03-03 (×3): 500 mg via INTRAVENOUS
  Filled 2021-03-02 (×3): qty 100

## 2021-03-02 MED ORDER — ADULT MULTIVITAMIN W/MINERALS CH
1.0000 | ORAL_TABLET | Freq: Every day | ORAL | Status: DC
  Administered 2021-03-03 – 2021-03-04 (×2): 1 via ORAL
  Filled 2021-03-02 (×2): qty 1
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
## 2021-03-02 NOTE — ED Notes (Signed)
Xray at bedside with patient
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
## 2021-03-02 NOTE — Progress Notes (Signed)
Orthopedic Tech Progress Note Patient Details:  Victor Gould 01/05/1941 [id number]  Ortho Devices Type of Ortho Device: Shoulder immobilizer Ortho Device/Splint Location: LUE Ortho Device/Splint Interventions: Ordered, Application, Adjustment   Post Interventions Patient Tolerated: Well Instructions Provided: Adjustment of device, Care of device, Poper ambulation with device  [name] 03/02/2021, 8:28 PM

## 2021-03-02 NOTE — ED Notes (Addendum)
Ortho MD Nadeen Landau, at bedside discussing plan of care with patient.
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
## 2021-03-02 NOTE — ED Notes (Signed)
Ortho at bedside with patient applying sling at this time.
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
## 2021-03-03 ENCOUNTER — Inpatient Hospital Stay (HOSPITAL_COMMUNITY): Payer: No Typology Code available for payment source

## 2021-03-03 DIAGNOSIS — S065XAA Traumatic subdural hemorrhage with loss of consciousness status unknown, initial encounter: Secondary | ICD-10-CM | POA: Diagnosis not present

## 2021-03-03 LAB — CBC
HCT: 37.3 % — ABNORMAL LOW (ref 39.0–52.0)
Hemoglobin: 12.6 g/dL — ABNORMAL LOW (ref 13.0–17.0)
MCH: 29.5 pg (ref 26.0–34.0)
MCHC: 33.8 g/dL (ref 30.0–36.0)
MCV: 87.4 fL (ref 80.0–100.0)
Platelets: 207 10*3/uL (ref 150–400)
RBC: 4.27 MIL/uL (ref 4.22–5.81)
RDW: 14.9 % (ref 11.5–15.5)
WBC: 8.9 10*3/uL (ref 4.0–10.5)
nRBC: 0 % (ref 0.0–0.2)

## 2021-03-03 LAB — BASIC METABOLIC PANEL
Anion gap: 9 (ref 5–15)
BUN: 29 mg/dL — ABNORMAL HIGH (ref 8–23)
CO2: 20 mmol/L — ABNORMAL LOW (ref 22–32)
Calcium: 8.6 mg/dL — ABNORMAL LOW (ref 8.9–10.3)
Chloride: 110 mmol/L (ref 98–111)
Creatinine, Ser: 2.77 mg/dL — ABNORMAL HIGH (ref 0.61–1.24)
GFR, Estimated: 22 mL/min — ABNORMAL LOW (ref >60)
Glucose, Bld: 122 mg/dL — ABNORMAL HIGH (ref 70–99)
Potassium: 4 mmol/L (ref 3.5–5.1)
Sodium: 139 mmol/L (ref 135–145)

## 2021-03-03 LAB — MRSA NEXT GEN BY PCR, NASAL: MRSA by PCR Next Gen: NOT DETECTED

## 2021-03-03 LAB — GLUCOSE, CAPILLARY
Glucose-Capillary: 115 mg/dL — ABNORMAL HIGH (ref 70–99)
Glucose-Capillary: 211 mg/dL — ABNORMAL HIGH (ref 70–99)
Glucose-Capillary: 167 mg/dL — ABNORMAL HIGH (ref 70–99)
Glucose-Capillary: 254 mg/dL — ABNORMAL HIGH (ref 70–99)

## 2021-03-03 LAB — APTT: aPTT: 34 seconds (ref 24–36)

## 2021-03-03 LAB — PROTIME-INR
INR: 1.2 (ref 0.8–1.2)
Prothrombin Time: 15.5 seconds — ABNORMAL HIGH (ref 11.4–15.2)
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
## 2021-03-03 NOTE — Progress Notes (Signed)
Patient ID: Victor Gould, male   DOB: Mar 30, 1941, 80 y.o.   MRN: [id number] BP (!) 172/88   Pulse 84   Temp 97.8 F (36.6 C) (Axillary)   Resp 16   Ht 5\' 10"  (1.778 m)   Wt 82.6 kg   SpO2 95%   BMI 26.13 kg/m  Repeat CT shows mild change. Right to left shift is stable.  Moving all extremities well Ok for discharge tomorrow.
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
## 2021-03-03 NOTE — Discharge Instructions (Addendum)
Per Ortho - Sling for comfort for the first 2 weeks.  They would like to see you back in 2 weeks for x-rays. No lifting over 2 pounds at this time.    DO NOT RESTART PLAVIX AND ELIQUIS UNTIL CLEARED TO DO SO BY NEUROSURGERY (DR. CABBELL)
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
## 2021-03-03 NOTE — Progress Notes (Signed)
Carbidopa-levodopa in broken drawer in 4N ICU unit pyxis. Pharmacy aware. Requested for doses to be sent to the unit from pharmacy. Will give dose as soon as possible.   Montez Hageman, RN

## 2021-03-04 DIAGNOSIS — E079 Disorder of thyroid, unspecified: Secondary | ICD-10-CM | POA: Diagnosis present

## 2021-03-04 DIAGNOSIS — K219 Gastro-esophageal reflux disease without esophagitis: Secondary | ICD-10-CM | POA: Diagnosis present

## 2021-03-04 DIAGNOSIS — Z8249 Family history of ischemic heart disease and other diseases of the circulatory system: Secondary | ICD-10-CM | POA: Diagnosis not present

## 2021-03-04 DIAGNOSIS — Z7901 Long term (current) use of anticoagulants: Secondary | ICD-10-CM | POA: Diagnosis not present

## 2021-03-04 DIAGNOSIS — E1122 Type 2 diabetes mellitus with diabetic chronic kidney disease: Secondary | ICD-10-CM | POA: Diagnosis present

## 2021-03-04 DIAGNOSIS — I252 Old myocardial infarction: Secondary | ICD-10-CM | POA: Diagnosis not present

## 2021-03-04 DIAGNOSIS — E039 Hypothyroidism, unspecified: Secondary | ICD-10-CM | POA: Diagnosis present

## 2021-03-04 DIAGNOSIS — N184 Chronic kidney disease, stage 4 (severe): Secondary | ICD-10-CM | POA: Diagnosis present

## 2021-03-04 DIAGNOSIS — Z8546 Personal history of malignant neoplasm of prostate: Secondary | ICD-10-CM | POA: Diagnosis not present

## 2021-03-04 DIAGNOSIS — I4891 Unspecified atrial fibrillation: Secondary | ICD-10-CM | POA: Diagnosis present

## 2021-03-04 DIAGNOSIS — I5032 Chronic diastolic (congestive) heart failure: Secondary | ICD-10-CM | POA: Diagnosis present

## 2021-03-04 DIAGNOSIS — Z951 Presence of aortocoronary bypass graft: Secondary | ICD-10-CM | POA: Diagnosis not present

## 2021-03-04 DIAGNOSIS — Z20822 Contact with and (suspected) exposure to covid-19: Secondary | ICD-10-CM | POA: Diagnosis present

## 2021-03-04 DIAGNOSIS — I130 Hypertensive heart and chronic kidney disease with heart failure and stage 1 through stage 4 chronic kidney disease, or unspecified chronic kidney disease: Secondary | ICD-10-CM | POA: Diagnosis present

## 2021-03-04 DIAGNOSIS — N179 Acute kidney failure, unspecified: Secondary | ICD-10-CM | POA: Diagnosis present

## 2021-03-04 DIAGNOSIS — S0081XA Abrasion of other part of head, initial encounter: Secondary | ICD-10-CM | POA: Diagnosis present

## 2021-03-04 DIAGNOSIS — Z7902 Long term (current) use of antithrombotics/antiplatelets: Secondary | ICD-10-CM | POA: Diagnosis not present

## 2021-03-04 DIAGNOSIS — S065XAA Traumatic subdural hemorrhage with loss of consciousness status unknown, initial encounter: Secondary | ICD-10-CM | POA: Diagnosis present

## 2021-03-04 DIAGNOSIS — M25512 Pain in left shoulder: Secondary | ICD-10-CM | POA: Diagnosis present

## 2021-03-04 DIAGNOSIS — Z8673 Personal history of transient ischemic attack (TIA), and cerebral infarction without residual deficits: Secondary | ICD-10-CM | POA: Diagnosis not present

## 2021-03-04 DIAGNOSIS — I25118 Atherosclerotic heart disease of native coronary artery with other forms of angina pectoris: Secondary | ICD-10-CM | POA: Diagnosis present

## 2021-03-04 DIAGNOSIS — Z8719 Personal history of other diseases of the digestive system: Secondary | ICD-10-CM | POA: Diagnosis not present

## 2021-03-04 DIAGNOSIS — E782 Mixed hyperlipidemia: Secondary | ICD-10-CM | POA: Diagnosis present

## 2021-03-04 DIAGNOSIS — Y92410 Unspecified street and highway as the place of occurrence of the external cause: Secondary | ICD-10-CM | POA: Diagnosis not present

## 2021-03-04 DIAGNOSIS — Z794 Long term (current) use of insulin: Secondary | ICD-10-CM | POA: Diagnosis not present

## 2021-03-04 DIAGNOSIS — S42032A Displaced fracture of lateral end of left clavicle, initial encounter for closed fracture: Secondary | ICD-10-CM | POA: Diagnosis present

## 2021-03-04 LAB — CBC WITH DIFFERENTIAL/PLATELET
Abs Immature Granulocytes: 0.04 10*3/uL (ref 0.00–0.07)
Basophils Absolute: 0 10*3/uL (ref 0.0–0.1)
Basophils Relative: 0 %
Eosinophils Absolute: 0.1 10*3/uL (ref 0.0–0.5)
Eosinophils Relative: 1 %
HCT: 37 % — ABNORMAL LOW (ref 39.0–52.0)
Hemoglobin: 12.5 g/dL — ABNORMAL LOW (ref 13.0–17.0)
Immature Granulocytes: 0 %
Lymphocytes Relative: 12 %
Lymphs Abs: 1.2 10*3/uL (ref 0.7–4.0)
MCH: 29.4 pg (ref 26.0–34.0)
MCHC: 33.8 g/dL (ref 30.0–36.0)
MCV: 87.1 fL (ref 80.0–100.0)
Monocytes Absolute: 0.8 10*3/uL (ref 0.1–1.0)
Monocytes Relative: 8 %
Neutro Abs: 7.8 10*3/uL — ABNORMAL HIGH (ref 1.7–7.7)
Neutrophils Relative %: 79 %
Platelets: 228 10*3/uL (ref 150–400)
RBC: 4.25 MIL/uL (ref 4.22–5.81)
RDW: 15 % (ref 11.5–15.5)
WBC: 10 10*3/uL (ref 4.0–10.5)
nRBC: 0 % (ref 0.0–0.2)

## 2021-03-04 LAB — BASIC METABOLIC PANEL
Anion gap: 7 (ref 5–15)
BUN: 31 mg/dL — ABNORMAL HIGH (ref 8–23)
CO2: 19 mmol/L — ABNORMAL LOW (ref 22–32)
Calcium: 8.5 mg/dL — ABNORMAL LOW (ref 8.9–10.3)
Chloride: 107 mmol/L (ref 98–111)
Creatinine, Ser: 2.88 mg/dL — ABNORMAL HIGH (ref 0.61–1.24)
GFR, Estimated: 21 mL/min — ABNORMAL LOW (ref >60)
Glucose, Bld: 233 mg/dL — ABNORMAL HIGH (ref 70–99)
Potassium: 3.7 mmol/L (ref 3.5–5.1)
Sodium: 133 mmol/L — ABNORMAL LOW (ref 135–145)

## 2021-03-04 LAB — GLUCOSE, CAPILLARY
Glucose-Capillary: 172 mg/dL — ABNORMAL HIGH (ref 70–99)
Glucose-Capillary: 160 mg/dL — ABNORMAL HIGH (ref 70–99)

## 2021-03-04 MED ORDER — LEVETIRACETAM 500 MG PO TABS
500.0000 mg | ORAL_TABLET | Freq: Two times a day (BID) | ORAL | Status: DC
  Administered 2021-03-04: 500 mg via ORAL
  Filled 2021-03-04: qty 1

## 2021-03-04 MED ORDER — INSULIN GLARGINE-YFGN 100 UNIT/ML ~~LOC~~ SOLN
10.0000 [IU] | Freq: Two times a day (BID) | SUBCUTANEOUS | Status: DC
  Administered 2021-03-04: 10 [IU] via SUBCUTANEOUS
  Filled 2021-03-04 (×2): qty 0.1

## 2021-03-04 MED ORDER — LEVETIRACETAM 500 MG PO TABS: 500.0000 mg | ORAL_TABLET | Freq: Two times a day (BID) | ORAL | 0 refills | Status: DC
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
## 2021-03-04 NOTE — Progress Notes (Signed)
Patient doing well.  No headache.  No neurologic symptoms.  Mobilizing without difficulty.  He is awake and alert.  He is oriented and appropriate.  Motor and sensory function are intact.  His speech is fluent.  Patient with right convexity small subdural hematoma.  Okay for discharge home.  Patient should remain off anticoagulation for at least 2 weeks.  He will follow-up with Dr. Christella Noa and decision will be made at that time with regard to possible restarting anticoagulation.
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
## 2021-03-22 ENCOUNTER — Other Ambulatory Visit: Payer: Self-pay | Admitting: Internal Medicine

## 2021-03-22 DIAGNOSIS — S065XAA Traumatic subdural hemorrhage with loss of consciousness status unknown, initial encounter: Secondary | ICD-10-CM

## 2021-03-28 ENCOUNTER — Other Ambulatory Visit: Payer: Self-pay

## 2021-03-28 ENCOUNTER — Ambulatory Visit
Admission: RE | Admit: 2021-03-28 | Discharge: 2021-03-28 | Disposition: A | Discharge status: Home / Self Care | Payer: Medicare Other | Source: Ambulatory Visit | Attending: Internal Medicine | Admitting: Internal Medicine

## 2021-03-28 DIAGNOSIS — S065XAA Traumatic subdural hemorrhage with loss of consciousness status unknown, initial encounter: Secondary | ICD-10-CM | POA: Insufficient documentation

## 2021-04-04 ENCOUNTER — Other Ambulatory Visit: Payer: Self-pay | Admitting: Neurosurgery

## 2021-04-04 DIAGNOSIS — S065XAA Traumatic subdural hemorrhage with loss of consciousness status unknown, initial encounter: Secondary | ICD-10-CM

## 2021-04-25 ENCOUNTER — Ambulatory Visit
Admission: RE | Admit: 2021-04-25 | Discharge: 2021-04-25 | Disposition: A | Discharge status: Home / Self Care | Payer: Medicare Other | Source: Ambulatory Visit | Attending: Neurosurgery | Admitting: Neurosurgery

## 2021-04-25 ENCOUNTER — Other Ambulatory Visit: Payer: Self-pay

## 2021-04-25 DIAGNOSIS — S065XAA Traumatic subdural hemorrhage with loss of consciousness status unknown, initial encounter: Secondary | ICD-10-CM | POA: Diagnosis present

## 2021-06-15 ENCOUNTER — Ambulatory Visit: Payer: Medicare Other | Attending: Neurology

## 2021-06-15 ENCOUNTER — Other Ambulatory Visit: Payer: Self-pay

## 2021-06-15 VITALS — BP 161/92 | HR 76 | Ht 68.0 in | Wt 169.0 lb

## 2021-06-15 DIAGNOSIS — R2681 Unsteadiness on feet: Secondary | ICD-10-CM | POA: Diagnosis present

## 2021-06-15 DIAGNOSIS — R262 Difficulty in walking, not elsewhere classified: Secondary | ICD-10-CM | POA: Diagnosis present

## 2021-06-15 DIAGNOSIS — R269 Unspecified abnormalities of gait and mobility: Secondary | ICD-10-CM | POA: Diagnosis not present

## 2021-06-15 DIAGNOSIS — M6281 Muscle weakness (generalized): Secondary | ICD-10-CM | POA: Diagnosis present
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
## 2021-06-29 ENCOUNTER — Ambulatory Visit: Payer: Medicare Other | Attending: Neurology

## 2021-06-29 ENCOUNTER — Other Ambulatory Visit: Payer: Self-pay

## 2021-06-29 DIAGNOSIS — R2681 Unsteadiness on feet: Secondary | ICD-10-CM | POA: Diagnosis present

## 2021-06-29 DIAGNOSIS — M6281 Muscle weakness (generalized): Secondary | ICD-10-CM | POA: Insufficient documentation

## 2021-06-29 DIAGNOSIS — R262 Difficulty in walking, not elsewhere classified: Secondary | ICD-10-CM | POA: Insufficient documentation

## 2021-06-29 DIAGNOSIS — R269 Unspecified abnormalities of gait and mobility: Secondary | ICD-10-CM | POA: Diagnosis not present

## 2021-06-29 DIAGNOSIS — R278 Other lack of coordination: Secondary | ICD-10-CM | POA: Insufficient documentation
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
## 2021-07-06 ENCOUNTER — Ambulatory Visit: Payer: Medicare Other

## 2021-07-06 ENCOUNTER — Other Ambulatory Visit: Payer: Self-pay

## 2021-07-06 DIAGNOSIS — R2681 Unsteadiness on feet: Secondary | ICD-10-CM

## 2021-07-06 DIAGNOSIS — R262 Difficulty in walking, not elsewhere classified: Secondary | ICD-10-CM

## 2021-07-06 DIAGNOSIS — R269 Unspecified abnormalities of gait and mobility: Secondary | ICD-10-CM

## 2021-07-06 DIAGNOSIS — R278 Other lack of coordination: Secondary | ICD-10-CM
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
## 2021-07-11 ENCOUNTER — Ambulatory Visit: Payer: Medicare Other

## 2021-07-11 ENCOUNTER — Other Ambulatory Visit: Payer: Self-pay

## 2021-07-11 DIAGNOSIS — R262 Difficulty in walking, not elsewhere classified: Secondary | ICD-10-CM

## 2021-07-11 DIAGNOSIS — R278 Other lack of coordination: Secondary | ICD-10-CM

## 2021-07-11 DIAGNOSIS — R2681 Unsteadiness on feet: Secondary | ICD-10-CM

## 2021-07-11 DIAGNOSIS — R269 Unspecified abnormalities of gait and mobility: Secondary | ICD-10-CM | POA: Diagnosis not present
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
## 2021-07-13 ENCOUNTER — Ambulatory Visit: Payer: Medicare Other | Admitting: Physical Therapy

## 2021-07-13 ENCOUNTER — Other Ambulatory Visit: Payer: Self-pay

## 2021-07-13 ENCOUNTER — Encounter: Payer: Self-pay | Admitting: Physical Therapy

## 2021-07-13 DIAGNOSIS — R269 Unspecified abnormalities of gait and mobility: Secondary | ICD-10-CM

## 2021-07-13 DIAGNOSIS — R278 Other lack of coordination: Secondary | ICD-10-CM

## 2021-07-13 DIAGNOSIS — R262 Difficulty in walking, not elsewhere classified: Secondary | ICD-10-CM

## 2021-07-13 DIAGNOSIS — R2681 Unsteadiness on feet: Secondary | ICD-10-CM

## 2021-07-13 DIAGNOSIS — M6281 Muscle weakness (generalized): Secondary | ICD-10-CM

## 2021-07-13 NOTE — Therapy (Signed)
[facility] [place] [facility] MAIN REHAB SERVICES ?[place], [place], [address] ?Phone: [phone]   Fax:  [phone] ? ?Physical Therapy Treatment ? ?Patient Details  ?Name: Victor Gould ?MRN: [id number] ?Date of Birth: 1940-08-23 ?Referring Provider (PT): Dr. Manuella Ghazi ? ? ?Encounter Date: 07/13/2021 ? ? PT End of Session - 07/13/21 1355   ? ? Visit Number 5   ? Number of Visits 25   ? Date for PT Re-Evaluation 09/07/21   ? Authorization Time Period Initial cert 1/82/9937- 1/69/6789   ? Progress Note Due on Visit 10   ? PT Start Time 1350   ? PT Stop Time 1430   ? PT Time Calculation (min) 40 min   ? Equipment Utilized During Treatment Gait belt   ? Activity Tolerance Patient tolerated treatment well;No increased pain   ? Behavior During Therapy [facility] for tasks assessed/performed   ? ?  ?  ? ?  ? ? ?Past Medical History:  ?Diagnosis Date  ? ASCVD (arteriosclerotic cardiovascular disease)   ? Cancer [facility])   ? prostate  ? Chronic kidney disease   ? Colon polyps   ? Constipation   ? Coronary artery disease   ? Diabetes mellitus   ? Family history of adverse reaction to anesthesia   ? children - PONV  ? GERD (gastroesophageal reflux disease)   ? Hemorrhoids   ? Hyperlipidemia   ? Hypertension   ? PCP [name] at [place]  ? Hypothyroidism   ? MI (myocardial infarction) ([place])   ? PONV (postoperative nausea and vomiting)   ? Thyroid disease   ? Vertigo   ? positional  ? ? ?Past Surgical History:  ?Procedure Laterality Date  ? APPENDECTOMY    ? CATARACT EXTRACTION W/PHACO Right 10/27/2018  ? Procedure: CATARACT EXTRACTION PHACO AND INTRAOCULAR LENS PLACEMENT (IOC) RIGHT, DIABETIC;  Surgeon: [name], MD;  Location: [facility];  Service: Ophthalmology;  Laterality: Right;  diabetic - insulin  ? CATARACT EXTRACTION W/PHACO Left 11/25/2018  ? Procedure: CATARACT EXTRACTION PHACO AND INTRAOCULAR LENS PLACEMENT ([place]) LEFT DIABETIC;  Surgeon: [name], MD;  Location:  [place] [facility] [place];  Service: Ophthalmology;  Laterality: Left;  ? COLONOSCOPY    ? colonoscopy with polypectomy    ? COLONOSCOPY WITH PROPOFOL N/A 12/02/2017  ? Procedure: COLONOSCOPY WITH PROPOFOL;  Surgeon: [name], MD;  Location: [facility] ENDOSCOPY;  Service: Endoscopy;  Laterality: N/A;  ? CORONARY ARTERY BYPASS GRAFT N/A 08/28/2016  ? Procedure: CORONARY ARTERY BYPASS GRAFTING (CABG) x five, using left internal mammary artery and right leg greater saphenous vein harvested endoscopically - -LIMA to LAD, -SVG to OM, -SVG to DIAGONAL, -SEQ SVG to PDA, PLVB ;  Surgeon: [name], MD;  Location: [place];  Service: Open Heart Surgery;  Laterality: N/A;  ? CYSTOSCOPY N/A 09/29/2013  ? Procedure: CYSTOSCOPY;  Surgeon: [name], MD;  Location: [facility];  Service: Urology;  Laterality: N/A;  ? ESOPHAGOGASTRODUODENOSCOPY (EGD) WITH PROPOFOL N/A 12/02/2017  ? Procedure: ESOPHAGOGASTRODUODENOSCOPY (EGD) WITH PROPOFOL;  Surgeon: [name], MD;  Location: [facility] ENDOSCOPY;  Service: Endoscopy;  Laterality: N/A;  ? HERNIA REPAIR    ? inguinal bilaterally  ? LEFT HEART CATH AND CORONARY ANGIOGRAPHY N/A 08/27/2016  ? Procedure: Left Heart Cath and Coronary Angiography;  Surgeon: [name], MD;  Location: [place] CV LAB;  Service: Cardiovascular;  Laterality: N/A;  ? ROBOT ASSISTED LAPAROSCOPIC RADICAL PROSTATECTOMY  06/25/2011  ? Procedure: ROBOTIC  ASSISTED LAPAROSCOPIC RADICAL PROSTATECTOMY LEVEL 2;  Surgeon: [name], MD;  Location: [facility];  Service: Urology;  Laterality: N/A;  ? TEE WITHOUT CARDIOVERSION N/A 08/28/2016  ? Procedure: TRANSESOPHAGEAL ECHOCARDIOGRAM (TEE);  Surgeon: [name], MD;  Location: [place];  Service: Open Heart Surgery;  Laterality: N/A;  ? UPPER GASTROINTESTINAL ENDOSCOPY    ? URETHRAL SLING N/A 09/29/2013  ? Procedure: MALE SLING;  Surgeon: [name], MD;  Location: [facility];  Service: Urology;  Laterality: N/A;  ? ? ?There were no vitals filed for this visit. ? ?  Subjective Assessment - 07/13/21 1354   ? ? Subjective Patient reports doing exercises yesterday really well. He has stopped the sleeping pills which has improved his alertness and tolerance with exercise. Denies any pain; Denies any stumbles/falls;   ? Pertinent History Patient is a 81 year old male with diagnosis of Parkinsons referred to outpatient PT for difficulty with balance  with recent history of falling. He reports 3 falls in past 6 months. He states he does not use any assistive device and noticing more shuffling nature of walkng. 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   ? Limitations Lifting;Standing;Walking;House hold activities   ? How long can you sit comfortably? no issues   ? How long can you stand comfortably? <1 hour   ? How long can you walk comfortably? <30 min   ? Patient Stated Goals My main goal is to improve my balance so I don't fall.   ? Currently in Pain? No/denies   ? Pain Onset More than a month ago   ? ?  ?  ? ?  ? ? ? ?INTERVENTIONS:  ?  ?Standing in parallel bars: ?Standing on airex pad: ?Feet apart: ?Unsupported standing 30 sec hold, mild unsteadiness ?Heel/toe raises x10 reps unsupported , CGA for safety ?Dynamic high knee marching x 10 reps each leg unsupported with cues for increased ROM and to slow down LE movement for better balance control, min A for safety;  ? ?Forward/backward step airex to airex over orange hurdle x10 reps with 2-1 rail assist ?Side step over orange hurdle airex to airex x10 reps; ?Patient required CGA for safety and cues to increase step length for better foot clearance;  ? ?Feet apart one foot on airex to airex side/side weight shift reaching to post-it x10 reps reaching outside base of support; good weight shift;  ? ?Standing on BOSU: ?-heel/toe rock ?-alternate march ?-unsupported standing 30 sec hold, required min A with mild unsteadiness noted with decreased ankle strategies;  ?Progressed to step up with contralateral  LE hip flexion march against green tband x12 reps each LE, patient exhibits increased weakness of RLE with decreased hip extension during LLE hip flexion march, requiring min A for safety;  ?  ?Resistive gait Forward/backward x 3 each, side stepping x3 reps at 12.5 lb each - required CGA for safety, except min A with eccentric return during side stepping for balance control;  ?  ?Education provided throughout session via VC/TC and demonstration to facilitate movement at target joints and correct muscle activation for all testing and exercises performed.  ?  ?He tolerated session well.  ?  ? ? ? ? ? ? ? ? ? ? ? ? ? ? ? ? ? ? ? ? ? PT Education - 07/13/21 1355   ? ? Education Details exercise technique/balance/strengthening;   ? Person(s) Educated Patient   ? Methods Explanation;Verbal cues   ?  Comprehension Verbalized understanding;Returned demonstration;Verbal cues required;Need further instruction   ? ?  ?  ? ?  ? ? ? PT Short Term Goals - 06/16/21 1149   ? ?  ? PT SHORT TERM GOAL #1  ? Title Pt will be independent with initial  HEP in order to improve strength and balance in order to decrease fall risk and improve function at home and work.   ? Baseline 06/15/2021- No formal HEP in place   ? Time 6   ? Period Weeks   ? Status New   ? Target Date 07/27/21   ? ?  ?  ? ?  ? ? ? ? PT Long Term Goals - 06/16/21 1150   ? ?  ? PT LONG TERM GOAL #1  ? Title Pt will be independent with Final HEP in order to improve strength and balance in order to decrease fall risk and improve function at home and work.   ? Baseline 2/16= No formal HEP in place   ? Time 12   ? Period Weeks   ? Status New   ? Target Date 09/07/21   ?  ? PT LONG TERM GOAL #2  ? Title Pt will improve FOTO to target score of 64 to display perceived improvements in ability to complete ADL's.   ? Baseline 06/15/2021= 57   ? Time 12   ? Period Weeks   ? Status New   ? Target Date 09/07/21   ?  ? PT LONG TERM GOAL #3  ? Title Pt will improve BERG by at least 3  points in order to demonstrate clinically significant improvement in balance.   ? Baseline 06/15/2021= 48/56   ? Time 12   ? Period Weeks   ? Status New   ? Target Date 09/07/21   ?  ? PT LONG TERM GOAL #

## 2021-07-17 ENCOUNTER — Ambulatory Visit: Payer: Medicare Other | Admitting: Physical Therapy

## 2021-07-17 ENCOUNTER — Encounter: Payer: Self-pay | Admitting: Physical Therapy

## 2021-07-17 ENCOUNTER — Other Ambulatory Visit: Payer: Self-pay

## 2021-07-17 DIAGNOSIS — R269 Unspecified abnormalities of gait and mobility: Secondary | ICD-10-CM

## 2021-07-17 DIAGNOSIS — R2681 Unsteadiness on feet: Secondary | ICD-10-CM

## 2021-07-17 DIAGNOSIS — R262 Difficulty in walking, not elsewhere classified: Secondary | ICD-10-CM

## 2021-07-17 DIAGNOSIS — M6281 Muscle weakness (generalized): Secondary | ICD-10-CM

## 2021-07-17 NOTE — Therapy (Signed)
[place] [facility] ?[place], [place], [address] ?Phone: [phone]   Fax:  [phone] ? ?Physical Therapy Treatment ? ?Patient Details  ?Name: Victor Gould ?MRN: [id number] ?Date of Birth: 08-14-1940 ?Referring Provider (PT): Dr. Manuella Ghazi ? ? ?Encounter Date: 07/17/2021 ? ? PT End of Session - 07/17/21 1523   ? ? Visit Number 6   ? Number of Visits 25   ? Date for PT Re-Evaluation 09/07/21   ? Authorization Time Period Initial cert 06/09/4707- 10/25/3660   ? Progress Note Due on Visit 10   ? PT Start Time 1300   ? PT Stop Time 9476   ? PT Time Calculation (min) 43 min   ? Equipment Utilized During Treatment Gait belt   ? Activity Tolerance Patient tolerated treatment well;No increased pain   ? Behavior During Therapy [facility] for tasks assessed/performed   ? ?  ?  ? ?  ? ? ?Past Medical History:  ?Diagnosis Date  ? ASCVD (arteriosclerotic cardiovascular disease)   ? Cancer [facility])   ? prostate  ? Chronic kidney disease   ? Colon polyps   ? Constipation   ? Coronary artery disease   ? Diabetes mellitus   ? Family history of adverse reaction to anesthesia   ? children - PONV  ? GERD (gastroesophageal reflux disease)   ? Hemorrhoids   ? Hyperlipidemia   ? Hypertension   ? PCP [name] at [place]  ? Hypothyroidism   ? MI (myocardial infarction) ([place])   ? PONV (postoperative nausea and vomiting)   ? Thyroid disease   ? Vertigo   ? positional  ? ? ?Past Surgical History:  ?Procedure Laterality Date  ? APPENDECTOMY    ? CATARACT EXTRACTION W/PHACO Right 10/27/2018  ? Procedure: CATARACT EXTRACTION PHACO AND INTRAOCULAR LENS PLACEMENT (IOC) RIGHT, DIABETIC;  Surgeon: [name], MD;  Location: [place];  Service: Ophthalmology;  Laterality: Right;  diabetic - insulin  ? CATARACT EXTRACTION W/PHACO Left 11/25/2018  ? Procedure: CATARACT EXTRACTION PHACO AND INTRAOCULAR LENS PLACEMENT ([place]) LEFT DIABETIC;  Surgeon: [name], MD;  Location:  [place];  Service: Ophthalmology;  Laterality: Left;  ? COLONOSCOPY    ? colonoscopy with polypectomy    ? COLONOSCOPY WITH PROPOFOL N/A 12/02/2017  ? Procedure: COLONOSCOPY WITH PROPOFOL;  Surgeon: [name], MD;  Location: [facility];  Service: Endoscopy;  Laterality: N/A;  ? CORONARY ARTERY BYPASS GRAFT N/A 08/28/2016  ? Procedure: CORONARY ARTERY BYPASS GRAFTING (CABG) x five, using left internal mammary artery and right leg greater saphenous vein harvested endoscopically - -LIMA to LAD, -SVG to OM, -SVG to DIAGONAL, -SEQ SVG to PDA, PLVB ;  Surgeon: [name], MD;  Location: [place];  Service: Open Heart Surgery;  Laterality: N/A;  ? CYSTOSCOPY N/A 09/29/2013  ? Procedure: CYSTOSCOPY;  Surgeon: [name], MD;  [facility]: [facility];  Service: Urology;  Laterality: N/A;  ? ESOPHAGOGASTRODUODENOSCOPY (EGD) WITH PROPOFOL N/A 12/02/2017  ? Procedure: ESOPHAGOGASTRODUODENOSCOPY (EGD) WITH PROPOFOL;  Surgeon: [name], MD;  Location: [facility];  Service: Endoscopy;  Laterality: N/A;  ? HERNIA REPAIR    ? inguinal bilaterally  ? LEFT HEART CATH AND CORONARY ANGIOGRAPHY N/A 08/27/2016  ? Procedure: Left Heart Cath and Coronary Angiography;  Surgeon: [name], MD;  Location: [facility];  Service: Cardiovascular;  Laterality: N/A;  ? ROBOT ASSISTED LAPAROSCOPIC RADICAL PROSTATECTOMY  06/25/2011  ? Procedure: ROBOTIC  ASSISTED LAPAROSCOPIC RADICAL PROSTATECTOMY LEVEL 2;  Surgeon: [name], MD;  Location: [facility];  Service: Urology;  Laterality: N/A;  ? TEE WITHOUT CARDIOVERSION N/A 08/28/2016  ? Procedure: TRANSESOPHAGEAL ECHOCARDIOGRAM (TEE);  Surgeon: [name], MD;  Location: [place];  Service: Open Heart Surgery;  Laterality: N/A;  ? UPPER GASTROINTESTINAL ENDOSCOPY    ? URETHRAL SLING N/A 09/29/2013  ? Procedure: MALE SLING;  Surgeon: [name], MD;  Location: [facility];  Service: Urology;  Laterality: N/A;  ? ? ?There were no vitals filed for this visit. ? ?  Subjective Assessment - 07/17/21 1529   ? ? Subjective Patient reports doing exercises regularly. Pt has not had any falls or LOb since last visit. Pt does report some shuffling and difficulty standing following prolonged sitting.   ? Pertinent History Patient is a 81 year old male with diagnosis of Parkinsons referred to outpatient PT for difficulty with balance  with recent history of falling. He reports 3 falls in past 6 months. He states he does not use any assistive device and noticing more shuffling nature of walkng. 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   ? Limitations Lifting;Standing;Walking;House hold activities   ? How long can you sit comfortably? no issues   ? How long can you stand comfortably? <1 hour   ? How long can you walk comfortably? <30 min   ? Patient Stated Goals My main goal is to improve my balance so I don't fall.   ? Pain Onset More than a month ago   ? ?  ?  ? ?  ? ?INTERVENTIONS:  ?  ?Standing in parallel bars: ?Standing on airex pad: ?Feet apart: ?Unsupported standing 30 sec hold, mild unsteadiness ?Heel/toe raises x10 reps unsupported , CGA for safety ?Dynamic high knee marching x 10 reps each leg unsupported with cues for increased ROM and to slow down LE movement for better balance control, min A for safety; - 6 RPE  ? ?Ladder with 2 x airex pads at ends times several repetitions without upper extremity assist.  Patient had difficulty with stepping into each ladder rung fully as his heel intermittently made contact with rung of ladder.  Continue to incorporate interventions to improve step length for better awareness of stepping pattern for patient. ? ?Basic 4 standing PWR! Exercises ( handout provided) x 10 ea  ?Cues for proper performance and technique.  Difficulty with taking large steps with power step. ? ?Resistive gait Forward/backward  x 3 each, side stepping x3 reps at 12.5 lb each - required CGA for safety, except min A with eccentric  return during side stepping for balance control ?  ?Education provided throughout session via VC/TC and demonstration to facilitate movement at target joints and correct muscle activation for all testing and exercises performed.  ? Note: Portions of this document were prepared using Dragon voice recognition software and although reviewed may contain unintentional dictation errors in syntax, grammar, or spelling. ? ?  ? ? ? ? ? ? ? ? ? ? ? ? ? ? ? ? ? ? ? ? ? ? ? ? ? ? ? ? ? ? PT Education - 07/17/21 1530   ? ? Education Details Exercise technique, basic 4 PWR!   ? Person(s) Educated Patient   ? Methods Explanation;Demonstration   ? Comprehension Verbalized understanding;Returned demonstration   ? ?  ?  ? ?  ? ? ? PT Short Term Goals -  06/16/21 1149   ? ?  ? PT SHORT TERM GOAL #1  ? Title Pt will be independent with initial  HEP in order to improve strength and balance in order to decrease fall risk and improve function at home and work.   ? Baseline 06/15/2021- No formal HEP in place   ? Time 6   ? Period Weeks   ? Status New   ? Target Date 07/27/21   ? ?  ?  ? ?  ? ? ? ? PT Long Term Goals - 06/16/21 1150   ? ?  ? PT LONG TERM GOAL #1  ? Title Pt will be independent with Final HEP in order to improve strength and balance in order to decrease fall risk and improve function at home and work.   ? Baseline 2/16= No formal HEP in place   ? Time 12   ? Period Weeks   ? Status New   ? Target Date 09/07/21   ?  ? PT LONG TERM GOAL #2  ? Title Pt will improve FOTO to target score of 64 to display perceived improvements in ability to complete ADL's.   ? Baseline 06/15/2021= 57   ? Time 12   ? Period Weeks   ? Status New   ? Target Date 09/07/21   ?  ? PT LONG TERM GOAL #3  ? Title Pt will improve BERG by at least 3 points in order to demonstrate clinically significant improvement in balance.   ? Baseline 06/15/2021= 48/56   ? Time 12   ? Period Weeks   ? Status New   ? Target Date 09/07/21   ?  ? PT LONG TERM GOAL #4  ? Title  Pt will decrease 5TSTS by at least 3 seconds in order to demonstrate clinically significant improvement in LE strength   ? Baseline 06/16/2021= 23.0 sec without UE support   ? Time 12   ? Period Weeks   ?

## 2021-07-20 ENCOUNTER — Encounter: Payer: Self-pay | Admitting: Physical Therapy

## 2021-07-20 ENCOUNTER — Ambulatory Visit: Payer: Medicare Other | Admitting: Physical Therapy

## 2021-07-20 ENCOUNTER — Other Ambulatory Visit: Payer: Self-pay

## 2021-07-20 DIAGNOSIS — R262 Difficulty in walking, not elsewhere classified: Secondary | ICD-10-CM

## 2021-07-20 DIAGNOSIS — M6281 Muscle weakness (generalized): Secondary | ICD-10-CM

## 2021-07-20 DIAGNOSIS — R278 Other lack of coordination: Secondary | ICD-10-CM

## 2021-07-20 DIAGNOSIS — R269 Unspecified abnormalities of gait and mobility: Secondary | ICD-10-CM | POA: Diagnosis not present

## 2021-07-20 DIAGNOSIS — R2681 Unsteadiness on feet: Secondary | ICD-10-CM

## 2021-07-20 NOTE — Therapy (Signed)
[place] ?[facility] ?[place], [place], [address] ?Phone: [phone]   Fax:  [phone] ? ?Physical Therapy Treatment ? ?Patient Details  ?Name: Victor Gould ?MRN: [id number] ?Date of Birth: [birth year] ?Referring Provider (PT): Dr. Manuella Ghazi ? ? ?Encounter Date: 07/20/2021 ? ? PT End of Session - 07/20/21 1354   ? ? Visit Number 7   ? Number of Visits 25   ? Date for PT Re-Evaluation 09/07/21   ? Authorization Time Period Initial cert 7/41/2878- 6/76/7209   ? Progress Note Due on Visit 10   ? PT Start Time 1346   ? PT Stop Time 1430   ? PT Time Calculation (min) 44 min   ? Equipment Utilized During Treatment Gait belt   ? Activity Tolerance Patient tolerated treatment well;No increased pain   ? Behavior During Therapy [facility] for tasks assessed/performed   ? ?  ?  ? ?  ? ? ?Past Medical History:  ?Diagnosis Date  ? ASCVD (arteriosclerotic cardiovascular disease)   ? 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 Chronic kidney disease   ? Colon polyps   ? Constipation   ? Coronary artery disease   ? Diabetes mellitus   ? Family history of adverse reaction to anesthesia   ? children - PONV  ? GERD (gastroesophageal reflux disease)   ? Hemorrhoids   ? Hyperlipidemia   ? Hypertension   ? PCP [name] at [place]  ? Hypothyroidism   ? MI (myocardial infarction) ([place])   ? PONV (postoperative nausea and vomiting)   ? Thyroid disease   ? Vertigo   ? positional  ? ? ?Past Surgical History:  ?Procedure Laterality Date  ? APPENDECTOMY    ? CATARACT EXTRACTION W/PHACO Right 10/27/2018  ? Procedure: CATARACT EXTRACTION PHACO AND INTRAOCULAR LENS PLACEMENT (IOC) RIGHT, DIABETIC;  Surgeon: [name], MD;  Location: [place];  Service: Ophthalmology;  Laterality: Right;  diabetic - insulin  ? CATARACT EXTRACTION W/PHACO Left 11/25/2018  ? Procedure: CATARACT EXTRACTION PHACO AND INTRAOCULAR LENS PLACEMENT ([place]) LEFT DIABETIC;  Surgeon: [name], MD;  Location:  [place];  Service: Ophthalmology;  Laterality: Left;  ? COLONOSCOPY    ? colonoscopy with polypectomy    ? COLONOSCOPY WITH PROPOFOL N/A 12/02/2017  ? Procedure: COLONOSCOPY WITH PROPOFOL;  Surgeon: [name], MD;  Location[facility];  Service: Endoscopy;  Laterality: N/A;  ? CORONARY ARTERY BYPASS GRAFT N/A 08/28/2016  ? Procedure: CORONARY ARTERY BYPASS GRAFTING (CABG) x five, using left internal mammary artery and right leg greater saphenous vein harvested endoscopically - -LIMA to LAD, -SVG to OM, -SVG to DIAGONAL, -SEQ SVG to PDA, PLVB ;  Surgeon: [name], MD;  Location: [place];  Service: Open Heart Surgery;  Laterality: N/A;  ? CYSTOSCOPY N/A 09/29/2013  ? Procedure: CYSTOSCOPY;  Surgeon: [name], MD;  [facility];  Service: Urology;  Laterality: N/A;  ? ESOPHAGOGASTRODUODENOSCOPY (EGD) WITH PROPOFOL N/A 12/02/2017  ? Procedure: ESOPHAGOGASTRODUODENOSCOPY (EGD) WITH PROPOFOL;  Surgeon: [name], MD;  Location: [facility] ENDOSCOPY;  Service: Endoscopy;  Laterality: N/A;  ? HERNIA REPAIR    ? inguinal bilaterally  ? LEFT HEART CATH AND CORONARY ANGIOGRAPHY N/A 08/27/2016  ? Procedure: Left Heart Cath and Coronary Angiography;  Surgeon: [name], MD;  Location: [place] [facility];  Service: Cardiovascular;  Laterality: N/A;  ? ROBOT ASSISTED LAPAROSCOPIC RADICAL PROSTATECTOMY  06/25/2011  ? Procedure: ROBOTIC  ASSISTED LAPAROSCOPIC RADICAL PROSTATECTOMY LEVEL 2;  Surgeon: [name], MD;  [facility];  Service: Urology;  Laterality: N/A;  ? TEE WITHOUT CARDIOVERSION N/A 08/28/2016  ? Procedure: TRANSESOPHAGEAL ECHOCARDIOGRAM (TEE);  Surgeon: [name], MD;  Location: [place];  Service: Open Heart Surgery;  Laterality: N/A;  ? UPPER GASTROINTESTINAL ENDOSCOPY    ? URETHRAL SLING N/A 09/29/2013  ? Procedure: MALE SLING;  Surgeon: [name], MD;  [facility];  Service: Urology;  Laterality: N/A;  ? ? ?There were no vitals filed for this visit. ? ?  Subjective Assessment - 07/20/21 1353   ? ? Subjective Patient reports doing well. No loss of balance, no soreness;   ? Pertinent History Patient is a 81 year old male with diagnosis of Parkinsons referred to outpatient PT for difficulty with balance  with recent history of falling. He reports 3 falls in past 6 months. He states he does not use any assistive device and noticing more shuffling nature of walkng. 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   ? Limitations Lifting;Standing;Walking;House hold activities   ? How long can you sit comfortably? no issues   ? How long can you stand comfortably? <1 hour   ? How long can you walk comfortably? <30 min   ? Patient Stated Goals My main goal is to improve my balance so I don't fall.   ? Currently in Pain? No/denies   ? Pain Onset More than a month ago   ? ?  ?  ? ?  ? ? ? ? ? ? ?INTERVENTIONS:  ?  ?Standing Outside parallel bars: ?Instructed patient in PWR exercise: ?PWR up with cues for finger flick, looking up towards ceiling x10 reps ?PWR rock reaching to targets outside base of support x10 reps each ?PWR twist with cues for pivot on foot and increase open position between claps ?PWR step, stepping to BOSU with cues for finger flick/open hand P80 reps ?Patient required CGA intermittently, does report increased difficulty stepping with RLE; He reports increased shortness of breath but otherwise minimal difficulty; ? ? ?PWR step ups on airex with TRX straps BUE shoulder extension and head extension x10 reps with CGA for safety;  ? ? ?Seated: ?PWR step x5 reps each direction with cues for increased AROM for foot clearance and to improve large amplitude movement;  ?Progressed to stepping over 1/2 bolster with 2# ankle weight x10 reps each LE ? ?Seated trunk rotation stretch 15 sec hold x2 reps each with decreased ROM noted on right side;  ?Seated twist with each UE x10 reps to target to challenge large amplitude movement; ? ?Sit<>Stand  from mat table on airex pad with PWR up positioning x10 reps, CGA for safety; Patient required cues for trunk extension;  ? ?Instructed patient in coordination/dual task exercise: ?Forward walk with high knee march x10 feet x1 rep ?Progress to added alternate UE raise x3 laps with therapist providing verbal and tactile cues for UE coordination; Patient exhibits significant difficulty achieving alternate UE/LE lift with poor coordination noted;  ? ? ?Tolerated session well. He does report mild fatigue at end of session. At times patient did report mild dizziness especially with repetitive looking up;  ?Able to step up and stand on airex unsupported with good balance control and stability;  ? ? ? ? ? ? ? ? ? ? ? ? ? ? ? ? ? ? ? PT Education - 07/20/21 1354   ? ?  Education Details exercise technique/PWR exercise;   ? Person(s) Educated Patient   ? Methods Explanation;Verbal cues   ? Comprehension Verbalized understanding;Returned demonstration;Verbal cues required;Need further instruction   ? ?  ?  ? ?  ? ? ? PT Short Term Goals - 06/16/21 1149   ? ?  ? PT SHORT TERM GOAL #1  ? Title Pt will be independent with initial  HEP in order to improve strength and balance in order to decrease fall risk and improve function at home and work.   ? Baseline 06/15/2021- No formal HEP in place   ? Time 6   ? Period Weeks   ? Status New   ? Target Date 07/27/21   ? ?  ?  ? ?  ? ? ? ? PT Long Term Goals - 06/16/21 1150   ? ?  ? PT LONG TERM GOAL #1  ? Title Pt will be independent with Final HEP in order to improve strength and balance in order to decrease fall risk and improve function at home and work.   ? Baseline 2/16= No formal HEP in place   ? Time 12   ? Period Weeks   ? Status New   ? Target Date 09/07/21   ?  ? PT LONG TERM GOAL #2  ? Title Pt will improve FOTO to target score of 64 to display perceived improvements in ability to complete ADL's.   ? Baseline 06/15/2021= 57   ? Time 12   ? Period Weeks   ? Status New   ? Target  Date 09/07/21   ?  ? PT LONG TERM GOAL #3  ? Title Pt will improve BERG by at least 3 points in order to demonstrate clinically significant improvement in balance.   ? Baseline 06/15/2021= 48/56   ? Time

## 2021-07-25 ENCOUNTER — Encounter: Payer: Self-pay | Admitting: Physical Therapy

## 2021-07-25 ENCOUNTER — Other Ambulatory Visit: Payer: Self-pay

## 2021-07-25 ENCOUNTER — Ambulatory Visit: Payer: Medicare Other | Admitting: Physical Therapy

## 2021-07-25 VITALS — BP 180/83 | HR 72

## 2021-07-25 DIAGNOSIS — R2681 Unsteadiness on feet: Secondary | ICD-10-CM

## 2021-07-25 DIAGNOSIS — R262 Difficulty in walking, not elsewhere classified: Secondary | ICD-10-CM

## 2021-07-25 DIAGNOSIS — R269 Unspecified abnormalities of gait and mobility: Secondary | ICD-10-CM

## 2021-07-25 DIAGNOSIS — R278 Other lack of coordination: Secondary | ICD-10-CM

## 2021-07-25 DIAGNOSIS — M6281 Muscle weakness (generalized): Secondary | ICD-10-CM

## 2021-07-25 NOTE — Patient Instructions (Signed)
Access Code: [email or web address] ?URL: [email or web address] ?Date: 07/25/2021 ?Prepared by: Blanche East ? ?Exercises ?- Staggered Stance Forward Backward Weight Shift with Unilateral Counter Support  - 1 x daily - 7 x weekly - 2 sets - 15 reps ?

## 2021-07-25 NOTE — Therapy (Signed)
[facility] ?[place], [place], [address] ?Phone: [phone]   Fax:  [phone] ? ?Physical Therapy Treatment ? ?Patient Details  ?Name: Victor Gould ?MRN: [id number] ?Date of Birth: 1940/06/16 ?Referring Provider (PT): Dr. Manuella Ghazi ? ? ?Encounter Date: 07/25/2021 ? ? PT End of Session - 07/26/21 0953   ? ? Visit Number 8   ? Number of Visits 25   ? Date for PT Re-Evaluation 09/07/21   ? Authorization Time Period Initial cert 0/76/2263- 3/35/4562   ? Progress Note Due on Visit 10   ? PT Start Time 1115   ? PT Stop Time 1155   ? PT Time Calculation (min) 40 min   ? Equipment Utilized During Treatment Gait belt   ? Activity Tolerance Patient tolerated treatment well;No increased pain   ? Behavior During Therapy [facility] for tasks assessed/performed   ? ?  ?  ? ?  ? ? ?Past Medical History:  ?Diagnosis Date  ? ASCVD (arteriosclerotic cardiovascular disease)   ? Cancer [facility])   ? prostate  ? Chronic kidney disease   ? Colon polyps   ? Constipation   ? Coronary artery disease   ? Diabetes mellitus   ? Family history of adverse reaction to anesthesia   ? children - PONV  ? GERD (gastroesophageal reflux disease)   ? Hemorrhoids   ? Hyperlipidemia   ? Hypertension   ? PCP [name] at [place]  ? Hypothyroidism   ? MI (myocardial infarction) ([place])   ? PONV (postoperative nausea and vomiting)   ? Thyroid disease   ? Vertigo   ? positional  ? ? ?Past Surgical History:  ?Procedure Laterality Date  ? APPENDECTOMY    ? CATARACT EXTRACTION W/PHACO Right 10/27/2018  ? Procedure: CATARACT EXTRACTION PHACO AND INTRAOCULAR LENS PLACEMENT (IOC) RIGHT, DIABETIC;  Surgeon: [name], MD;  Location: [place];  Service: Ophthalmology;  Laterality: Right;  diabetic - insulin  ? CATARACT EXTRACTION W/PHACO Left 11/25/2018  ? Procedure: CATARACT EXTRACTION PHACO AND INTRAOCULAR LENS PLACEMENT ([place]) LEFT DIABETIC;  Surgeon: [name], MD;  Location:  [place];  Service: Ophthalmology;  Laterality: Left;  ? COLONOSCOPY    ? colonoscopy with polypectomy    ? COLONOSCOPY WITH PROPOFOL N/A 12/02/2017  ? Procedure: COLONOSCOPY WITH PROPOFOL;  Surgeon: [name], MD;  Location: [facility] ENDOSCOPY;  Service: Endoscopy;  Laterality: N/A;  ? CORONARY ARTERY BYPASS GRAFT N/A 08/28/2016  ? Procedure: CORONARY ARTERY BYPASS GRAFTING (CABG) x five, using left internal mammary artery and right leg greater saphenous vein harvested endoscopically - -LIMA to LAD, -SVG to OM, -SVG to DIAGONAL, -SEQ SVG to PDA, PLVB ;  Surgeon: [name], MD;  Location: [place];  Service: Open Heart Surgery;  Laterality: N/A;  ? CYSTOSCOPY N/A 09/29/2013  ? Procedure: CYSTOSCOPY;  Surgeon: [name], MD;  Location: [facility];  Service: Urology;  Laterality: N/A;  ? ESOPHAGOGASTRODUODENOSCOPY (EGD) WITH PROPOFOL N/A 12/02/2017  ? Procedure: ESOPHAGOGASTRODUODENOSCOPY (EGD) WITH PROPOFOL;  Surgeon: [name], MD;  Location: [facility] ENDOSCOPY;  Service: Endoscopy;  Laterality: N/A;  ? HERNIA REPAIR    ? inguinal bilaterally  ? LEFT HEART CATH AND CORONARY ANGIOGRAPHY N/A 08/27/2016  ? Procedure: Left Heart Cath and Coronary Angiography;  Surgeon: [name], MD;  Location: [place] CV LAB;  Service: Cardiovascular;  Laterality: N/A;  ? ROBOT ASSISTED LAPAROSCOPIC RADICAL PROSTATECTOMY  06/25/2011  ? Procedure: ROBOTIC  ASSISTED LAPAROSCOPIC RADICAL PROSTATECTOMY LEVEL 2;  Surgeon: [name], MD;  Location: [facility];  Service: Urology;  Laterality: N/A;  ? TEE WITHOUT CARDIOVERSION N/A 08/28/2016  ? Procedure: TRANSESOPHAGEAL ECHOCARDIOGRAM (TEE);  Surgeon: [name], MD;  Location: [place];  Service: Open Heart Surgery;  Laterality: N/A;  ? UPPER GASTROINTESTINAL ENDOSCOPY    ? URETHRAL SLING N/A 09/29/2013  ? Procedure: MALE SLING;  Surgeon: [name], MD;  Location: [facility];  Service: Urology;  Laterality: N/A;  ? ? ?Vitals:  ? 07/25/21 1126  ?BP: (!) 180/83   ?Pulse: 72  ? ? ? Subjective Assessment - 07/25/21 1125   ? ? Subjective Patient reports not feeling well today. He reports increased dizziness today and feeling like his stomach is a little upset.   ? Pertinent History Patient is a 81 year old male with diagnosis of Parkinsons referred to outpatient PT for difficulty with balance  with recent history of falling. He reports 3 falls in past 6 months. He states he does not use any assistive device and noticing more shuffling nature of walkng. 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   ? Limitations Lifting;Standing;Walking;House hold activities   ? How long can you sit comfortably? no issues   ? How long can you stand comfortably? <1 hour   ? How long can you walk comfortably? <30 min   ? Patient Stated Goals My main goal is to improve my balance so I don't fall.   ? Currently in Pain? No/denies   ? Pain Onset More than a month ago   ? ?  ?  ? ?  ? ? ? ? ? ? ? ?INTERVENTIONS:  ?  ?Instructed patient in seated stretches: ?bending down towards floor and then reaching up to ceiling and out to side ?up? x3 reps, 5 sec hold; ?Seated finger flicks with arms extended x10 reps; ? ?Standing Outside parallel bars: ?Instructed patient in PWR exercise: ?PWR up sit<>stand with airex pad with cues for finger flick, looking up towards ceiling x5 reps ?PWR rock reaching to targets outside base of support x15 reps each ?PWR twist with red tband to facilitate better shoulder ROM/strengthening x10 reps each direction;  ?PWR step, stepping to BOSU with cues for finger flick/open hand Z61 reps ?Patient required CGA intermittently, does report increased difficulty stepping with RLE; He reports increased shortness of breath but otherwise minimal difficulty; ?  ?BP following standing exercise: 161/89, no change in dizziness; ? ? Instructed patient in coordination/dual task exercise: ?Standing in staggered stance: ? Forward/backward rock and reach with UE x15  reps each LE in front with cues for sequencing and UE positioning; also required cues for erect posture;  ? ?Forward walk with patient holding walking poles and therapist walking behind for reciprocal arm swing x180 feet ?Progressed to forward walk with high knee march with walking poles to facilitate alternate UE raise x180 feet ? ?  ?Tolerated session well. He does report mild fatigue at end of session. At times patient did report mild dizziness especially with repetitive looking up;  ?  ?  ? ? ? ? ? ? ? ? ? ? ? ? ? ? ? ? ? ? ? ? ? PT Education - 07/26/21 0952   ? ? Education Details exercise technique/PWR/dual task exercise, HEP   ? Person(s) Educated Patient   ? Methods Explanation;Verbal cues   ? Comprehension Verbalized understanding;Returned demonstration;Verbal cues required;Need further instruction   ? ?  ?  ? ?  ? ? ?  PT Short Term Goals - 06/16/21 1149   ? ?  ? PT SHORT TERM GOAL #1  ? Title Pt will be independent with initial  HEP in order to improve strength and balance in order to decrease fall risk and improve function at home and work.   ? Baseline 06/15/2021- No formal HEP in place   ? Time 6   ? Period Weeks   ? Status New   ? Target Date 07/27/21   ? ?  ?  ? ?  ? ? ? ? PT Long Term Goals - 06/16/21 1150   ? ?  ? PT LONG TERM GOAL #1  ? Title Pt will be independent with Final HEP in order to improve strength and balance in order to decrease fall risk and improve function at home and work.   ? Baseline 2/16= No formal HEP in place   ? Time 12   ? Period Weeks   ? Status New   ? Target Date 09/07/21   ?  ? PT LONG TERM GOAL #2  ? Title Pt will improve FOTO to target score of 64 to display perceived improvements in ability to complete ADL's.   ? Baseline 06/15/2021= 57   ? Time 12   ? Period Weeks   ? Status New   ? Target Date 09/07/21   ?  ? PT LONG TERM GOAL #3  ? Title Pt will improve BERG by at least 3 points in order to demonstrate clinically significant improvement in balance.   ? Baseline  06/15/2021= 48/56   ? Time 12   ? Period Weeks   ? Status New   ? Target Date 09/07/21   ?  ? PT LONG TERM GOAL #4  ? Title Pt will decrease 5TSTS by at least 3 seconds in order to demonstrate clinically signific

## 2021-07-27 ENCOUNTER — Encounter: Payer: Self-pay | Admitting: Physical Therapy

## 2021-07-27 ENCOUNTER — Ambulatory Visit: Payer: Medicare Other | Admitting: Physical Therapy

## 2021-07-27 DIAGNOSIS — R262 Difficulty in walking, not elsewhere classified: Secondary | ICD-10-CM

## 2021-07-27 DIAGNOSIS — R2681 Unsteadiness on feet: Secondary | ICD-10-CM

## 2021-07-27 DIAGNOSIS — R269 Unspecified abnormalities of gait and mobility: Secondary | ICD-10-CM

## 2021-07-27 DIAGNOSIS — M6281 Muscle weakness (generalized): Secondary | ICD-10-CM

## 2021-07-27 DIAGNOSIS — R278 Other lack of coordination: Secondary | ICD-10-CM

## 2021-07-27 NOTE — Therapy (Signed)
[facility] ?[place], [place], [address] ?Phone: [phone]   Fax:  [phone] ? ?Physical Therapy Treatment ? ?Patient Details  ?Name: Victor Gould ?MRN: [id number] ?Date of Birth: Jan 22, 1941 ?Referring Provider (PT): Dr. Manuella Ghazi ? ? ?Encounter Date: 07/27/2021 ? ? PT End of Session - 07/27/21 1354   ? ? Visit Number 9   ? Number of Visits 25   ? Date for PT Re-Evaluation 09/07/21   ? Authorization Time Period Initial cert 6/38/4536- 4/68/0321   ? Progress Note Due on Visit 10   ? PT Start Time 1348   ? PT Stop Time 1430   ? PT Time Calculation (min) 42 min   ? Equipment Utilized During Treatment Gait belt   ? Activity Tolerance Patient tolerated treatment well;No increased pain   ? Behavior During Therapy [facility] for tasks assessed/performed   ? ?  ?  ? ?  ? ? ?Past Medical History:  ?Diagnosis Date  ? ASCVD (arteriosclerotic cardiovascular disease)   ? 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 Chronic kidney disease   ? Colon polyps   ? Constipation   ? Coronary artery disease   ? Diabetes mellitus   ? Family history of adverse reaction to anesthesia   ? children - PONV  ? GERD (gastroesophageal reflux disease)   ? Hemorrhoids   ? Hyperlipidemia   ? Hypertension   ? PCP [name] at [place]  ? Hypothyroidism   ? MI (myocardial infarction) ([place])   ? PONV (postoperative nausea and vomiting)   ? Thyroid disease   ? Vertigo   ? positional  ? ? ?Past Surgical History:  ?Procedure Laterality Date  ? APPENDECTOMY    ? CATARACT EXTRACTION W/PHACO Right 10/27/2018  ? Procedure: CATARACT EXTRACTION PHACO AND INTRAOCULAR LENS PLACEMENT (IOC) RIGHT, DIABETIC;  Surgeon: [name], MD;  Location: [place];  Service: Ophthalmology;  Laterality: Right;  diabetic - insulin  ? CATARACT EXTRACTION W/PHACO Left 11/25/2018  ? Procedure: CATARACT EXTRACTION PHACO AND INTRAOCULAR LENS PLACEMENT ([place]) LEFT DIABETIC;  Surgeon: [name], MD;  Location:  [place];  Service: Ophthalmology;  Laterality: Left;  ? COLONOSCOPY    ? colonoscopy with polypectomy    ? COLONOSCOPY WITH PROPOFOL N/A 12/02/2017  ? Procedure: COLONOSCOPY WITH PROPOFOL;  Surgeon: [name], MD;  Location: [facility] ENDOSCOPY;  Service: Endoscopy;  Laterality: N/A;  ? CORONARY ARTERY BYPASS GRAFT N/A 08/28/2016  ? Procedure: CORONARY ARTERY BYPASS GRAFTING (CABG) x five, using left internal mammary artery and right leg greater saphenous vein harvested endoscopically - -LIMA to LAD, -SVG to OM, -SVG to DIAGONAL, -SEQ SVG to PDA, PLVB ;  Surgeon: [name], MD;  Location: [place];  Service: Open Heart Surgery;  Laterality: N/A;  ? CYSTOSCOPY N/A 09/29/2013  ? Procedure: CYSTOSCOPY;  Surgeon: [name], MD;  [facility];  Service: Urology;  Laterality: N/A;  ? ESOPHAGOGASTRODUODENOSCOPY (EGD) WITH PROPOFOL N/A 12/02/2017  ? Procedure: ESOPHAGOGASTRODUODENOSCOPY (EGD) WITH PROPOFOL;  Surgeon: [name], MD;  Location: [facility] ENDOSCOPY;  Service: Endoscopy;  Laterality: N/A;  ? HERNIA REPAIR    ? inguinal bilaterally  ? LEFT HEART CATH AND CORONARY ANGIOGRAPHY N/A 08/27/2016  ? Procedure: Left Heart Cath and Coronary Angiography;  Surgeon: [name], MD;  Location: [place] CV LAB;  Service: Cardiovascular;  Laterality: N/A;  ? ROBOT ASSISTED LAPAROSCOPIC RADICAL PROSTATECTOMY  06/25/2011  ? Procedure: ROBOTIC  ASSISTED LAPAROSCOPIC RADICAL PROSTATECTOMY LEVEL 2;  Surgeon: [name], MD;  [facility];  Service: Urology;  Laterality: N/A;  ? TEE WITHOUT CARDIOVERSION N/A 08/28/2016  ? Procedure: TRANSESOPHAGEAL ECHOCARDIOGRAM (TEE);  Surgeon: [name], MD;  Location: [place];  Service: Open Heart Surgery;  Laterality: N/A;  ? UPPER GASTROINTESTINAL ENDOSCOPY    ? URETHRAL SLING N/A 09/29/2013  ? Procedure: MALE SLING;  Surgeon: [name], MD;  [facility];  Service: Urology;  Laterality: N/A;  ? ? ?There were no vitals filed for this visit. ? ?  Subjective Assessment - 07/27/21 1353   ? ? Subjective Patient did have procedure yesterday, he did have some issues with floaters but states that has resolved; No soreness; No restrictions from eye procedure; Also denies any dizziness;   ? Pertinent History Patient is a 81 year old male with diagnosis of Parkinsons referred to outpatient PT for difficulty with balance  with recent history of falling. He reports 3 falls in past 6 months. He states he does not use any assistive device and noticing more shuffling nature of walkng. 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   ? Limitations Lifting;Standing;Walking;House hold activities   ? How long can you sit comfortably? no issues   ? How long can you stand comfortably? <1 hour   ? How long can you walk comfortably? <30 min   ? Patient Stated Goals My main goal is to improve my balance so I don't fall.   ? Currently in Pain? No/denies   ? Pain Onset More than a month ago   ? ?  ?  ? ?  ? ? ?  ?INTERVENTIONS:  ?  ?Instructed patient in seated stretches: ?bending down towards floor and then reaching up to ceiling and out to side ?up? x3 reps, 5 sec hold; ?Posterior shoulder rolls x10 reps, moderate difficulty achieving ROM ?Lateral flexion head roll to opposite side x5 reps in flexion, x5 reps in extension ?Cervical rotation side/side x5 reps with good ROM noted;  ?Seated finger flicks with arms extended x10 reps; ?  ?Standing Outside parallel bars: ?Instructed patient in PWR exercise: ?PWR up sit<>stand with airex pad with cues for finger flick, looking forward x10 reps ?PWR rock reaching to targets outside base of support x15 reps each ? ?Standing at matrix:  ?Forward step up to airex with BUE, TRX strap shoulder extension, x2 reps, minimal difficulty, progressed with 6 inch step with airex x5 reps each LE with moderate difficulty reported;  ?Patient required min A for safety; and cues to increase push through LE for better step up; does  exhibit increased difficulty stepping with LLE;  ? ?Standing with back to wall: ?Holding small ball in BUE trunk rotation tapping wall x2-3 reps, no difficulty; ?Progressed to holding yellow weighted ball, diagonal lift down to up for shoulder/trunk extension x10 reps each with moderate difficulty reported and increased dizziness reported following exercise; ?Instructed patient in short seated rest break for recovery  ? ? Instructed patient in coordination/dual task exercise: ?Ladder drills: ?Forward walking x4 laps with cues for erect posture to avoid looking down;Patient able to take bigger step when not looking down ?Forward walk with high knee march, SLS hold 2 sec hold x2 laps, min A for safety ? Progressed with alternate UE lift x2 laps with significant difficulty ? Adjusted to raising ball in BUE during SLS march x2 laps with better ease, requiring mod VCS for  sequencing ?Side stepping with small ball chest press, stepping out (ball out) stepping together, ball to chest x1 lap each direction, patient required CGA for safety ? ?Patient tolerated session fair.  ? He does report mild fatigue at end of session. At times patient did report mild dizziness especially with looking up and rotation;  ?  ?  ?  ?  ? ? ? ? ? ? ? ? ? ? ? ? ? ? ? ? ? ? ? ? ? ? ? ? ? ? PT Education - 07/27/21 1354   ? ? Education Details exercise technique/PWR exercise, HEP   ? Person(s) Educated Patient   ? Methods Explanation;Verbal cues   ? Comprehension Verbalized understanding;Returned demonstration;Verbal cues required;Need further instruction   ? ?  ?  ? ?  ? ? ? PT Short Term Goals - 06/16/21 1149   ? ?  ? PT SHORT TERM GOAL #1  ? Title Pt will be independent with initial  HEP in order to improve strength and balance in order to decrease fall risk and improve function at home and work.   ? Baseline 06/15/2021- No formal HEP in place   ? Time 6   ? Period Weeks   ? Status New   ? Target Date 07/27/21   ? ?  ?  ? ?  ? ? ? ? PT Long Term  Goals - 06/16/21 1150   ? ?  ? PT LONG TERM GOAL #1  ? Title Pt will be independent with Final HEP in order to improve strength and balance in order to decrease fall risk and improve function at hom

## 2021-08-01 ENCOUNTER — Ambulatory Visit: Payer: Medicare Other | Attending: Neurology

## 2021-08-01 DIAGNOSIS — R278 Other lack of coordination: Secondary | ICD-10-CM

## 2021-08-01 DIAGNOSIS — R42 Dizziness and giddiness: Secondary | ICD-10-CM | POA: Insufficient documentation

## 2021-08-01 DIAGNOSIS — R2681 Unsteadiness on feet: Secondary | ICD-10-CM | POA: Diagnosis present

## 2021-08-01 DIAGNOSIS — M6281 Muscle weakness (generalized): Secondary | ICD-10-CM

## 2021-08-01 DIAGNOSIS — R269 Unspecified abnormalities of gait and mobility: Secondary | ICD-10-CM | POA: Diagnosis not present

## 2021-08-01 DIAGNOSIS — R262 Difficulty in walking, not elsewhere classified: Secondary | ICD-10-CM | POA: Diagnosis present

## 2021-08-01 NOTE — Therapy (Signed)
[place] [facility] ?[place], [place], [address] ?Phone: [phone]   Fax:  [phone] ? ?Physical Therapy Treatment/Physical Therapy Progress Note ? ? ?Dates of reporting period  06/15/2021   to   08/01/2021 ? ?Patient Details  ?Name: Victor Gould ?MRN: [id number] ?Date of Birth: 03-14-1941 ?Referring Provider (PT): Dr. Manuella Ghazi ? ? ?Encounter Date: 08/01/2021 ? ? PT End of Session - 08/01/21 1342   ? ? Visit Number 10   ? Number of Visits 25   ? Date for PT Re-Evaluation 09/07/21   ? Authorization Time Period Initial cert 9/47/6546- 08/31/5463   ? Progress Note Due on Visit 10   ? PT Start Time 1345   ? PT Stop Time 1428   ? PT Time Calculation (min) 43 min   ? Equipment Utilized During Treatment Gait belt   ? Activity Tolerance Patient tolerated treatment well;No increased pain   ? Behavior During Therapy [facility] for tasks assessed/performed   ? ?  ?  ? ?  ? ? ?Past Medical History:  ?Diagnosis Date  ? ASCVD (arteriosclerotic cardiovascular disease)   ? 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 Chronic kidney disease   ? Colon polyps   ? Constipation   ? Coronary artery disease   ? Diabetes mellitus   ? Family history of adverse reaction to anesthesia   ? children - PONV  ? GERD (gastroesophageal reflux disease)   ? Hemorrhoids   ? Hyperlipidemia   ? Hypertension   ? PCP [name] at [place]  ? Hypothyroidism   ? MI (myocardial infarction) ([place])   ? PONV (postoperative nausea and vomiting)   ? Thyroid disease   ? Vertigo   ? positional  ? ? ?Past Surgical History:  ?Procedure Laterality Date  ? APPENDECTOMY    ? CATARACT EXTRACTION W/PHACO Right 10/27/2018  ? Procedure: CATARACT EXTRACTION PHACO AND INTRAOCULAR LENS PLACEMENT (IOC) RIGHT, DIABETIC;  Surgeon: [name], MD;  Location: [place];  Service: Ophthalmology;  Laterality: Right;  diabetic - insulin  ? CATARACT EXTRACTION W/PHACO Left 11/25/2018  ? Procedure: CATARACT EXTRACTION PHACO AND  INTRAOCULAR LENS PLACEMENT ([place]) LEFT DIABETIC;  Surgeon: [name], MD;  Location: [place];  Service: Ophthalmology;  Laterality: Left;  ? COLONOSCOPY    ? colonoscopy with polypectomy    ? COLONOSCOPY WITH PROPOFOL N/A 12/02/2017  ? Procedure: COLONOSCOPY WITH PROPOFOL;  Surgeon: [name], MD;  Location: [facility] ENDOSCOPY;  Service: Endoscopy;  Laterality: N/A;  ? CORONARY ARTERY BYPASS GRAFT N/A 08/28/2016  ? Procedure: CORONARY ARTERY BYPASS GRAFTING (CABG) x five, using left internal mammary artery and right leg greater saphenous vein harvested endoscopically - -LIMA to LAD, -SVG to OM, -SVG to DIAGONAL, -SEQ SVG to PDA, PLVB ;  Surgeon: [name], MD;  Location: [place];  Service: Open Heart Surgery;  Laterality: N/A;  ? CYSTOSCOPY N/A 09/29/2013  ? Procedure: CYSTOSCOPY;  Surgeon: [name], MD;  Location: [facility];  Service: Urology;  Laterality: N/A;  ? ESOPHAGOGASTRODUODENOSCOPY (EGD) WITH PROPOFOL N/A 12/02/2017  ? Procedure: ESOPHAGOGASTRODUODENOSCOPY (EGD) WITH PROPOFOL;  Surgeon: [name], MD;  Location: [facility] ENDOSCOPY;  Service: Endoscopy;  Laterality: N/A;  ? HERNIA REPAIR    ? inguinal bilaterally  ? LEFT HEART CATH AND CORONARY ANGIOGRAPHY N/A 08/27/2016  ? Procedure: Left Heart Cath and Coronary Angiography;  Surgeon: [name], MD;  Location: [place] [facility];  Service:  Cardiovascular;  Laterality: N/A;  ? ROBOT ASSISTED LAPAROSCOPIC RADICAL PROSTATECTOMY  06/25/2011  ? Procedure: ROBOTIC ASSISTED LAPAROSCOPIC RADICAL PROSTATECTOMY LEVEL 2;  Surgeon: [name], MD;  Location: [facility];  Service: Urology;  Laterality: N/A;  ? TEE WITHOUT CARDIOVERSION N/A 08/28/2016  ? Procedure: TRANSESOPHAGEAL ECHOCARDIOGRAM (TEE);  Surgeon: [name], MD;  Location: [place];  Service: Open Heart Surgery;  Laterality: N/A;  ? UPPER GASTROINTESTINAL ENDOSCOPY    ? URETHRAL SLING N/A 09/29/2013  ? Procedure: MALE SLING;  Surgeon: [name], MD;  [facility];  Service: Urology;  Laterality: N/A;  ? ? ?There were no vitals filed for this visit. ? ? Subjective Assessment - 08/01/21 1341   ? ? Subjective Patient reports having trouble walking like he was instructed. States without the poles and assist that he has a hard time negotiating sequencing.   ? Pertinent History Patient is a 81 year old male with diagnosis of Parkinsons referred to outpatient PT for difficulty with balance  with recent history of falling. He reports 3 falls in past 6 months. He states he does not use any assistive device and noticing more shuffling nature of walkng. 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   ? Limitations Lifting;Standing;Walking;House hold activities   ? How long can you sit comfortably? no issues   ? How long can you stand comfortably? <1 hour   ? How long can you walk comfortably? <30 min   ? Patient Stated Goals My main goal is to improve my balance so I don't fall.   ? Currently in Pain? No/denies   ? ?  ?  ? ?  ? ? ? ? ? [facility] PT Assessment - 08/01/21 1416   ? ?  ? Berg Balance Test  ? Sit to Stand Able to stand without using hands and stabilize independently   ? Standing Unsupported Able to stand safely 2 minutes   ? Sitting with Back Unsupported but Feet Supported on Floor or Stool Able to sit safely and securely 2 minutes   ? Stand to Sit Sits safely with minimal use of hands   ? Transfers Able to transfer safely, minor use of hands   ? Standing Unsupported with Eyes Closed Able to stand 10 seconds safely   ? Standing Unsupported with Feet Together Able to place feet together independently and stand 1 minute safely   ? From Standing, Reach Forward with Outstretched Arm Can reach confidently >25 cm (10")   ? From Standing Position, Pick up Object from [place] to pick up shoe safely and easily   ? From Standing Position, Turn to Look Behind Over each Shoulder Turn sideways only but maintains balance   ? Turn 360 Degrees Able to turn 360  degrees safely one side only in 4 seconds or less   ? Standing Unsupported, Alternately Place Feet on Step/Stool Able to stand independently and safely and complete 8 steps in 20 seconds   ? Standing Unsupported, One Foot in Front Able to take small step independently and hold 30 seconds   ? Standing on One Leg Able to lift leg independently and hold 5-10 seconds   ? Total Score 50   ? ?  ?  ? ?  ?INTERVENTIONS:  ? ? ? ?Patient reports he is doing well but having trouble with arm swing with reciprocal steps.  ? ? ?Reassessed all STG and LTG for progress note: ? ?HEP: Patient  verbalized knowledge of initial HEP- inlcuidng walking and balance.- GOAL MET ? ?FOTO: 69 (GOAL Achieved)  ? ?5xSTS= 14.14 sec without UE support (Goal achieved)  ? ? ? ?BERG: 50/56 (improved from 48/56)  ? ? ? ?10MWT= 0.9 m/s (improved from 0.81 m/s)  ? ? ?TUG+ 14.10 sec without an AD (improved from 17.44 sec)  ? ?  ? ? ? ? ? PT Education - 08/01/21 1342   ? ? Education Details Exercise technique   ? Person(s) Educated Patient   ? Methods Explanation;Demonstration;Tactile cues;Verbal cues   ? Comprehension Verbalized understanding;Returned demonstration;Verbal cues required;Tactile cues required;Need further instruction   ? ?  ?  ? ?  ? ? ? PT Short Term Goals - 08/01/21 1347   ? ?  ? PT SHORT TERM GOAL #1  ? Title Pt will be independent with initial  HEP in order to improve strength and balance in order to decrease fall risk and improve function at home and work.   ? Baseline 06/15/2021- No formal HEP in place; 08/01/2021- Patient verbalized knowledge of initial HEP- inlcuidng walking and balance.   ? Time 6   ? Period Weeks   ? Status Achieved   ? Target Date 07/27/21   ? ?  ?  ? ?  ? ? ? ? PT Long Term Goals - 08/01/21 1348   ? ?  ? PT LONG TERM GOAL #1  ? Title Pt will be independent with Final HEP in order to improve strength and balance in order to decrease fall risk and improve function at home and work.   ? Baseline 2/16= No formal HEP in  place 08/01/2021= See short term goal section   ? Time 12   ? Period Weeks   ? Status On-going   ? Target Date 09/07/21   ?  ? PT LONG TERM GOAL #2  ? Title Pt will improve FOTO to target score of 64 to displ

## 2021-08-04 ENCOUNTER — Ambulatory Visit: Payer: Medicare Other | Admitting: Physical Therapy

## 2021-08-04 DIAGNOSIS — R269 Unspecified abnormalities of gait and mobility: Secondary | ICD-10-CM

## 2021-08-04 DIAGNOSIS — R262 Difficulty in walking, not elsewhere classified: Secondary | ICD-10-CM

## 2021-08-04 DIAGNOSIS — M6281 Muscle weakness (generalized): Secondary | ICD-10-CM

## 2021-08-04 DIAGNOSIS — R2681 Unsteadiness on feet: Secondary | ICD-10-CM

## 2021-08-04 NOTE — Therapy (Signed)
[place] [facility] ?[place], [place], [address] ?Phone: [phone]   Fax:  [phone] ? ?Physical Therapy Treatment ? ?Patient Details  ?Name: Victor Gould ?MRN: [id number] ?Date of Birth: [birth year] ?Referring Provider (PT): Dr. Manuella Ghazi ? ? ?Encounter Date: 08/04/2021 ? ? PT End of Session - 08/04/21 1025   ? ? Visit Number 11   ? Number of Visits 25   ? Date for PT Re-Evaluation 09/07/21   ? Authorization Time Period Initial cert 05/01/5850- 7/78/2423   ? [place] Note Due on Visit 10   ? PT Start Time 1020   ? PT Stop Time 1100   ? PT Time Calculation (min) 40 min   ? Equipment Utilized During Treatment Gait belt   ? Activity Tolerance Patient tolerated treatment well;No increased pain   ? Behavior During Therapy [facility] for tasks assessed/performed   ? ?  ?  ? ?  ? ? ?Past Medical History:  ?Diagnosis Date  ? ASCVD (arteriosclerotic cardiovascular disease)   ? Cancer [facility])   ? prostate  ? Chronic kidney disease   ? Colon polyps   ? Constipation   ? Coronary artery disease   ? Diabetes mellitus   ? Family history of adverse reaction to anesthesia   ? children - PONV  ? GERD (gastroesophageal reflux disease)   ? Hemorrhoids   ? Hyperlipidemia   ? Hypertension   ? PCP [name] at [place]  ? Hypothyroidism   ? MI (myocardial infarction) ([place])   ? PONV (postoperative nausea and vomiting)   ? Thyroid disease   ? Vertigo   ? positional  ? ? ?Past Surgical History:  ?Procedure Laterality Date  ? APPENDECTOMY    ? CATARACT EXTRACTION W/PHACO Right 10/27/2018  ? Procedure: CATARACT EXTRACTION PHACO AND INTRAOCULAR LENS PLACEMENT (IOC) RIGHT, DIABETIC;  Surgeon: [name], MD;  Location: [place];  Service: Ophthalmology;  Laterality: Right;  diabetic - insulin  ? CATARACT EXTRACTION W/PHACO Left 11/25/2018  ? Procedure: CATARACT EXTRACTION PHACO AND INTRAOCULAR LENS PLACEMENT ([place]) LEFT DIABETIC;  Surgeon: [name], MD;  Location:  [place];  Service: Ophthalmology;  Laterality: Left;  ? COLONOSCOPY    ? colonoscopy with polypectomy    ? COLONOSCOPY WITH PROPOFOL N/A 12/02/2017  ? Procedure: COLONOSCOPY WITH PROPOFOL;  Surgeon: [name], MD;  Location[facility] ENDOSCOPY;  Service: Endoscopy;  Laterality: N/A;  ? CORONARY ARTERY BYPASS GRAFT N/A 08/28/2016  ? Procedure: CORONARY ARTERY BYPASS GRAFTING (CABG) x five, using left internal mammary artery and right leg greater saphenous vein harvested endoscopically - -LIMA to LAD, -SVG to OM, -SVG to DIAGONAL, -SEQ SVG to PDA, PLVB ;  Surgeon: [name], MD;  Location: [place];  Service: Open Heart Surgery;  Laterality: N/A;  ? CYSTOSCOPY N/A 09/29/2013  ? Procedure: CYSTOSCOPY;  Surgeon: [name], MD;  [facility];  Service: Urology;  Laterality: N/A;  ? ESOPHAGOGASTRODUODENOSCOPY (EGD) WITH PROPOFOL N/A 12/02/2017  ? Procedure: ESOPHAGOGASTRODUODENOSCOPY (EGD) WITH PROPOFOL;  Surgeon: [name], MD;  [facility] END[facility];  Service: Endoscopy;  Laterality: N/A;  ? HERNIA REPAIR    ? inguinal bilaterally  ? LEFT HEART CATH AND CORONARY ANGIOGRAPHY N/A 08/27/2016  ? Procedure: Left Heart Cath and Coronary Angiography;  Surgeon: [name], MD;  Location: [place] [facility];  Service: Cardiovascular;  Laterality: N/A;  ? ROBOT ASSISTED LAPAROSCOPIC RADICAL PROSTATECTOMY  06/25/2011  ? Procedure: ROBOTIC  ASSISTED LAPAROSCOPIC RADICAL PROSTATECTOMY LEVEL 2;  Surgeon: [name], MD;  [facility];  Service: Urology;  Laterality: N/A;  ? TEE WITHOUT CARDIOVERSION N/A 08/28/2016  ? Procedure: TRANSESOPHAGEAL ECHOCARDIOGRAM (TEE);  Surgeon: [name], MD;  Location: [place];  Service: Open Heart Surgery;  Laterality: N/A;  ? UPPER GASTROINTESTINAL ENDOSCOPY    ? URETHRAL SLING N/A 09/29/2013  ? Procedure: MALE SLING;  Surgeon: [name], MD;  [facility];  Service: Urology;  Laterality: N/A;  ? ? ?There were no vitals filed for this visit. ? ?  Subjective Assessment - 08/04/21 1024   ? ? Subjective Pt reports no falls or LOB since last session. Pt had some difficulty with maintaining balance wiht getting table ready for easter.   ? Pertinent History Patient is a 81 year old male with diagnosis of Parkinsons referred to outpatient PT for difficulty with balance  with recent history of falling. He reports 3 falls in past 6 months. He states he does not use any assistive device and noticing more shuffling nature of walkng. 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   ? Limitations Lifting;Standing;Walking;House hold activities   ? How long can you sit comfortably? no issues   ? How long can you stand comfortably? <1 hour   ? How long can you walk comfortably? <30 min   ? Patient Stated Goals My main goal is to improve my balance so I don't fall.   ? ?  ?  ? ?  ? ? ? ? ? ?Exercise/Activity Sets/Reps/Time/ Resistance Assistance Charge type Comments  ?Ladder drill with turn around on airex  6 x through  Gait training  Cues for listening for foot hitting ladder for feedback of performance and trying to keep head up throughout  ?Step up (to SL stance) with contralateral arm swing on 6 inch step  2*10 x ea   NMR Difficulty with sequencing arm movements, improved with practice, had pt hold balance on SL ?Second set improved efficacy and was able to complete bith sets with good knee drive and contralateral UE sequencing   ?SLS  1 x 30 sec ea  NMR  Increased difficulty on R LE > L LE, cues for posture   ?Semitandem on airex  10 x lat head turns  ?10 x sup/ inf head turns ( ea LE post)   NMR Cues for upright posture with shoulders back   ?Big steps with arm swings 8 x lengh of long hallway   Gait training Able to complete 6 wsteps with arm swings consistently then consistently unable to continue past this. ?Cues for leading with upper extremities and also stopping to reset when he gets out of sequence.  ?      ?      ?      ?      ?      ?       ?Treatment Provided this session  ? ?Pt educated throughout session about proper posture and technique with exercises. Improved exercise technique, movement at target joints, use of target muscles after min to mod verbal, visual, tactile cues. ?Note: Portions of this document were prepared using Dragon voice recognition software and although reviewed may contain unintentional dictation errors in syntax, grammar, or spelling. ? ? ? ? ? ? ? ? ? ? ? ? ? ? ? ? ? ? ? ? ? ? ? ? ? PT Education - 08/04/21  1137   ? ? Education Details exercise technique   ? Person(s) Educated Patient   ? Methods Explanation   ? Comprehension Verbalized understanding   ? ?  ?  ? ?  ? ? ? PT Short Term Goals - 08/01/21 1347   ? ?  ? PT SHORT TERM GOAL #1  ? Title Pt will be independent with initial  HEP in order to improve strength and balance in order to decrease fall risk and improve function at home and work.   ? Baseline 06/15/2021- No formal HEP in place; 08/01/2021- Patient verbalized knowledge of initial HEP- inlcuidng walking and balance.   ? Time 6   ? Period Weeks   ? Status Achieved   ? Target Date 07/27/21   ? ?  ?  ? ?  ? ? ? ? PT Long Term Goals - 08/01/21 1348   ? ?  ? PT LONG TERM GOAL #1  ? Title Pt will be independent with Final HEP in order to improve strength and balance in order to decrease fall risk and improve function at home and work.   ? Baseline 2/16= No formal HEP in place 08/01/2021= See short term goal section   ? Time 12   ? Period Weeks   ? Status On-going   ? Target Date 09/07/21   ?  ? PT LONG TERM GOAL #2  ? Title Pt will improve FOTO to target score of 64 to display perceived improvements in ability to complete ADL's.   ? Baseline 06/15/2021= 57: 08/01/4021= 69   ? Time 12   ? Period Weeks   ? Status Achieved   ? Target Date 09/07/21   ?  ? PT LONG TERM GOAL #3  ? Title Pt will improve BERG by at least 3 points in order to demonstrate clinically significant improvement in balance.   ? Baseline 06/15/2021= 48/56:  08/01/2021= 50/56   ? Time 12   ? Period Weeks   ? Status On-going   ? Target Date 09/07/21   ?  ? PT LONG TERM GOAL #4  ? Title Pt will decrease 5TSTS by at least 3 seconds in order to demonstrate cl

## 2021-08-07 ENCOUNTER — Ambulatory Visit: Payer: Medicare Other | Admitting: Physical Therapy

## 2021-08-07 DIAGNOSIS — R262 Difficulty in walking, not elsewhere classified: Secondary | ICD-10-CM

## 2021-08-07 DIAGNOSIS — R269 Unspecified abnormalities of gait and mobility: Secondary | ICD-10-CM

## 2021-08-07 DIAGNOSIS — R2681 Unsteadiness on feet: Secondary | ICD-10-CM

## 2021-08-07 NOTE — Therapy (Signed)
[place] [facility] ?[place], [place], [address] ?Phone: [phone]   Fax:  [phone] ? ?Physical Therapy Treatment ? ?Patient Details  ?Name: Victor Gould ?MRN: [id number] ?Date of Birth: [birth year] ?Referring Provider (PT): Dr. Manuella Ghazi ? ? ?Encounter Date: 08/07/2021 ? ? PT End of Session - 08/07/21 1207   ? ? Visit Number 12   ? Number of Visits 25   ? Date for PT Re-Evaluation 09/07/21   ? Authorization Time Period Initial cert 4/82/7078- 6/75/4492   ? Progress Note Due on Visit 20   ? PT Start Time 1100   ? PT Stop Time 1145   ? PT Time Calculation (min) 45 min   ? Equipment Utilized During Treatment Gait belt   ? Activity Tolerance Patient tolerated treatment well;No increased pain   ? Behavior During Therapy [facility] for tasks assessed/performed   ? ?  ?  ? ?  ? ? ?Past Medical History:  ?Diagnosis Date  ? ASCVD (arteriosclerotic cardiovascular disease)   ? 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 Chronic kidney disease   ? Colon polyps   ? Constipation   ? Coronary artery disease   ? Diabetes mellitus   ? Family history of adverse reaction to anesthesia   ? children - PONV  ? GERD (gastroesophageal reflux disease)   ? Hemorrhoids   ? Hyperlipidemia   ? Hypertension   ? PCP [name] at [place]  ? Hypothyroidism   ? MI (myocardial infarction) ([place])   ? PONV (postoperative nausea and vomiting)   ? Thyroid disease   ? Vertigo   ? positional  ? ? ?Past Surgical History:  ?Procedure Laterality Date  ? APPENDECTOMY    ? CATARACT EXTRACTION W/PHACO Right 10/27/2018  ? Procedure: CATARACT EXTRACTION PHACO AND INTRAOCULAR LENS PLACEMENT (IOC) RIGHT, DIABETIC;  Surgeon: [name], MD;  Location: [place];  Service: Ophthalmology;  Laterality: Right;  diabetic - insulin  ? CATARACT EXTRACTION W/PHACO Left 11/25/2018  ? Procedure: CATARACT EXTRACTION PHACO AND INTRAOCULAR LENS PLACEMENT ([place]) LEFT DIABETIC;  Surgeon: [name], MD;  Location:  [place];  Service: Ophthalmology;  Laterality: Left;  ? COLONOSCOPY    ? colonoscopy with polypectomy    ? COLONOSCOPY WITH PROPOFOL N/A 12/02/2017  ? Procedure: COLONOSCOPY WITH PROPOFOL;  Surgeon: [name], MD;  Location: [facility] ENDOSCOPY;  Service: Endoscopy;  Laterality: N/A;  ? CORONARY ARTERY BYPASS GRAFT N/A 08/28/2016  ? Procedure: CORONARY ARTERY BYPASS GRAFTING (CABG) x five, using left internal mammary artery and right leg greater saphenous vein harvested endoscopically - -LIMA to LAD, -SVG to OM, -SVG to DIAGONAL, -SEQ SVG to PDA, PLVB ;  Surgeon: [name], MD;  Location: [place];  Service: Open Heart Surgery;  Laterality: N/A;  ? CYSTOSCOPY N/A 09/29/2013  ? Procedure: CYSTOSCOPY;  Surgeon: [name], MD;  Location: [facility];  Service: Urology;  Laterality: N/A;  ? ESOPHAGOGASTRODUODENOSCOPY (EGD) WITH PROPOFOL N/A 12/02/2017  ? Procedure: ESOPHAGOGASTRODUODENOSCOPY (EGD) WITH PROPOFOL;  Surgeon: [name], MD;  Location: [facility] ENDOSCOPY;  Service: Endoscopy;  Laterality: N/A;  ? HERNIA REPAIR    ? inguinal bilaterally  ? LEFT HEART CATH AND CORONARY ANGIOGRAPHY N/A 08/27/2016  ? Procedure: Left Heart Cath and Coronary Angiography;  Surgeon: [name], MD;  Location: [place] [facility];  Service: Cardiovascular;  Laterality: N/A;  ? ROBOT ASSISTED LAPAROSCOPIC RADICAL PROSTATECTOMY  06/25/2011  ? Procedure: ROBOTIC  ASSISTED LAPAROSCOPIC RADICAL PROSTATECTOMY LEVEL 2;  Surgeon: [name], MD;  Location: [facility];  Service: Urology;  Laterality: N/A;  ? TEE WITHOUT CARDIOVERSION N/A 08/28/2016  ? Procedure: TRANSESOPHAGEAL ECHOCARDIOGRAM (TEE);  Surgeon: [name], MD;  Location: [place];  Service: Open Heart Surgery;  Laterality: N/A;  ? UPPER GASTROINTESTINAL ENDOSCOPY    ? URETHRAL SLING N/A 09/29/2013  ? Procedure: MALE SLING;  Surgeon: [name], MD;  [facility]: [facility];  Service: Urology;  Laterality: N/A;  ? ? ?There were no vitals filed for this visit. ? ?  Subjective Assessment - 08/07/21 1103   ? ? Subjective Pt reports no falls or LOB since last session. Pt requests further exercises for his posture. Reports some gluteal soress following last session.   ? Pertinent History Patient is a 81 year old male with diagnosis of Parkinsons referred to outpatient PT for difficulty with balance  with recent history of falling. He reports 3 falls in past 6 months. He states he does not use any assistive device and noticing more shuffling nature of walkng. 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   ? Limitations Lifting;Standing;Walking;House hold activities   ? How long can you sit comfortably? no issues   ? How long can you stand comfortably? <1 hour   ? How long can you walk comfortably? <30 min   ? Patient Stated Goals My main goal is to improve my balance so I don't fall.   ? ?  ?  ? ?  ? ? ?Exercise/Activity Sets/Reps/Time/ Resistance Assistance Charge type Comments  ?Ladder drill with turn around on airex  6 x through  Gait training  Cues for listening for foot hitting ladder for feedback of performance and trying to keep head up throughout  ?Step up (to SL stance) with contralateral arm swing on 6 inch step  2*10 x ea   NMR Difficulty with sequencing arm movements, improved with practice, had pt hold balance on SL ?Second set improved efficacy and was able to complete bith sets with good knee drive and contralateral UE sequencing   ?STS with airex under feet with PWR! Arm movement sand weight shifting  2 x 10   NMR  Increased difficulty on R LE > L LE, cues for posture   ?Semitandem on airex  10 x lat head turns  ?10 x sup/ inf head turns ( ea LE post)   NMR Cues for upright posture with shoulders back   ?SLS progression with ball hand off ( 3KG)  8 x lengh of long hallway   NMR Able to complete 6 wsteps with arm swings consistently then consistently unable to continue past this. ?Cues for leading with upper extremities and also stopping to  reset when he gets out of sequence.  ?      ?Rows seated on Dyna disc  2*10 with BTB   Therex  Cues for upright posture   ?Supine and prone PWR! Up  *15 ea  NMR To improve posture.   ?      ?      ?      ?Treatment Provided this session  ? ?Pt educated throughout session about proper posture and technique with exercises. Improved exercise technique, movement at target joints, use of target muscles after min to mod verbal, visual, tactile cues. ?Note: Portions of this document were prepared using Dragon voice recognition software and although reviewed may contain unintentional dictation errors in  syntax, grammar, or spelling. ? ? ? ? ? ? ? ? ? ? ? ? ? ? ? ? ? ? ? ? ? ? ? ? ? ? ? ? ? ? ? ? PT Education - 08/07/21 1256   ? ? Education Details Postural exercises PWR! in supine and prone   ? Person(s) Educated Patient   ? Methods Explanation   ? Comprehension Verbalized understanding   ? ?  ?  ? ?  ? ? ? PT Short Term Goals - 08/01/21 1347   ? ?  ? PT SHORT TERM GOAL #1  ? Title Pt will be independent with initial  HEP in order to improve strength and balance in order to decrease fall risk and improve function at home and work.   ? Baseline 06/15/2021- No formal HEP in place; 08/01/2021- Patient verbalized knowledge of initial HEP- inlcuidng walking and balance.   ? Time 6   ? Period Weeks   ? Status Achieved   ? Target Date 07/27/21   ? ?  ?  ? ?  ? ? ? ? PT Long Term Goals - 08/01/21 1348   ? ?  ? PT LONG TERM GOAL #1  ? Title Pt will be independent with Final HEP in order to improve strength and balance in order to decrease fall risk and improve function at home and work.   ? Baseline 2/16= No formal HEP in place 08/01/2021= See short term goal section   ? Time 12   ? Period Weeks   ? Status On-going   ? Target Date 09/07/21   ?  ? PT LONG TERM GOAL #2  ? Title Pt will improve FOTO to target score of 64 to display perceived improvements in ability to complete ADL's.   ? Baseline 06/15/2021= 57: 08/01/4021= 69   ? Time 12   ?  Period Weeks   ? Status Achieved   ? Target Date 09/07/21   ?  ? PT LONG TERM GOAL #3  ? Title Pt will improve BERG by at least 3 points in order to demonstrate clinically significant improvement i

## 2021-08-09 ENCOUNTER — Ambulatory Visit: Payer: Medicare Other

## 2021-08-09 DIAGNOSIS — R278 Other lack of coordination: Secondary | ICD-10-CM

## 2021-08-09 DIAGNOSIS — R269 Unspecified abnormalities of gait and mobility: Secondary | ICD-10-CM

## 2021-08-09 DIAGNOSIS — R2681 Unsteadiness on feet: Secondary | ICD-10-CM

## 2021-08-09 DIAGNOSIS — R262 Difficulty in walking, not elsewhere classified: Secondary | ICD-10-CM

## 2021-08-09 DIAGNOSIS — M6281 Muscle weakness (generalized): Secondary | ICD-10-CM

## 2021-08-09 NOTE — Therapy (Signed)
[place] ?[facility] ?[place], [place], [address] ?Phone: [phone]   Fax:  [phone] ? ?Physical Therapy Treatment ? ?Patient Details  ?Name: Victor Gould ?MRN: [id number] ?Date of Birth: [birth year] ?Referring Provider (PT): Dr. Manuella Ghazi ? ? ?Encounter Date: 08/09/2021 ? ? PT End of Session - 08/09/21 1308   ? ? Visit Number 13   ? Number of Visits 25   ? Date for PT Re-Evaluation 09/07/21   ? Authorization Time Period Initial cert 6/33/3545- 10/23/6387   ? Progress Note Due on Visit 20   ? PT Start Time 1306   ? PT Stop Time 1344   ? PT Time Calculation (min) 38 min   ? Equipment Utilized During Treatment Gait belt   ? Activity Tolerance Patient tolerated treatment well;No increased pain   ? Behavior During Therapy [facility] for tasks assessed/performed   ? ?  ?  ? ?  ? ? ?Past Medical History:  ?Diagnosis Date  ? ASCVD (arteriosclerotic cardiovascular disease)   ? Cancer [facility])   ? prostate  ? Chronic kidney disease   ? Colon polyps   ? Constipation   ? Coronary artery disease   ? Diabetes mellitus   ? Family history of adverse reaction to anesthesia   ? children - PONV  ? GERD (gastroesophageal reflux disease)   ? Hemorrhoids   ? Hyperlipidemia   ? Hypertension   ? PCP [name] at [place]  ? Hypothyroidism   ? MI (myocardial infarction) ([place])   ? PONV (postoperative nausea and vomiting)   ? Thyroid disease   ? Vertigo   ? positional  ? ? ?Past Surgical History:  ?Procedure Laterality Date  ? APPENDECTOMY    ? CATARACT EXTRACTION W/PHACO Right 10/27/2018  ? Procedure: CATARACT EXTRACTION PHACO AND INTRAOCULAR LENS PLACEMENT (IOC) RIGHT, DIABETIC;  Surgeon: [name], MD;  Location: [place];  Service: Ophthalmology;  Laterality: Right;  diabetic - insulin  ? CATARACT EXTRACTION W/PHACO Left 11/25/2018  ? Procedure: CATARACT EXTRACTION PHACO AND INTRAOCULAR LENS PLACEMENT ([place]) LEFT DIABETIC;  Surgeon: [name], MD;  Location:  [place];  Service: Ophthalmology;  Laterality: Left;  ? COLONOSCOPY    ? colonoscopy with polypectomy    ? COLONOSCOPY WITH PROPOFOL N/A 12/02/2017  ? Procedure: COLONOSCOPY WITH PROPOFOL;  Surgeon: [name], MD;  Location: [facility] ENDOSCOPY;  Service: Endoscopy;  Laterality: N/A;  ? CORONARY ARTERY BYPASS GRAFT N/A 08/28/2016  ? Procedure: CORONARY ARTERY BYPASS GRAFTING (CABG) x five, using left internal mammary artery and right leg greater saphenous vein harvested endoscopically - -LIMA to LAD, -SVG to OM, -SVG to DIAGONAL, -SEQ SVG to PDA, PLVB ;  Surgeon: [name], MD;  Location: [place];  Service: Open Heart Surgery;  Laterality: N/A;  ? CYSTOSCOPY N/A 09/29/2013  ? Procedure: CYSTOSCOPY;  Surgeon: [name], MD;  [facility];  Service: Urology;  Laterality: N/A;  ? ESOPHAGOGASTRODUODENOSCOPY (EGD) WITH PROPOFOL N/A 12/02/2017  ? Procedure: ESOPHAGOGASTRODUODENOSCOPY (EGD) WITH PROPOFOL;  Surgeon: [name], MD;  Location: [facility] ENDOSCOPY;  Service: Endoscopy;  Laterality: N/A;  ? HERNIA REPAIR    ? inguinal bilaterally  ? LEFT HEART CATH AND CORONARY ANGIOGRAPHY N/A 08/27/2016  ? Procedure: Left Heart Cath and Coronary Angiography;  Surgeon: [name], MD;  Location: [place] [facility];  Service: Cardiovascular;  Laterality: N/A;  ? ROBOT ASSISTED LAPAROSCOPIC RADICAL PROSTATECTOMY  06/25/2011  ? Procedure: ROBOTIC  ASSISTED LAPAROSCOPIC RADICAL PROSTATECTOMY LEVEL 2;  Surgeon: [name], MD;  [facility];  Service: Urology;  Laterality: N/A;  ? TEE WITHOUT CARDIOVERSION N/A 08/28/2016  ? Procedure: TRANSESOPHAGEAL ECHOCARDIOGRAM (TEE);  Surgeon: [name], MD;  Location: [place];  Service: Open Heart Surgery;  Laterality: N/A;  ? UPPER GASTROINTESTINAL ENDOSCOPY    ? URETHRAL SLING N/A 09/29/2013  ? Procedure: MALE SLING;  Surgeon: [name], MD;  [facility];  Service: Urology;  Laterality: N/A;  ? ? ?There were no vitals filed for this visit. ? ?  Subjective Assessment - 08/09/21 1340   ? ? Subjective Patient reports dealing some intermittent dizziness in the morning with getting in/out of bed and with head turns.   ? Pertinent History Patient is a 81 year old male with diagnosis of Parkinsons referred to outpatient PT for difficulty with balance  with recent history of falling. He reports 3 falls in past 6 months. He states he does not use any assistive device and noticing more shuffling nature of walkng. 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   ? Limitations Lifting;Standing;Walking;House hold activities   ? How long can you sit comfortably? no issues   ? How long can you stand comfortably? <1 hour   ? How long can you walk comfortably? <30 min   ? Patient Stated Goals My main goal is to improve my balance so I don't fall.   ? ?  ?  ? ?  ? ? ?BP= 169/96 mmHg Left UE HR= 79 bpm ? ? ? ?BP= 160/85 mmHg Left UE standing  ? ?*Patient had brief discussion about BPPV with vestibular PT- Ricard Dillon, PT and she reports based on his symptoms that he may benefit from vestibular screen/assessment due to his symptoms ? ?Neuromuscular re-ed: ? ?Side steps over 1/2 foam x 20 reps - attempting without UE support - increased unsteadiness with intermittent UE touch on support bar. ? ? ?Forward/backward over 1/2 foam- x 20 reps - much more difficulty with retro steps requiring some UE support at times and difficulty with foot clearance over foam.  ? ? ?Step up and opp LE high knee with contralateral UE x 12 reps each LE. Difficulty coordinating movement requiring multiple VC for cadence.  ? ?Static stand on A/P rocker board - hold flat and equal x 30 sec x 4 trials.  ?A/P rockerboard- x 25 reps x 2 sets- 1st set difficulty with posterior weight shift without reaching with UE but vastly improved on 2nd set.  ?Education provided throughout session via VC/TC and demonstration to facilitate movement at target joints and correct muscle activation  for all testing and exercises performed.  ? ? ? ? ? ? ? ? ? ? ? ? ? ? ? ? ? ? ? ? ? PT Short Term Goals - 08/01/21 1347   ? ?  ? PT SHORT TERM GOAL #1  ? Title Pt will be independent with initial  HEP in order to improve strength and balance in order to decrease fall risk and improve function at home and work.   ? Baseline 06/15/2021- No formal HEP in place; 08/01/2021- Patient verbalized knowledge of initial HEP- inlcuidng walking and balance.   ? Time 6   ? Period Weeks   ? Status Achieved   ? Target Date 07/27/21   ? ?  ?  ? ?  ? ? ? ? PT Long Term Goals - 08/01/21  1348   ? ?  ? PT LONG TERM GOAL #1  ? Title Pt will be independent with Final HEP in order to improve strength and balance in order to decrease fall risk and improve function at home and work.   ? Baseline 2/16= No formal HEP in place 08/01/2021= See short term goal section   ? Time 12   ? Period Weeks   ? Status On-going   ? Target Date 09/07/21   ?  ? PT LONG TERM GOAL #2  ? Title Pt will improve FOTO to target score of 64 to display perceived improvements in ability to complete ADL's.   ? Baseline 06/15/2021= 57: 08/01/4021= 69   ? Time 12   ? Period Weeks   ? Status Achieved   ? Target Date 09/07/21   ?  ? PT LONG TERM GOAL #3  ? Title Pt will improve BERG by at least 3 points in order to demonstrate clinically significant improvement in balance.   ? Baseline 06/15/2021= 48/56: 08/01/2021= 50/56   ? Time 12   ? Period Weeks   ? Status On-going   ? Target Date 09/07/21   ?  ? PT LONG TERM GOAL #4  ? Title Pt will decrease 5TSTS by at least 3 seconds in order to demonstrate clinically significant improvement in LE strength   ? Baseline 06/16/2021= 23.0 sec without UE support.  08/01/2021= 14.14 sec without UE support   ? Time 12   ? Period Weeks   ? Status Achieved   ? Target Date 09/07/21   ?  ? PT LONG TERM GOAL #5  ? Title Pt will increase 10MWT by at least 0.13 m/s in order to demonstrate clinically significant improvement in community ambulation.   ? Baseline  06/15/2021= 0.81; 08/01/2021= 0.90 m/s   ? Time 12   ? Period Weeks   ? Status On-going   ? Target Date 09/07/21   ?  ? PT LONG TERM GOAL #6  ? Title Pt will decrease TUG to below 14 seconds/decrease

## 2021-08-14 ENCOUNTER — Encounter: Payer: Self-pay | Admitting: Physical Therapy

## 2021-08-14 ENCOUNTER — Ambulatory Visit: Payer: Medicare Other | Admitting: Physical Therapy

## 2021-08-14 VITALS — BP 163/81 | HR 69

## 2021-08-14 DIAGNOSIS — R262 Difficulty in walking, not elsewhere classified: Secondary | ICD-10-CM

## 2021-08-14 DIAGNOSIS — R269 Unspecified abnormalities of gait and mobility: Secondary | ICD-10-CM | POA: Diagnosis not present

## 2021-08-14 DIAGNOSIS — M6281 Muscle weakness (generalized): Secondary | ICD-10-CM

## 2021-08-14 DIAGNOSIS — R278 Other lack of coordination: Secondary | ICD-10-CM

## 2021-08-14 DIAGNOSIS — R2681 Unsteadiness on feet: Secondary | ICD-10-CM

## 2021-08-14 NOTE — Therapy (Signed)
[facility] ?[place], [place], [address] ?Phone: [phone]   Fax:  [phone] ? ?Physical Therapy Treatment ? ?Patient Details  ?Name: Victor Gould ?MRN: [id number] ?Date of Birth: Sep 26, 1940 ?Referring Provider (PT): Dr. Manuella Ghazi ? ? ?Encounter Date: 08/14/2021 ? ? PT End of Session - 08/14/21 1309   ? ? Visit Number 14   ? Number of Visits 25   ? Date for PT Re-Evaluation 09/07/21   ? Authorization Time Period Initial cert 2/70/3500- 9/38/1829   ? Progress Note Due on Visit 20   ? PT Start Time 1304   ? PT Stop Time 9371   ? PT Time Calculation (min) 41 min   ? Equipment Utilized During Treatment Gait belt   ? Activity Tolerance Patient tolerated treatment well;No increased pain   ? Behavior During Therapy [facility] for tasks assessed/performed   ? ?  ?  ? ?  ? ? ?Past Medical History:  ?Diagnosis Date  ? ASCVD (arteriosclerotic cardiovascular disease)   ? 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 Chronic kidney disease   ? Colon polyps   ? Constipation   ? Coronary artery disease   ? Diabetes mellitus   ? Family history of adverse reaction to anesthesia   ? children - PONV  ? GERD (gastroesophageal reflux disease)   ? Hemorrhoids   ? Hyperlipidemia   ? Hypertension   ? PCP [name] at [place]  ? Hypothyroidism   ? MI (myocardial infarction) ([place])   ? PONV (postoperative nausea and vomiting)   ? Thyroid disease   ? Vertigo   ? positional  ? ? ?Past Surgical History:  ?Procedure Laterality Date  ? APPENDECTOMY    ? CATARACT EXTRACTION W/PHACO Right 10/27/2018  ? Procedure: CATARACT EXTRACTION PHACO AND INTRAOCULAR LENS PLACEMENT (IOC) RIGHT, DIABETIC;  Surgeon: [name], MD;  Location: [place];  Service: Ophthalmology;  Laterality: Right;  diabetic - insulin  ? CATARACT EXTRACTION W/PHACO Left 11/25/2018  ? Procedure: CATARACT EXTRACTION PHACO AND INTRAOCULAR LENS PLACEMENT ([place]) LEFT DIABETIC;  Surgeon: [name], MD;  Location:  [place];  Service: Ophthalmology;  Laterality: Left;  ? COLONOSCOPY    ? colonoscopy with polypectomy    ? COLONOSCOPY WITH PROPOFOL N/A 12/02/2017  ? Procedure: COLONOSCOPY WITH PROPOFOL;  Surgeon: [name], MD;  Location: [facility] ENDOSCOPY;  Service: Endoscopy;  Laterality: N/A;  ? CORONARY ARTERY BYPASS GRAFT N/A 08/28/2016  ? Procedure: CORONARY ARTERY BYPASS GRAFTING (CABG) x five, using left internal mammary artery and right leg greater saphenous vein harvested endoscopically - -LIMA to LAD, -SVG to OM, -SVG to DIAGONAL, -SEQ SVG to PDA, PLVB ;  Surgeon: [name], MD;  Location: [place];  Service: Open Heart Surgery;  Laterality: N/A;  ? CYSTOSCOPY N/A 09/29/2013  ? Procedure: CYSTOSCOPY;  Surgeon: [name], MD;  [facility]: [facility];  Service: Urology;  Laterality: N/A;  ? ESOPHAGOGASTRODUODENOSCOPY (EGD) WITH PROPOFOL N/A 12/02/2017  ? Procedure: ESOPHAGOGASTRODUODENOSCOPY (EGD) WITH PROPOFOL;  Surgeon: [name], MD;  Location: [facility] ENDOSCOPY;  Service: Endoscopy;  Laterality: N/A;  ? HERNIA REPAIR    ? inguinal bilaterally  ? LEFT HEART CATH AND CORONARY ANGIOGRAPHY N/A 08/27/2016  ? Procedure: Left Heart Cath and Coronary Angiography;  Surgeon: [name], MD;  Location: [place] CV LAB;  Service: Cardiovascular;  Laterality: N/A;  ? ROBOT ASSISTED LAPAROSCOPIC RADICAL PROSTATECTOMY  06/25/2011  ? Procedure: ROBOTIC  ASSISTED LAPAROSCOPIC RADICAL PROSTATECTOMY LEVEL 2;  Surgeon: [name], MD;  [facility]: [facility];  Service: Urology;  Laterality: N/A;  ? TEE WITHOUT CARDIOVERSION N/A 08/28/2016  ? Procedure: TRANSESOPHAGEAL ECHOCARDIOGRAM (TEE);  Surgeon: [name], MD;  Location: [place];  Service: Open Heart Surgery;  Laterality: N/A;  ? UPPER GASTROINTESTINAL ENDOSCOPY    ? URETHRAL SLING N/A 09/29/2013  ? Procedure: MALE SLING;  Surgeon: [name], MD;  Location: [facility];  Service: Urology;  Laterality: N/A;  ? ? ?Vitals:  ? 08/14/21 1310  ?BP: (!) 163/81   ?Pulse: 69  ? ? ? Subjective Assessment - 08/14/21 1307   ? ? Subjective "I am doing okay. I have been changing my BP medicine and has been slowly increasing medication."  He reports continued intermittent dizzines, no change to dizziness despite BP issues;   ? Pertinent History Patient is a 81 year old male with diagnosis of Parkinsons referred to outpatient PT for difficulty with balance  with recent history of falling. He reports 3 falls in past 6 months. He states he does not use any assistive device and noticing more shuffling nature of walkng. 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   ? Limitations Lifting;Standing;Walking;House hold activities   ? How long can you sit comfortably? no issues   ? How long can you stand comfortably? <1 hour   ? How long can you walk comfortably? <30 min   ? Patient Stated Goals My main goal is to improve my balance so I don't fall.   ? Currently in Pain? No/denies   ? ?  ?  ? ?  ? ? ? ? ?   ?Neuromuscular re-ed: ?  ?Standing doorway stretch, 20 sec hold x2 reps with min VCs for proper positioning ?Arms extended BUE finger flicks C58 reps with cues for increased finger extension; ? ?Standing with feet staggered: ?Forward/backward weight shift with UE arms swing to challenge dual task with UE/LE movement x15 reps each foot in front with mod VCs and demonstration for proper positioning; ? ? ?Side steps over 1/2 foam x 10 reps unsupported, minimal difficulty required min VCS for increased step length for better foot clearance; ?Progressed to stepping over 1/2 bolster with reaching overhead in diagonal to challenge dual task x5 reps each direction, CGA for safety ? ?Forward step over 1/2 bolster with BUE clap x15 reps each LE with cues for sequencing; Patient often stepping and clapping together due to instability with standing in staggered step position;   ?  ?Instructed patient in ladder drills: ?Forward walking with contralateral SLS 3 sec hold x2  laps with CGA for safety ?Side stepping while holding small ball chest press/pulling ball to chest to challenge dual task x2 laps each with increased difficulty going to left side; ?Forward step non-reciprocal with ball raise when stepping forward and then lowering down when stepping together x2 laps each foot in front;  ? ?BP after ladder drills 180/82 ? ? ?Patient tolerated session well. He does exhibit increased difficulty with dual task requiring increased cues/demonstration for proper sequencing and technique;  ? ? ? ? ? ? ? ? ? ? ? ? ? ? ? PT Education - 08/14/21 1309   ? ? Education Details postural exercise, positioning/dual task   ? Person(s) Educated Patient   ? Methods Explanation;Verbal cues   ? Comprehension Verbalized understanding;Returned demonstration;Verbal cues required;Need further instruction   ? ?  ?  ? ?  ? ? ?  PT Short Term Goals - 08/01/21 1347   ? ?  ? PT SHORT TERM GOAL #1  ? Title Pt will be independent with initial  HEP in order to improve strength and balance in order to decrease fall risk and improve function at home and work.   ? Baseline 06/15/2021- No formal HEP in place; 08/01/2021- Patient verbalized knowledge of initial HEP- inlcuidng walking and balance.   ? Time 6   ? Period Weeks   ? Status Achieved   ? Target Date 07/27/21   ? ?  ?  ? ?  ? ? ? ? PT Long Term Goals - 08/01/21 1348   ? ?  ? PT LONG TERM GOAL #1  ? Title Pt will be independent with Final HEP in order to improve strength and balance in order to decrease fall risk and improve function at home and work.   ? Baseline 2/16= No formal HEP in place 08/01/2021= See short term goal section   ? Time 12   ? Period Weeks   ? Status On-going   ? Target Date 09/07/21   ?  ? PT LONG TERM GOAL #2  ? Title Pt will improve FOTO to target score of 64 to display perceived improvements in ability to complete ADL's.   ? Baseline 06/15/2021= 57: 08/01/4021= 69   ? Time 12   ? Period Weeks   ? Status Achieved   ? Target Date 09/07/21   ?  ? PT  LONG TERM GOAL #3  ? Title Pt will improve BERG by at least 3 points in order to demonstrate clinically significant improvement in balance.   ? Baseline 06/15/2021= 48/56: 08/01/2021= 50/56   ? Time 12   ?

## 2021-08-16 ENCOUNTER — Ambulatory Visit: Payer: Medicare Other

## 2021-08-16 DIAGNOSIS — R278 Other lack of coordination: Secondary | ICD-10-CM

## 2021-08-16 DIAGNOSIS — R2681 Unsteadiness on feet: Secondary | ICD-10-CM

## 2021-08-16 DIAGNOSIS — R269 Unspecified abnormalities of gait and mobility: Secondary | ICD-10-CM | POA: Diagnosis not present

## 2021-08-16 DIAGNOSIS — M6281 Muscle weakness (generalized): Secondary | ICD-10-CM

## 2021-08-16 DIAGNOSIS — R262 Difficulty in walking, not elsewhere classified: Secondary | ICD-10-CM

## 2021-08-16 NOTE — Therapy (Signed)
[place] [facility] ?[place], [place], [address] ?Phone: [phone]   Fax:  [phone] ? ?Physical Therapy Treatment ? ?Patient Details  ?Name: Victor Gould ?MRN: [id number] ?Date of Birth: [birth year] ?Referring Provider (PT): Dr. Manuella Ghazi ? ? ?Encounter Date: 08/16/2021 ? ? PT End of Session - 08/16/21 1308   ? ? Visit Number 15   ? Number of Visits 25   ? Date for PT Re-Evaluation 09/07/21   ? Authorization Time Period Initial cert 6/73/4193- 7/90/2409   ? Progress Note Due on Visit 20   ? PT Start Time 1304   ? PT Stop Time 1340   ? PT Time Calculation (min) 36 min   ? Equipment Utilized During Treatment Gait belt   ? Activity Tolerance Patient tolerated treatment well;No increased pain   ? Behavior During Therapy [facility] for tasks assessed/performed   ? ?  ?  ? ?  ? ? ?Past Medical History:  ?Diagnosis Date  ? ASCVD (arteriosclerotic cardiovascular disease)   ? Cancer [facility])   ? prostate  ? Chronic kidney disease   ? Colon polyps   ? Constipation   ? Coronary artery disease   ? Diabetes mellitus   ? Family history of adverse reaction to anesthesia   ? children - PONV  ? GERD (gastroesophageal reflux disease)   ? Hemorrhoids   ? Hyperlipidemia   ? Hypertension   ? PCP [name] at [place]  ? Hypothyroidism   ? MI (myocardial infarction) ([place])   ? PONV (postoperative nausea and vomiting)   ? Thyroid disease   ? Vertigo   ? positional  ? ? ?Past Surgical History:  ?Procedure Laterality Date  ? APPENDECTOMY    ? CATARACT EXTRACTION W/PHACO Right 10/27/2018  ? Procedure: CATARACT EXTRACTION PHACO AND INTRAOCULAR LENS PLACEMENT (IOC) RIGHT, DIABETIC;  Surgeon: [name], MD;  Location: [place];  Service: Ophthalmology;  Laterality: Right;  diabetic - insulin  ? CATARACT EXTRACTION W/PHACO Left 11/25/2018  ? Procedure: CATARACT EXTRACTION PHACO AND INTRAOCULAR LENS PLACEMENT ([place]) LEFT DIABETIC;  Surgeon: [name], MD;  Location:  [place];  Service: Ophthalmology;  Laterality: Left;  ? COLONOSCOPY    ? colonoscopy with polypectomy    ? COLONOSCOPY WITH PROPOFOL N/A 12/02/2017  ? Procedure: COLONOSCOPY WITH PROPOFOL;  Surgeon: [name], MD;  Location: [facility] ENDOSCOPY;  Service: Endoscopy;  Laterality: N/A;  ? CORONARY ARTERY BYPASS GRAFT N/A 08/28/2016  ? Procedure: CORONARY ARTERY BYPASS GRAFTING (CABG) x five, using left internal mammary artery and right leg greater saphenous vein harvested endoscopically - -LIMA to LAD, -SVG to OM, -SVG to DIAGONAL, -SEQ SVG to PDA, PLVB ;  Surgeon: [name], MD;  Location: [place];  Service: Open Heart Surgery;  Laterality: N/A;  ? CYSTOSCOPY N/A 09/29/2013  ? Procedure: CYSTOSCOPY;  Surgeon: [name], MD;  [facility];  Service: Urology;  Laterality: N/A;  ? ESOPHAGOGASTRODUODENOSCOPY (EGD) WITH PROPOFOL N/A 12/02/2017  ? Procedure: ESOPHAGOGASTRODUODENOSCOPY (EGD) WITH PROPOFOL;  Surgeon: [name], MD;  Location: [facility] ENDOSCOPY;  Service: Endoscopy;  Laterality: N/A;  ? HERNIA REPAIR    ? inguinal bilaterally  ? LEFT HEART CATH AND CORONARY ANGIOGRAPHY N/A 08/27/2016  ? Procedure: Left Heart Cath and Coronary Angiography;  Surgeon: [name], MD;  Location: [place] [facility];  Service: Cardiovascular;  Laterality: N/A;  ? ROBOT ASSISTED LAPAROSCOPIC RADICAL PROSTATECTOMY  06/25/2011  ? Procedure: ROBOTIC  ASSISTED LAPAROSCOPIC RADICAL PROSTATECTOMY LEVEL 2;  Surgeon: [name], MD;  [facility];  Service: Urology;  Laterality: N/A;  ? TEE WITHOUT CARDIOVERSION N/A 08/28/2016  ? Procedure: TRANSESOPHAGEAL ECHOCARDIOGRAM (TEE);  Surgeon: [name], MD;  Location: [place];  Service: Open Heart Surgery;  Laterality: N/A;  ? UPPER GASTROINTESTINAL ENDOSCOPY    ? URETHRAL SLING N/A 09/29/2013  ? Procedure: MALE SLING;  Surgeon: [name], MD;  [facility];  Service: Urology;  Laterality: N/A;  ? ? ?There were no vitals filed for this visit. ? ?  Subjective Assessment - 08/16/21 1306   ? ? Subjective "I'm still having some dizziness maybe not quite as prominent."   ? Pertinent History Patient is a 81 year old male with diagnosis of Parkinsons referred to outpatient PT for difficulty with balance  with recent history of falling. He reports 3 falls in past 6 months. He states he does not use any assistive device and noticing more shuffling nature of walkng. 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   ? Limitations Lifting;Standing;Walking;House hold activities   ? How long can you sit comfortably? no issues   ? How long can you stand comfortably? <1 hour   ? How long can you walk comfortably? <30 min   ? Patient Stated Goals My main goal is to improve my balance so I don't fall.   ? Currently in Pain? No/denies   ? ?  ?  ? ?  ? ? ? ? ?INTERVENTIONS ? ?Interventions today provided by Lady Deutscher, PT, DPT ? ?OCULOMOTOR / VESTIBULAR TESTING: ?Oculomotor Exam- Room Light ? Normal Abnormal Comments  ?Ocular Alignment N    ?Ocular ROM N    ?Spontaneous Nystagmus N    ?Gaze evoked Nystagmus N    ?Smooth Pursuit  Abn Multiple saccadic corrections noted  ?Saccades  Abn Slow speed and hypometric saccades noted  ?VOR   Deferred secondary to nausea and dizziness  ?VOR Cancellation   Deferred secondary to nausea and dizziness  ?Left Head Impulse   Deferred secondary to nausea and dizziness  ?Right Head Impulse   Deferred secondary to nausea and dizziness  ?Head Shaking Nystagmus   Deferred secondary to nausea and dizziness  ?Static Acuity   Deferred secondary to nausea and dizziness  ?Dynamic Acuity   Deferred secondary to nausea and dizziness  ? ?BPPV TESTS: ? Symptoms Duration Intensity Nystagmus  ?Left Dix-Hallpike None   None observed  ?Right Dix-Hallpike Pt reports dizziness Greater than 1 minute ?medium? on 0-10 scale None observed  ? ?Canalith Repositioning Maneuver: ? ?On inverted mat table, performed right Dix-Hallpike testing and  it was potentially positive with no  nystagmus observed, but pt reported reproduction of dizziness symptoms. Performed one right canalith repositioning maneuver (CRT). Note: pt reported increase in symptoms in right head turn positions and then continued dizziness in left head turn and left side lying positions as well as with sitting upright. Pt reports mild nausea upon sitting as well.  ?Pt would benefit from further vestibular assessment and repeat right canal testing next session as tolerated. ? ? ? ? ? ? ? ? ? ? ? ? ? ? ? ? ? ? ? ? PT Education - 08/17/21 1322   ? ? Education Details vestibular education   ? Person(s) Educated Patient   ? Methods Explanation;Demonstration;Tactile cues;Verbal cues   ? Comprehension Verbalized understanding;Returned demonstration;Verbal cues required;Tactile cues required;Need further instruction   ? ?  ?  ? ?  ? ? ?  PT Short Term Goals - 08/01/21 1347   ? ?  ? PT SHORT TERM GOAL #1  ? Title Pt will be independent with initial  HEP in order to improve strength and balance in order to decrease fall risk and improve function at home and work.   ? Baseline 06/15/2021- No formal HEP in place; 08/01/2021- Patient verbalized knowledge of initial HEP- inlcuidng walking and balance.   ? Time 6   ? Period Weeks   ? Status Achieved   ? Target Date 07/27/21   ? ?  ?  ? ?  ? ? ? ? PT Long Term Goals - 08/01/21 1348   ? ?  ? PT LONG TERM GOAL #1  ? Title Pt will be independent with Final HEP in order to improve strength and balance in order to decrease fall risk and improve function at home and work.   ? Baseline 2/16= No formal HEP in place 08/01/2021= See short term goal section   ? Time 12   ? Period Weeks   ? Status On-going   ? Target Date 09/07/21   ?  ? PT LONG TERM GOAL #2  ? Title Pt will improve FOTO to target score of 64 to display perceived improvements in ability to complete ADL's.   ? Baseline 06/15/2021= 57: 08/01/4021= 69   ? Time 12   ? Period Weeks   ? Status Achieved   ? Target  Date 09/07/21   ?  ? PT LONG TERM GOAL #3  ? Title Pt will improve BERG by at least 3 points in order to demonstrate clinically significant improvement in balance.   ? Baseline 06/15/2021= 48/56: 08/02/18

## 2021-08-21 ENCOUNTER — Ambulatory Visit: Payer: Medicare Other | Admitting: Physical Therapy

## 2021-08-21 ENCOUNTER — Encounter: Payer: Self-pay | Admitting: Physical Therapy

## 2021-08-21 DIAGNOSIS — R2681 Unsteadiness on feet: Secondary | ICD-10-CM

## 2021-08-21 DIAGNOSIS — R269 Unspecified abnormalities of gait and mobility: Secondary | ICD-10-CM

## 2021-08-21 DIAGNOSIS — R278 Other lack of coordination: Secondary | ICD-10-CM

## 2021-08-21 DIAGNOSIS — M6281 Muscle weakness (generalized): Secondary | ICD-10-CM

## 2021-08-21 DIAGNOSIS — R262 Difficulty in walking, not elsewhere classified: Secondary | ICD-10-CM

## 2021-08-21 NOTE — Patient Instructions (Addendum)
Access Code: [email or web address] ?URL: [email or web address] ?Date: 08/21/2021 ?Prepared by: Blanche East ? ?Exercises ?- Seated Gaze Stabilization with Head Rotation  - 3 x daily - 7 x weekly - 1 sets - 3 reps - 30 sec hold ?- Seated Finger Flicks with Elbow Extension  - 3 x daily - 7 x weekly - 1-2 sets - 10 reps ?

## 2021-08-21 NOTE — Therapy (Signed)
[facility] ?[place], [place], [address] ?Phone: [phone]   Fax:  [phone] ? ?Physical Therapy Treatment ? ?Patient Details  ?Name: Victor Gould ?MRN: [id number] ?Date of Birth: [birth year] ?Referring Provider (PT): Dr. Manuella Ghazi ? ? ?Encounter Date: 08/21/2021 ? ? PT End of Session - 08/21/21 1311   ? ? Visit Number 16   ? Number of Visits 25   ? Date for PT Re-Evaluation 09/07/21   ? Authorization Time Period Initial cert 6/96/2952- 8/41/3244   ? Progress Note Due on Visit 20   ? PT Start Time 1304   ? PT Stop Time 0102   ? PT Time Calculation (min) 41 min   ? Equipment Utilized During Treatment Gait belt   ? Activity Tolerance Patient tolerated treatment well;No increased pain   ? Behavior During Therapy [facility] for tasks assessed/performed   ? ?  ?  ? ?  ? ? ?Past Medical History:  ?Diagnosis Date  ? ASCVD (arteriosclerotic cardiovascular disease)   ? 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 Chronic kidney disease   ? Colon polyps   ? Constipation   ? Coronary artery disease   ? Diabetes mellitus   ? Family history of adverse reaction to anesthesia   ? children - PONV  ? GERD (gastroesophageal reflux disease)   ? Hemorrhoids   ? Hyperlipidemia   ? Hypertension   ? PCP [name] at [place]  ? Hypothyroidism   ? MI (myocardial infarction) ([place])   ? PONV (postoperative nausea and vomiting)   ? Thyroid disease   ? Vertigo   ? positional  ? ? ?Past Surgical History:  ?Procedure Laterality Date  ? APPENDECTOMY    ? CATARACT EXTRACTION W/PHACO Right 10/27/2018  ? Procedure: CATARACT EXTRACTION PHACO AND INTRAOCULAR LENS PLACEMENT (IOC) RIGHT, DIABETIC;  Surgeon: [name], MD;  Location: [place];  Service: Ophthalmology;  Laterality: Right;  diabetic - insulin  ? CATARACT EXTRACTION W/PHACO Left 11/25/2018  ? Procedure: CATARACT EXTRACTION PHACO AND INTRAOCULAR LENS PLACEMENT ([place]) LEFT DIABETIC;  Surgeon: [name], MD;  Location:  [place];  Service: Ophthalmology;  Laterality: Left;  ? COLONOSCOPY    ? colonoscopy with polypectomy    ? COLONOSCOPY WITH PROPOFOL N/A 12/02/2017  ? Procedure: COLONOSCOPY WITH PROPOFOL;  Surgeon: [name], MD;  Location: [facility] END[facility]COPY;  Service: Endoscopy;  Laterality: N/A;  ? CORONARY ARTERY BYPASS GRAFT N/A 08/28/2016  ? Procedure: CORONARY ARTERY BYPASS GRAFTING (CABG) x five, using left internal mammary artery and right leg greater saphenous vein harvested endoscopically - -LIMA to LAD, -SVG to OM, -SVG to DIAGONAL, -SEQ SVG to PDA, PLVB ;  Surgeon: [name], MD;  Location: [place];  Service: Open Heart Surgery;  Laterality: N/A;  ? CYSTOSCOPY N/A 09/29/2013  ? Procedure: CYSTOSCOPY;  Surgeon: [name], MD;  [facility]: [facility];  Service: Urology;  Laterality: N/A;  ? ESOPHAGOGASTRODUODENOSCOPY (EGD) WITH PROPOFOL N/A 12/02/2017  ? Procedure: ESOPHAGOGASTRODUODENOSCOPY (EGD) WITH PROPOFOL;  Surgeon: [name], MD;  Location: [facility] ENDOSCOPY;  Service: Endoscopy;  Laterality: N/A;  ? HERNIA REPAIR    ? inguinal bilaterally  ? LEFT HEART CATH AND CORONARY ANGIOGRAPHY N/A 08/27/2016  ? Procedure: Left Heart Cath and Coronary Angiography;  Surgeon: [name], MD;  Location: [place] [facility];  Service: Cardiovascular;  Laterality: N/A;  ? ROBOT ASSISTED LAPAROSCOPIC RADICAL PROSTATECTOMY  06/25/2011  ? Procedure: ROBOTIC  ASSISTED LAPAROSCOPIC RADICAL PROSTATECTOMY LEVEL 2;  Surgeon: [name], MD;  [facility]: [facility];  Service: Urology;  Laterality: N/A;  ? TEE WITHOUT CARDIOVERSION N/A 08/28/2016  ? Procedure: TRANSESOPHAGEAL ECHOCARDIOGRAM (TEE);  Surgeon: [name], MD;  Location: [place];  Service: Open Heart Surgery;  Laterality: N/A;  ? UPPER GASTROINTESTINAL ENDOSCOPY    ? URETHRAL SLING N/A 09/29/2013  ? Procedure: MALE SLING;  Surgeon: [name], MD;  [facility];  Service: Urology;  Laterality: N/A;  ? ? ?There were no vitals filed for this visit. ? ?  Subjective Assessment - 08/21/21 1310   ? ? Subjective Patient reports no change in dizziness after last session. He reports having a bad week with increased dizziness and not feeling well;   ? Pertinent History Patient is a 81 year old male with diagnosis of Parkinsons referred to outpatient PT for difficulty with balance  with recent history of falling. He reports 3 falls in past 6 months. He states he does not use any assistive device and noticing more shuffling nature of walkng. 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   ? Limitations Lifting;Standing;Walking;House hold activities   ? How long can you sit comfortably? no issues   ? How long can you stand comfortably? <1 hour   ? How long can you walk comfortably? <30 min   ? Patient Stated Goals My main goal is to improve my balance so I don't fall.   ? Currently in Pain? No/denies   ? ?  ?  ? ?  ? ? ? ? ? ? ? ?INTERVENTIONS ? ?   ?Neuromuscular re-ed: ?   ?Instructed patient in dynamic balance with gait in hallway: ?Forward/backward walking with head turns side/side calling out cards on wall x80 feet  ?  Patient exhibits increased shuffle with decreased step length with backward walking compared to forward walking ?Forward walking tossing/catching ball x50 feet with CGA for safety ?Side stepping tossing ball back and forth with therapist x30 feet x1 set each direction; Pt exhibits increased instability when side stepping to right and reports increased dizziness; ?Forward walking bouncing ball and catching x50 feet ? ?Patient denies any significant increase in dizziness symptoms with advanced exercise. He does continue to exhibit forward head and slumped posture requiring cues for erect posture. He also exhibits increased difficulty managing ball with BUE with decreased finger/hand movement; ? ?Instructed patient in seated finger flicks as part of HEP;   ?  ?  ?Interventions today provided by Lady Deutscher, PT, DPT ? ?Pt reports  no changes in his dizziness symptoms since last session.  ? ?Dix-Hallpike: ?Performed right Dix-Hallpike test on inverted mat table and no nystagmus was observed and pt denied vertigo. Pt did report persistent 4/10 dizziness and neck discomfort. ? ?VOR X 1 exercise:  ?Demonstrated and educated as to VOR X1.  ?Patient performed VOR X 1 horizontal in sitting 3 reps of 30 seconds each with verbal cues for technique.  ?Patient reports the target is staying in focus and reports 5/10 dizziness. Issued VOR X 1 horizontal with plain background in sitting for HEP. ? ? ? ? ? ? ? ? ? ? ? ? PT Education - 08/21/21 1311   ? ? Education Details vestibular education/balance/HEP   ? Person(s) Educated Patient   ? Methods Explanation;Verbal cues   ? Comprehension Verbalized understanding;Returned demonstration;Verbal cues required;Need further instruction   ? ?  ?  ? ?  ? ? ?  PT Short Term Goals - 08/01/21 1347   ? ?  ? PT SHORT TERM GOAL #1  ? Title Pt will be independent with initial  HEP in order to improve strength and balance in order to decrease fall risk and improve function at home and work.   ? Baseline 06/15/2021- No formal HEP in place; 08/01/2021- Patient verbalized knowledge of initial HEP- inlcuidng walking and balance.   ? Time 6   ? Period Weeks   ? Status Achieved   ? Target Date 07/27/21   ? ?  ?  ? ?  ? ? ? ? PT Long Term Goals - 08/01/21 1348   ? ?  ? PT LONG TERM GOAL #1  ? Title Pt will be independent with Final HEP in order to improve strength and balance in order to decrease fall risk and improve function at home and work.   ? Baseline 2/16= No formal HEP in place 08/01/2021= See short term goal section   ? Time 12   ? Period Weeks   ? Status On-going   ? Target Date 09/07/21   ?  ? PT LONG TERM GOAL #2  ? Title Pt will improve FOTO to target score of 64 to display perceived improvements in ability to complete ADL's.   ? Baseline 06/15/2021= 57: 08/01/4021= 69   ? Time 12   ? Period Weeks   ? Status Achieved   ?  Target Date 09/07/21   ?  ? PT LONG TERM GOAL #3  ? Title Pt will improve BERG by at least 3 points in order to demonstrate clinically significant improvement in balance.   ? Baseline 06/15/2021= 48/56: 4

## 2021-08-23 ENCOUNTER — Ambulatory Visit: Payer: Medicare Other

## 2021-08-24 ENCOUNTER — Ambulatory Visit: Payer: Medicare Other

## 2021-08-24 DIAGNOSIS — R262 Difficulty in walking, not elsewhere classified: Secondary | ICD-10-CM

## 2021-08-24 DIAGNOSIS — R42 Dizziness and giddiness: Secondary | ICD-10-CM

## 2021-08-24 DIAGNOSIS — R2681 Unsteadiness on feet: Secondary | ICD-10-CM

## 2021-08-24 DIAGNOSIS — R278 Other lack of coordination: Secondary | ICD-10-CM

## 2021-08-24 DIAGNOSIS — R269 Unspecified abnormalities of gait and mobility: Secondary | ICD-10-CM | POA: Diagnosis not present

## 2021-08-24 NOTE — Therapy (Signed)
[place] [facility] ?[place], [place], [address] ?Phone: [phone]   Fax:  [phone] ? ?Physical Therapy Treatment ? ?Patient Details  ?Name: Victor Gould ?MRN: [id number] ?Date of Birth: Feb 07, 1941 ?Referring Provider (PT): Dr. Manuella Ghazi ? ? ?Encounter Date: 08/24/2021 ? ? PT End of Session - 08/24/21 1705   ? ? Visit Number 17   ? Number of Visits 25   ? Date for PT Re-Evaluation 09/07/21   ? Authorization Time Period Initial cert 8/93/7342- 8/76/8115   ? Progress Note Due on Visit 20   ? PT Start Time 7262   ? PT Stop Time 0355   ? PT Time Calculation (min) 41 min   ? Equipment Utilized During Treatment Gait belt   ? Activity Tolerance Patient tolerated treatment well;No increased pain   ? Behavior During Therapy [facility] for tasks assessed/performed   ? ?  ?  ? ?  ? ? ?Past Medical History:  ?Diagnosis Date  ? ASCVD (arteriosclerotic cardiovascular disease)   ? 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 Chronic kidney disease   ? Colon polyps   ? Constipation   ? Coronary artery disease   ? Diabetes mellitus   ? Family history of adverse reaction to anesthesia   ? children - PONV  ? GERD (gastroesophageal reflux disease)   ? Hemorrhoids   ? Hyperlipidemia   ? Hypertension   ? PCP [name] at [place]  ? Hypothyroidism   ? MI (myocardial infarction) ([place])   ? PONV (postoperative nausea and vomiting)   ? Thyroid disease   ? Vertigo   ? positional  ? ? ?Past Surgical History:  ?Procedure Laterality Date  ? APPENDECTOMY    ? CATARACT EXTRACTION W/PHACO Right 10/27/2018  ? Procedure: CATARACT EXTRACTION PHACO AND INTRAOCULAR LENS PLACEMENT (IOC) RIGHT, DIABETIC;  Surgeon: [name], MD;  Location: [place];  Service: Ophthalmology;  Laterality: Right;  diabetic - insulin  ? CATARACT EXTRACTION W/PHACO Left 11/25/2018  ? Procedure: CATARACT EXTRACTION PHACO AND INTRAOCULAR LENS PLACEMENT ([place]) LEFT DIABETIC;  Surgeon: [name], MD;  Location:  [place];  Service: Ophthalmology;  Laterality: Left;  ? COLONOSCOPY    ? colonoscopy with polypectomy    ? COLONOSCOPY WITH PROPOFOL N/A 12/02/2017  ? Procedure: COLONOSCOPY WITH PROPOFOL;  Surgeon: [name], MD;  Location: [facility] ENDOSCOPY;  Service: Endoscopy;  Laterality: N/A;  ? CORONARY ARTERY BYPASS GRAFT N/A 08/28/2016  ? Procedure: CORONARY ARTERY BYPASS GRAFTING (CABG) x five, using left internal mammary artery and right leg greater saphenous vein harvested endoscopically - -LIMA to LAD, -SVG to OM, -SVG to DIAGONAL, -SEQ SVG to PDA, PLVB ;  Surgeon: [name], MD;  Location: [place];  Service: Open Heart Surgery;  Laterality: N/A;  ? CYSTOSCOPY N/A 09/29/2013  ? Procedure: CYSTOSCOPY;  Surgeon: [name], MD;  [facility];  Service: Urology;  Laterality: N/A;  ? ESOPHAGOGASTRODUODENOSCOPY (EGD) WITH PROPOFOL N/A 12/02/2017  ? Procedure: ESOPHAGOGASTRODUODENOSCOPY (EGD) WITH PROPOFOL;  Surgeon: [name], MD;  Location: [facility]COPY;  Service: Endoscopy;  Laterality: N/A;  ? HERNIA REPAIR    ? inguinal bilaterally  ? LEFT HEART CATH AND CORONARY ANGIOGRAPHY N/A 08/27/2016  ? Procedure: Left Heart Cath and Coronary Angiography;  Surgeon: [name], MD;  Location: [place] [facility] LAB;  Service: Cardiovascular;  Laterality: N/A;  ? ROBOT ASSISTED LAPAROSCOPIC RADICAL PROSTATECTOMY  06/25/2011  ? Procedure: ROBOTIC  ASSISTED LAPAROSCOPIC RADICAL PROSTATECTOMY LEVEL 2;  Surgeon: [name], MD;  [facility];  Service: Urology;  Laterality: N/A;  ? TEE WITHOUT CARDIOVERSION N/A 08/28/2016  ? Procedure: TRANSESOPHAGEAL ECHOCARDIOGRAM (TEE);  Surgeon: [name], MD;  Location: [place];  Service: Open Heart Surgery;  Laterality: N/A;  ? UPPER GASTROINTESTINAL ENDOSCOPY    ? URETHRAL SLING N/A 09/29/2013  ? Procedure: MALE SLING;  Surgeon: [name], MD;  [facility];  Service: Urology;  Laterality: N/A;  ? ? ?There were no vitals filed for this visit. ? ?  Subjective Assessment - 08/24/21 1518   ? ? Subjective Pt reports dizziness is 2-3/10 currently. Pt reports no falls/stumbles. Pt reports he has been performing his HEP everyday.   ? Pertinent History Patient is a 81 year old male with diagnosis of Parkinsons referred to outpatient PT for difficulty with balance  with recent history of falling. He reports 3 falls in past 6 months. He states he does not use any assistive device and noticing more shuffling nature of walkng. 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   ? Limitations Lifting;Standing;Walking;House hold activities   ? How long can you sit comfortably? no issues   ? How long can you stand comfortably? <1 hour   ? How long can you walk comfortably? <30 min   ? Patient Stated Goals My main goal is to improve my balance so I don't fall.   ? Currently in Pain? No/denies   ? Pain Onset More than a month ago   ? ?  ?  ? ?  ? ?INTERVENTIONS- ? ?Standing VORx1, plain background, horizontal head turns 2x30, 1x40, 1x30 sec. Pt dizziness reaches 5/10 with second set. Pt takes recovery intervals throughout.  ? ?Standing VORx1, plain background, vertical head turns 1x30 sec  ? ?At support surface: ?SLB 3x30 sec each LE. Pt with more difficulty on LLE compared to RLE.  ? ?Standing horizontal visual tracking of multi color ball x multiple reps within symptom tolerance. Pt reports this does increase his symptoms.  ? ?Seated bend and reach with visual tracking of multicolored ball x multiple reps. ? ?Semi-tandem gait 2x10 meters  ? ?Tandem gait 1x10 meters and 1x10 meters with dual cog task.  ? ?Ambulating with eyes closed 4x 5 meters. Pt deviates to R side with each set.  ? ?Static standing on airex pad, minimal increase in sway, however, pt reports he feels significant sway. No UE support 2x60 sec ? ?Ankle rocks on airex 12x  ? ?Pt educated throughout session about proper posture and technique with exercises. Improved exercise technique,  movement at target joints, use of target muscles after min to mod verbal, visual, tactile cues. ? ? ? ? PT Education - 08/24/21 1704   ? ? Education Details exercise technique, reviewed VOR technique   ? Person(s) Educated Patient;Spouse   ? Methods Explanation;Demonstration;Verbal cues   ? Comprehension Verbalized understanding;Returned demonstration;Verbal cues required;Need further instruction   ? ?  ?  ? ?  ? ? ? PT Short Term Goals - 08/01/21 1347   ? ?  ? PT SHORT TERM GOAL #1  ? Title Pt will be independent with initial  HEP in order to improve strength and balance in order to decrease fall risk and improve function at home and work.   ? Baseline 06/15/2021- No formal HEP in place; 08/01/2021- Patient verbalized knowledge of initial HEP- inlcuidng walking and balance.   ?  Time 6   ? Period Weeks   ? Status Achieved   ? Target Date 07/27/21   ? ?  ?  ? ?  ? ? ? ? PT Long Term Goals - 08/01/21 1348   ? ?  ? PT LONG TERM GOAL #1  ? Title Pt will be independent with Final HEP in order to improve strength and balance in order to decrease fall risk and improve function at home and work.   ? Baseline 2/16= No formal HEP in place 08/01/2021= See short term goal section   ? Time 12   ? Period Weeks   ? Status On-going   ? Target Date 09/07/21   ?  ? PT LONG TERM GOAL #2  ? Title Pt will improve FOTO to target score of 64 to display perceived improvements in ability to complete ADL's.   ? Baseline 06/15/2021= 57: 08/01/4021= 69   ? Time 12   ? Period Weeks   ? Status Achieved   ? Target Date 09/07/21   ?  ? PT LONG TERM GOAL #3  ? Title Pt will improve BERG by at least 3 points in order to demonstrate clinically significant improvement in balance.   ? Baseline 06/15/2021= 48/56: 08/01/2021= 50/56   ? Time 12   ? Period Weeks   ? Status On-going   ? Target Date 09/07/21   ?  ? PT LONG TERM GOAL #4  ? Title Pt will decrease 5TSTS by at least 3 seconds in order to demonstrate clinically significant improvement in LE strength   ?  Baseline 06/16/2021= 23.0 sec without UE support.  08/01/2021= 14.14 sec without UE support   ? Time 12   ? Period Weeks   ? Status Achieved   ? Target Date 09/07/21   ?  ? PT LONG TERM GOAL #5  ? Title Pt w

## 2021-08-28 ENCOUNTER — Ambulatory Visit: Payer: Medicare Other | Attending: Neurology

## 2021-08-28 DIAGNOSIS — R278 Other lack of coordination: Secondary | ICD-10-CM | POA: Diagnosis present

## 2021-08-28 DIAGNOSIS — R2681 Unsteadiness on feet: Secondary | ICD-10-CM | POA: Insufficient documentation

## 2021-08-28 DIAGNOSIS — R42 Dizziness and giddiness: Secondary | ICD-10-CM | POA: Diagnosis not present

## 2021-08-28 DIAGNOSIS — M6281 Muscle weakness (generalized): Secondary | ICD-10-CM | POA: Insufficient documentation

## 2021-08-28 DIAGNOSIS — R262 Difficulty in walking, not elsewhere classified: Secondary | ICD-10-CM | POA: Insufficient documentation

## 2021-08-28 DIAGNOSIS — R269 Unspecified abnormalities of gait and mobility: Secondary | ICD-10-CM | POA: Diagnosis present

## 2021-08-28 NOTE — Therapy (Signed)
Victor Gould ?[facility] ?[name], [place], [address] ?Phone: [phone]   Fax:  [phone] ? ?Physical Therapy Treatment ? ?Patient Details  ?Name: Victor Gould ?MRN: [id number] ?Date of Birth: 1940/10/05 ?Referring Provider (PT): Victor Gould ? ? ?Encounter Date: 08/28/2021 ? ? PT End of Session - 08/28/21 1311   ? ? Visit Number 18   ? Number of Visits 25   ? Date for PT Re-Evaluation 09/07/21   ? Authorization Time Period Initial cert 6/96/2952- 8/41/3244   ? Progress Note Due on Visit 20   ? PT Start Time 0102   ? PT Stop Time 1344   ? PT Time Calculation (min) 39 min   ? Equipment Utilized During Treatment Gait belt   ? Activity Tolerance Patient tolerated treatment well;No increased pain   ? Behavior During Therapy Victor Gould for tasks assessed/performed   ? ?  ?  ? ?  ? ? ?Past Medical History:  ?Diagnosis Date  ? ASCVD (arteriosclerotic cardiovascular disease)   ? 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 Chronic kidney disease   ? Colon polyps   ? Constipation   ? Coronary artery disease   ? Diabetes mellitus   ? Family history of adverse reaction to anesthesia   ? children - PONV  ? GERD (gastroesophageal reflux disease)   ? Hemorrhoids   ? Hyperlipidemia   ? Hypertension   ? PCP Victor Gould at [place]  ? Hypothyroidism   ? MI (myocardial infarction) ([place])   ? PONV (postoperative nausea and vomiting)   ? Thyroid disease   ? Vertigo   ? positional  ? ? ?Past Surgical History:  ?Procedure Laterality Date  ? APPENDECTOMY    ? CATARACT EXTRACTION W/PHACO Right 10/27/2018  ? Procedure: CATARACT EXTRACTION PHACO AND INTRAOCULAR LENS PLACEMENT (I[facility]) RIGHT, DIABETIC;  Surgeon: Victor Gould;  Location: [place];  Service: Ophthalmology;  Laterality: Right;  diabetic - insulin  ? CATARACT EXTRACTION W/PHACO Left 11/25/2018  ? Procedure: CATARACT EXTRACTION PHACO AND INTRAOCULAR LENS PLACEMENT ([place]) LEFT DIABETIC;  Surgeon: Victor Gould;  Location:  [place];  Service: Ophthalmology;  Laterality: Left;  ? COLONOSCOPY    ? colonoscopy with polypectomy    ? COLONOSCOPY WITH PROPOFOL N/A 12/02/2017  ? Procedure: COLONOSCOPY WITH PROPOFOL;  Surgeon: Victor Gould;  Location: [facility] ENDOSCOPY;  Service: Endoscopy;  Laterality: N/A;  ? CORONARY ARTERY BYPASS GRAFT N/A 08/28/2016  ? Procedure: CORONARY ARTERY BYPASS GRAFTING (CABG) x five, using left internal mammary artery and right leg greater saphenous vein harvested endoscopically - -LIMA to LAD, -SVG to OM, -SVG to DIAGONAL, -SEQ SVG to PDA, PLVB ;  Surgeon: Victor Gould;  Location: [place];  Service: Open Heart Surgery;  Laterality: N/A;  ? CYSTOSCOPY N/A 09/29/2013  ? Procedure: CYSTOSCOPY;  Surgeon: Victor Gould;  Location: [facility];  Service: Urology;  Laterality: N/A;  ? ESOPHAGOGASTRODUODENOSCOPY (EGD) WITH PROPOFOL N/A 12/02/2017  ? Procedure: ESOPHAGOGASTRODUODENOSCOPY (EGD) WITH PROPOFOL;  Surgeon: Victor Gould;  Location: [facility] ENDOSCOPY;  Service: Endoscopy;  Laterality: N/A;  ? HERNIA REPAIR    ? inguinal bilaterally  ? LEFT HEART CATH AND CORONARY ANGIOGRAPHY N/A 08/27/2016  ? Procedure: Left Heart Cath and Coronary Angiography;  Surgeon: Victor Gould;  Location: [place] CV LAB;  Service: Cardiovascular;  Laterality: N/A;  ? ROBOT ASSISTED LAPAROSCOPIC RADICAL PROSTATECTOMY  06/25/2011  ? Procedure: ROBOTIC  ASSISTED LAPAROSCOPIC RADICAL PROSTATECTOMY LEVEL 2;  Surgeon: Victor Gould;  Location: [facility];  Service: Urology;  Laterality: N/A;  ? TEE WITHOUT CARDIOVERSION N/A 08/28/2016  ? Procedure: TRANSESOPHAGEAL ECHOCARDIOGRAM (TEE);  Surgeon: Victor Gould;  Location: [place];  Service: Open Heart Surgery;  Laterality: N/A;  ? UPPER GASTROINTESTINAL ENDOSCOPY    ? URETHRAL SLING N/A 09/29/2013  ? Procedure: MALE SLING;  Surgeon: Victor Gould;  Location: [facility];  Service: Urology;  Laterality: N/A;  ? ? ?There were no vitals filed for this visit. ? ?  Subjective Assessment - 08/28/21 1309   ? ? Subjective Patient reports feeling rough today dealing with upset stomach.   ? Pertinent History Patient is a 81 year old male with diagnosis of Parkinsons referred to outpatient PT for difficulty with balance  with recent history of falling. He reports 3 falls in past 6 months. He states he does not use any assistive device and noticing more shuffling nature of walkng. 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   ? Limitations Lifting;Standing;Walking;House hold activities   ? How long can you sit comfortably? no issues   ? How long can you stand comfortably? <1 hour   ? How long can you walk comfortably? <30 min   ? Patient Stated Goals My main goal is to improve my balance so I don't fall.   ? Currently in Pain? No/denies   ? ?  ?  ? ?  ? ?INTERVENTIONS:  ?Neuromuscular re-ed:  ? ?Dynamic Marching on airex pad x 20 reps (VC to slow down to focus more on balance.  ? ?Side step up and down (lateral ) on/off airex pad x 10 reps each.  ? ?Tandem stance on airex beam - hold 30 sec x 3 each Side ? ?Tandem walk on beam- forward and backward with min 1 UE support at times x 5 back and forth.  ? ?Staggered standing on airex pads with eyes focused on sticky note placed on cabinet in front of patient (70fet) and instructed him to turn his head left to right- Mild unsteadiness yet no report of dizziness.  ? ?Dynamic step over 1/2 foam (forward and backward) x 15 reps ? ?Staggered standing at support bar without UE reach- arranging letters/numbers by color on dry erase board. Mild initial unsteadiness yet much improved with practice. Progressed to tandem standing - performing same activity just narrowed feet to copmlete separating numbers/letter by color on board including reaching with left and right UE.  ? ?Education provided throughout session via VC/TC and demonstration to facilitate movement at target joints and correct muscle activation for all  testing and exercises performed.  ? ? ? ? ? ? ? ? ? ? ? ? ? ? ? ? ? ? ? ? ? ? ? ? ? PT Education - 08/28/21 1310   ? ? Education Details Exercise technique   ? Person(s) Educated Patient   ? Methods Explanation;Demonstration;Tactile cues;Verbal cues   ? Comprehension Verbalized understanding;Returned demonstration;Verbal cues required;Tactile cues required;Need further instruction   ? ?  ?  ? ?  ? ? ? PT Short Term Goals - 08/01/21 1347   ? ?  ? PT SHORT TERM GOAL #1  ? Title Pt will be independent with initial  HEP in order to improve strength and balance in order to decrease fall risk and improve function at home and work.   ? Baseline 06/15/2021- No formal HEP  in place; 08/01/2021- Patient verbalized knowledge of initial HEP- inlcuidng walking and balance.   ? Time 6   ? Period Weeks   ? Status Achieved   ? Target Date 07/27/21   ? ?  ?  ? ?  ? ? ? ? PT Long Term Goals - 08/01/21 1348   ? ?  ? PT LONG TERM GOAL #1  ? Title Pt will be independent with Final HEP in order to improve strength and balance in order to decrease fall risk and improve function at home and work.   ? Baseline 2/16= No formal HEP in place 08/01/2021= See short term goal section   ? Time 12   ? Period Weeks   ? Status On-going   ? Target Date 09/07/21   ?  ? PT LONG TERM GOAL #2  ? Title Pt will improve FOTO to target score of 64 to display perceived improvements in ability to complete ADL's.   ? Baseline 06/15/2021= 57: 08/01/4021= 69   ? Time 12   ? Period Weeks   ? Status Achieved   ? Target Date 09/07/21   ?  ? PT LONG TERM GOAL #3  ? Title Pt will improve BERG by at least 3 points in order to demonstrate clinically significant improvement in balance.   ? Baseline 06/15/2021= 48/56: 08/01/2021= 50/56   ? Time 12   ? Period Weeks   ? Status On-going   ? Target Date 09/07/21   ?  ? PT LONG TERM GOAL #4  ? Title Pt will decrease 5TSTS by at least 3 seconds in order to demonstrate clinically significant improvement in LE strength   ? Baseline 06/16/2021=  23.0 sec without UE support.  08/01/2021= 14.14 sec without UE support   ? Time 12   ? Period Weeks   ? Status Achieved   ? Target Date 09/07/21   ?  ? PT LONG TERM GOAL #5  ? Title Pt will increase 10MW

## 2021-08-30 ENCOUNTER — Ambulatory Visit: Payer: Medicare Other

## 2021-08-30 DIAGNOSIS — M6281 Muscle weakness (generalized): Secondary | ICD-10-CM

## 2021-08-30 DIAGNOSIS — R278 Other lack of coordination: Secondary | ICD-10-CM

## 2021-08-30 DIAGNOSIS — R269 Unspecified abnormalities of gait and mobility: Secondary | ICD-10-CM

## 2021-08-30 DIAGNOSIS — R42 Dizziness and giddiness: Secondary | ICD-10-CM | POA: Diagnosis not present

## 2021-08-30 DIAGNOSIS — R262 Difficulty in walking, not elsewhere classified: Secondary | ICD-10-CM

## 2021-08-30 DIAGNOSIS — R2681 Unsteadiness on feet: Secondary | ICD-10-CM

## 2021-08-30 NOTE — Therapy (Signed)
[facility] ?[facility] ?[place], [place], [address] ?Phone: [phone]   Fax:  [phone] ? ?Physical Therapy Treatment ? ?Patient Details  ?Name: Victor Gould ?MRN: [id number] ?Date of Birth: April 25, 1941 ?Referring Provider (PT): Dr. Manuella Ghazi ? ? ?Encounter Date: 08/30/2021 ? ? PT End of Session - 08/30/21 1350   ? ? Visit Number 19   ? Number of Visits 25   ? Date for PT Re-Evaluation 09/07/21   ? Authorization Time Period Initial cert 0/94/7096- 2/83/6629   ? Progress Note Due on Visit 20   ? PT Start Time 1301   ? PT Stop Time 4765   ? PT Time Calculation (min) 44 min   ? Equipment Utilized During Treatment Gait belt   ? Activity Tolerance Patient tolerated treatment well;No increased pain   ? Behavior During Therapy [facility] for tasks assessed/performed   ? ?  ?  ? ?  ? ? ?Past Medical History:  ?Diagnosis Date  ? ASCVD (arteriosclerotic cardiovascular disease)   ? 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 Chronic kidney disease   ? Colon polyps   ? Constipation   ? Coronary artery disease   ? Diabetes mellitus   ? Family history of adverse reaction to anesthesia   ? children - PONV  ? GERD (gastroesophageal reflux disease)   ? Hemorrhoids   ? Hyperlipidemia   ? Hypertension   ? PCP [name] at [place]  ? Hypothyroidism   ? MI (myocardial infarction) ([place])   ? PONV (postoperative nausea and vomiting)   ? Thyroid disease   ? Vertigo   ? positional  ? ? ?Past Surgical History:  ?Procedure Laterality Date  ? APPENDECTOMY    ? CATARACT EXTRACTION W/PHACO Right 10/27/2018  ? Procedure: CATARACT EXTRACTION PHACO AND INTRAOCULAR LENS PLACEMENT ([facility]) RIGHT, DIABETIC;  Surgeon: [name], MD;  Location: [place];  Service: Ophthalmology;  Laterality: Right;  diabetic - insulin  ? CATARACT EXTRACTION W/PHACO Left 11/25/2018  ? Procedure: CATARACT EXTRACTION PHACO AND INTRAOCULAR LENS PLACEMENT ([place]) LEFT DIABETIC;  Surgeon: [name], MD;  Location:  [place];  Service: Ophthalmology;  Laterality: Left;  ? COLONOSCOPY    ? colonoscopy with polypectomy    ? COLONOSCOPY WITH PROPOFOL N/A 12/02/2017  ? Procedure: COLONOSCOPY WITH PROPOFOL;  Surgeon: [name], MD;  Location: [facility] ENDOSCOPY;  Service: Endoscopy;  Laterality: N/A;  ? CORONARY ARTERY BYPASS GRAFT N/A 08/28/2016  ? Procedure: CORONARY ARTERY BYPASS GRAFTING (CABG) x five, using left internal mammary artery and right leg greater saphenous vein harvested endoscopically - -LIMA to LAD, -SVG to OM, -SVG to DIAGONAL, -SEQ SVG to PDA, PLVB ;  Surgeon: [name], MD;  Location: [place];  Service: Open Heart Surgery;  Laterality: N/A;  ? CYSTOSCOPY N/A 09/29/2013  ? Procedure: CYSTOSCOPY;  Surgeon: [name], MD;  [facility];  Service: Urology;  Laterality: N/A;  ? ESOPHAGOGASTRODUODENOSCOPY (EGD) WITH PROPOFOL N/A 12/02/2017  ? Procedure: ESOPHAGOGASTRODUODENOSCOPY (EGD) WITH PROPOFOL;  Surgeon: [name], MD;  Location: [facility] ENDOSCOPY;  Service: Endoscopy;  Laterality: N/A;  ? HERNIA REPAIR    ? inguinal bilaterally  ? LEFT HEART CATH AND CORONARY ANGIOGRAPHY N/A 08/27/2016  ? Procedure: Left Heart Cath and Coronary Angiography;  Surgeon: [name], MD;  Location: [place] [facility];  Service: Cardiovascular;  Laterality: N/A;  ? ROBOT ASSISTED LAPAROSCOPIC RADICAL PROSTATECTOMY  06/25/2011  ? Procedure: ROBOTIC  ASSISTED LAPAROSCOPIC RADICAL PROSTATECTOMY LEVEL 2;  Surgeon: [name], MD;  [facility];  Service: Urology;  Laterality: N/A;  ? TEE WITHOUT CARDIOVERSION N/A 08/28/2016  ? Procedure: TRANSESOPHAGEAL ECHOCARDIOGRAM (TEE);  Surgeon: [name], MD;  Location: [place];  Service: Open Heart Surgery;  Laterality: N/A;  ? UPPER GASTROINTESTINAL ENDOSCOPY    ? URETHRAL SLING N/A 09/29/2013  ? Procedure: MALE SLING;  Surgeon: [name], MD;  [facility];  Service: Urology;  Laterality: N/A;  ? ? ?There were no vitals filed for this visit. ? ?  Subjective Assessment - 08/30/21 1305   ? ? Subjective Patient reports no significant dizziness since last visit.   ? Pertinent History Patient is a 81 year old male with diagnosis of Parkinsons referred to outpatient PT for difficulty with balance  with recent history of falling. He reports 3 falls in past 6 months. He states he does not use any assistive device and noticing more shuffling nature of walkng. 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   ? Limitations Lifting;Standing;Walking;House hold activities   ? How long can you sit comfortably? no issues   ? How long can you stand comfortably? <1 hour   ? How long can you walk comfortably? <30 min   ? Patient Stated Goals My main goal is to improve my balance so I don't fall.   ? Currently in Pain? No/denies   ? ?  ?  ? ?  ? ? ?INTERVENTIONS:  ?Patient brought in his various HEP handout and requested to go through them and try to condense.  ? ?Consolidated HEP: ? ?GAze stabilization with head rotation - 3sets of 10 reps- Instructed to perform daily. No dizziness reported  ? ?Sit to stand wihtout UE Support- x 10 reps - VC for positioning ? ?Other strengthening exercises: ? Squat with arms flexed forward into standing with shoulders extended- x 12 reps ?-Dynamic standing weight shift and reach with same side with outstretched arm ?- Trunk rotation with outstretched arm into a clap x 12 reps ?-Side Step and reach with outstretched arms x 12  ?-Prone on forearms into prone on hands x 12  ?- Prone with weight shift and reach BE x 12 reps each ?-Sidelye (trunk rotation) - open book/closed book x 12 each side. ? ?Balance- review: ?Start by standing in corner of kitchen with a chair in front of you.  ?Feet Apart ?1) - eyes open x 20 sec ?2)- eyes closed x 20 sec ?3) - eyes open with head turns left to right x 5, then nod x 5 ?Progress to  ?Feet together with eyes open x 20 sec x3 ?Feet together with eyes closed x 20 sec x 3 ?Feet together  with eyes open and head turns left to right and nod x 5 ?Feet together with eyes closed and head turns left to right  and nod x 5 ?  ?Progress to ? Feet staggered- 1 foot 1/2 in front of the other ?Stand with 1 foot in front of the other ?  ? ? ?Education provided throughout session via VC/TC and demonstration to facilitate movement at target joints and correct muscle activation for all testing and exercises performed.  ? ? ? ? ? ? ? ? ? ? ? ? ? ? ? ? ? ? ? ? ? ? ? ? PT Education - 08/30/21 1354   ? ? Education Details Review of HEP   ?  Person(s) Educated Patient   ? Methods Explanation;Tactile cues;Demonstration;Verbal cues;Handout   ? Comprehension Verbalized understanding;Returned demonstration;Verbal cues required;Tactile cues required;Need further instruction   ? ?  ?  ? ?  ? ? ? PT Short Term Goals - 08/01/21 1347   ? ?  ? PT SHORT TERM GOAL #1  ? Title Pt will be independent with initial  HEP in order to improve strength and balance in order to decrease fall risk and improve function at home and work.   ? Baseline 06/15/2021- No formal HEP in place; 08/01/2021- Patient verbalized knowledge of initial HEP- inlcuidng walking and balance.   ? Time 6   ? Period Weeks   ? Status Achieved   ? Target Date 07/27/21   ? ?  ?  ? ?  ? ? ? ? PT Long Term Goals - 08/01/21 1348   ? ?  ? PT LONG TERM GOAL #1  ? Title Pt will be independent with Final HEP in order to improve strength and balance in order to decrease fall risk and improve function at home and work.   ? Baseline 2/16= No formal HEP in place 08/01/2021= See short term goal section   ? Time 12   ? Period Weeks   ? Status On-going   ? Target Date 09/07/21   ?  ? PT LONG TERM GOAL #2  ? Title Pt will improve FOTO to target score of 64 to display perceived improvements in ability to complete ADL's.   ? Baseline 06/15/2021= 57: 08/01/4021= 69   ? Time 12   ? Period Weeks   ? Status Achieved   ? Target Date 09/07/21   ?  ? PT LONG TERM GOAL #3  ? Title Pt will improve BERG  by at least 3 points in order to demonstrate clinically significant improvement in balance.   ? Baseline 06/15/2021= 48/56: 08/01/2021= 50/56   ? Time 12   ? Period Weeks   ? Status On-going   ? Target D

## 2021-09-04 ENCOUNTER — Ambulatory Visit: Payer: Medicare Other

## 2021-09-04 DIAGNOSIS — R42 Dizziness and giddiness: Secondary | ICD-10-CM | POA: Diagnosis not present

## 2021-09-04 DIAGNOSIS — R278 Other lack of coordination: Secondary | ICD-10-CM

## 2021-09-04 DIAGNOSIS — R262 Difficulty in walking, not elsewhere classified: Secondary | ICD-10-CM

## 2021-09-04 DIAGNOSIS — M6281 Muscle weakness (generalized): Secondary | ICD-10-CM

## 2021-09-04 DIAGNOSIS — R2681 Unsteadiness on feet: Secondary | ICD-10-CM

## 2021-09-04 DIAGNOSIS — R269 Unspecified abnormalities of gait and mobility: Secondary | ICD-10-CM

## 2021-09-04 NOTE — Therapy (Signed)
[facility] ?[place], [place], [address] ?Phone: [phone]   Fax:  [phone] ? ?Physical Therapy Treatment/Physical Therapy Progress Note ? ? ?Dates of reporting period  08/01/2021  to  09/04/2021 ? ?Patient Details  ?Name: Victor Gould ?MRN: [id number] ?Date of Birth: 02/03/1941 ?Referring Provider (PT): Dr. Manuella Ghazi ? ? ?Encounter Date: 09/04/2021 ? ? PT End of Session - 09/04/21 1337   ? ? Visit Number 20   ? Number of Visits 25   ? Date for PT Re-Evaluation 09/07/21   ? Authorization Time Period Initial cert 4/66/5993- 5/70/1779   ? Progress Note Due on Visit 30   ? PT Start Time 3903   ? PT Stop Time 1420   ? PT Time Calculation (min) 42 min   ? Equipment Utilized During Treatment Gait belt   ? Activity Tolerance Patient tolerated treatment well;No increased pain   ? Behavior During Therapy [facility] for tasks assessed/performed   ? ?  ?  ? ?  ? ? ?Past Medical History:  ?Diagnosis Date  ? ASCVD (arteriosclerotic cardiovascular disease)   ? 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 Chronic kidney disease   ? Colon polyps   ? Constipation   ? Coronary artery disease   ? Diabetes mellitus   ? Family history of adverse reaction to anesthesia   ? children - PONV  ? GERD (gastroesophageal reflux disease)   ? Hemorrhoids   ? Hyperlipidemia   ? Hypertension   ? PCP [name] at [place]  ? Hypothyroidism   ? MI (myocardial infarction) ([place])   ? PONV (postoperative nausea and vomiting)   ? Thyroid disease   ? Vertigo   ? positional  ? ? ?Past Surgical History:  ?Procedure Laterality Date  ? APPENDECTOMY    ? CATARACT EXTRACTION W/PHACO Right 10/27/2018  ? Procedure: CATARACT EXTRACTION PHACO AND INTRAOCULAR LENS PLACEMENT (IOC) RIGHT, DIABETIC;  Surgeon: [name], MD;  Location: [place];  Service: Ophthalmology;  Laterality: Right;  diabetic - insulin  ? CATARACT EXTRACTION W/PHACO Left 11/25/2018  ? Procedure: CATARACT EXTRACTION PHACO AND  INTRAOCULAR LENS PLACEMENT ([place]) LEFT DIABETIC;  Surgeon: [name], MD;  Location: [place];  Service: Ophthalmology;  Laterality: Left;  ? COLONOSCOPY    ? colonoscopy with polypectomy    ? COLONOSCOPY WITH PROPOFOL N/A 12/02/2017  ? Procedure: COLONOSCOPY WITH PROPOFOL;  Surgeon: [name], MD;  Location: [facility] ENDOSCOPY;  Service: Endoscopy;  Laterality: N/A;  ? CORONARY ARTERY BYPASS GRAFT N/A 08/28/2016  ? Procedure: CORONARY ARTERY BYPASS GRAFTING (CABG) x five, using left internal mammary artery and right leg greater saphenous vein harvested endoscopically - -LIMA to LAD, -SVG to OM, -SVG to DIAGONAL, -SEQ SVG to PDA, PLVB ;  Surgeon: [name], MD;  Location: [place];  Service: Open Heart Surgery;  Laterality: N/A;  ? CYSTOSCOPY N/A 09/29/2013  ? Procedure: CYSTOSCOPY;  Surgeon: [name], MD;  [facility]: [facility];  Service: Urology;  Laterality: N/A;  ? ESOPHAGOGASTRODUODENOSCOPY (EGD) WITH PROPOFOL N/A 12/02/2017  ? Procedure: ESOPHAGOGASTRODUODENOSCOPY (EGD) WITH PROPOFOL;  Surgeon: [name], MD;  Location: [facility] ENDOSCOPY;  Service: Endoscopy;  Laterality: N/A;  ? HERNIA REPAIR    ? inguinal bilaterally  ? LEFT HEART CATH AND CORONARY ANGIOGRAPHY N/A 08/27/2016  ? Procedure: Left Heart Cath and Coronary Angiography;  Surgeon: [name], MD;  Location: [place] [facility];  Service: Cardiovascular;  Laterality: N/A;  ? ROBOT ASSISTED LAPAROSCOPIC RADICAL PROSTATECTOMY  06/25/2011  ? Procedure: ROBOTIC ASSISTED LAPAROSCOPIC RADICAL PROSTATECTOMY LEVEL 2;  Surgeon: [name], MD;  Location: [facility];  Service: Urology;  Laterality: N/A;  ? TEE WITHOUT CARDIOVERSION N/A 08/28/2016  ? Procedure: TRANSESOPHAGEAL ECHOCARDIOGRAM (TEE);  Surgeon: [name], MD;  Location: [place];  Service: Open Heart Surgery;  Laterality: N/A;  ? UPPER GASTROINTESTINAL ENDOSCOPY    ? URETHRAL SLING N/A 09/29/2013  ? Procedure: MALE SLING;  Surgeon: [name], MD;  [facility];  Service: Urology;  Laterality: N/A;  ? ? ?There were no vitals filed for this visit. ? ? Subjective Assessment - 09/04/21 1336   ? ? Subjective Patient reports he was experiencing some back pain over the weekened but feeling much better overall today.   ? Pertinent History Patient is a 81 year old male with diagnosis of Parkinsons referred to outpatient PT for difficulty with balance  with recent history of falling. He reports 3 falls in past 6 months. He states he does not use any assistive device and noticing more shuffling nature of walkng. 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   ? Limitations Lifting;Standing;Walking;House hold activities   ? How long can you sit comfortably? no issues   ? How long can you stand comfortably? <1 hour   ? How long can you walk comfortably? <30 min   ? Patient Stated Goals My main goal is to improve my balance so I don't fall.   ? Currently in Pain? No/denies   ? ?  ?  ? ?  ? ? ?INTERVENTIONS: ? ? ?Balance training: Using borrowed kit/peg board from OT including nuts/bolts, screws- Patient was provided various tools- Flathead and phillips screwdriver, wrench and use of fingers to loosen and tighten various screws/bolts on peg board - all while standing in various balance positions for dual tasks and dynamic balance.  ?Feet positions included -  ?On flat surface- comfortable width ?On blue airex pad with comfortable width ?On blue airex pad with feet narrowed ?On blue airex pad with feet staggered ?On blue ariex pad with 2 hedgehogs positioned underneath for added unstable surface.  ? ? ? ?Assessed the following for progress note today:  ? ?10 MWT= 0.90 m/s without AD on 2 trial avg ?TUG= 14.8 sec avg on 2 trials without UE support.  ? ?Education provided throughout session via VC/TC and demonstration to facilitate movement at target joints and correct muscle activation for all testing and exercises performed.   ? ? ? ? ? ? ? ? ? ? ? ? ? ? ? ? ? ? ? ? ? ? ? ? PT Education - 09/04/21 1337   ? ? Education Details Exercise technique   ? Person(s) Educated Patient   ? Methods Explanation;Demonstration;Tactile cues;Verbal cues   ? Comprehension Verbalized understanding;Returned demonstration;Verbal cues required;Tactile cues required;Need further instruction   ? ?  ?  ? ?  ? ? ? PT Short Term Goals - 08/01/21 1347   ? ?  ? PT SHORT TERM GOAL #1  ? Title Pt will be independent with initial  HEP in order to improve strength and balance in order to decrease fall risk and improve function at home and work.   ? Baseline 06/15/2021- No formal HEP in place; 08/01/2021- Patient verbalized knowledge of initial HEP- inlcuidng walking and balance.   ? Time 6   ? Period Weeks   ?  Status Achieved   ? Target Date 07/27/21   ? ?  ?  ? ?  ? ? ? ? PT Long Term Goals - 08/01/21 1348   ? ?  ? PT LONG TERM GOAL #1  ? Title Pt will be independent with Final HEP in order to improve strength and balance in order to decrease fall risk and improve function at home and work.   ? Baseline 2/16= No formal HEP in place 08/01/2021= See short term goal section   ? Time 12   ? Period Weeks   ? Status On-going   ? Target Date 09/07/21   ?  ? PT LONG TERM GOAL #2  ? Title Pt will improve FOTO to target score of 64 to display perceived improvements in ability to complete ADL's.   ? Baseline 06/15/2021= 57: 08/01/4021= 69   ? Time 12   ? Period Weeks   ? Status Achieved   ? Target Date 09/07/21   ?  ? PT LONG TERM GOAL #3  ? Title Pt will improve BERG by at least 3 points in order to demonstrate clinically significant improvement in balance.   ? Baseline 06/15/2021= 48/56: 08/01/2021= 50/56   ? Time 12   ? Period Weeks   ? Status On-going   ? Target Date 09/07/21   ?  ? PT LONG TERM GOAL #4  ? Title Pt will decrease 5TSTS by at least 3 seconds in order to demonstrate clinically significant improvement in LE strength   ? Baseline 06/16/2021= 23.0 sec without UE support.   08/01/2021= 14.14 sec without UE support   ? Time 12   ? Period Weeks   ? Status Achieved   ? Target Date 09/07/21   ?  ? PT LONG TERM GOAL #5  ? Title Pt will increase 10MWT by at least 0.13 m/s in order to demonstrate clinically significant impr

## 2021-09-06 ENCOUNTER — Ambulatory Visit: Payer: Medicare Other | Admitting: Physical Therapy

## 2021-09-06 ENCOUNTER — Encounter: Payer: Self-pay | Admitting: Physical Therapy

## 2021-09-06 DIAGNOSIS — R2681 Unsteadiness on feet: Secondary | ICD-10-CM

## 2021-09-06 DIAGNOSIS — R269 Unspecified abnormalities of gait and mobility: Secondary | ICD-10-CM

## 2021-09-06 DIAGNOSIS — R42 Dizziness and giddiness: Secondary | ICD-10-CM | POA: Diagnosis not present

## 2021-09-06 DIAGNOSIS — R262 Difficulty in walking, not elsewhere classified: Secondary | ICD-10-CM

## 2021-09-06 NOTE — Therapy (Signed)
[employer] [facility] ?[place], [place], [address] ?Phone: [phone]   Fax:  [phone] ? ?Physical Therapy Treatment ? ?Patient Details  ?Name: Victor Gould ?MRN: [id number] ?Date of Birth: 24-Jun-1940 ?Referring Provider (PT): Dr. Manuella Ghazi ? ? ?Encounter Date: 09/06/2021 ? ? PT End of Session - 09/06/21 1512   ? ? Visit Number 21   ? Number of Visits 25   ? Date for PT Re-Evaluation 09/07/21   ? Authorization Time Period Initial cert 9/62/2297- 9/89/2119   ? Progress Note Due on Visit 30   ? PT Start Time 4174   ? PT Stop Time 1500   ? PT Time Calculation (min) 27 min   ? Equipment Utilized During Treatment Gait belt   ? Activity Tolerance Patient tolerated treatment well;No increased pain   ? Behavior During Therapy [facility] for tasks assessed/performed   ? ?  ?  ? ?  ? ? ?Past Medical History:  ?Diagnosis Date  ? ASCVD (arteriosclerotic cardiovascular disease)   ? 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 Chronic kidney disease   ? Colon polyps   ? Constipation   ? Coronary artery disease   ? Diabetes mellitus   ? Family history of adverse reaction to anesthesia   ? children - PONV  ? GERD (gastroesophageal reflux disease)   ? Hemorrhoids   ? Hyperlipidemia   ? Hypertension   ? PCP [name] at [place]  ? Hypothyroidism   ? MI (myocardial infarction) ([place])   ? PONV (postoperative nausea and vomiting)   ? Thyroid disease   ? Vertigo   ? positional  ? ? ?Past Surgical History:  ?Procedure Laterality Date  ? APPENDECTOMY    ? CATARACT EXTRACTION W/PHACO Right 10/27/2018  ? Procedure: CATARACT EXTRACTION PHACO AND INTRAOCULAR LENS PLACEMENT (IOC) RIGHT, DIABETIC;  Surgeon: [name], MD;  Location: [place];  Service: Ophthalmology;  Laterality: Right;  diabetic - insulin  ? CATARACT EXTRACTION W/PHACO Left 11/25/2018  ? Procedure: CATARACT EXTRACTION PHACO AND INTRAOCULAR LENS PLACEMENT ([place]) LEFT DIABETIC;  Surgeon: [name], MD;  Location:  [place];  Service: Ophthalmology;  Laterality: Left;  ? COLONOSCOPY    ? colonoscopy with polypectomy    ? COLONOSCOPY WITH PROPOFOL N/A 12/02/2017  ? Procedure: COLONOSCOPY WITH PROPOFOL;  Surgeon: [name], MD;  [facility] ENDOSCOPY;  Service: Endoscopy;  Laterality: N/A;  ? CORONARY ARTERY BYPASS GRAFT N/A 08/28/2016  ? Procedure: CORONARY ARTERY BYPASS GRAFTING (CABG) x five, using left internal mammary artery and right leg greater saphenous vein harvested endoscopically - -LIMA to LAD, -SVG to OM, -SVG to DIAGONAL, -SEQ SVG to PDA, PLVB ;  Surgeon: [name], MD;  Location: [place];  Service: Open Heart Surgery;  Laterality: N/A;  ? CYSTOSCOPY N/A 09/29/2013  ? Procedure: CYSTOSCOPY;  Surgeon: [name], MD;  [facility];  Service: Urology;  Laterality: N/A;  ? ESOPHAGOGASTRODUODENOSCOPY (EGD) WITH PROPOFOL N/A 12/02/2017  ? Procedure: ESOPHAGOGASTRODUODENOSCOPY (EGD) WITH PROPOFOL;  Surgeon: [name], MD;  Location: [facility] ENDOSCOPY;  Service: Endoscopy;  Laterality: N/A;  ? HERNIA REPAIR    ? inguinal bilaterally  ? LEFT HEART CATH AND CORONARY ANGIOGRAPHY N/A 08/27/2016  ? Procedure: Left Heart Cath and Coronary Angiography;  Surgeon: [name], MD;  Location: [place] [facility];  Service: Cardiovascular;  Laterality: N/A;  ? ROBOT ASSISTED LAPAROSCOPIC RADICAL PROSTATECTOMY  06/25/2011  ? Procedure: ROBOTIC  ASSISTED LAPAROSCOPIC RADICAL PROSTATECTOMY LEVEL 2;  Surgeon: [name], MD;  [facility];  Service: Urology;  Laterality: N/A;  ? TEE WITHOUT CARDIOVERSION N/A 08/28/2016  ? Procedure: TRANSESOPHAGEAL ECHOCARDIOGRAM (TEE);  Surgeon: [name], MD;  Location: [place];  Service: Open Heart Surgery;  Laterality: N/A;  ? UPPER GASTROINTESTINAL ENDOSCOPY    ? URETHRAL SLING N/A 09/29/2013  ? Procedure: MALE SLING;  Surgeon: [name], MD;  [facility];  Service: Urology;  Laterality: N/A;  ? ? ?There were no vitals filed for this visit. ? ?  Subjective Assessment - 09/06/21 1439   ? ? Subjective Pt reports no pain at this time but he has had some previous complaints fo muscle spasms in his lower back. Pt reports issues with his glucose monitor ( sensor error) but declined to have clinic check seocndary to charge associated with this. Only perofrmed supine activities as a result.   ? Pertinent History Patient is a 81 year old male with diagnosis of Parkinsons referred to outpatient PT for difficulty with balance  with recent history of falling. He reports 3 falls in past 6 months. He states he does not use any assistive device and noticing more shuffling nature of walkng. 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   ? Limitations Lifting;Standing;Walking;House hold activities   ? How long can you sit comfortably? no issues   ? How long can you stand comfortably? <1 hour   ? How long can you walk comfortably? <30 min   ? Patient Stated Goals My main goal is to improve my balance so I don't fall.   ? ?  ?  ? ?  ? ? ?INTERVENTIONS:  ?Therapeutic exercise ?Dynamic Marching on airex pad x 20 reps (VC to slow down to focus more on balance.  ?-Patient has some shaking of his exercise and was unsure whether it was due to his blood glucose.  Remainder exercises performed in supine as result. ?Supine power up x10 with instruction via manual and tactile cues for proper form.  Supine power twist with verbal and tactile cues for performance x10 repetitions each side.  Patient did note some dizziness and upset stomach with this activity.  Session and following these as a result of patient's upset stomach as well as his issues with his glucose monitor patient instructed to check glucose upon arrival home patient company by his wife upon leaving clinic. ? ?Education provided throughout session via VC/TC and demonstration to facilitate movement at target joints and correct muscle activation for all testing and exercises performed.   ? ? ? ? ? ? ? ? ? ? ? ? ? ? ? ? ? ? ? ? ? ? ? ? ? ? ? ? ? ? ? PT Education - 09/06/21 1512   ? ? Education Details exercise technique   ? Person(s) Educated Patient   ? Methods Explanation;Demonstration   ? Comprehension Verbalized understanding   ? ?  ?  ? ?  ? ? ? PT Short Term Goals - 08/01/21 1347   ? ?  ? PT SHORT TERM GOAL #1  ? Title Pt will be independent with initial  HEP in order to improve strength and balance in order to decrease fall risk and improve function at home and work.   ? Baseline 06/15/2021- No formal HEP in place; 08/01/2021- Patient verbalized knowledge of initial HEP- inlcuidng walking and balance.   ? Time 6   ?  Period Weeks   ? Status Achieved   ? Target Date 07/27/21   ? ?  ?  ? ?  ? ? ? ? PT Long Term Goals - 08/01/21 1348   ? ?  ? PT LONG TERM GOAL #1  ? Title Pt will be independent with Final HEP in order to improve strength and balance in order to decrease fall risk and improve function at home and work.   ? Baseline 2/16= No formal HEP in place 08/01/2021= See short term goal section   ? Time 12   ? Period Weeks   ? Status On-going   ? Target Date 09/07/21   ?  ? PT LONG TERM GOAL #2  ? Title Pt will improve FOTO to target score of 64 to display perceived improvements in ability to complete ADL's.   ? Baseline 06/15/2021= 57: 08/01/4021= 69   ? Time 12   ? Period Weeks   ? Status Achieved   ? Target Date 09/07/21   ?  ? PT LONG TERM GOAL #3  ? Title Pt will improve BERG by at least 3 points in order to demonstrate clinically significant improvement in balance.   ? Baseline 06/15/2021= 48/56: 08/01/2021= 50/56   ? Time 12   ? Period Weeks   ? Status On-going   ? Target Date 09/07/21   ?  ? PT LONG TERM GOAL #4  ? Title Pt will decrease 5TSTS by at least 3 seconds in order to demonstrate clinically significant improvement in LE strength   ? Baseline 06/16/2021= 23.0 sec without UE support.  08/01/2021= 14.14 sec without UE support   ? Time 12   ? Period Weeks   ? Status Achieved   ? Target Date  09/07/21   ?  ? PT LONG TERM GOAL #5  ? Title Pt will increase 10MWT by at least 0.13 m/s in order to demonstrate clinically significant improvement in community ambulation.   ? Baseline 06/15/2021= 0.81; 4/4/202

## 2021-09-07 ENCOUNTER — Telehealth: Payer: Self-pay | Admitting: Cardiovascular Disease

## 2021-09-07 NOTE — Telephone Encounter (Signed)
3 attempts to schedule fu appt from recall list.   Deleting recall.   

## 2021-09-11 ENCOUNTER — Ambulatory Visit: Payer: Medicare Other

## 2021-09-11 DIAGNOSIS — R42 Dizziness and giddiness: Secondary | ICD-10-CM | POA: Diagnosis not present

## 2021-09-11 DIAGNOSIS — R269 Unspecified abnormalities of gait and mobility: Secondary | ICD-10-CM

## 2021-09-11 DIAGNOSIS — M6281 Muscle weakness (generalized): Secondary | ICD-10-CM

## 2021-09-11 DIAGNOSIS — R278 Other lack of coordination: Secondary | ICD-10-CM

## 2021-09-11 DIAGNOSIS — R262 Difficulty in walking, not elsewhere classified: Secondary | ICD-10-CM

## 2021-09-11 DIAGNOSIS — R2681 Unsteadiness on feet: Secondary | ICD-10-CM

## 2021-09-11 NOTE — Therapy (Signed)
[place] [facility] ?[place], [place], [address] ?Phone: [phone]   Fax:  [phone] ? ?Physical Therapy Treatment/Recertification for dates 09/11/2021-12/04/2021 ? ?Patient Details  ?Name: Victor Gould ?MRN: [id number] ?Date of Birth: [birth year] ?Referring Provider (PT): Dr. Manuella Ghazi ? ? ?Encounter Date: 09/11/2021 ? ? PT End of Session - 09/11/21 1518   ? ? Visit Number 22   ? Number of Visits 25   ? Date for PT Re-Evaluation 09/07/21   ? Authorization Time Period Initial cert 3/61/4431- 5/40/0867; Recert 10/17/5091- 06/05/7122   ? Progress Note Due on Visit 30   ? PT Start Time 5809   ? PT Stop Time 1440   ? PT Time Calculation (min) 43 min   ? Equipment Utilized During Treatment Gait belt   ? Activity Tolerance Patient tolerated treatment well   ? Behavior During Therapy [facility] for tasks assessed/performed   ? ?  ?  ? ?  ? ? ?Past Medical History:  ?Diagnosis Date  ? ASCVD (arteriosclerotic cardiovascular disease)   ? Cancer [facility])   ? prostate  ? Chronic kidney disease   ? Colon polyps   ? Constipation   ? Coronary artery disease   ? Diabetes mellitus   ? Family history of adverse reaction to anesthesia   ? children - PONV  ? GERD (gastroesophageal reflux disease)   ? Hemorrhoids   ? Hyperlipidemia   ? Hypertension   ? PCP [name] at [facility]  ? Hypothyroidism   ? MI (myocardial infarction) ([place])   ? PONV (postoperative nausea and vomiting)   ? Thyroid disease   ? Vertigo   ? positional  ? ? ?Past Surgical History:  ?Procedure Laterality Date  ? APPENDECTOMY    ? CATARACT EXTRACTION W/PHACO Right 10/27/2018  ? Procedure: CATARACT EXTRACTION PHACO AND INTRAOCULAR LENS PLACEMENT (IOC) RIGHT, DIABETIC;  Surgeon: [name], MD;  Location: [place];  Service: Ophthalmology;  Laterality: Right;  diabetic - insulin  ? CATARACT EXTRACTION W/PHACO Left 11/25/2018  ? Procedure: CATARACT EXTRACTION PHACO AND INTRAOCULAR LENS PLACEMENT ([place])  LEFT DIABETIC;  Surgeon: [name], MD;  Location: [place];  Service: Ophthalmology;  Laterality: Left;  ? COLONOSCOPY    ? colonoscopy with polypectomy    ? COLONOSCOPY WITH PROPOFOL N/A 12/02/2017  ? Procedure: COLONOSCOPY WITH PROPOFOL;  Surgeon: [name], MD;  Location: [facility] ENDOSCOPY;  Service: Endoscopy;  Laterality: N/A;  ? CORONARY ARTERY BYPASS GRAFT N/A 08/28/2016  ? Procedure: CORONARY ARTERY BYPASS GRAFTING (CABG) x five, using left internal mammary artery and right leg greater saphenous vein harvested endoscopically - -LIMA to LAD, -SVG to OM, -SVG to DIAGONAL, -SEQ SVG to PDA, PLVB ;  Surgeon: [name], MD;  Location: [place];  Service: Open Heart Surgery;  Laterality: N/A;  ? CYSTOSCOPY N/A 09/29/2013  ? Procedure: CYSTOSCOPY;  Surgeon: [name], MD;  [facility];  Service: Urology;  Laterality: N/A;  ? ESOPHAGOGASTRODUODENOSCOPY (EGD) WITH PROPOFOL N/A 12/02/2017  ? Procedure: ESOPHAGOGASTRODUODENOSCOPY (EGD) WITH PROPOFOL;  Surgeon: [name], MD;  Location: [facility] ENDOSCOPY;  Service: Endoscopy;  Laterality: N/A;  ? HERNIA REPAIR    ? inguinal bilaterally  ? LEFT HEART CATH AND CORONARY ANGIOGRAPHY N/A 08/27/2016  ? Procedure: Left Heart Cath and Coronary Angiography;  Surgeon: [name], MD;  Location: [facility];  Service: Cardiovascular;  Laterality: N/A;  ? ROBOT ASSISTED LAPAROSCOPIC RADICAL PROSTATECTOMY  06/25/2011  ?  Procedure: ROBOTIC ASSISTED LAPAROSCOPIC RADICAL PROSTATECTOMY LEVEL 2;  Surgeon: [name], MD;  [facility];  Service: Urology;  Laterality: N/A;  ? TEE WITHOUT CARDIOVERSION N/A 08/28/2016  ? Procedure: TRANSESOPHAGEAL ECHOCARDIOGRAM (TEE);  Surgeon: [name], MD;  Location: [facility];  Service: Open Heart Surgery;  Laterality: N/A;  ? UPPER GASTROINTESTINAL ENDOSCOPY    ? URETHRAL SLING N/A 09/29/2013  ? Procedure: MALE SLING;  Surgeon: [name], MD;  [facility];  Service: Urology;   Laterality: N/A;  ? ? ?There were no vitals filed for this visit. ? ? Subjective Assessment - 09/11/21 1532   ? ? Subjective Patient reports doing okay - feels like his balance is improving but reports some difficulty with prolonged standing and talking to church members- felt unsteady after standing for several minutes.   ? Pertinent History Patient is a 81 year old male with diagnosis of Parkinsons referred to outpatient PT for difficulty with balance  with recent history of falling. He reports 3 falls in past 6 months. He states he does not use any assistive device and noticing more shuffling nature of walkng. 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   ? Limitations Lifting;Standing;Walking;House hold activities   ? How long can you sit comfortably? no issues   ? How long can you stand comfortably? <1 hour   ? How long can you walk comfortably? <30 min   ? Patient Stated Goals My main goal is to improve my balance so I don't fall.   ? Currently in Pain? No/denies   ? Pain Type Chronic pain   ? ?  ?  ? ?  ? ? ?Patient's goals assessed for recertification today:  ? ? [facility] PT Assessment - 09/11/21 1423   ? ?  ? Berg Balance Test  ? Sit to Stand Able to stand without using hands and stabilize independently   ? Standing Unsupported Able to stand safely 2 minutes   ? Sitting with Back Unsupported but Feet Supported on Floor or Stool Able to sit safely and securely 2 minutes   ? Stand to Sit Sits safely with minimal use of hands   ? Transfers Able to transfer safely, minor use of hands   ? Standing Unsupported with Eyes Closed Able to stand 10 seconds safely   ? Standing Unsupported with Feet Together Able to place feet together independently and stand 1 minute safely   ? From Standing, Reach Forward with Outstretched Arm Can reach confidently >25 cm (10")   ? From Standing Position, Pick up Object from [place] to pick up shoe safely and easily   ? From Standing Position, Turn to Look  Behind Over each Shoulder Looks behind from both sides and weight shifts well   ? Turn 360 Degrees Able to turn 360 degrees safely in 4 seconds or less   ? Standing Unsupported, Alternately Place Feet on Step/Stool Able to stand independently and safely and complete 8 steps in 20 seconds   ? Standing Unsupported, One Foot in [place] to place foot tandem independently and hold 30 seconds   ? Standing on One Leg Able to lift leg independently and hold equal to or more than 3 seconds   ? Total Score 54   ?  ? Functional Gait  Assessment  ? Gait assessed  Yes   ? Gait Level Surface Walks 20 ft in less than 5.5 sec, no assistive devices, good speed,  no evidence for imbalance, normal gait pattern, deviates no more than 6 in outside of the 12 in walkway width.   ? Change in Gait Speed Able to smoothly change walking speed without loss of balance or gait deviation. Deviate no more than 6 in outside of the 12 in walkway width.   ? Gait with Horizontal Head Turns Performs head turns smoothly with slight change in gait velocity (eg, minor disruption to smooth gait path), deviates 6-10 in outside 12 in walkway width, or uses an assistive device.   ? Gait with Vertical Head Turns Performs task with slight change in gait velocity (eg, minor disruption to smooth gait path), deviates 6 - 10 in outside 12 in walkway width or uses assistive device   ? Gait and Pivot Turn Pivot turns safely within 3 sec and stops quickly with no loss of balance.   ? Step Over Obstacle Is able to step over one shoe box (4.5 in total height) without changing gait speed. No evidence of imbalance.   ? Gait with Narrow Base of Support Is able to ambulate for 10 steps heel to toe with no staggering.   ? Gait with Eyes Closed Walks 20 ft, no assistive devices, good speed, no evidence of imbalance, normal gait pattern, deviates no more than 6 in outside 12 in walkway width. Ambulates 20 ft in less than 7 sec.   ? Ambulating Backwards Walks 20 ft, no  assistive devices, good speed, no evidence for imbalance, normal gait   ? Steps Alternating feet, no rail.   ? Total Score 27   ? ?  ?  ? ?  ? ? ? ? ?BERG= 54/56 ?TUG= 11.10 sec without UE support (achieved)  ? ?10 Meter

## 2021-09-13 ENCOUNTER — Ambulatory Visit: Payer: Medicare Other

## 2021-09-13 DIAGNOSIS — R42 Dizziness and giddiness: Secondary | ICD-10-CM

## 2021-09-13 DIAGNOSIS — R2681 Unsteadiness on feet: Secondary | ICD-10-CM
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
## 2021-09-18 ENCOUNTER — Ambulatory Visit: Payer: Medicare Other

## 2021-09-18 DIAGNOSIS — R278 Other lack of coordination: Secondary | ICD-10-CM

## 2021-09-18 DIAGNOSIS — R2681 Unsteadiness on feet: Secondary | ICD-10-CM

## 2021-09-18 DIAGNOSIS — M6281 Muscle weakness (generalized): Secondary | ICD-10-CM

## 2021-09-18 DIAGNOSIS — R42 Dizziness and giddiness: Secondary | ICD-10-CM
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
## 2021-09-20 ENCOUNTER — Ambulatory Visit: Payer: Medicare Other

## 2021-09-20 DIAGNOSIS — R42 Dizziness and giddiness: Secondary | ICD-10-CM | POA: Diagnosis not present

## 2021-09-20 DIAGNOSIS — R2681 Unsteadiness on feet: Secondary | ICD-10-CM

## 2021-09-20 DIAGNOSIS — R269 Unspecified abnormalities of gait and mobility: Secondary | ICD-10-CM

## 2021-09-20 DIAGNOSIS — R278 Other lack of coordination: Secondary | ICD-10-CM

## 2021-09-20 DIAGNOSIS — M6281 Muscle weakness (generalized): Secondary | ICD-10-CM

## 2021-09-20 DIAGNOSIS — R262 Difficulty in walking, not elsewhere classified: Secondary | ICD-10-CM
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
## 2021-09-26 ENCOUNTER — Ambulatory Visit: Payer: Medicare Other

## 2021-09-26 DIAGNOSIS — R269 Unspecified abnormalities of gait and mobility: Secondary | ICD-10-CM

## 2021-09-26 DIAGNOSIS — R2681 Unsteadiness on feet: Secondary | ICD-10-CM

## 2021-09-26 DIAGNOSIS — R42 Dizziness and giddiness: Secondary | ICD-10-CM

## 2021-09-26 DIAGNOSIS — R278 Other lack of coordination: Secondary | ICD-10-CM

## 2021-09-26 DIAGNOSIS — M6281 Muscle weakness (generalized): Secondary | ICD-10-CM
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
## 2021-09-28 ENCOUNTER — Ambulatory Visit: Payer: Medicare Other | Attending: Neurology | Admitting: Physical Therapy

## 2021-09-28 ENCOUNTER — Encounter: Payer: Self-pay | Admitting: Physical Therapy

## 2021-09-28 DIAGNOSIS — M6281 Muscle weakness (generalized): Secondary | ICD-10-CM | POA: Diagnosis present

## 2021-09-28 DIAGNOSIS — R278 Other lack of coordination: Secondary | ICD-10-CM | POA: Diagnosis present

## 2021-09-28 DIAGNOSIS — R262 Difficulty in walking, not elsewhere classified: Secondary | ICD-10-CM | POA: Insufficient documentation

## 2021-09-28 DIAGNOSIS — R42 Dizziness and giddiness: Secondary | ICD-10-CM | POA: Diagnosis present

## 2021-09-28 DIAGNOSIS — R269 Unspecified abnormalities of gait and mobility: Secondary | ICD-10-CM | POA: Diagnosis present

## 2021-09-28 DIAGNOSIS — R2681 Unsteadiness on feet: Secondary | ICD-10-CM | POA: Insufficient documentation
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
## 2021-10-02 ENCOUNTER — Ambulatory Visit: Payer: Medicare Other

## 2021-10-02 DIAGNOSIS — R2681 Unsteadiness on feet: Secondary | ICD-10-CM | POA: Diagnosis not present

## 2021-10-02 DIAGNOSIS — R262 Difficulty in walking, not elsewhere classified: Secondary | ICD-10-CM

## 2021-10-02 DIAGNOSIS — R278 Other lack of coordination: Secondary | ICD-10-CM
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
## 2021-10-04 ENCOUNTER — Ambulatory Visit: Payer: Medicare Other

## 2021-10-04 DIAGNOSIS — R2681 Unsteadiness on feet: Secondary | ICD-10-CM

## 2021-10-04 DIAGNOSIS — M6281 Muscle weakness (generalized): Secondary | ICD-10-CM

## 2021-10-04 DIAGNOSIS — R262 Difficulty in walking, not elsewhere classified: Secondary | ICD-10-CM
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
## 2021-10-09 ENCOUNTER — Ambulatory Visit: Payer: Medicare Other | Admitting: Physical Therapy

## 2021-10-09 DIAGNOSIS — R262 Difficulty in walking, not elsewhere classified: Secondary | ICD-10-CM

## 2021-10-09 DIAGNOSIS — M6281 Muscle weakness (generalized): Secondary | ICD-10-CM

## 2021-10-09 DIAGNOSIS — R2681 Unsteadiness on feet: Secondary | ICD-10-CM

## 2021-10-09 DIAGNOSIS — R278 Other lack of coordination: Secondary | ICD-10-CM
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
## 2021-10-11 ENCOUNTER — Ambulatory Visit: Payer: Medicare Other

## 2021-10-11 DIAGNOSIS — R42 Dizziness and giddiness: Secondary | ICD-10-CM

## 2021-10-11 DIAGNOSIS — R2681 Unsteadiness on feet: Secondary | ICD-10-CM | POA: Diagnosis not present
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
## 2021-10-16 ENCOUNTER — Ambulatory Visit: Payer: Medicare Other

## 2021-10-16 DIAGNOSIS — R2681 Unsteadiness on feet: Secondary | ICD-10-CM | POA: Diagnosis not present

## 2021-10-16 DIAGNOSIS — M6281 Muscle weakness (generalized): Secondary | ICD-10-CM

## 2021-10-16 DIAGNOSIS — R262 Difficulty in walking, not elsewhere classified: Secondary | ICD-10-CM

## 2021-10-16 DIAGNOSIS — R42 Dizziness and giddiness: Secondary | ICD-10-CM
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
## 2021-10-18 ENCOUNTER — Ambulatory Visit: Payer: Medicare Other | Admitting: Physical Therapy

## 2021-10-23 ENCOUNTER — Ambulatory Visit: Payer: Medicare Other

## 2021-10-23 DIAGNOSIS — M6281 Muscle weakness (generalized): Secondary | ICD-10-CM

## 2021-10-23 DIAGNOSIS — R2681 Unsteadiness on feet: Secondary | ICD-10-CM | POA: Diagnosis not present

## 2021-10-23 DIAGNOSIS — R262 Difficulty in walking, not elsewhere classified: Secondary | ICD-10-CM

## 2021-10-23 DIAGNOSIS — R42 Dizziness and giddiness: Secondary | ICD-10-CM
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
## 2021-10-25 ENCOUNTER — Ambulatory Visit: Payer: Medicare Other

## 2021-10-25 DIAGNOSIS — R2681 Unsteadiness on feet: Secondary | ICD-10-CM | POA: Diagnosis not present

## 2021-10-25 DIAGNOSIS — R262 Difficulty in walking, not elsewhere classified: Secondary | ICD-10-CM

## 2021-10-25 DIAGNOSIS — M6281 Muscle weakness (generalized): Secondary | ICD-10-CM

## 2021-10-25 DIAGNOSIS — R42 Dizziness and giddiness: Secondary | ICD-10-CM
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
## 2021-10-30 ENCOUNTER — Ambulatory Visit: Payer: Medicare Other | Attending: Neurology | Admitting: Physical Therapy

## 2021-10-30 ENCOUNTER — Encounter: Payer: Self-pay | Admitting: Physical Therapy

## 2021-10-30 DIAGNOSIS — R262 Difficulty in walking, not elsewhere classified: Secondary | ICD-10-CM | POA: Insufficient documentation

## 2021-10-30 DIAGNOSIS — R42 Dizziness and giddiness: Secondary | ICD-10-CM | POA: Diagnosis present

## 2021-10-30 DIAGNOSIS — R2681 Unsteadiness on feet: Secondary | ICD-10-CM | POA: Insufficient documentation

## 2021-10-30 DIAGNOSIS — R269 Unspecified abnormalities of gait and mobility: Secondary | ICD-10-CM | POA: Insufficient documentation

## 2021-10-30 DIAGNOSIS — R278 Other lack of coordination: Secondary | ICD-10-CM | POA: Diagnosis present

## 2021-10-30 DIAGNOSIS — M6281 Muscle weakness (generalized): Secondary | ICD-10-CM | POA: Diagnosis present
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
## 2021-11-01 ENCOUNTER — Ambulatory Visit: Payer: Medicare Other

## 2021-11-01 DIAGNOSIS — R262 Difficulty in walking, not elsewhere classified: Secondary | ICD-10-CM

## 2021-11-01 DIAGNOSIS — R42 Dizziness and giddiness: Secondary | ICD-10-CM

## 2021-11-01 DIAGNOSIS — R2681 Unsteadiness on feet: Secondary | ICD-10-CM

## 2021-11-01 DIAGNOSIS — M6281 Muscle weakness (generalized): Secondary | ICD-10-CM
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
## 2021-11-06 ENCOUNTER — Ambulatory Visit: Payer: Medicare Other

## 2021-11-06 DIAGNOSIS — R2681 Unsteadiness on feet: Secondary | ICD-10-CM

## 2021-11-06 DIAGNOSIS — R42 Dizziness and giddiness: Secondary | ICD-10-CM

## 2021-11-06 DIAGNOSIS — R262 Difficulty in walking, not elsewhere classified: Secondary | ICD-10-CM

## 2021-11-06 DIAGNOSIS — M6281 Muscle weakness (generalized): Secondary | ICD-10-CM

## 2021-11-06 DIAGNOSIS — R269 Unspecified abnormalities of gait and mobility: Secondary | ICD-10-CM
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
## 2021-11-08 ENCOUNTER — Ambulatory Visit: Payer: Medicare Other | Admitting: Physical Therapy

## 2021-11-08 DIAGNOSIS — M6281 Muscle weakness (generalized): Secondary | ICD-10-CM

## 2021-11-08 DIAGNOSIS — R2681 Unsteadiness on feet: Secondary | ICD-10-CM | POA: Diagnosis not present

## 2021-11-08 DIAGNOSIS — R262 Difficulty in walking, not elsewhere classified: Secondary | ICD-10-CM
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
## 2021-11-13 ENCOUNTER — Ambulatory Visit: Payer: Medicare Other

## 2021-11-13 DIAGNOSIS — M6281 Muscle weakness (generalized): Secondary | ICD-10-CM

## 2021-11-13 DIAGNOSIS — R42 Dizziness and giddiness: Secondary | ICD-10-CM

## 2021-11-13 DIAGNOSIS — R262 Difficulty in walking, not elsewhere classified: Secondary | ICD-10-CM

## 2021-11-13 DIAGNOSIS — R2681 Unsteadiness on feet: Secondary | ICD-10-CM
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
## 2021-11-15 ENCOUNTER — Encounter: Payer: Self-pay | Admitting: Physical Therapy

## 2021-11-15 ENCOUNTER — Ambulatory Visit: Payer: Medicare Other | Admitting: Physical Therapy

## 2021-11-15 DIAGNOSIS — R278 Other lack of coordination: Secondary | ICD-10-CM

## 2021-11-15 DIAGNOSIS — R42 Dizziness and giddiness: Secondary | ICD-10-CM

## 2021-11-15 DIAGNOSIS — M6281 Muscle weakness (generalized): Secondary | ICD-10-CM

## 2021-11-15 DIAGNOSIS — R2681 Unsteadiness on feet: Secondary | ICD-10-CM

## 2021-11-15 DIAGNOSIS — R269 Unspecified abnormalities of gait and mobility: Secondary | ICD-10-CM

## 2021-11-15 DIAGNOSIS — R262 Difficulty in walking, not elsewhere classified: Secondary | ICD-10-CM
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
## 2021-11-20 ENCOUNTER — Ambulatory Visit: Payer: Medicare Other | Admitting: Physical Therapy

## 2021-11-22 ENCOUNTER — Ambulatory Visit: Payer: Medicare Other

## 2021-11-27 ENCOUNTER — Ambulatory Visit: Payer: Medicare Other | Admitting: Physical Therapy

## 2021-11-28 ENCOUNTER — Emergency Department: Payer: Medicare Other

## 2021-11-28 ENCOUNTER — Emergency Department
Admission: EM | Admit: 2021-11-28 | Discharge: 2021-11-29 | Disposition: A | Discharge status: Home / Self Care | Payer: Medicare Other | Attending: Emergency Medicine | Admitting: Emergency Medicine

## 2021-11-28 ENCOUNTER — Encounter: Payer: Self-pay | Admitting: Emergency Medicine

## 2021-11-28 DIAGNOSIS — N189 Chronic kidney disease, unspecified: Secondary | ICD-10-CM | POA: Diagnosis not present

## 2021-11-28 DIAGNOSIS — W01198A Fall on same level from slipping, tripping and stumbling with subsequent striking against other object, initial encounter: Secondary | ICD-10-CM | POA: Diagnosis not present

## 2021-11-28 DIAGNOSIS — Z951 Presence of aortocoronary bypass graft: Secondary | ICD-10-CM | POA: Insufficient documentation

## 2021-11-28 DIAGNOSIS — W19XXXA Unspecified fall, initial encounter: Secondary | ICD-10-CM

## 2021-11-28 DIAGNOSIS — Z7901 Long term (current) use of anticoagulants: Secondary | ICD-10-CM | POA: Insufficient documentation

## 2021-11-28 DIAGNOSIS — S0181XA Laceration without foreign body of other part of head, initial encounter: Secondary | ICD-10-CM | POA: Diagnosis not present

## 2021-11-28 DIAGNOSIS — S42202A Unspecified fracture of upper end of left humerus, initial encounter for closed fracture: Secondary | ICD-10-CM | POA: Diagnosis not present

## 2021-11-28 DIAGNOSIS — I2510 Atherosclerotic heart disease of native coronary artery without angina pectoris: Secondary | ICD-10-CM | POA: Diagnosis not present

## 2021-11-28 DIAGNOSIS — S4992XA Unspecified injury of left shoulder and upper arm, initial encounter: Secondary | ICD-10-CM | POA: Diagnosis present

## 2021-11-28 NOTE — ED Notes (Signed)
Pt declined c-collar and pain meds at this time.

## 2021-11-28 NOTE — ED Triage Notes (Signed)
Pt presents via POV with complaints of headache and left arm pain following a fall from 5 ft - pt on eliquis. Pt slipped and fell hitting his left arm on wood. Pt endorses hitting his head on the right side of his face - small laceration to his eye brow. Denies LOC - neck pain or tenderness.

## 2021-11-29 ENCOUNTER — Ambulatory Visit: Payer: Medicare Other | Admitting: Physical Therapy

## 2021-11-29 DIAGNOSIS — S42202A Unspecified fracture of upper end of left humerus, initial encounter for closed fracture: Secondary | ICD-10-CM | POA: Diagnosis not present

## 2021-11-29 MED ORDER — HYDROCODONE-ACETAMINOPHEN 5-325 MG PO TABS: 2.0000 | ORAL_TABLET | Freq: Four times a day (QID) | ORAL | 0 refills | Status: DC | PRN

## 2021-11-29 MED ORDER — DOCUSATE SODIUM 100 MG PO CAPS: ORAL_CAPSULE | ORAL | 0 refills | Status: DC

## 2021-11-29 MED ORDER — HYDROCODONE-ACETAMINOPHEN 5-325 MG PO TABS
2.0000 | ORAL_TABLET | Freq: Once | ORAL | Status: AC
  Administered 2021-11-29: 2 via ORAL
  Filled 2021-11-29: qty 2

## 2021-11-29 NOTE — Discharge Instructions (Signed)
As we discussed, your evaluation was generally reassuring except for the fracture in your left upper arm (known as a proximal humerus fracture).  The bones will take quite a while to heal.  We recommend you follow-up with Dr. Mack Guise with orthopedic surgery, although he may refer you to a different specialist.  In the meantime, use the sling to immobilize your arm is much as possible. Take Norco as prescribed for severe pain. Do not drink alcohol, drive or participate in any other potentially dangerous activities while taking this medication as it may make you sleepy. Do not take this medication with any other sedating medications, either prescription or over-the-counter. If you were prescribed Percocet or Vicodin, do not take these with acetaminophen (Tylenol) as it is already contained within these medications.   This medication is an opiate (or narcotic) pain medication and can be habit forming.  Use it as little as possible to achieve adequate pain control.  Do not use or use it with extreme caution if you have a history of opiate abuse or dependence.  If you are on a pain contract with your primary care doctor or a pain specialist, be sure to let them know you were prescribed this medication today from the [facility] Emergency Department.  This medication is intended for your use only - do not give any to anyone else and keep it in a secure place where nobody else, especially children, have access to it.  It will also cause or worsen constipation, so you may want to consider taking an over-the-counter stool softener while you are taking this medication.  Continue taking your other medications.  You can also follow-up with your primary care doctor for additional assistance managing your symptoms.  Return to the emergency department if you develop new or worsening symptoms that concern you.

## 2021-11-29 NOTE — ED Provider Notes (Addendum)
[facility] Provider Note    Event Date/Time   First MD Initiated Contact with Patient 11/29/21 0025     (approximate)   History   Fall   HPI  Victor Gould is a 81 y.o. male with a history of ACS/CAD status post CABG, A-fib,  and CHF and chronic kidney disease who is on Eliquis.  He presents after a fall.  He says that he was on a board which she was using as a scaffold, and the board broke, dropping him about 5 feet.  He did not lose consciousness but he struck the right side of his head (lateral to the right eyebrow) on a piece of the wood.  He landed on his left arm or shoulder and the majority of his pain is in the left shoulder.  It is fine at rest but it hurts significantly when he tries to move around.  He can flex and extend his elbow and his wrist and use his fingers without difficulty, it is just the movement of the shoulder that is the problem.  No neck pain, no chest pain, no shortness of breath, no loss of consciousness, no nausea nor vomiting.     Physical Exam   Triage Vital Signs: ED Triage Vitals  Enc Vitals Group     BP 11/28/21 2205 (!) 176/90     Pulse Rate 11/28/21 2205 86     Resp 11/28/21 2205 20     Temp 11/28/21 2205 98 F (36.7 C)     Temp Source 11/28/21 2205 Oral     SpO2 11/28/21 2205 98 %     Weight 11/28/21 2203 78 kg (171 lb 15.3 oz)     Height 11/28/21 2203 1.727 m ('5\' 8"'$ )     Head Circumference --      Peak Flow --      Pain Score 11/28/21 2203 7     Pain Loc --      Pain Edu? --      Excl. in [place]? --     Most recent vital signs: Vitals:   11/28/21 2205 11/29/21 0120  BP: (!) 176/90 (!) 180/81  Pulse: 86 88  Resp: 20 18  Temp: 98 F (36.7 C) 98.4 F (36.9 C)  SpO2: 98% 94%     General: Awake, no distress in spite of his fall and injury. CV:  Good peripheral perfusion.  Resp:  Normal effort.  Abd:  No distention.  Other:  Small 3 mm superficial laceration to the right temporal region, lateral to  his right eyebrow, no active bleeding, no foreign bodies.  Patient has some swelling of the left proximal humerus but relatively mild and without obvious or gross deformity.  Significant pain with any amount of movement.  Easily palpable left radial pulse.  No other evidence of orthopedic injury.   ED Results / Procedures / Treatments   Labs (all labs ordered are listed, but only abnormal results are displayed) Labs Reviewed - No data to display   RADIOLOGY I viewed and interpreted the patient's left shoulder and humerus x-rays.  He has a comminuted left proximal humerus that extends into the humeral head.  Confirmed by the radiologist report.  I also viewed and interpreted the patient's head CT and cervical spine CT.  No evidence of acute fracture or intracranial bleed.  Confirmed by radiologist.    PROCEDURES:  Critical Care performed: No  ..Laceration Repair  Date/Time: 11/29/2021 2:40 AM  Performed by:  Hinda Kehr, MD Authorized by: Hinda Kehr, MD   Consent:    Consent obtained:  Verbal   Consent given by:  Patient Anesthesia:    Anesthesia method:  None Laceration details:    Location:  Face   Facial location: right temporal region, lateral to right eyebrow.   Length (cm):  0.3 Exploration:    Contaminated: no   Treatment:    Amount of cleaning:  Standard   Irrigation solution:  Sterile saline   Visualized foreign bodies/material removed: no   Skin repair:    Repair method:  Tissue adhesive Approximation:    Approximation:  Close Repair type:    Repair type:  Simple Post-procedure details:    Dressing:  Open (no dressing)   Procedure completion:  Tolerated well, no immediate complications    MEDICATIONS ORDERED IN ED: Medications  HYDROcodone-acetaminophen (NORCO/VICODIN) 5-325 MG per tablet 2 tablet (2 tablets Oral Given 11/29/21 0230)     IMPRESSION / MDM / ASSESSMENT AND PLAN / ED COURSE  I reviewed the triage vital signs and the nursing notes.                               Differential diagnosis includes, but is not limited to, fracture, dislocation, intracranial bleed, cervical spine injury.  Patient's presentation is most consistent with acute presentation with potential threat to life or bodily function.  Studies ordered include left humerus x-rays, left shoulder x-rays, head CT, and cervical spine CT.  As documented above, head and cervical spine CTs are reassuring.  However, the patient has a comminuted fracture of the left proximal humerus.  Given the involvement of the humeral head, I consulted by phone with Dr. Mack Guise with orthopedic surgery.  He confirmed that the immediate management is no different, sling and swath for immobilization, and outpatient follow-up.  However he said that the patient may benefit from surgical fixation and that Dr. Mack Guise will likely refer the patient to trauma orthopedics in [place].  If so, they will contact the patient.  Patient understands and agrees with the plan.  Analgesia prescription as listed below, and I gave 2 Norco in the emergency department.  Patient's blood pressure is elevated but this is likely situational and he has not had his evening medications.  Appropriate for outpatient follow-up.  Initially considered hospitalization but the patient's pain is well controlled and he has no other sign of an acute or emergent condition at this time.       FINAL CLINICAL IMPRESSION(S) / ED DIAGNOSES   Final diagnoses:  Fall, initial encounter  Facial laceration, initial encounter  Closed fracture of proximal end of left humerus, unspecified fracture morphology, initial encounter     Rx / DC Orders   ED Discharge Orders          Ordered    HYDROcodone-acetaminophen (NORCO/VICODIN) 5-325 MG tablet  Every 6 hours PRN        11/29/21 0222    docusate sodium (COLACE) 100 MG capsule        11/29/21 0222             Note:  This document was prepared using Dragon voice  recognition software and may include unintentional dictation errors.   Hinda Kehr, MD 11/29/21 2947    Hinda Kehr, MD 11/29/21 [id number]

## 2021-12-04 ENCOUNTER — Ambulatory Visit: Payer: Medicare Other | Admitting: Physical Therapy

## 2021-12-06 ENCOUNTER — Ambulatory Visit: Payer: Medicare Other | Admitting: Physical Therapy

## 2021-12-11 ENCOUNTER — Ambulatory Visit: Payer: Medicare Other | Admitting: Physical Therapy

## 2021-12-13 ENCOUNTER — Ambulatory Visit: Payer: Medicare Other | Admitting: Physical Therapy

## 2021-12-18 ENCOUNTER — Ambulatory Visit: Payer: Medicare Other | Admitting: Physical Therapy

## 2021-12-20 ENCOUNTER — Ambulatory Visit: Payer: Medicare Other | Admitting: Physical Therapy

## 2021-12-25 ENCOUNTER — Ambulatory Visit: Payer: Medicare Other

## 2021-12-27 ENCOUNTER — Ambulatory Visit: Payer: Medicare Other

## 2022-01-03 ENCOUNTER — Ambulatory Visit: Payer: Medicare Other

## 2022-01-03 ENCOUNTER — Ambulatory Visit: Payer: Medicare Other | Attending: Orthopedic Surgery

## 2022-01-03 DIAGNOSIS — R29898 Other symptoms and signs involving the musculoskeletal system: Secondary | ICD-10-CM | POA: Diagnosis present

## 2022-01-03 DIAGNOSIS — R278 Other lack of coordination: Secondary | ICD-10-CM | POA: Insufficient documentation

## 2022-01-03 DIAGNOSIS — M25612 Stiffness of left shoulder, not elsewhere classified: Secondary | ICD-10-CM | POA: Insufficient documentation

## 2022-01-03 DIAGNOSIS — M25512 Pain in left shoulder: Secondary | ICD-10-CM | POA: Diagnosis present

## 2022-01-03 DIAGNOSIS — M6281 Muscle weakness (generalized): Secondary | ICD-10-CM | POA: Insufficient documentation

## 2022-01-03 DIAGNOSIS — R2681 Unsteadiness on feet: Secondary | ICD-10-CM | POA: Diagnosis present
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
## 2022-01-08 ENCOUNTER — Ambulatory Visit: Payer: Medicare Other

## 2022-01-08 DIAGNOSIS — M25512 Pain in left shoulder: Secondary | ICD-10-CM

## 2022-01-08 DIAGNOSIS — R29898 Other symptoms and signs involving the musculoskeletal system: Secondary | ICD-10-CM

## 2022-01-08 DIAGNOSIS — M25612 Stiffness of left shoulder, not elsewhere classified: Secondary | ICD-10-CM
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
## 2022-01-10 ENCOUNTER — Ambulatory Visit: Payer: Medicare Other

## 2022-01-10 ENCOUNTER — Encounter: Payer: Self-pay | Admitting: Physical Therapy

## 2022-01-10 ENCOUNTER — Ambulatory Visit: Payer: Medicare Other | Admitting: Physical Therapy

## 2022-01-10 DIAGNOSIS — R2681 Unsteadiness on feet: Secondary | ICD-10-CM

## 2022-01-10 DIAGNOSIS — M25612 Stiffness of left shoulder, not elsewhere classified: Secondary | ICD-10-CM

## 2022-01-10 DIAGNOSIS — M25512 Pain in left shoulder: Secondary | ICD-10-CM | POA: Diagnosis not present

## 2022-01-10 DIAGNOSIS — R29898 Other symptoms and signs involving the musculoskeletal system: Secondary | ICD-10-CM
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
## 2022-01-15 ENCOUNTER — Ambulatory Visit: Payer: Medicare Other

## 2022-01-15 DIAGNOSIS — M25512 Pain in left shoulder: Secondary | ICD-10-CM

## 2022-01-15 DIAGNOSIS — R29898 Other symptoms and signs involving the musculoskeletal system: Secondary | ICD-10-CM

## 2022-01-15 DIAGNOSIS — M25612 Stiffness of left shoulder, not elsewhere classified: Secondary | ICD-10-CM
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
## 2022-01-17 ENCOUNTER — Ambulatory Visit: Payer: Medicare Other | Admitting: Physical Therapy

## 2022-01-17 ENCOUNTER — Ambulatory Visit: Payer: Medicare Other

## 2022-01-17 DIAGNOSIS — M25512 Pain in left shoulder: Secondary | ICD-10-CM

## 2022-01-17 DIAGNOSIS — M25612 Stiffness of left shoulder, not elsewhere classified: Secondary | ICD-10-CM

## 2022-01-17 DIAGNOSIS — R29898 Other symptoms and signs involving the musculoskeletal system: Secondary | ICD-10-CM
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
## 2022-01-22 ENCOUNTER — Encounter: Payer: Self-pay | Admitting: Occupational Therapy

## 2022-01-22 ENCOUNTER — Ambulatory Visit: Payer: Medicare Other | Admitting: Occupational Therapy

## 2022-01-22 ENCOUNTER — Ambulatory Visit: Payer: Medicare Other

## 2022-01-22 DIAGNOSIS — R278 Other lack of coordination: Secondary | ICD-10-CM

## 2022-01-22 DIAGNOSIS — R29898 Other symptoms and signs involving the musculoskeletal system: Secondary | ICD-10-CM

## 2022-01-22 DIAGNOSIS — M25612 Stiffness of left shoulder, not elsewhere classified: Secondary | ICD-10-CM

## 2022-01-22 DIAGNOSIS — M6281 Muscle weakness (generalized): Secondary | ICD-10-CM

## 2022-01-22 DIAGNOSIS — M25512 Pain in left shoulder: Secondary | ICD-10-CM
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
## 2022-01-24 ENCOUNTER — Ambulatory Visit: Payer: Medicare Other

## 2022-01-24 ENCOUNTER — Encounter: Payer: Self-pay | Admitting: Physical Therapy

## 2022-01-24 ENCOUNTER — Ambulatory Visit: Payer: Medicare Other | Admitting: Physical Therapy

## 2022-01-24 DIAGNOSIS — M25512 Pain in left shoulder: Secondary | ICD-10-CM | POA: Diagnosis not present

## 2022-01-24 DIAGNOSIS — R29898 Other symptoms and signs involving the musculoskeletal system: Secondary | ICD-10-CM

## 2022-01-24 DIAGNOSIS — M25612 Stiffness of left shoulder, not elsewhere classified: Secondary | ICD-10-CM

## 2022-01-24 DIAGNOSIS — M6281 Muscle weakness (generalized): Secondary | ICD-10-CM
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
## 2022-01-29 ENCOUNTER — Ambulatory Visit: Payer: Medicare Other | Attending: Orthopedic Surgery

## 2022-01-29 ENCOUNTER — Ambulatory Visit: Payer: Medicare Other | Admitting: Occupational Therapy

## 2022-01-29 ENCOUNTER — Encounter: Payer: Self-pay | Admitting: Occupational Therapy

## 2022-01-29 ENCOUNTER — Ambulatory Visit: Payer: Medicare Other

## 2022-01-29 DIAGNOSIS — M25512 Pain in left shoulder: Secondary | ICD-10-CM | POA: Diagnosis present

## 2022-01-29 DIAGNOSIS — R269 Unspecified abnormalities of gait and mobility: Secondary | ICD-10-CM | POA: Insufficient documentation

## 2022-01-29 DIAGNOSIS — R2681 Unsteadiness on feet: Secondary | ICD-10-CM | POA: Insufficient documentation

## 2022-01-29 DIAGNOSIS — M6281 Muscle weakness (generalized): Secondary | ICD-10-CM | POA: Diagnosis present

## 2022-01-29 DIAGNOSIS — R278 Other lack of coordination: Secondary | ICD-10-CM | POA: Insufficient documentation

## 2022-01-29 DIAGNOSIS — R29898 Other symptoms and signs involving the musculoskeletal system: Secondary | ICD-10-CM | POA: Diagnosis present

## 2022-01-29 DIAGNOSIS — R262 Difficulty in walking, not elsewhere classified: Secondary | ICD-10-CM | POA: Diagnosis present

## 2022-01-29 DIAGNOSIS — M25612 Stiffness of left shoulder, not elsewhere classified: Secondary | ICD-10-CM | POA: Insufficient documentation
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
## 2022-01-31 ENCOUNTER — Ambulatory Visit: Payer: Medicare Other | Admitting: Physical Therapy

## 2022-01-31 ENCOUNTER — Ambulatory Visit: Payer: Medicare Other

## 2022-01-31 ENCOUNTER — Ambulatory Visit: Payer: Medicare Other | Admitting: Occupational Therapy

## 2022-01-31 DIAGNOSIS — M6281 Muscle weakness (generalized): Secondary | ICD-10-CM

## 2022-01-31 DIAGNOSIS — R29898 Other symptoms and signs involving the musculoskeletal system: Secondary | ICD-10-CM

## 2022-01-31 DIAGNOSIS — M25612 Stiffness of left shoulder, not elsewhere classified: Secondary | ICD-10-CM

## 2022-01-31 DIAGNOSIS — R278 Other lack of coordination: Secondary | ICD-10-CM

## 2022-01-31 DIAGNOSIS — M25512 Pain in left shoulder: Secondary | ICD-10-CM
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
## 2022-02-05 ENCOUNTER — Ambulatory Visit: Payer: Medicare Other | Admitting: Occupational Therapy

## 2022-02-05 ENCOUNTER — Encounter: Payer: Self-pay | Admitting: Physical Therapy

## 2022-02-05 ENCOUNTER — Ambulatory Visit: Payer: Medicare Other

## 2022-02-05 ENCOUNTER — Encounter: Payer: Self-pay | Admitting: Occupational Therapy

## 2022-02-05 DIAGNOSIS — M25612 Stiffness of left shoulder, not elsewhere classified: Secondary | ICD-10-CM

## 2022-02-05 DIAGNOSIS — M6281 Muscle weakness (generalized): Secondary | ICD-10-CM

## 2022-02-05 DIAGNOSIS — R29898 Other symptoms and signs involving the musculoskeletal system: Secondary | ICD-10-CM

## 2022-02-05 DIAGNOSIS — M25512 Pain in left shoulder: Secondary | ICD-10-CM
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
## 2022-02-07 ENCOUNTER — Ambulatory Visit: Payer: Medicare Other | Admitting: Occupational Therapy

## 2022-02-07 ENCOUNTER — Ambulatory Visit: Payer: Medicare Other | Admitting: Physical Therapy

## 2022-02-07 ENCOUNTER — Encounter: Payer: Self-pay | Admitting: Physical Therapy

## 2022-02-07 ENCOUNTER — Ambulatory Visit: Payer: Medicare Other

## 2022-02-07 DIAGNOSIS — M6281 Muscle weakness (generalized): Secondary | ICD-10-CM

## 2022-02-07 DIAGNOSIS — R29898 Other symptoms and signs involving the musculoskeletal system: Secondary | ICD-10-CM

## 2022-02-07 DIAGNOSIS — M25612 Stiffness of left shoulder, not elsewhere classified: Secondary | ICD-10-CM

## 2022-02-07 DIAGNOSIS — M25512 Pain in left shoulder: Secondary | ICD-10-CM
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
## 2022-02-12 ENCOUNTER — Ambulatory Visit: Payer: Medicare Other | Admitting: Occupational Therapy

## 2022-02-12 ENCOUNTER — Ambulatory Visit: Payer: Medicare Other

## 2022-02-12 DIAGNOSIS — M6281 Muscle weakness (generalized): Secondary | ICD-10-CM | POA: Diagnosis not present

## 2022-02-12 DIAGNOSIS — R29898 Other symptoms and signs involving the musculoskeletal system: Secondary | ICD-10-CM

## 2022-02-12 DIAGNOSIS — M25512 Pain in left shoulder: Secondary | ICD-10-CM

## 2022-02-12 DIAGNOSIS — M25612 Stiffness of left shoulder, not elsewhere classified: Secondary | ICD-10-CM
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
## 2022-02-14 ENCOUNTER — Ambulatory Visit: Payer: Medicare Other

## 2022-02-14 ENCOUNTER — Ambulatory Visit: Payer: Medicare Other | Admitting: Occupational Therapy

## 2022-02-14 DIAGNOSIS — R29898 Other symptoms and signs involving the musculoskeletal system: Secondary | ICD-10-CM

## 2022-02-14 DIAGNOSIS — M6281 Muscle weakness (generalized): Secondary | ICD-10-CM

## 2022-02-14 DIAGNOSIS — M25512 Pain in left shoulder: Secondary | ICD-10-CM

## 2022-02-14 DIAGNOSIS — M25612 Stiffness of left shoulder, not elsewhere classified: Secondary | ICD-10-CM
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
## 2022-02-19 ENCOUNTER — Ambulatory Visit: Payer: Medicare Other

## 2022-02-20 ENCOUNTER — Ambulatory Visit: Payer: Medicare Other

## 2022-02-20 ENCOUNTER — Encounter: Payer: Medicare Other | Admitting: Occupational Therapy

## 2022-02-20 DIAGNOSIS — M6281 Muscle weakness (generalized): Secondary | ICD-10-CM

## 2022-02-20 DIAGNOSIS — M25512 Pain in left shoulder: Secondary | ICD-10-CM

## 2022-02-20 DIAGNOSIS — R29898 Other symptoms and signs involving the musculoskeletal system: Secondary | ICD-10-CM

## 2022-02-20 DIAGNOSIS — M25612 Stiffness of left shoulder, not elsewhere classified: Secondary | ICD-10-CM
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
## 2022-02-21 ENCOUNTER — Encounter: Payer: Medicare Other | Admitting: Physical Therapy

## 2022-02-21 ENCOUNTER — Ambulatory Visit: Payer: Medicare Other

## 2022-02-21 ENCOUNTER — Encounter: Payer: Medicare Other | Admitting: Occupational Therapy

## 2022-02-22 ENCOUNTER — Ambulatory Visit: Payer: Medicare Other

## 2022-02-22 DIAGNOSIS — R29898 Other symptoms and signs involving the musculoskeletal system: Secondary | ICD-10-CM

## 2022-02-22 DIAGNOSIS — M25512 Pain in left shoulder: Secondary | ICD-10-CM

## 2022-02-22 DIAGNOSIS — M6281 Muscle weakness (generalized): Secondary | ICD-10-CM | POA: Diagnosis not present

## 2022-02-22 DIAGNOSIS — M25612 Stiffness of left shoulder, not elsewhere classified: Secondary | ICD-10-CM
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
## 2022-02-26 ENCOUNTER — Ambulatory Visit: Payer: Medicare Other

## 2022-02-26 ENCOUNTER — Encounter: Payer: Medicare Other | Admitting: Occupational Therapy

## 2022-02-26 DIAGNOSIS — R29898 Other symptoms and signs involving the musculoskeletal system: Secondary | ICD-10-CM

## 2022-02-26 DIAGNOSIS — M6281 Muscle weakness (generalized): Secondary | ICD-10-CM | POA: Diagnosis not present

## 2022-02-26 DIAGNOSIS — M25512 Pain in left shoulder: Secondary | ICD-10-CM

## 2022-02-26 DIAGNOSIS — M25612 Stiffness of left shoulder, not elsewhere classified: Secondary | ICD-10-CM

## 2022-02-28 ENCOUNTER — Ambulatory Visit: Payer: Medicare Other

## 2022-02-28 ENCOUNTER — Ambulatory Visit: Payer: Medicare Other | Admitting: Physical Therapy

## 2022-02-28 ENCOUNTER — Encounter: Payer: Medicare Other | Admitting: Occupational Therapy

## 2022-03-05 ENCOUNTER — Ambulatory Visit: Payer: Medicare Other

## 2022-03-05 ENCOUNTER — Encounter: Payer: Medicare Other | Admitting: Occupational Therapy

## 2022-03-07 ENCOUNTER — Encounter: Payer: Medicare Other | Admitting: Occupational Therapy

## 2022-03-07 ENCOUNTER — Encounter: Payer: Medicare Other | Admitting: Physical Therapy

## 2022-03-07 ENCOUNTER — Ambulatory Visit: Payer: Medicare Other

## 2022-03-12 ENCOUNTER — Encounter: Payer: Medicare Other | Admitting: Occupational Therapy

## 2022-03-12 ENCOUNTER — Ambulatory Visit: Payer: Medicare Other

## 2022-03-14 ENCOUNTER — Ambulatory Visit: Payer: Medicare Other

## 2022-03-14 ENCOUNTER — Encounter: Payer: Medicare Other | Admitting: Occupational Therapy

## 2022-03-14 ENCOUNTER — Encounter: Payer: Medicare Other | Admitting: Physical Therapy

## 2022-03-19 ENCOUNTER — Ambulatory Visit: Payer: Medicare Other

## 2022-03-19 ENCOUNTER — Encounter: Payer: Medicare Other | Admitting: Occupational Therapy

## 2022-03-21 ENCOUNTER — Encounter: Payer: Medicare Other | Admitting: Occupational Therapy

## 2022-03-21 ENCOUNTER — Ambulatory Visit: Payer: Medicare Other

## 2022-03-21 ENCOUNTER — Encounter: Payer: Medicare Other | Admitting: Physical Therapy

## 2022-03-26 ENCOUNTER — Encounter: Payer: Medicare Other | Admitting: Occupational Therapy

## 2022-03-28 ENCOUNTER — Encounter: Payer: Medicare Other | Admitting: Occupational Therapy

## 2022-04-02 ENCOUNTER — Encounter: Payer: Medicare Other | Admitting: Occupational Therapy

## 2022-04-04 ENCOUNTER — Encounter: Payer: Medicare Other | Admitting: Occupational Therapy

## 2022-04-09 ENCOUNTER — Encounter: Payer: Medicare Other | Admitting: Occupational Therapy

## 2022-04-11 ENCOUNTER — Encounter: Payer: Medicare Other | Admitting: Occupational Therapy

## 2022-04-16 ENCOUNTER — Encounter: Payer: Medicare Other | Admitting: Occupational Therapy

## 2022-04-18 ENCOUNTER — Encounter: Payer: Medicare Other | Admitting: Occupational Therapy

## 2022-04-25 ENCOUNTER — Encounter: Payer: Medicare Other | Admitting: Occupational Therapy

## 2022-05-02 ENCOUNTER — Encounter: Payer: Medicare Other | Admitting: Occupational Therapy

## 2022-05-02 ENCOUNTER — Ambulatory Visit: Payer: Medicare Other | Admitting: Physical Therapy

## 2022-05-07 ENCOUNTER — Encounter: Payer: Medicare Other | Admitting: Occupational Therapy

## 2022-05-07 ENCOUNTER — Ambulatory Visit: Payer: Medicare Other | Admitting: Physical Therapy

## 2022-05-09 ENCOUNTER — Ambulatory Visit: Payer: Medicare Other | Admitting: Physical Therapy

## 2022-05-09 ENCOUNTER — Encounter: Payer: Medicare Other | Admitting: Occupational Therapy

## 2022-05-14 ENCOUNTER — Ambulatory Visit: Payer: Medicare Other | Admitting: Physical Therapy

## 2022-05-16 ENCOUNTER — Ambulatory Visit: Payer: Medicare Other | Admitting: Physical Therapy

## 2022-05-21 ENCOUNTER — Ambulatory Visit: Payer: Medicare Other | Admitting: Physical Therapy

## 2022-05-23 ENCOUNTER — Ambulatory Visit: Payer: Medicare Other | Admitting: Physical Therapy

## 2022-05-28 ENCOUNTER — Ambulatory Visit: Payer: Medicare Other | Admitting: Physical Therapy

## 2022-05-30 ENCOUNTER — Ambulatory Visit: Payer: Medicare Other | Admitting: Physical Therapy

## 2022-06-04 ENCOUNTER — Ambulatory Visit: Payer: Medicare Other | Admitting: Physical Therapy

## 2022-06-06 ENCOUNTER — Ambulatory Visit: Payer: Medicare Other | Admitting: Physical Therapy

## 2022-06-11 ENCOUNTER — Ambulatory Visit: Payer: Medicare Other | Admitting: Physical Therapy

## 2022-06-13 ENCOUNTER — Ambulatory Visit: Payer: Medicare Other | Admitting: Physical Therapy

## 2022-06-18 ENCOUNTER — Ambulatory Visit: Payer: Medicare Other | Admitting: Physical Therapy

## 2022-06-20 ENCOUNTER — Ambulatory Visit: Payer: Medicare Other | Admitting: Physical Therapy

## 2022-06-25 ENCOUNTER — Ambulatory Visit: Payer: Medicare Other | Admitting: Physical Therapy

## 2022-06-27 ENCOUNTER — Ambulatory Visit: Payer: Medicare Other | Admitting: Physical Therapy

## 2022-07-02 ENCOUNTER — Ambulatory Visit: Payer: Medicare Other | Admitting: Physical Therapy

## 2022-07-04 ENCOUNTER — Ambulatory Visit: Payer: Medicare Other | Admitting: Physical Therapy

## 2022-07-09 ENCOUNTER — Ambulatory Visit: Payer: Medicare Other | Admitting: Physical Therapy

## 2022-07-11 ENCOUNTER — Ambulatory Visit: Payer: Medicare Other | Admitting: Physical Therapy

## 2022-07-16 ENCOUNTER — Ambulatory Visit: Payer: Medicare Other | Admitting: Physical Therapy

## 2022-07-18 ENCOUNTER — Ambulatory Visit: Payer: Medicare Other | Admitting: Physical Therapy

## 2022-08-22 ENCOUNTER — Ambulatory Visit: Payer: Medicare Other | Attending: Physician Assistant

## 2022-08-22 DIAGNOSIS — R262 Difficulty in walking, not elsewhere classified: Secondary | ICD-10-CM

## 2022-08-22 DIAGNOSIS — M6281 Muscle weakness (generalized): Secondary | ICD-10-CM

## 2022-08-22 DIAGNOSIS — M5442 Lumbago with sciatica, left side: Secondary | ICD-10-CM | POA: Diagnosis not present
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
## 2022-08-28 ENCOUNTER — Ambulatory Visit: Payer: Medicare Other

## 2022-08-28 DIAGNOSIS — R262 Difficulty in walking, not elsewhere classified: Secondary | ICD-10-CM

## 2022-08-28 DIAGNOSIS — M6281 Muscle weakness (generalized): Secondary | ICD-10-CM

## 2022-08-28 DIAGNOSIS — M5442 Lumbago with sciatica, left side: Secondary | ICD-10-CM | POA: Diagnosis not present
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
## 2022-09-04 ENCOUNTER — Ambulatory Visit: Payer: Medicare Other | Attending: Physician Assistant

## 2022-09-04 DIAGNOSIS — R1312 Dysphagia, oropharyngeal phase: Secondary | ICD-10-CM | POA: Diagnosis present

## 2022-09-04 DIAGNOSIS — M5442 Lumbago with sciatica, left side: Secondary | ICD-10-CM

## 2022-09-04 DIAGNOSIS — R262 Difficulty in walking, not elsewhere classified: Secondary | ICD-10-CM

## 2022-09-04 DIAGNOSIS — R41841 Cognitive communication deficit: Secondary | ICD-10-CM | POA: Diagnosis present

## 2022-09-04 DIAGNOSIS — R471 Dysarthria and anarthria: Secondary | ICD-10-CM | POA: Insufficient documentation

## 2022-09-04 DIAGNOSIS — M6281 Muscle weakness (generalized): Secondary | ICD-10-CM
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
## 2022-09-05 ENCOUNTER — Other Ambulatory Visit: Payer: Self-pay | Admitting: Physician Assistant

## 2022-09-05 ENCOUNTER — Ambulatory Visit: Payer: Medicare Other | Admitting: Speech Pathology

## 2022-09-05 DIAGNOSIS — R1312 Dysphagia, oropharyngeal phase: Secondary | ICD-10-CM

## 2022-09-05 DIAGNOSIS — R471 Dysarthria and anarthria: Secondary | ICD-10-CM

## 2022-09-05 DIAGNOSIS — M5416 Radiculopathy, lumbar region: Secondary | ICD-10-CM

## 2022-09-05 DIAGNOSIS — R41841 Cognitive communication deficit: Secondary | ICD-10-CM

## 2022-09-05 DIAGNOSIS — M5442 Lumbago with sciatica, left side: Secondary | ICD-10-CM | POA: Diagnosis not present
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
## 2022-09-06 ENCOUNTER — Ambulatory Visit: Payer: Medicare Other

## 2022-09-06 DIAGNOSIS — M5442 Lumbago with sciatica, left side: Secondary | ICD-10-CM

## 2022-09-06 DIAGNOSIS — M6281 Muscle weakness (generalized): Secondary | ICD-10-CM

## 2022-09-06 DIAGNOSIS — R262 Difficulty in walking, not elsewhere classified: Secondary | ICD-10-CM
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
## 2022-09-11 ENCOUNTER — Ambulatory Visit: Payer: Medicare Other

## 2022-09-11 DIAGNOSIS — R262 Difficulty in walking, not elsewhere classified: Secondary | ICD-10-CM

## 2022-09-11 DIAGNOSIS — M5442 Lumbago with sciatica, left side: Secondary | ICD-10-CM

## 2022-09-11 DIAGNOSIS — M6281 Muscle weakness (generalized): Secondary | ICD-10-CM
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
## 2022-09-12 ENCOUNTER — Encounter: Payer: Medicare Other | Admitting: Speech Pathology

## 2022-09-13 ENCOUNTER — Ambulatory Visit: Payer: Medicare Other | Admitting: Speech Pathology

## 2022-09-13 ENCOUNTER — Ambulatory Visit: Payer: Medicare Other

## 2022-09-13 DIAGNOSIS — M5442 Lumbago with sciatica, left side: Secondary | ICD-10-CM | POA: Diagnosis not present

## 2022-09-13 DIAGNOSIS — R262 Difficulty in walking, not elsewhere classified: Secondary | ICD-10-CM

## 2022-09-13 DIAGNOSIS — R471 Dysarthria and anarthria: Secondary | ICD-10-CM

## 2022-09-13 DIAGNOSIS — R41841 Cognitive communication deficit: Secondary | ICD-10-CM

## 2022-09-13 DIAGNOSIS — M6281 Muscle weakness (generalized): Secondary | ICD-10-CM

## 2022-09-13 DIAGNOSIS — R1312 Dysphagia, oropharyngeal phase: Secondary | ICD-10-CM
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
## 2022-09-18 ENCOUNTER — Ambulatory Visit: Payer: Medicare Other

## 2022-09-18 ENCOUNTER — Ambulatory Visit: Payer: Medicare Other | Admitting: Speech Pathology

## 2022-09-18 DIAGNOSIS — M5442 Lumbago with sciatica, left side: Secondary | ICD-10-CM | POA: Diagnosis not present

## 2022-09-18 DIAGNOSIS — R471 Dysarthria and anarthria: Secondary | ICD-10-CM

## 2022-09-18 DIAGNOSIS — R262 Difficulty in walking, not elsewhere classified: Secondary | ICD-10-CM

## 2022-09-18 DIAGNOSIS — M6281 Muscle weakness (generalized): Secondary | ICD-10-CM

## 2022-09-18 DIAGNOSIS — R41841 Cognitive communication deficit: Secondary | ICD-10-CM
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
## 2022-09-19 ENCOUNTER — Other Ambulatory Visit: Payer: Medicare Other

## 2022-09-20 ENCOUNTER — Ambulatory Visit: Payer: Medicare Other | Admitting: Speech Pathology

## 2022-09-20 ENCOUNTER — Ambulatory Visit: Payer: Medicare Other

## 2022-09-20 DIAGNOSIS — M5442 Lumbago with sciatica, left side: Secondary | ICD-10-CM

## 2022-09-20 DIAGNOSIS — R41841 Cognitive communication deficit: Secondary | ICD-10-CM

## 2022-09-20 DIAGNOSIS — R471 Dysarthria and anarthria: Secondary | ICD-10-CM

## 2022-09-20 DIAGNOSIS — M6281 Muscle weakness (generalized): Secondary | ICD-10-CM
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
## 2022-09-25 ENCOUNTER — Ambulatory Visit: Payer: Medicare Other

## 2022-09-25 ENCOUNTER — Ambulatory Visit: Payer: Medicare Other | Admitting: Speech Pathology

## 2022-09-25 DIAGNOSIS — R262 Difficulty in walking, not elsewhere classified: Secondary | ICD-10-CM

## 2022-09-25 DIAGNOSIS — M6281 Muscle weakness (generalized): Secondary | ICD-10-CM

## 2022-09-25 DIAGNOSIS — R41841 Cognitive communication deficit: Secondary | ICD-10-CM

## 2022-09-25 DIAGNOSIS — R471 Dysarthria and anarthria: Secondary | ICD-10-CM

## 2022-09-25 DIAGNOSIS — M5442 Lumbago with sciatica, left side: Secondary | ICD-10-CM | POA: Diagnosis not present
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
## 2022-09-26 ENCOUNTER — Encounter: Payer: Medicare Other | Admitting: Speech Pathology

## 2022-09-27 ENCOUNTER — Ambulatory Visit: Payer: Medicare Other | Admitting: Speech Pathology

## 2022-09-27 ENCOUNTER — Ambulatory Visit: Payer: Medicare Other | Admitting: Physical Therapy

## 2022-09-27 DIAGNOSIS — R262 Difficulty in walking, not elsewhere classified: Secondary | ICD-10-CM

## 2022-09-27 DIAGNOSIS — M5442 Lumbago with sciatica, left side: Secondary | ICD-10-CM

## 2022-09-27 DIAGNOSIS — R471 Dysarthria and anarthria: Secondary | ICD-10-CM

## 2022-09-27 DIAGNOSIS — M6281 Muscle weakness (generalized): Secondary | ICD-10-CM
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
## 2022-09-28 ENCOUNTER — Encounter: Payer: Self-pay | Admitting: Physical Therapy

## 2022-09-29 ENCOUNTER — Ambulatory Visit
Admission: RE | Admit: 2022-09-29 | Discharge: 2022-09-29 | Disposition: A | Discharge status: Home / Self Care | Payer: Medicare Other | Source: Ambulatory Visit | Attending: Physician Assistant | Admitting: Physician Assistant

## 2022-09-29 DIAGNOSIS — M5416 Radiculopathy, lumbar region: Secondary | ICD-10-CM
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
## 2022-10-02 ENCOUNTER — Ambulatory Visit: Payer: Medicare Other

## 2022-10-02 ENCOUNTER — Ambulatory Visit: Payer: Medicare Other | Attending: Physician Assistant | Admitting: Speech Pathology

## 2022-10-02 DIAGNOSIS — M5442 Lumbago with sciatica, left side: Secondary | ICD-10-CM | POA: Insufficient documentation

## 2022-10-02 DIAGNOSIS — R262 Difficulty in walking, not elsewhere classified: Secondary | ICD-10-CM | POA: Diagnosis present

## 2022-10-02 DIAGNOSIS — R471 Dysarthria and anarthria: Secondary | ICD-10-CM | POA: Insufficient documentation

## 2022-10-02 DIAGNOSIS — M6281 Muscle weakness (generalized): Secondary | ICD-10-CM | POA: Diagnosis present

## 2022-10-02 DIAGNOSIS — R41841 Cognitive communication deficit: Secondary | ICD-10-CM | POA: Diagnosis present
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
## 2022-10-04 ENCOUNTER — Ambulatory Visit: Payer: Medicare Other | Admitting: Speech Pathology

## 2022-10-04 ENCOUNTER — Ambulatory Visit: Payer: Medicare Other

## 2022-10-09 ENCOUNTER — Ambulatory Visit: Payer: Medicare Other | Admitting: Speech Pathology

## 2022-10-09 ENCOUNTER — Ambulatory Visit: Payer: Medicare Other

## 2022-10-09 DIAGNOSIS — R41841 Cognitive communication deficit: Secondary | ICD-10-CM

## 2022-10-09 DIAGNOSIS — R471 Dysarthria and anarthria: Secondary | ICD-10-CM | POA: Diagnosis not present
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
## 2022-10-11 ENCOUNTER — Ambulatory Visit: Payer: Medicare Other | Admitting: Speech Pathology

## 2022-10-11 DIAGNOSIS — R471 Dysarthria and anarthria: Secondary | ICD-10-CM | POA: Diagnosis not present
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
## 2022-10-16 ENCOUNTER — Ambulatory Visit: Payer: Medicare Other

## 2022-10-16 ENCOUNTER — Ambulatory Visit: Payer: Medicare Other | Admitting: Speech Pathology

## 2022-10-16 DIAGNOSIS — M6281 Muscle weakness (generalized): Secondary | ICD-10-CM

## 2022-10-16 DIAGNOSIS — M5442 Lumbago with sciatica, left side: Secondary | ICD-10-CM

## 2022-10-16 DIAGNOSIS — R471 Dysarthria and anarthria: Secondary | ICD-10-CM | POA: Diagnosis not present

## 2022-10-16 DIAGNOSIS — R262 Difficulty in walking, not elsewhere classified: Secondary | ICD-10-CM
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
## 2022-10-18 ENCOUNTER — Ambulatory Visit: Payer: Medicare Other | Admitting: Speech Pathology

## 2022-10-18 ENCOUNTER — Ambulatory Visit: Payer: Medicare Other

## 2022-10-23 ENCOUNTER — Ambulatory Visit: Payer: Medicare Other | Admitting: Speech Pathology

## 2022-10-23 ENCOUNTER — Ambulatory Visit: Payer: Medicare Other

## 2022-10-25 ENCOUNTER — Ambulatory Visit: Payer: Medicare Other | Admitting: Speech Pathology

## 2022-10-25 ENCOUNTER — Ambulatory Visit: Payer: Medicare Other | Admitting: Physical Therapy

## 2022-10-29 ENCOUNTER — Ambulatory Visit: Payer: Medicare Other | Admitting: Physical Therapy

## 2022-10-29 ENCOUNTER — Ambulatory Visit: Payer: Medicare Other | Admitting: Speech Pathology

## 2022-10-31 ENCOUNTER — Ambulatory Visit: Payer: Medicare Other

## 2022-10-31 ENCOUNTER — Ambulatory Visit: Payer: Medicare Other | Admitting: Speech Pathology

## 2022-11-05 ENCOUNTER — Ambulatory Visit: Payer: Medicare Other | Admitting: Physical Therapy

## 2022-11-05 ENCOUNTER — Encounter: Payer: Medicare Other | Admitting: Speech Pathology

## 2022-11-08 ENCOUNTER — Encounter: Payer: Medicare Other | Admitting: Speech Pathology

## 2022-11-12 ENCOUNTER — Ambulatory Visit: Payer: Medicare Other | Admitting: Physical Therapy

## 2022-11-12 ENCOUNTER — Encounter: Payer: Medicare Other | Admitting: Speech Pathology

## 2022-11-14 ENCOUNTER — Encounter: Payer: Medicare Other | Admitting: Speech Pathology

## 2022-11-15 ENCOUNTER — Ambulatory Visit: Payer: Medicare Other | Admitting: Physical Therapy

## 2022-11-19 ENCOUNTER — Encounter: Payer: Medicare Other | Admitting: Speech Pathology

## 2022-11-21 ENCOUNTER — Ambulatory Visit: Payer: Medicare Other

## 2022-11-22 ENCOUNTER — Ambulatory Visit: Payer: Medicare Other

## 2022-11-22 ENCOUNTER — Encounter: Payer: Medicare Other | Admitting: Speech Pathology

## 2022-11-26 ENCOUNTER — Ambulatory Visit: Payer: Medicare Other

## 2022-11-26 ENCOUNTER — Encounter: Payer: Medicare Other | Admitting: Speech Pathology

## 2022-11-28 ENCOUNTER — Ambulatory Visit: Payer: Medicare Other

## 2022-11-28 ENCOUNTER — Encounter: Payer: Medicare Other | Admitting: Speech Pathology

## 2022-11-30 ENCOUNTER — Ambulatory Visit: Payer: Medicare Other | Admitting: Physical Therapy

## 2022-11-30 ENCOUNTER — Encounter: Payer: Medicare Other | Admitting: Speech Pathology

## 2022-12-04 ENCOUNTER — Encounter: Payer: Medicare Other | Admitting: Speech Pathology

## 2022-12-05 ENCOUNTER — Encounter: Payer: Medicare Other | Admitting: Physical Therapy

## 2022-12-06 ENCOUNTER — Encounter: Payer: Medicare Other | Admitting: Speech Pathology

## 2022-12-11 ENCOUNTER — Encounter: Payer: Medicare Other | Admitting: Speech Pathology

## 2022-12-20 ENCOUNTER — Encounter: Payer: Medicare Other | Admitting: Physical Therapy

## 2023-01-03 ENCOUNTER — Encounter: Payer: Medicare Other | Admitting: Physical Therapy

## 2023-01-07 ENCOUNTER — Encounter: Payer: Medicare Other | Admitting: Physical Therapy

## 2023-01-09 ENCOUNTER — Encounter: Payer: Medicare Other | Admitting: Physical Therapy

## 2023-01-17 ENCOUNTER — Encounter: Payer: Medicare Other | Admitting: Physical Therapy

## 2023-01-22 ENCOUNTER — Ambulatory Visit: Payer: Medicare Other

## 2023-01-24 ENCOUNTER — Ambulatory Visit: Payer: Medicare Other | Admitting: Physical Therapy

## 2023-01-29 ENCOUNTER — Ambulatory Visit: Payer: Medicare Other

## 2023-01-31 ENCOUNTER — Ambulatory Visit: Payer: Medicare Other | Admitting: Physical Therapy

## 2023-02-05 ENCOUNTER — Ambulatory Visit: Payer: Medicare Other

## 2023-02-07 ENCOUNTER — Ambulatory Visit: Payer: Medicare Other | Admitting: Physical Therapy

## 2023-02-12 ENCOUNTER — Ambulatory Visit: Payer: Medicare Other

## 2023-02-14 ENCOUNTER — Ambulatory Visit: Payer: Medicare Other | Admitting: Physical Therapy

## 2023-02-19 ENCOUNTER — Ambulatory Visit: Payer: Medicare Other
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
## 2023-06-18 ENCOUNTER — Encounter: Payer: Self-pay | Admitting: Physical Therapy

## 2023-06-18 ENCOUNTER — Ambulatory Visit: Payer: Medicare Other | Attending: Student | Admitting: Physical Therapy

## 2023-06-18 ENCOUNTER — Other Ambulatory Visit: Payer: Self-pay

## 2023-06-18 DIAGNOSIS — R2681 Unsteadiness on feet: Secondary | ICD-10-CM | POA: Insufficient documentation

## 2023-06-18 DIAGNOSIS — R29898 Other symptoms and signs involving the musculoskeletal system: Secondary | ICD-10-CM | POA: Diagnosis present

## 2023-06-18 DIAGNOSIS — R262 Difficulty in walking, not elsewhere classified: Secondary | ICD-10-CM | POA: Diagnosis present

## 2023-06-18 DIAGNOSIS — M6281 Muscle weakness (generalized): Secondary | ICD-10-CM | POA: Insufficient documentation

## 2023-06-19 ENCOUNTER — Ambulatory Visit: Payer: No Typology Code available for payment source | Admitting: Physical Therapy

## 2023-06-24 ENCOUNTER — Ambulatory Visit: Payer: Medicare Other | Admitting: Physical Therapy

## 2023-06-24 DIAGNOSIS — R262 Difficulty in walking, not elsewhere classified: Secondary | ICD-10-CM

## 2023-06-24 DIAGNOSIS — M6281 Muscle weakness (generalized): Secondary | ICD-10-CM | POA: Diagnosis not present

## 2023-06-24 DIAGNOSIS — R2681 Unsteadiness on feet: Secondary | ICD-10-CM
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
## 2023-06-26 ENCOUNTER — Ambulatory Visit: Payer: Medicare Other | Admitting: Physical Therapy

## 2023-06-26 DIAGNOSIS — R2681 Unsteadiness on feet: Secondary | ICD-10-CM

## 2023-06-26 DIAGNOSIS — R29898 Other symptoms and signs involving the musculoskeletal system: Secondary | ICD-10-CM

## 2023-06-26 DIAGNOSIS — M6281 Muscle weakness (generalized): Secondary | ICD-10-CM

## 2023-06-26 DIAGNOSIS — R262 Difficulty in walking, not elsewhere classified: Secondary | ICD-10-CM
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
## 2023-07-01 ENCOUNTER — Telehealth: Payer: Self-pay | Admitting: Physical Therapy

## 2023-07-01 ENCOUNTER — Ambulatory Visit: Payer: Medicare Other | Attending: Student | Admitting: Physical Therapy

## 2023-07-01 DIAGNOSIS — R29898 Other symptoms and signs involving the musculoskeletal system: Secondary | ICD-10-CM | POA: Insufficient documentation

## 2023-07-01 DIAGNOSIS — R262 Difficulty in walking, not elsewhere classified: Secondary | ICD-10-CM | POA: Insufficient documentation

## 2023-07-01 DIAGNOSIS — R2681 Unsteadiness on feet: Secondary | ICD-10-CM | POA: Diagnosis present

## 2023-07-01 DIAGNOSIS — M6281 Muscle weakness (generalized): Secondary | ICD-10-CM | POA: Insufficient documentation

## 2023-07-01 DIAGNOSIS — R42 Dizziness and giddiness: Secondary | ICD-10-CM | POA: Insufficient documentation

## 2023-07-01 NOTE — Telephone Encounter (Signed)
 Pt's wife called front desk with concern because pt was continuing to be dizzy at home and started having emesis following therapy session. Pt laid down to rest and was still not feeling well when awakening upon therapist return of pt's wife call (~18min later). Pt's wife reports he has a long hx of dizziness with positional movements causing him to get an upset stomach and then emesis. Pt's wife says pt will not allow her to call 911. Pt's wife reports pt cannot lie completely flat or he will have dizziness also. Pt's wife reports she also called pt's physician to follow-up regarding this matter (is awaiting return phone call). Pt's wife denies pt having any dysarthria, facial asymmetry, incoordination with movements, nor change in his strength. Pt's wife reports she was primarily calling to make therapist aware of pt's response to therapy treatment today because he has had extensive workup for these symptoms in the past with no significant findings. Therapist will modify future interventions keeping these symptoms in mind as pt likely had response to positioning of exercises today.  Casimiro Needle, PT, DPT, NCS, CSRS Physical Therapist -   [facility]  4:03 PM 07/01/23
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
## 2023-07-03 ENCOUNTER — Ambulatory Visit: Payer: Medicare Other | Admitting: Physical Therapy

## 2023-07-08 ENCOUNTER — Ambulatory Visit: Payer: Medicare Other | Admitting: Physical Therapy

## 2023-07-08 DIAGNOSIS — R29898 Other symptoms and signs involving the musculoskeletal system: Secondary | ICD-10-CM

## 2023-07-08 DIAGNOSIS — R2681 Unsteadiness on feet: Secondary | ICD-10-CM

## 2023-07-08 DIAGNOSIS — M6281 Muscle weakness (generalized): Secondary | ICD-10-CM

## 2023-07-08 DIAGNOSIS — R262 Difficulty in walking, not elsewhere classified: Secondary | ICD-10-CM
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
## 2023-07-10 ENCOUNTER — Ambulatory Visit: Payer: Medicare Other | Admitting: Physical Therapy

## 2023-07-10 DIAGNOSIS — R262 Difficulty in walking, not elsewhere classified: Secondary | ICD-10-CM

## 2023-07-10 DIAGNOSIS — R29898 Other symptoms and signs involving the musculoskeletal system: Secondary | ICD-10-CM

## 2023-07-10 DIAGNOSIS — M6281 Muscle weakness (generalized): Secondary | ICD-10-CM | POA: Diagnosis not present

## 2023-07-10 DIAGNOSIS — R2681 Unsteadiness on feet: Secondary | ICD-10-CM

## 2023-07-15 ENCOUNTER — Ambulatory Visit: Payer: Medicare Other | Admitting: Physical Therapy

## 2023-07-15 DIAGNOSIS — R262 Difficulty in walking, not elsewhere classified: Secondary | ICD-10-CM

## 2023-07-15 DIAGNOSIS — M6281 Muscle weakness (generalized): Secondary | ICD-10-CM

## 2023-07-15 DIAGNOSIS — R29898 Other symptoms and signs involving the musculoskeletal system: Secondary | ICD-10-CM

## 2023-07-15 DIAGNOSIS — R2681 Unsteadiness on feet: Secondary | ICD-10-CM
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
## 2023-07-17 ENCOUNTER — Ambulatory Visit: Payer: Medicare Other | Admitting: Physical Therapy

## 2023-07-17 DIAGNOSIS — R29898 Other symptoms and signs involving the musculoskeletal system: Secondary | ICD-10-CM

## 2023-07-17 DIAGNOSIS — R2681 Unsteadiness on feet: Secondary | ICD-10-CM

## 2023-07-17 DIAGNOSIS — M6281 Muscle weakness (generalized): Secondary | ICD-10-CM | POA: Diagnosis not present

## 2023-07-17 DIAGNOSIS — R262 Difficulty in walking, not elsewhere classified: Secondary | ICD-10-CM

## 2023-07-17 DIAGNOSIS — R42 Dizziness and giddiness: Secondary | ICD-10-CM
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
## 2023-07-22 ENCOUNTER — Ambulatory Visit: Payer: Medicare Other | Admitting: Physical Therapy

## 2023-07-22 DIAGNOSIS — R2681 Unsteadiness on feet: Secondary | ICD-10-CM

## 2023-07-22 DIAGNOSIS — R42 Dizziness and giddiness: Secondary | ICD-10-CM

## 2023-07-22 DIAGNOSIS — R262 Difficulty in walking, not elsewhere classified: Secondary | ICD-10-CM

## 2023-07-22 DIAGNOSIS — R29898 Other symptoms and signs involving the musculoskeletal system: Secondary | ICD-10-CM

## 2023-07-22 DIAGNOSIS — M6281 Muscle weakness (generalized): Secondary | ICD-10-CM | POA: Diagnosis not present

## 2023-07-22 NOTE — Therapy (Signed)
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 discussions of making laundry station more accessible at home (currently in basement)     Ginny Forth, PT, DPT, NCS, CSRS Physical Therapist - [facility]  [facility]  12:36 PM 07/22/23

## 2023-07-24 ENCOUNTER — Ambulatory Visit: Payer: Medicare Other | Admitting: Physical Therapy

## 2023-07-24 DIAGNOSIS — R262 Difficulty in walking, not elsewhere classified: Secondary | ICD-10-CM

## 2023-07-24 DIAGNOSIS — R2681 Unsteadiness on feet: Secondary | ICD-10-CM

## 2023-07-24 DIAGNOSIS — M6281 Muscle weakness (generalized): Secondary | ICD-10-CM | POA: Diagnosis not present

## 2023-07-24 DIAGNOSIS — R29898 Other symptoms and signs involving the musculoskeletal system: Secondary | ICD-10-CM

## 2023-07-24 DIAGNOSIS — R42 Dizziness and giddiness: Secondary | ICD-10-CM

## 2023-07-24 NOTE — Therapy (Signed)
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 despite this, pt demonstrates improvements on Berg Balance test, TUG, and . Pt improving in these areas; however, continues to remain in a high fall risk cut-off score, except for his walking speed. Pt continues to have significant postural deviations with thoracic kyphosis, forward head, and excessive posterior pelvic tilt impacting his functional mobility and balance. Pt has had significant dizziness impacting his participation in therapy sessions and therefore VOR cancellation interventions have been initiated. Mr. Arriaga will benefit from specific interventions targeting his strength, balance, dizziness, and posture. He will benefit from continued skilled PT to improve these deficits in order to increase QOL and ease/safety with ADLs. Patient's condition has the potential to  improve in response to therapy. Maximum improvement is yet to be obtained. The anticipated improvement is attainable and reasonable in a generally predictable time.     OBJECTIVE IMPAIRMENTS: Abnormal gait, decreased activity tolerance, decreased balance, decreased endurance, decreased mobility, difficulty walking, decreased strength, hypomobility, impaired flexibility, improper body mechanics, and postural dysfunction.   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   REHAB POTENTIAL: Good  CLINICAL DECISION MAKING: Evolving/moderate complexity  EVALUATION COMPLEXITY: Moderate  PLAN:  PT FREQUENCY: 1-2x/week  PT DURATION: 12 weeks  PLANNED INTERVENTIONS: 97164- PT Re-evaluation, 97110-Therapeutic exercises, 97530- Therapeutic activity, 97112- Neuromuscular re-education, 97535- Self Care, 40981- Manual therapy, [phone]- Gait training, [phone]- Orthotic Fit/training, [phone]- Canalith repositioning, Patient/Family education, Balance training, Stair training, Taping, Joint manipulation, Spinal mobilization, Vestibular training, DME instructions, Cryotherapy, and Moist heat  PLAN FOR NEXT SESSION:  Unable to do on progress note:  - 5xSTS - full walk test - FGA  *follow-up on VOR cancellation exercise Posture:   - seated and standing stretches   - peri-scapular strengthening Balance:   - consideration of head movements during balance interventions to avoid significant  increase in dizziness  - narrow BOS/tandem - eyes closed - single limb stance Dynamic gait: - turning (monitor dizziness with this) - backwards  - stepping over obstacles - stair training - with progression to carrying items on  steps and decreasing reliance on B UE support   - continue discussions of making laundry station more accessible at home (currently in basement)     Ginny Forth, PT, DPT, NCS, CSRS Physical Therapist - [facility]  [facility]  6:50 PM 07/24/23

## 2023-07-26 ENCOUNTER — Other Ambulatory Visit: Payer: Self-pay | Admitting: Physician Assistant

## 2023-07-26 ENCOUNTER — Ambulatory Visit
Admission: RE | Admit: 2023-07-26 | Discharge: 2023-07-26 | Disposition: A | Discharge status: Home / Self Care | Source: Ambulatory Visit | Attending: Physician Assistant | Admitting: Physician Assistant

## 2023-07-26 DIAGNOSIS — S0990XA Unspecified injury of head, initial encounter: Secondary | ICD-10-CM | POA: Insufficient documentation

## 2023-07-26 DIAGNOSIS — Y92009 Unspecified place in unspecified non-institutional (private) residence as the place of occurrence of the external cause: Secondary | ICD-10-CM | POA: Insufficient documentation

## 2023-07-26 DIAGNOSIS — Z043 Encounter for examination and observation following other accident: Secondary | ICD-10-CM | POA: Diagnosis not present

## 2023-07-26 DIAGNOSIS — W19XXXA Unspecified fall, initial encounter: Secondary | ICD-10-CM | POA: Diagnosis not present

## 2023-07-29 ENCOUNTER — Ambulatory Visit: Payer: Medicare Other | Admitting: Physical Therapy

## 2023-07-31 ENCOUNTER — Ambulatory Visit: Payer: Medicare Other | Attending: Student | Admitting: Physical Therapy

## 2023-07-31 ENCOUNTER — Telehealth: Payer: Self-pay | Admitting: Physical Therapy

## 2023-07-31 NOTE — Telephone Encounter (Signed)
 Pt contacted via telephone and Thereasa Parkin spoke with pt's wife [name] who reports pt is receiving follow-up care from pt having a fall last week resulting in UE fx. Discussed cancelling patients appointments next week of April 7th. Wife to call and follow-up if any additional appointments need to be canceled.  Casimiro Needle, PT, DPT, NCS, CSRS

## 2023-08-05 ENCOUNTER — Ambulatory Visit: Payer: Medicare Other | Admitting: Physical Therapy

## 2023-08-07 ENCOUNTER — Ambulatory Visit: Payer: Medicare Other | Admitting: Physical Therapy

## 2023-08-12 ENCOUNTER — Ambulatory Visit: Payer: Medicare Other | Admitting: Physical Therapy

## 2023-08-14 ENCOUNTER — Ambulatory Visit: Payer: Medicare Other | Admitting: Physical Therapy

## 2023-08-19 ENCOUNTER — Ambulatory Visit: Payer: Medicare Other | Admitting: Physical Therapy

## 2023-08-21 ENCOUNTER — Ambulatory Visit: Payer: Medicare Other | Admitting: Physical Therapy

## 2023-08-26 ENCOUNTER — Ambulatory Visit: Payer: Medicare Other | Admitting: Physical Therapy

## 2023-08-28 ENCOUNTER — Ambulatory Visit: Payer: Medicare Other | Admitting: Physical Therapy

## 2023-09-02 ENCOUNTER — Ambulatory Visit: Payer: Medicare Other | Admitting: Physical Therapy

## 2023-09-04 ENCOUNTER — Ambulatory Visit: Payer: Medicare Other | Admitting: Physical Therapy

## 2023-09-07 ENCOUNTER — Encounter: Payer: Self-pay | Admitting: Emergency Medicine

## 2023-09-07 ENCOUNTER — Other Ambulatory Visit: Payer: Self-pay

## 2023-09-07 ENCOUNTER — Emergency Department
Admission: EM | Admit: 2023-09-07 | Discharge: 2023-09-07 | Disposition: A | Discharge status: Home / Self Care | Attending: Emergency Medicine | Admitting: Emergency Medicine

## 2023-09-07 ENCOUNTER — Emergency Department

## 2023-09-07 DIAGNOSIS — E039 Hypothyroidism, unspecified: Secondary | ICD-10-CM | POA: Insufficient documentation

## 2023-09-07 DIAGNOSIS — Z8546 Personal history of malignant neoplasm of prostate: Secondary | ICD-10-CM | POA: Insufficient documentation

## 2023-09-07 DIAGNOSIS — Z794 Long term (current) use of insulin: Secondary | ICD-10-CM | POA: Diagnosis not present

## 2023-09-07 DIAGNOSIS — R079 Chest pain, unspecified: Secondary | ICD-10-CM

## 2023-09-07 DIAGNOSIS — I129 Hypertensive chronic kidney disease with stage 1 through stage 4 chronic kidney disease, or unspecified chronic kidney disease: Secondary | ICD-10-CM | POA: Insufficient documentation

## 2023-09-07 DIAGNOSIS — Z951 Presence of aortocoronary bypass graft: Secondary | ICD-10-CM | POA: Insufficient documentation

## 2023-09-07 DIAGNOSIS — Z79899 Other long term (current) drug therapy: Secondary | ICD-10-CM | POA: Diagnosis not present

## 2023-09-07 DIAGNOSIS — R0789 Other chest pain: Secondary | ICD-10-CM | POA: Insufficient documentation

## 2023-09-07 DIAGNOSIS — R42 Dizziness and giddiness: Secondary | ICD-10-CM | POA: Insufficient documentation

## 2023-09-07 DIAGNOSIS — E1122 Type 2 diabetes mellitus with diabetic chronic kidney disease: Secondary | ICD-10-CM | POA: Diagnosis not present

## 2023-09-07 DIAGNOSIS — Z7901 Long term (current) use of anticoagulants: Secondary | ICD-10-CM | POA: Insufficient documentation

## 2023-09-07 DIAGNOSIS — N189 Chronic kidney disease, unspecified: Secondary | ICD-10-CM | POA: Insufficient documentation

## 2023-09-07 DIAGNOSIS — I2510 Atherosclerotic heart disease of native coronary artery without angina pectoris: Secondary | ICD-10-CM | POA: Insufficient documentation

## 2023-09-07 DIAGNOSIS — R0602 Shortness of breath: Secondary | ICD-10-CM | POA: Diagnosis not present

## 2023-09-07 LAB — CBC WITH DIFFERENTIAL/PLATELET
Abs Immature Granulocytes: 0.01 10*3/uL (ref 0.00–0.07)
Basophils Absolute: 0.1 10*3/uL (ref 0.0–0.1)
Basophils Relative: 1 %
Eosinophils Absolute: 0.2 10*3/uL (ref 0.0–0.5)
Eosinophils Relative: 2 %
HCT: 37 % — ABNORMAL LOW (ref 39.0–52.0)
Hemoglobin: 12.5 g/dL — ABNORMAL LOW (ref 13.0–17.0)
Immature Granulocytes: 0 %
Lymphocytes Relative: 20 %
Lymphs Abs: 2 10*3/uL (ref 0.7–4.0)
MCH: 30.9 pg (ref 26.0–34.0)
MCHC: 33.8 g/dL (ref 30.0–36.0)
MCV: 91.4 fL (ref 80.0–100.0)
Monocytes Absolute: 0.8 10*3/uL (ref 0.1–1.0)
Monocytes Relative: 9 %
Neutro Abs: 6.6 10*3/uL (ref 1.7–7.7)
Neutrophils Relative %: 68 %
Platelets: 247 10*3/uL (ref 150–400)
RBC: 4.05 MIL/uL — ABNORMAL LOW (ref 4.22–5.81)
RDW: 13.4 % (ref 11.5–15.5)
WBC: 9.6 10*3/uL (ref 4.0–10.5)
nRBC: 0 % (ref 0.0–0.2)

## 2023-09-07 LAB — COMPREHENSIVE METABOLIC PANEL WITH GFR
ALT: <5 U/L (ref 0–44)
AST: 25 U/L (ref 15–41)
Albumin: 4 g/dL (ref 3.5–5.0)
Alkaline Phosphatase: 45 U/L (ref 38–126)
Anion gap: 10 (ref 5–15)
BUN: 42 mg/dL — ABNORMAL HIGH (ref 8–23)
CO2: 21 mmol/L — ABNORMAL LOW (ref 22–32)
Calcium: 8.9 mg/dL (ref 8.9–10.3)
Chloride: 108 mmol/L (ref 98–111)
Creatinine, Ser: 3.32 mg/dL — ABNORMAL HIGH (ref 0.61–1.24)
GFR, Estimated: 18 mL/min — ABNORMAL LOW (ref >60)
Glucose, Bld: 120 mg/dL — ABNORMAL HIGH (ref 70–99)
Potassium: 4.1 mmol/L (ref 3.5–5.1)
Sodium: 139 mmol/L (ref 135–145)
Total Bilirubin: 1.1 mg/dL (ref 0.0–1.2)
Total Protein: 7 g/dL (ref 6.5–8.1)

## 2023-09-07 LAB — TROPONIN I (HIGH SENSITIVITY)
Troponin I (High Sensitivity): 20 ng/L — ABNORMAL HIGH (ref <18)
Troponin I (High Sensitivity): 19 ng/L — ABNORMAL HIGH (ref <18)

## 2023-09-07 LAB — LIPASE, BLOOD: Lipase: 37 U/L (ref 11–51)

## 2023-09-07 LAB — D-DIMER, QUANTITATIVE: D-Dimer, Quant: 0.39 ug{FEU}/mL (ref 0.00–0.50)

## 2023-09-07 MED ORDER — ACETAMINOPHEN 500 MG PO TABS
1000.0000 mg | ORAL_TABLET | Freq: Once | ORAL | Status: AC
  Administered 2023-09-07: 1000 mg via ORAL
  Filled 2023-09-07: qty 2

## 2023-09-07 NOTE — ED Notes (Signed)
 Pt was brought to room #7. Wheelchair was offered and pt refused. EKG/VS done in room. Blood work still needed. RN assigned to this room notified

## 2023-09-07 NOTE — Discharge Instructions (Signed)
You may take Tylenol 1000 mg every 6 hours as needed for pain. °

## 2023-09-07 NOTE — ED Provider Notes (Addendum)
 [facility] Provider Note    Event Date/Time   First MD Initiated Contact with Patient 09/07/23 [phone]     (approximate)   History   Chest Pain   HPI  Victor Gould is a 83 y.o. male with history of atrial fibrillation, CAD s/p CABG, hypertension, hyperlipidemia, hypothyroidism, diabetes, CKD who presents to the emergency department with his wife For concerns for right-sided chest pain.  Initially told triage staff that it felt sharp in nature but states now that it feels like a pressure.  Does radiate into the back.  He is not aware of any aggravating or alleviating factors.  No fevers or cough.  Has had shortness of breath.  No nausea, vomiting, diaphoresis.  Has chronic dizziness.  No calf tenderness or calf swelling.  He is on Plavix  and Eliquis and reports compliance.  Denies prior history of PE or DVT.  Did recently break his left arm though and was in a cast for a period of time.  Wife is concerned that this could be musculoskeletal in nature as she states that he has to manually open and close their garage door and now is unable to use his left arm to do so.  He denies any known injury otherwise or increased physical exertion from baseline.  Patient states he is not sure if this feels like his prior anginal equivalent or not.   History provided by patient, wife.    Past Medical History:  Diagnosis Date   ASCVD (arteriosclerotic cardiovascular disease)    Cancer (HCC)    prostate   Chronic kidney disease    Colon polyps    Constipation    Coronary artery disease    Diabetes mellitus    Family history of adverse reaction to anesthesia    children - PONV   GERD (gastroesophageal reflux disease)    Hemorrhoids    Hyperlipidemia    Hypertension    PCP Dr Firman Hughes at [facility]   Hypothyroidism    MI (myocardial infarction) (HCC)    PONV (postoperative nausea and vomiting)    Thyroid disease    Vertigo    positional    Past Surgical  History:  Procedure Laterality Date   APPENDECTOMY     CATARACT EXTRACTION W/PHACO Right 10/27/2018   Procedure: CATARACT EXTRACTION PHACO AND INTRAOCULAR LENS PLACEMENT (IOC) RIGHT, DIABETIC;  Surgeon: Clair Crews, MD;  Location: [facility];  Service: Ophthalmology;  Laterality: Right;  diabetic - insulin    CATARACT EXTRACTION W/PHACO Left 11/25/2018   Procedure: CATARACT EXTRACTION PHACO AND INTRAOCULAR LENS PLACEMENT (IOC) LEFT DIABETIC;  Surgeon: Clair Crews, MD;  Location: [facility];  Service: Ophthalmology;  Laterality: Left;   COLONOSCOPY     colonoscopy with polypectomy     COLONOSCOPY WITH PROPOFOL  N/A 12/02/2017   Procedure: COLONOSCOPY WITH PROPOFOL ;  Surgeon: Cassie Click, MD;  Location: [facility];  Service: Endoscopy;  Laterality: N/A;   CORONARY ARTERY BYPASS GRAFT N/A 08/28/2016   Procedure: CORONARY ARTERY BYPASS GRAFTING (CABG) x five, using left internal mammary artery and right leg greater saphenous vein harvested endoscopically - -LIMA to LAD, -SVG to OM, -SVG to DIAGONAL, -SEQ SVG to PDA, PLVB ;  Surgeon: Norita Beauvais, MD;  Location: [facility] OR;  Service: Open Heart Surgery;  Laterality: N/A;   CYSTOSCOPY N/A 09/29/2013   Procedure: CYSTOSCOPY;  Surgeon: Devorah Fonder, MD;  Location: [facility] ORS;  Service: Urology;  Laterality: N/A;   ESOPHAGOGASTRODUODENOSCOPY (EGD) WITH PROPOFOL   N/A 12/02/2017   Procedure: ESOPHAGOGASTRODUODENOSCOPY (EGD) WITH PROPOFOL ;  Surgeon: Cassie Click, MD;  Location: [facility] ENDOSCOPY;  Service: Endoscopy;  Laterality: N/A;   HERNIA REPAIR     inguinal bilaterally   LEFT HEART CATH AND CORONARY ANGIOGRAPHY N/A 08/27/2016   Procedure: Left Heart Cath and Coronary Angiography;  Surgeon: Arty Binning, MD;  Location: [facility] INVASIVE CV LAB;  Service: Cardiovascular;  Laterality: N/A;   ROBOT ASSISTED LAPAROSCOPIC RADICAL PROSTATECTOMY  06/25/2011   Procedure: ROBOTIC ASSISTED LAPAROSCOPIC RADICAL PROSTATECTOMY LEVEL 2;   Surgeon: Kristeen Peto, MD;  Location: [facility] ORS;  Service: Urology;  Laterality: N/A;   TEE WITHOUT CARDIOVERSION N/A 08/28/2016   Procedure: TRANSESOPHAGEAL ECHOCARDIOGRAM (TEE);  Surgeon: Norita Beauvais, MD;  Location: [facility] OR;  Service: Open Heart Surgery;  Laterality: N/A;   UPPER GASTROINTESTINAL ENDOSCOPY     URETHRAL SLING N/A 09/29/2013   Procedure: MALE SLING;  Surgeon: Devorah Fonder, MD;  Location: [facility] ORS;  Service: Urology;  Laterality: N/A;    MEDICATIONS:  Prior to Admission medications   Medication Sig Start Date End Date Taking? Authorizing Provider  acetaminophen  (TYLENOL ) 500 MG tablet Take 500-1,000 mg by mouth daily as needed for mild pain or headache (pain).     [provider]  amLODipine  (NORVASC ) 10 MG tablet Take 5 mg by mouth daily.    [provider]  b complex vitamins tablet Take 1 tablet by mouth daily after breakfast.    [provider]  carbidopa -levodopa  (SINEMET  IR) 25-100 MG tablet Take 1 tablet by mouth 3 (three) times daily. 11/17/21   [provider]  cholecalciferol  (VITAMIN D ) 1000 units tablet Take 1,000 Units by mouth daily after breakfast.    [provider]  clopidogrel  (PLAVIX ) 75 MG tablet Take 75 mg by mouth daily. 10/11/21   [provider]  docusate sodium  (COLACE) 100 MG capsule Take 1 tablet once or twice daily as needed for constipation while taking narcotic pain medicine Patient not taking: Reported on 06/18/2023 11/29/21   Lynnda Sas, MD  ELIQUIS 2.5 MG TABS tablet Take 2.5 mg by mouth 2 (two) times daily. 10/23/21   [provider]  HYDROcodone -acetaminophen  (NORCO/VICODIN) 5-325 MG tablet Take 2 tablets by mouth every 6 (six) hours as needed for moderate pain or severe pain. Patient not taking: Reported on 06/18/2023 11/29/21   Lynnda Sas, MD  hydrocortisone cream 1 % Apply 1 application topically daily as needed for itching.    [provider]  insulin  glargine (LANTUS ) 100  unit/mL SOPN Inject 6-12 Units into the skin See admin instructions. Inject 12 units 10 minutes before breakfast, and 6 units before bed    [provider]  insulin  lispro (HUMALOG) 100 UNIT/ML KiwkPen Inject 7-10 Units into the skin See admin instructions. Inject 7 units subcutaneously before breakfast, lunch and dinner - plus adjustment for CBG 140-180 add 1 unit, 180-220 add 3 units, >220 add 3 units    [provider]  levETIRAcetam  (KEPPRA ) 500 MG tablet Take 1 tablet (500 mg total) by mouth 2 (two) times daily for 5 days. 03/04/21 03/09/21  Meuth, Brooke A, PA-C  levothyroxine  (SYNTHROID , LEVOTHROID) 75 MCG tablet Take 75 mcg by mouth daily before breakfast.    [provider]  losartan  (COZAAR ) 25 MG tablet Take 25 mg by mouth daily. Pt reports taking 1/2 tablet in AM and 1/2 tablet in afternoon 11/15/21   [provider]  Multiple Vitamin (MULTIVITAMIN WITH MINERALS) TABS tablet Take 1  tablet by mouth daily after breakfast.    [provider]  omeprazole (PRILOSEC) 20 MG capsule Take 20 mg by mouth daily before breakfast.     [provider]  Polyethyl Glycol-Propyl Glycol (SYSTANE FREE OP) Place 1 drop into both eyes daily as needed (dry eyes). Preservative-free    [provider]  triamcinolone cream (KENALOG) 0.5 % Apply topically 2 (two) times daily. 11/01/21   [provider]    Physical Exam   Triage Vital Signs: ED Triage Vitals  Encounter Vitals Group     BP 09/07/23 0457 (!) 188/78     Systolic BP Percentile --      Diastolic BP Percentile --      Pulse Rate 09/07/23 0457 76     Resp 09/07/23 0457 16     Temp 09/07/23 0457 97.8 F (36.6 C)     Temp Source 09/07/23 0457 Oral     SpO2 09/07/23 0457 97 %     Weight 09/07/23 0449 172 lb 9.9 oz (78.3 kg)     Height 09/07/23 0449 5\' 8"  (1.727 m)     Head Circumference --      Peak Flow --      Pain Score 09/07/23 0449 6     Pain Loc --      Pain Education --       Exclude from Growth Chart --     Most recent vital signs: Vitals:   09/07/23 0651 09/07/23 0700  BP: (!) 154/68 (!) 160/65  Pulse: 65 64  Resp: 14 19  Temp:    SpO2: 95% 93%    CONSTITUTIONAL: Alert, responds appropriately to questions.  Elderly, pleasant, in no distress HEAD: Normocephalic, atraumatic EYES: Conjunctivae clear, pupils appear equal, sclera nonicteric ENT: normal nose; moist mucous membranes NECK: Supple, normal ROM CARD: RRR; S1 and S2 appreciated, patient does have some tenderness over the right chest wall but no crepitus, deformity, rash RESP: Normal chest excursion without splinting or tachypnea; breath sounds clear and equal bilaterally; no wheezes, no rhonchi, no rales, no hypoxia or respiratory distress, speaking full sentences ABD/GI: Non-distended; soft, non-tender, no rebound, no guarding, no peritoneal signs, no tenderness in the right upper quadrant, negative Murphy sign BACK: The back appears normal, no midline spinal tenderness or step-off or deformity, patient does have some tenderness in the right mid thoracic area to palpation without overlying skin changes EXT: Normal ROM in all joints; no deformity noted, no edema, no calf tenderness or calf swelling, left wrist is in a brace SKIN: Normal color for age and race; warm; no rash on exposed skin NEURO: Moves all extremities equally, normal speech PSYCH: The patient's mood and manner are appropriate.   ED Results / Procedures / Treatments   LABS: (all labs ordered are listed, but only abnormal results are displayed) Labs Reviewed  CBC WITH DIFFERENTIAL/PLATELET - Abnormal; Notable for the following components:      Result Value   RBC 4.05 (*)    Hemoglobin 12.5 (*)    HCT 37.0 (*)    All other components within normal limits  COMPREHENSIVE METABOLIC PANEL WITH GFR - Abnormal; Notable for the following components:   CO2 21 (*)    Glucose, Bld 120 (*)    BUN 42 (*)    Creatinine, Ser 3.32  (*)    GFR, Estimated 18 (*)    All other components within normal limits  TROPONIN I (HIGH SENSITIVITY) - Abnormal; Notable for the following components:  Troponin I (High Sensitivity) 20 (*)    All other components within normal limits  TROPONIN I (HIGH SENSITIVITY) - Abnormal; Notable for the following components:   Troponin I (High Sensitivity) 19 (*)    All other components within normal limits  LIPASE, BLOOD  D-DIMER, QUANTITATIVE     EKG:  EKG Interpretation Date/Time:  Saturday Sep 07 2023 04:54:51 EDT Ventricular Rate:  77 PR Interval:  183 QRS Duration:  101 QT Interval:  400 QTC Calculation: 453 R Axis:   13  Text Interpretation: Sinus rhythm Probable left atrial enlargement Borderline repolarization abnormality Confirmed by Verneda Golder [phone]) on 09/07/2023 4:55:45 AM         RADIOLOGY: My personal review and interpretation of imaging: Chest x-ray clear.  I have personally reviewed all radiology reports.   DG Chest Portable 1 View Result Date: 09/07/2023 CLINICAL DATA:  Right-sided chest pain EXAM: PORTABLE CHEST 1 VIEW COMPARISON:  03/02/2021 FINDINGS: Status post median sternotomy and CABG procedure. Stable cardiomediastinal contours. Moderate hiatal hernia identified. No pleural fluid, interstitial edema, or airspace consolidation. Visualized osseous structures are notable for a remote healed left proximal humerus fracture. IMPRESSION: 1. No acute cardiopulmonary abnormalities. 2. Moderate hiatal hernia. 3. Status post CABG procedure. Electronically Signed   By: Kimberley Penman M.D.   On: 09/07/2023 07:32     PROCEDURES:  Critical Care performed: No     .1-3 Lead EKG Interpretation  Performed by: Rashelle Ireland, Clover Dao, DO Authorized by: Hasina Kreager, Clover Dao, DO     Interpretation: normal     ECG rate:  76   ECG rate assessment: normal     Rhythm: sinus rhythm     Ectopy: none     Conduction: normal       IMPRESSION / MDM / ASSESSMENT AND PLAN / ED  COURSE  I reviewed the triage vital signs and the nursing notes.    Patient here with right-sided chest pain.  Does have significant cardiac history.  EKG is nonischemic.  Could be musculoskeletal in nature.  Wife reports since recently breaking the left arm he has had to use his right arm only to open and close the garage door.  The patient is on the cardiac monitor to evaluate for evidence of arrhythmia and/or significant heart rate changes.   DIFFERENTIAL DIAGNOSIS (includes but not limited to):   ACS, PE, pneumonia, CHF, pneumothorax, chest wall pain, costochondritis, less likely biliary colic, cholecystitis, pancreatitis   Patient's presentation is most consistent with acute presentation with potential threat to life or bodily function.   PLAN: Will obtain cardiac labs, D-dimer, chest x-ray.  Will give Tylenol  for pain.  EKG nonischemic.   MEDICATIONS GIVEN IN ED: Medications  acetaminophen  (TYLENOL ) tablet 1,000 mg (1,000 mg Oral Given 09/07/23 0526)     ED COURSE: 6:47 AM  First troponin negative.  D-dimer negative.  Stable chronic kidney disease based on labs in care everywhere.  Normal LFTs, lipase.  Chest x-ray not yet read by radiology but reviewed and interpreted by myself and shows no acute abnormality.  Second troponin pending.  If negative, anticipate discharge home with rest, alternating heat and ice, Tylenol  as needed.  8:00 AM  Pt's chest x-ray reviewed and interpreted by myself and the radiologist and is unremarkable.  Second troponin negative.  Patient feeling better.  Suspect musculoskeletal chest pain.  Recommended Tylenol  at home.  Will have him follow-up with his PCP, cardiologist as an outpatient if symptoms continue.   At this time,  I do not feel there is any life-threatening condition present. I reviewed all nursing notes, vitals, pertinent previous records.  All lab and urine results, EKGs, imaging ordered have been independently reviewed and interpreted by  myself.  I reviewed all available radiology reports from any imaging ordered this visit.  Based on my assessment, I feel the patient is safe to be discharged home without further emergent workup and can continue workup as an outpatient as needed. Discussed all findings, treatment plan as well as usual and customary return precautions.  They verbalize understanding and are comfortable with this plan.  Outpatient follow-up has been provided as needed.  All questions have been answered.  CONSULTS: none   OUTSIDE RECORDS REVIEWED: Reviewed last PCP note on 08/07/2023.       FINAL CLINICAL IMPRESSION(S) / ED DIAGNOSES   Final diagnoses:  Right-sided chest pain     Rx / DC Orders   ED Discharge Orders     None        Note:  This document was prepared using Dragon voice recognition software and may include unintentional dictation errors.   Lai Hendriks, Clover Dao, DO 09/07/23 1610    Alphonsa Brickle, Clover Dao, DO 09/07/23 [phone]

## 2023-09-07 NOTE — ED Triage Notes (Addendum)
 Pt to ED via POV c/o right sided CP. Started around 3am today. Pain radiates to back. Pt reports taking plavix  and eliquis. Denies SHOB, dizziness, fevers.

## 2023-09-09 ENCOUNTER — Ambulatory Visit: Payer: Medicare Other | Admitting: Physical Therapy

## 2023-09-11 ENCOUNTER — Ambulatory Visit: Payer: Medicare Other | Admitting: Physical Therapy

## 2023-09-16 ENCOUNTER — Ambulatory Visit: Payer: Medicare Other | Admitting: Physical Therapy

## 2023-09-18 ENCOUNTER — Ambulatory Visit: Payer: Medicare Other | Admitting: Physical Therapy

## 2023-09-25 ENCOUNTER — Ambulatory Visit: Payer: Medicare Other | Admitting: Physical Therapy

## 2023-09-30 ENCOUNTER — Ambulatory Visit: Payer: Medicare Other | Admitting: Physical Therapy

## 2023-10-02 ENCOUNTER — Ambulatory Visit: Payer: Medicare Other | Admitting: Physical Therapy

## 2023-10-07 ENCOUNTER — Ambulatory Visit: Payer: Medicare Other | Admitting: Physical Therapy

## 2023-10-09 ENCOUNTER — Ambulatory Visit: Payer: Medicare Other | Admitting: Physical Therapy

## 2023-10-14 ENCOUNTER — Ambulatory Visit: Payer: Medicare Other | Admitting: Physical Therapy

## 2023-10-16 ENCOUNTER — Ambulatory Visit: Payer: Medicare Other | Admitting: Physical Therapy

## 2023-10-21 ENCOUNTER — Ambulatory Visit: Payer: Medicare Other | Admitting: Physical Therapy

## 2023-10-23 ENCOUNTER — Ambulatory Visit: Payer: Medicare Other | Admitting: Physical Therapy

## 2023-10-28 ENCOUNTER — Ambulatory Visit: Payer: Medicare Other | Admitting: Physical Therapy

## 2023-10-30 ENCOUNTER — Ambulatory Visit: Payer: Medicare Other | Admitting: Physical Therapy

## 2023-11-04 ENCOUNTER — Ambulatory Visit: Payer: Medicare Other | Admitting: Physical Therapy

## 2023-11-06 ENCOUNTER — Ambulatory Visit: Payer: Medicare Other | Admitting: Physical Therapy

## 2023-11-11 ENCOUNTER — Ambulatory Visit: Payer: Medicare Other | Admitting: Physical Therapy

## 2023-11-13 ENCOUNTER — Ambulatory Visit: Payer: Medicare Other | Admitting: Physical Therapy

## 2023-11-18 ENCOUNTER — Ambulatory Visit: Payer: Medicare Other | Admitting: Physical Therapy

## 2023-11-20 ENCOUNTER — Ambulatory Visit: Payer: Medicare Other | Admitting: Physical Therapy

## 2023-11-25 ENCOUNTER — Ambulatory Visit: Payer: Medicare Other | Admitting: Physical Therapy

## 2023-11-27 ENCOUNTER — Ambulatory Visit: Payer: Medicare Other | Admitting: Physical Therapy

## 2023-11-28 ENCOUNTER — Emergency Department

## 2023-11-28 ENCOUNTER — Inpatient Hospital Stay
Admission: EM | Admit: 2023-11-28 | Discharge: 2023-12-14 | DRG: 065 | Disposition: A | Discharge status: Skilled Nursing Facility | Attending: Internal Medicine | Admitting: Internal Medicine

## 2023-11-28 ENCOUNTER — Other Ambulatory Visit: Payer: Self-pay

## 2023-11-28 DIAGNOSIS — Z6823 Body mass index (BMI) 23.0-23.9, adult: Secondary | ICD-10-CM

## 2023-11-28 DIAGNOSIS — I5032 Chronic diastolic (congestive) heart failure: Secondary | ICD-10-CM | POA: Diagnosis present

## 2023-11-28 DIAGNOSIS — Z79899 Other long term (current) drug therapy: Secondary | ICD-10-CM

## 2023-11-28 DIAGNOSIS — Z7902 Long term (current) use of antithrombotics/antiplatelets: Secondary | ICD-10-CM

## 2023-11-28 DIAGNOSIS — I639 Cerebral infarction, unspecified: Secondary | ICD-10-CM | POA: Diagnosis present

## 2023-11-28 DIAGNOSIS — I2510 Atherosclerotic heart disease of native coronary artery without angina pectoris: Secondary | ICD-10-CM | POA: Diagnosis present

## 2023-11-28 DIAGNOSIS — Z8546 Personal history of malignant neoplasm of prostate: Secondary | ICD-10-CM

## 2023-11-28 DIAGNOSIS — F02811 Dementia in other diseases classified elsewhere, unspecified severity, with agitation: Secondary | ICD-10-CM | POA: Diagnosis present

## 2023-11-28 DIAGNOSIS — Z8673 Personal history of transient ischemic attack (TIA), and cerebral infarction without residual deficits: Secondary | ICD-10-CM

## 2023-11-28 DIAGNOSIS — I4891 Unspecified atrial fibrillation: Secondary | ICD-10-CM | POA: Diagnosis present

## 2023-11-28 DIAGNOSIS — N184 Chronic kidney disease, stage 4 (severe): Secondary | ICD-10-CM | POA: Diagnosis present

## 2023-11-28 DIAGNOSIS — F05 Delirium due to known physiological condition: Secondary | ICD-10-CM | POA: Diagnosis not present

## 2023-11-28 DIAGNOSIS — E039 Hypothyroidism, unspecified: Secondary | ICD-10-CM | POA: Diagnosis present

## 2023-11-28 DIAGNOSIS — Z886 Allergy status to analgesic agent status: Secondary | ICD-10-CM

## 2023-11-28 DIAGNOSIS — Z794 Long term (current) use of insulin: Secondary | ICD-10-CM

## 2023-11-28 DIAGNOSIS — Z860100 Personal history of colon polyps, unspecified: Secondary | ICD-10-CM

## 2023-11-28 DIAGNOSIS — E440 Moderate protein-calorie malnutrition: Secondary | ICD-10-CM | POA: Diagnosis present

## 2023-11-28 DIAGNOSIS — N179 Acute kidney failure, unspecified: Secondary | ICD-10-CM | POA: Diagnosis not present

## 2023-11-28 DIAGNOSIS — G20A1 Parkinson's disease without dyskinesia, without mention of fluctuations: Secondary | ICD-10-CM | POA: Diagnosis present

## 2023-11-28 DIAGNOSIS — I63512 Cerebral infarction due to unspecified occlusion or stenosis of left middle cerebral artery: Principal | ICD-10-CM | POA: Diagnosis present

## 2023-11-28 DIAGNOSIS — R4182 Altered mental status, unspecified: Secondary | ICD-10-CM | POA: Diagnosis not present

## 2023-11-28 DIAGNOSIS — Z888 Allergy status to other drugs, medicaments and biological substances status: Secondary | ICD-10-CM

## 2023-11-28 DIAGNOSIS — I252 Old myocardial infarction: Secondary | ICD-10-CM

## 2023-11-28 DIAGNOSIS — E860 Dehydration: Secondary | ICD-10-CM | POA: Diagnosis not present

## 2023-11-28 DIAGNOSIS — Z951 Presence of aortocoronary bypass graft: Secondary | ICD-10-CM

## 2023-11-28 DIAGNOSIS — Z79890 Hormone replacement therapy (postmenopausal): Secondary | ICD-10-CM

## 2023-11-28 DIAGNOSIS — Z885 Allergy status to narcotic agent status: Secondary | ICD-10-CM

## 2023-11-28 DIAGNOSIS — R29705 NIHSS score 5: Secondary | ICD-10-CM | POA: Diagnosis present

## 2023-11-28 DIAGNOSIS — R410 Disorientation, unspecified: Principal | ICD-10-CM

## 2023-11-28 DIAGNOSIS — Z7901 Long term (current) use of anticoagulants: Secondary | ICD-10-CM

## 2023-11-28 DIAGNOSIS — I440 Atrioventricular block, first degree: Secondary | ICD-10-CM | POA: Diagnosis present

## 2023-11-28 DIAGNOSIS — E785 Hyperlipidemia, unspecified: Secondary | ICD-10-CM | POA: Diagnosis present

## 2023-11-28 DIAGNOSIS — E870 Hyperosmolality and hypernatremia: Secondary | ICD-10-CM | POA: Diagnosis not present

## 2023-11-28 DIAGNOSIS — Z882 Allergy status to sulfonamides status: Secondary | ICD-10-CM

## 2023-11-28 DIAGNOSIS — I130 Hypertensive heart and chronic kidney disease with heart failure and stage 1 through stage 4 chronic kidney disease, or unspecified chronic kidney disease: Secondary | ICD-10-CM | POA: Diagnosis present

## 2023-11-28 DIAGNOSIS — R471 Dysarthria and anarthria: Secondary | ICD-10-CM | POA: Diagnosis present

## 2023-11-28 DIAGNOSIS — R4701 Aphasia: Secondary | ICD-10-CM | POA: Diagnosis present

## 2023-11-28 DIAGNOSIS — E872 Acidosis, unspecified: Secondary | ICD-10-CM | POA: Diagnosis not present

## 2023-11-28 DIAGNOSIS — E1122 Type 2 diabetes mellitus with diabetic chronic kidney disease: Secondary | ICD-10-CM | POA: Diagnosis present

## 2023-11-28 DIAGNOSIS — R270 Ataxia, unspecified: Secondary | ICD-10-CM | POA: Diagnosis present

## 2023-11-28 DIAGNOSIS — E876 Hypokalemia: Secondary | ICD-10-CM | POA: Diagnosis not present

## 2023-11-28 DIAGNOSIS — Z8249 Family history of ischemic heart disease and other diseases of the circulatory system: Secondary | ICD-10-CM

## 2023-11-28 LAB — COMPREHENSIVE METABOLIC PANEL WITH GFR
ALT: 7 U/L (ref 0–44)
AST: 15 U/L (ref 15–41)
Albumin: 3.6 g/dL (ref 3.5–5.0)
Alkaline Phosphatase: 46 U/L (ref 38–126)
Anion gap: 7 (ref 5–15)
BUN: 35 mg/dL — ABNORMAL HIGH (ref 8–23)
CO2: 21 mmol/L — ABNORMAL LOW (ref 22–32)
Calcium: 8.4 mg/dL — ABNORMAL LOW (ref 8.9–10.3)
Chloride: 110 mmol/L (ref 98–111)
Creatinine, Ser: 3.43 mg/dL — ABNORMAL HIGH (ref 0.61–1.24)
GFR, Estimated: 17 mL/min — ABNORMAL LOW (ref >60)
Glucose, Bld: 252 mg/dL — ABNORMAL HIGH (ref 70–99)
Potassium: 4.5 mmol/L (ref 3.5–5.1)
Sodium: 138 mmol/L (ref 135–145)
Total Bilirubin: 0.8 mg/dL (ref 0.0–1.2)
Total Protein: 7.5 g/dL (ref 6.5–8.1)

## 2023-11-28 LAB — DIFFERENTIAL
Abs Immature Granulocytes: 0.01 K/uL (ref 0.00–0.07)
Basophils Absolute: 0.1 K/uL (ref 0.0–0.1)
Basophils Relative: 1 %
Eosinophils Absolute: 0.2 K/uL (ref 0.0–0.5)
Eosinophils Relative: 3 %
Immature Granulocytes: 0 %
Lymphocytes Relative: 24 %
Lymphs Abs: 1.6 K/uL (ref 0.7–4.0)
Monocytes Absolute: 0.5 K/uL (ref 0.1–1.0)
Monocytes Relative: 7 %
Neutro Abs: 4.5 K/uL (ref 1.7–7.7)
Neutrophils Relative %: 65 %

## 2023-11-28 LAB — URINALYSIS, ROUTINE W REFLEX MICROSCOPIC
Bacteria, UA: NONE SEEN
Bilirubin Urine: NEGATIVE
Glucose, UA: >=500 mg/dL — AB
Hgb urine dipstick: NEGATIVE
Ketones, ur: NEGATIVE mg/dL
Leukocytes,Ua: NEGATIVE
Nitrite: NEGATIVE
Protein, ur: >=300 mg/dL — AB
Specific Gravity, Urine: 1.008 (ref 1.005–1.030)
Squamous Epithelial / HPF: 0 /HPF (ref 0–5)
pH: 7 (ref 5.0–8.0)

## 2023-11-28 LAB — CBC
HCT: 40.8 % (ref 39.0–52.0)
Hemoglobin: 13.1 g/dL (ref 13.0–17.0)
MCH: 29.9 pg (ref 26.0–34.0)
MCHC: 32.1 g/dL (ref 30.0–36.0)
MCV: 93.2 fL (ref 80.0–100.0)
Platelets: 235 K/uL (ref 150–400)
RBC: 4.38 MIL/uL (ref 4.22–5.81)
RDW: 13.2 % (ref 11.5–15.5)
WBC: 6.8 K/uL (ref 4.0–10.5)
nRBC: 0 % (ref 0.0–0.2)

## 2023-11-28 LAB — CBG MONITORING, ED: Glucose-Capillary: 234 mg/dL — ABNORMAL HIGH (ref 70–99)

## 2023-11-28 LAB — PROTIME-INR
INR: 1.2 (ref 0.8–1.2)
Prothrombin Time: 15.7 s — ABNORMAL HIGH (ref 11.4–15.2)

## 2023-11-28 LAB — ETHANOL: Alcohol, Ethyl (B): <15 mg/dL (ref <15)

## 2023-11-28 LAB — APTT: aPTT: 36 s (ref 24–36)

## 2023-11-28 MED ORDER — SODIUM CHLORIDE 0.9% FLUSH: 3.0000 mL | Freq: Once | INTRAVENOUS | Status: DC

## 2023-11-28 MED ORDER — ALBUTEROL SULFATE (2.5 MG/3ML) 0.083% IN NEBU: 2.5000 mg | INHALATION_SOLUTION | RESPIRATORY_TRACT | Status: DC | PRN

## 2023-11-28 MED ORDER — POLYETHYLENE GLYCOL 3350 17 G PO PACK
17.0000 g | PACK | Freq: Every day | ORAL | Status: DC
  Administered 2023-11-29 – 2023-12-05 (×7): 17 g via ORAL
  Filled 2023-11-28 (×8): qty 1

## 2023-11-28 MED ORDER — ONDANSETRON HCL 4 MG/2ML IJ SOLN
4.0000 mg | Freq: Four times a day (QID) | INTRAMUSCULAR | Status: DC | PRN
Start: 2023-11-28 — End: 2023-12-13

## 2023-11-28 MED ORDER — INSULIN ASPART 100 UNIT/ML IJ SOLN
0.0000 [IU] | Freq: Three times a day (TID) | INTRAMUSCULAR | Status: DC
  Administered 2023-11-29: 1 [IU] via SUBCUTANEOUS
  Administered 2023-11-29: 2 [IU] via SUBCUTANEOUS
  Administered 2023-11-29: 3 [IU] via SUBCUTANEOUS
  Administered 2023-11-30: 1 [IU] via SUBCUTANEOUS
  Administered 2023-11-30 – 2023-12-01 (×3): 2 [IU] via SUBCUTANEOUS
  Administered 2023-12-01: 9 [IU] via SUBCUTANEOUS
  Administered 2023-12-02: 3 [IU] via SUBCUTANEOUS
  Administered 2023-12-02: 2 [IU] via SUBCUTANEOUS
  Administered 2023-12-02: 1 [IU] via SUBCUTANEOUS
  Administered 2023-12-03: 5 [IU] via SUBCUTANEOUS
  Administered 2023-12-03: 2 [IU] via SUBCUTANEOUS
  Administered 2023-12-04 – 2023-12-05 (×5): 1 [IU] via SUBCUTANEOUS
  Administered 2023-12-06 (×2): 2 [IU] via SUBCUTANEOUS
  Administered 2023-12-07: 1 [IU] via SUBCUTANEOUS
  Administered 2023-12-07: 2 [IU] via SUBCUTANEOUS
  Administered 2023-12-07 – 2023-12-08 (×3): 1 [IU] via SUBCUTANEOUS
  Administered 2023-12-08 – 2023-12-09 (×2): 2 [IU] via SUBCUTANEOUS
  Administered 2023-12-09: 1 [IU] via SUBCUTANEOUS
  Administered 2023-12-09: 2 [IU] via SUBCUTANEOUS
  Administered 2023-12-09 (×3): 1 [IU] via SUBCUTANEOUS
  Administered 2023-12-10: 2 [IU] via SUBCUTANEOUS
  Administered 2023-12-10 (×2): 3 [IU] via SUBCUTANEOUS
  Administered 2023-12-10: 2 [IU] via SUBCUTANEOUS
  Administered 2023-12-10 (×2): 3 [IU] via SUBCUTANEOUS
  Administered 2023-12-11: 2 [IU] via SUBCUTANEOUS
  Administered 2023-12-11: 3 [IU] via SUBCUTANEOUS
  Administered 2023-12-11: 2 [IU] via SUBCUTANEOUS
  Administered 2023-12-11: 3 [IU] via SUBCUTANEOUS
  Administered 2023-12-11 (×2): 5 [IU] via SUBCUTANEOUS
  Administered 2023-12-12: 1 [IU] via SUBCUTANEOUS
  Administered 2023-12-12: 2 [IU] via SUBCUTANEOUS
  Administered 2023-12-12: 3 [IU] via SUBCUTANEOUS
  Administered 2023-12-13: 1 [IU] via SUBCUTANEOUS
  Administered 2023-12-13 (×2): 3 [IU] via SUBCUTANEOUS
  Administered 2023-12-14: 2 [IU] via SUBCUTANEOUS
  Administered 2023-12-14: 3 [IU] via SUBCUTANEOUS
  Filled 2023-11-28 (×44): qty 1

## 2023-11-28 MED ORDER — ACETAMINOPHEN 650 MG RE SUPP
650.0000 mg | Freq: Four times a day (QID) | RECTAL | Status: DC | PRN
Start: 2023-11-28 — End: 2023-12-14

## 2023-11-28 MED ORDER — ACETAMINOPHEN 325 MG PO TABS: 650.0000 mg | ORAL_TABLET | Freq: Four times a day (QID) | ORAL | Status: DC | PRN

## 2023-11-28 MED ORDER — HYDRALAZINE HCL 20 MG/ML IJ SOLN
2.0000 mg | Freq: Three times a day (TID) | INTRAMUSCULAR | Status: DC | PRN
  Administered 2023-11-29: 2 mg via INTRAVENOUS
  Filled 2023-11-28: qty 1

## 2023-11-28 MED ORDER — APIXABAN 2.5 MG PO TABS
2.5000 mg | ORAL_TABLET | Freq: Two times a day (BID) | ORAL | Status: DC
  Administered 2023-11-29 – 2023-12-14 (×34): 2.5 mg via ORAL
  Filled 2023-11-28 (×32): qty 1

## 2023-11-28 MED ORDER — ONDANSETRON HCL 4 MG PO TABS: 4.0000 mg | ORAL_TABLET | Freq: Four times a day (QID) | ORAL | Status: DC | PRN

## 2023-11-28 MED ORDER — CARBIDOPA-LEVODOPA 25-100 MG PO TABS
1.0000 | ORAL_TABLET | Freq: Three times a day (TID) | ORAL | Status: DC
  Administered 2023-11-29 – 2023-12-14 (×49): 1 via ORAL
  Filled 2023-11-28 (×46): qty 1

## 2023-11-28 MED ORDER — LEVOTHYROXINE SODIUM 50 MCG PO TABS
75.0000 ug | ORAL_TABLET | Freq: Every day | ORAL | Status: DC
  Administered 2023-11-29 – 2023-12-14 (×17): 75 ug via ORAL
  Filled 2023-11-28 (×15): qty 1

## 2023-11-28 MED ORDER — CLOPIDOGREL BISULFATE 75 MG PO TABS
75.0000 mg | ORAL_TABLET | Freq: Every day | ORAL | Status: DC
  Administered 2023-11-29 – 2023-12-14 (×18): 75 mg via ORAL
  Filled 2023-11-28 (×16): qty 1

## 2023-11-28 MED ORDER — BISACODYL 5 MG PO TBEC: 5.0000 mg | DELAYED_RELEASE_TABLET | Freq: Every day | ORAL | Status: DC | PRN

## 2023-11-28 NOTE — ED Provider Notes (Signed)
 [facility] Provider Note    Event Date/Time   First MD Initiated Contact with Patient 11/28/23 2106     (approximate)   History   Altered Mental Status   HPI  Victor Gould is a 83 y.o. male who presents to the ED for evaluation of Altered Mental Status   I reviewed PCP visit from May.  Follow-up after ED visit for chest pain with a benign workup.  He has a history of CVA, DM, HTN, Parkinson's disease, CAD s/p 5 vessel CABG 2018.  Triple therapy with Eliquis , aspirin  and Plavix .  Patient presents to the ED with his wife for evaluation of confusion today.  Seen normal yesterday and this morning but this afternoon patient has had multiple episodes of inappropriate speech, word salad, disorientation.  Wife provides a multitude of examples at the bedside: using inappropriate words fluently while describing routine situations, driving up the street to visit a neighbor/family friend without apparent reason and and then this neighbor calls the wife and says that he seemed confused, forgetting the current year,   Patient reports he feels fine and has no complaints.   Physical Exam   Triage Vital Signs: ED Triage Vitals  Encounter Vitals Group     BP 11/28/23 1836 (!) 209/79     Girls Systolic BP Percentile --      Girls Diastolic BP Percentile --      Boys Systolic BP Percentile --      Boys Diastolic BP Percentile --      Pulse Rate 11/28/23 1836 (!) 58     Resp 11/28/23 1836 16     Temp 11/28/23 1836 98 F (36.7 C)     Temp Source 11/28/23 1836 Oral     SpO2 11/28/23 1836 97 %     Weight 11/28/23 1836 160 lb 0.9 oz (72.6 kg)     Height --      Head Circumference --      Peak Flow --      Pain Score 11/28/23 1835 0     Pain Loc --      Pain Education --      Exclude from Growth Chart --     Most recent vital signs: Vitals:   11/28/23 2105 11/28/23 2200  BP: (!) 216/84 (!) 215/90  Pulse: (!) 59 64  Resp: 16 18  Temp:    SpO2: 98% 99%     General: Awake, no distress.  Disoriented to the year.  Fluent speech CV:  Good peripheral perfusion.  Resp:  Normal effort.  Abd:  No distention.  MSK:  No deformity noted.  Neuro:  No focal deficits appreciated. Cranial nerves II through XII intact 5/5 strength and sensation in all 4 extremities Other:     ED Results / Procedures / Treatments   Labs (all labs ordered are listed, but only abnormal results are displayed) Labs Reviewed  PROTIME-INR - Abnormal; Notable for the following components:      Result Value   Prothrombin Time 15.7 (*)    All other components within normal limits  COMPREHENSIVE METABOLIC PANEL WITH GFR - Abnormal; Notable for the following components:   CO2 21 (*)    Glucose, Bld 252 (*)    BUN 35 (*)    Creatinine, Ser 3.43 (*)    Calcium  8.4 (*)    GFR, Estimated 17 (*)    All other components within normal limits  CBG MONITORING, ED - Abnormal; Notable  for the following components:   Glucose-Capillary 234 (*)    All other components within normal limits  APTT  CBC  DIFFERENTIAL  ETHANOL  URINALYSIS, ROUTINE W REFLEX MICROSCOPIC  CBG MONITORING, ED    EKG Sinus rhythm with a rate of 58 bpm.  Normal axis.  Mild first-degree AV block with PR interval 214, no high-grade block.  No percents of acute ischemia.  RADIOLOGY CT head interpreted by me without evidence of acute intracranial pathology  Official radiology report(s): CT HEAD WO CONTRAST Result Date: 11/28/2023 CLINICAL DATA:  Neuro deficit, acute, stroke suspected.  Confusion. EXAM: CT HEAD WITHOUT CONTRAST TECHNIQUE: Contiguous axial images were obtained from the base of the skull through the vertex without intravenous contrast. RADIATION DOSE REDUCTION: This exam was performed according to the departmental dose-optimization program which includes automated exposure control, adjustment of the mA and/or kV according to patient size and/or use of iterative reconstruction technique.  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 Old lacunar infarcts in the periventricular white matter bilaterally. There is atrophy and chronic small vessel disease changes. No acute intracranial abnormality. Specifically, no hemorrhage, hydrocephalus, mass lesion, acute infarction, or significant intracranial injury. Vascular: No hyperdense vessel or unexpected calcification. Skull: No acute calvarial abnormality. 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 No air-fluid levels throughout the remainder of the paranasal sinuses. Mastoid air cells clear. Other: None IMPRESSION: Numerous old infarcts as described above. Atrophy, chronic microvascular disease. No acute intracranial abnormality. Electronically Signed   By: Franky Crease M.D.   On: 11/28/2023 19:19    PROCEDURES and INTERVENTIONS:  .1-3 Lead EKG Interpretation  Performed by: Claudene Rover, MD Authorized by: Claudene Rover, MD     Interpretation: normal     ECG rate:  66   ECG rate assessment: normal     Rhythm: sinus rhythm     Ectopy: none     Conduction: normal     Medications  sodium chloride  flush (NS) 0.9 % injection 3 mL ( Intravenous Canceled Entry 11/28/23 1843)     IMPRESSION / MDM / ASSESSMENT AND PLAN / ED COURSE  I reviewed the triage vital signs and the nursing notes.  Differential diagnosis includes, but is not limited to, metabolic encephalopathy, stroke, polypharmacy, dementia, ICH  {Patient presents with symptoms of an acute illness or injury that is potentially life-threatening.  Patient presents with acute confusion today.  Has a nonfocal exam with me.  Hypertension is noted.  He is asymptomatic with a reassuring exam.  CKD near baseline, normal CBC.  Add on CXR and UA, will MRI brain to assess for acute ischemic stroke.  CT head without ICH.  Will consult with medicine for observation admission.   Considering the possibility of ischemic stroke and his asymptomatic status, will hold off on aggressive BP management and allow permissive hypertension at this point.      FINAL CLINICAL IMPRESSION(S) / ED DIAGNOSES   Final diagnoses:  Confusion     Rx / DC Orders   ED Discharge Orders     None        Note:  This document was prepared using Dragon voice recognition software and may include unintentional dictation errors.   Claudene Rover, MD 11/28/23 2222

## 2023-11-28 NOTE — ED Notes (Signed)
 Please address BP. Thanks

## 2023-11-28 NOTE — ED Triage Notes (Signed)
 Arrives with wife c/o new onset confusion.  Patient states he is having trouble getting words out and feels confused.  Wife endorses that patient pulled up to the house with a truck and drove off.  AAOx3.  Skin warm and dry. MAE equally and strong. Speech clear. Sensation equal.  Unable to tell me the year, but knows who the current president is.

## 2023-11-28 NOTE — ED Notes (Addendum)
 Hollace Solian MD for possibility of medication for hypertension. Last recorded BP 202/93. HR 58-64

## 2023-11-28 NOTE — H&P (Signed)
 History and Physical  Victor Gould FMW:[id number] DOB: December 15, 1940 DOA: 11/28/2023 PCP: Cleotilde Oneil FALCON, MD  Chief Complaint: confusion  Historian: patient, wife  HPI:  Victor Gould is a 83 y.o. male with a PMH significant for CAD s/p CABG, HTN, afib, CKDIII, Type II DM, hypothyroidism, h/o prostate cancer, HLD, HF, h/o CVA, subdural hematoma (3 years prior), parkinson's disease. At baseline, they live with their wife and are independent with ADLs.  They presented from home to the ED on 11/28/2023 with confusion x1 days. Wife states that he has been using inappropriate words and actions throughout the day. He did not have any injury and he denies having any symptoms. He denies any focalized weakness or speech abnormalities. He endorses good adherence with his medications which includes aspirin , plavix , and eliquis .   In the ED, it was found that they had significantly elevated blood pressure up to 216/84, mild bradycardia in high 50s. Otherwise stable.  Significant findings included: unremarkable CBC, glucose 252, Cr 3.43 ( roughly baseline) Brain MRI: 1. Small acute infarct in the left insula and many small acute infarcts in the overlying left parietal lobe (posterior left MCA territory). 2. Many remote infarcts, as detailed above. ( As shown in CT) 3. Chronic right vertebral artery occlusion.  They were initially treated with nothing.   Patient was admitted to medicine service for further workup and management of acute CVA as outlined in detail below.  Assessment/Plan Active Problems: Acute CVA   Acute CVA- patient is essentially asymptomatic and at baseline on exam. No focal neurological finding different than his baseline per pt and wife at bedside's report. He was disoriented to the year during EDP evaluation but oriented to it during my exam. Head imaging reveals several old infarcts as well as a small acute infarct on brain MRI.  - neuro consult am - continue  anticoagulation - echo - statin - PT/OT - permissive HTN with hydralazine  PRN for SBP >220.  - telemetry   Type II DM - sSSI - A1c pending  HTN- severely elevated - holding home meds during permissive HTN period, titrate back on as able. PRN ordered for SBP >220.   Parkinsons - continue home sinemet   Hypothyroidism - continue home synthroid   Past Medical History:  Diagnosis Date   ASCVD (arteriosclerotic cardiovascular disease)    Cancer (HCC)    prostate   Chronic kidney disease    Colon polyps    Constipation    Coronary artery disease    Diabetes mellitus    Family history of adverse reaction to anesthesia    children - PONV   GERD (gastroesophageal reflux disease)    Hemorrhoids    Hyperlipidemia    Hypertension    PCP Dr Oneil Cleotilde at [facility]   Hypothyroidism    MI (myocardial infarction) (HCC)    PONV (postoperative nausea and vomiting)    Thyroid disease    Vertigo    positional    Past Surgical History:  Procedure Laterality Date   APPENDECTOMY     CATARACT EXTRACTION W/PHACO Right 10/27/2018   Procedure: CATARACT EXTRACTION PHACO AND INTRAOCULAR LENS PLACEMENT (IOC) RIGHT, DIABETIC;  Surgeon: Jaye Elsie, MD;  Location: [facility];  Service: Ophthalmology;  Laterality: Right;  diabetic - insulin    CATARACT EXTRACTION W/PHACO Left 11/25/2018   Procedure: CATARACT EXTRACTION PHACO AND INTRAOCULAR LENS PLACEMENT (IOC) LEFT DIABETIC;  Surgeon: Jaye Elsie, MD;  Location: [facility];  Service: Ophthalmology;  Laterality: Left;   COLONOSCOPY  colonoscopy with polypectomy     COLONOSCOPY WITH PROPOFOL  N/A 12/02/2017   Procedure: COLONOSCOPY WITH PROPOFOL ;  Surgeon: Viktoria Lamar DASEN, MD;  Location: [facility] ENDOSCOPY;  Service: Endoscopy;  Laterality: N/A;   CORONARY ARTERY BYPASS GRAFT N/A 08/28/2016   Procedure: CORONARY ARTERY BYPASS GRAFTING (CABG) x five, using left internal mammary artery and right leg greater saphenous vein  harvested endoscopically - -LIMA to LAD, -SVG to OM, -SVG to DIAGONAL, -SEQ SVG to PDA, PLVB ;  Surgeon: Dallas KATHEE Jude, MD;  Location: [facility] OR;  Service: Open Heart Surgery;  Laterality: N/A;   CYSTOSCOPY N/A 09/29/2013   Procedure: CYSTOSCOPY;  Surgeon: Glendia DELENA Elizabeth, MD;  Location: [facility] ORS;  Service: Urology;  Laterality: N/A;   ESOPHAGOGASTRODUODENOSCOPY (EGD) WITH PROPOFOL  N/A 12/02/2017   Procedure: ESOPHAGOGASTRODUODENOSCOPY (EGD) WITH PROPOFOL ;  Surgeon: Viktoria Lamar DASEN, MD;  Location: [facility];  Service: Endoscopy;  Laterality: N/A;   HERNIA REPAIR     inguinal bilaterally   LEFT HEART CATH AND CORONARY ANGIOGRAPHY N/A 08/27/2016   Procedure: Left Heart Cath and Coronary Angiography;  Surgeon: Victory LELON Sharps, MD;  Location: [facility] INVASIVE CV LAB;  Service: Cardiovascular;  Laterality: N/A;   ROBOT ASSISTED LAPAROSCOPIC RADICAL PROSTATECTOMY  06/25/2011   Procedure: ROBOTIC ASSISTED LAPAROSCOPIC RADICAL PROSTATECTOMY LEVEL 2;  Surgeon: Noretta Ferrara, MD;  Location: [facility];  Service: Urology;  Laterality: N/A;   TEE WITHOUT CARDIOVERSION N/A 08/28/2016   Procedure: TRANSESOPHAGEAL ECHOCARDIOGRAM (TEE);  Surgeon: Dallas KATHEE Jude, MD;  Location: [facility] OR;  Service: Open Heart Surgery;  Laterality: N/A;   UPPER GASTROINTESTINAL ENDOSCOPY     URETHRAL SLING N/A 09/29/2013   Procedure: MALE SLING;  Surgeon: Glendia DELENA Elizabeth, MD;  Location: [facility] ORS;  Service: Urology;  Laterality: N/A;     reports that he has never smoked. He has never used smokeless tobacco. He reports that he does not drink alcohol and does not use drugs.  Allergies  Allergen Reactions   Naproxen Sodium Shortness Of Breath   Other Hives   Oxycodone  Hcl Nausea And Vomiting   Sulfa Antibiotics Rash and Hives   Trazodone Other (See Comments)    Hallucinations   Tramadol Other (See Comments)    dizziness   Carvedilol  Rash    Family History  Problem Relation Age of Onset   Heart attack Mother 31   Heart attack Brother      Prior to Admission medications   Medication Sig Start Date End Date Taking? Authorizing Provider  acetaminophen  (TYLENOL ) 500 MG tablet Take 500-1,000 mg by mouth daily as needed for mild pain or headache (pain).     [provider]  amLODipine  (NORVASC ) 10 MG tablet Take 5 mg by mouth daily.    [provider]  b complex vitamins tablet Take 1 tablet by mouth daily after breakfast.    [provider]  carbidopa -levodopa  (SINEMET  IR) 25-100 MG tablet Take 1 tablet by mouth 3 (three) times daily. 11/17/21   [provider]  cholecalciferol  (VITAMIN D ) 1000 units tablet Take 1,000 Units by mouth daily after breakfast.    [provider]  clopidogrel  (PLAVIX ) 75 MG tablet Take 75 mg by mouth daily. 10/11/21   [provider]  ELIQUIS  2.5 MG TABS tablet Take 2.5 mg by mouth 2 (two) times daily. 10/23/21   [provider]  HYDROcodone -acetaminophen  (NORCO/VICODIN) 5-325 MG tablet Take 2 tablets by mouth every 6 (six) hours as needed for moderate pain or severe pain. Patient not taking: Reported  on 06/18/2023 11/29/21   Gordan Huxley, MD  hydrocortisone cream 1 % Apply 1 application topically daily as needed for itching.    [provider]  insulin  glargine (LANTUS ) 100 unit/mL SOPN Inject 6-12 Units into the skin See admin instructions. Inject 12 units 10 minutes before breakfast, and 6 units before bed    [provider]  insulin  lispro (HUMALOG) 100 UNIT/ML KiwkPen Inject 7-10 Units into the skin See admin instructions. Inject 7 units subcutaneously before breakfast, lunch and dinner - plus adjustment for CBG 140-180 add 1 unit, 180-220 add 3 units, >220 add 3 units    [provider]  levETIRAcetam  (KEPPRA ) 500 MG tablet Take 1 tablet (500 mg total) by mouth 2 (two) times daily for 5 days. 03/04/21 03/09/21  Meuth, Brooke A, PA-C  levothyroxine  (SYNTHROID , LEVOTHROID) 75 MCG tablet Take 75 mcg by mouth daily before  breakfast.    [provider]  losartan  (COZAAR ) 25 MG tablet Take 25 mg by mouth daily. Pt reports taking 1/2 tablet in AM and 1/2 tablet in afternoon 11/15/21   [provider]  Multiple Vitamin (MULTIVITAMIN WITH MINERALS) TABS tablet Take 1 tablet by mouth daily after breakfast.    [provider]  omeprazole (PRILOSEC) 20 MG capsule Take 20 mg by mouth daily before breakfast.     [provider]  Polyethyl Glycol-Propyl Glycol (SYSTANE FREE OP) Place 1 drop into both eyes daily as needed (dry eyes). Preservative-free    [provider]  triamcinolone cream (KENALOG) 0.5 % Apply topically 2 (two) times daily. 11/01/21   [provider]   I have personally, briefly reviewed patient's prior medical records in [facility]  Objective: Blood pressure (!) 215/90, pulse 64, temperature 97.6 F (36.4 C), temperature source Oral, resp. rate 18, weight 72.6 kg, SpO2 99%.   Constitutional: NAD, calm, comfortable Respiratory: Normal respiratory effort. No accessory muscle use.  Cardiovascular: RRR, No extremity edema. 2+ pedal pulses. no clubbing / cyanosis.  Musculoskeletal: No joint deformity upper and lower extremities. Normal muscle tone.  Skin: dry, intact, normal color, normal temperature on exposed skin Neurologic: Alert and oriented x 3. Normal speech. Grossly non-focal exam. PERRL. Hard of hearing Psychiatric: Normal mood. Congruent affect.  Labs on Admission: I have personally reviewed admission labs and imaging studies  CBC    Component Value Date/Time   WBC 6.8 11/28/2023 1846   RBC 4.38 11/28/2023 1846   HGB 13.1 11/28/2023 1846   HGB 13.2 12/08/2011 0600   HCT 40.8 11/28/2023 1846   HCT 37.5 (L) 12/08/2011 0600   PLT 235 11/28/2023 1846   PLT 194 12/08/2011 0600   MCV 93.2 11/28/2023 1846   MCV 88 12/08/2011 0600   MCH 29.9 11/28/2023 1846   MCHC 32.1 11/28/2023 1846   RDW 13.2 11/28/2023 1846   RDW 13.1 12/08/2011  0600   LYMPHSABS 1.6 11/28/2023 1846   LYMPHSABS 1.5 12/08/2011 0600   MONOABS 0.5 11/28/2023 1846   MONOABS 0.2 12/08/2011 0600   EOSABS 0.2 11/28/2023 1846   EOSABS 0.0 12/08/2011 0600   BASOSABS 0.1 11/28/2023 1846   BASOSABS 0.0 12/08/2011 0600   CMP     Component Value Date/Time   NA 138 11/28/2023 1846   NA 138 12/08/2011 0600   K 4.5 11/28/2023 1846   K 3.9 12/08/2011 0600   CL 110 11/28/2023 1846   CL 106 12/08/2011 0600   CO2 21 (L) 11/28/2023 1846   CO2 22 12/08/2011 0600  GLUCOSE 252 (H) 11/28/2023 1846   GLUCOSE 250 (H) 12/08/2011 0600   BUN 35 (H) 11/28/2023 1846   BUN 24 (H) 12/08/2011 0600   CREATININE 3.43 (H) 11/28/2023 1846   CREATININE 1.40 (H) 12/08/2011 0600   CALCIUM  8.4 (L) 11/28/2023 1846   CALCIUM  8.9 12/08/2011 0600   PROT 7.5 11/28/2023 1846   ALBUMIN  3.6 11/28/2023 1846   AST 15 11/28/2023 1846   ALT 7 11/28/2023 1846   ALKPHOS 46 11/28/2023 1846   BILITOT 0.8 11/28/2023 1846   GFRNONAA 17 (L) 11/28/2023 1846   GFRNONAA 49 (L) 12/08/2011 0600   GFRAA 32 (L) 05/28/2019 1450   GFRAA 57 (L) 12/08/2011 0600    Radiological Exams on Admission: MR BRAIN WO CONTRAST Result Date: 11/29/2023 CLINICAL DATA:  eval acute cva. vasculopath with acute confusion, word salad, disorientation. nonfocal exa,m EXAM: MRI HEAD WITHOUT CONTRAST TECHNIQUE: Multiplanar, multiecho pulse sequences of the brain and surrounding structures were obtained without intravenous contrast. COMPARISON:  CT head from earlier today.  MRI head 01/18/2021. FINDINGS: Brain: Small acute infarct in the left insula and many small acute infarcts in the overlying left parietal lobe (posterior left MCA territory). No mass effect. No midline shift. No evidence of acute hemorrhage, mass lesion, or hydrocephalus. Remote right PCA territory infarcts in many remote bilateral cerebellar infarcts. Remote perforator infarct in the left basal ganglia. Remote right frontal infarcts. Remote left thalamic  infarct. Vascular: Major arterial flow voids are maintained at the skull base. Skull and upper cervical spine: Abnormal flow related signal in the right vertebral artery, compatible with known occlusion seen on prior MRA. Sinuses/Orbits: Right maxillary sinus opacification. Other: No mastoid effusions. IMPRESSION: 1. Small acute infarct in the left insula and many small acute infarcts in the overlying left parietal lobe (posterior left MCA territory). 2. Many remote infarcts, as detailed above. 3. Chronic right vertebral artery occlusion. Electronically Signed   By: Gilmore GORMAN Molt M.D.   On: 11/29/2023 00:16   DG Chest Portable 1 View Result Date: 11/28/2023 CLINICAL DATA:  Encephalopathy EXAM: PORTABLE CHEST 1 VIEW COMPARISON:  Chest x-ray 09/07/2023 FINDINGS: Sternotomy wires are again seen. Cardiomediastinal silhouette is within normal limits. Large hiatal hernia again noted. No focal lung infiltrate, pleural effusion or pneumothorax. Small nodular density projects of the left costophrenic angle. No acute fractures are seen. IMPRESSION: 1. No acute cardiopulmonary process. 2. Large hiatal hernia. 3. Small nodular density projects of the left costophrenic angle. Recommend further evaluation with nonemergent chest CT. Electronically Signed   By: Greig Pique M.D.   On: 11/28/2023 22:24   CT HEAD WO CONTRAST Result Date: 11/28/2023 CLINICAL DATA:  Neuro deficit, acute, stroke suspected.  Confusion. EXAM: CT HEAD WITHOUT CONTRAST TECHNIQUE: Contiguous axial images were obtained from the base of the skull through the vertex without intravenous contrast. RADIATION DOSE REDUCTION: This exam was performed according to the departmental dose-optimization program which includes automated exposure control, adjustment of the mA and/or kV according to patient size and/or use of iterative reconstruction technique. 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 Old  lacunar infarcts in the periventricular white matter bilaterally. There is atrophy and chronic small vessel disease changes. No acute intracranial abnormality. Specifically, no hemorrhage, hydrocephalus, mass lesion, acute infarction, or significant intracranial injury. Vascular: No hyperdense vessel or unexpected calcification. Skull: No acute calvarial abnormality. 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 No air-fluid levels throughout the remainder of the paranasal sinuses. Mastoid  air cells clear. Other: None IMPRESSION: Numerous old infarcts as described above. Atrophy, chronic microvascular disease. No acute intracranial abnormality. Electronically Signed   By: Franky Crease M.D.   On: 11/28/2023 19:19   EKG: Independently reviewed. NSR  DVT prophylaxis: apixaban  (ELIQUIS ) tablet 2.5 mg Start: 11/29/23 0045 apixaban  (ELIQUIS ) tablet 2.5 mg   Code Status: full  Family Communication: wife at bedside  Disposition Plan: admit to telemetry  Consults called: none, pending MRI results   Marien LITTIE Piety, DO [facility]  11/29/2023, 12:39 AM    To contact the appropriate [facility] Attending or Consulting provider: Check [email or web address] for coverage from 7pm-7am

## 2023-11-29 ENCOUNTER — Inpatient Hospital Stay

## 2023-11-29 DIAGNOSIS — R4701 Aphasia: Secondary | ICD-10-CM | POA: Diagnosis present

## 2023-11-29 DIAGNOSIS — E039 Hypothyroidism, unspecified: Secondary | ICD-10-CM | POA: Diagnosis present

## 2023-11-29 DIAGNOSIS — I639 Cerebral infarction, unspecified: Secondary | ICD-10-CM | POA: Diagnosis present

## 2023-11-29 DIAGNOSIS — E1122 Type 2 diabetes mellitus with diabetic chronic kidney disease: Secondary | ICD-10-CM | POA: Diagnosis present

## 2023-11-29 DIAGNOSIS — R270 Ataxia, unspecified: Secondary | ICD-10-CM | POA: Diagnosis present

## 2023-11-29 DIAGNOSIS — E870 Hyperosmolality and hypernatremia: Secondary | ICD-10-CM | POA: Diagnosis not present

## 2023-11-29 DIAGNOSIS — G20A1 Parkinson's disease without dyskinesia, without mention of fluctuations: Secondary | ICD-10-CM | POA: Diagnosis present

## 2023-11-29 DIAGNOSIS — N179 Acute kidney failure, unspecified: Secondary | ICD-10-CM | POA: Diagnosis not present

## 2023-11-29 DIAGNOSIS — F02811 Dementia in other diseases classified elsewhere, unspecified severity, with agitation: Secondary | ICD-10-CM | POA: Diagnosis present

## 2023-11-29 DIAGNOSIS — I2510 Atherosclerotic heart disease of native coronary artery without angina pectoris: Secondary | ICD-10-CM | POA: Diagnosis present

## 2023-11-29 DIAGNOSIS — F05 Delirium due to known physiological condition: Secondary | ICD-10-CM | POA: Diagnosis not present

## 2023-11-29 DIAGNOSIS — R29705 NIHSS score 5: Secondary | ICD-10-CM | POA: Diagnosis not present

## 2023-11-29 DIAGNOSIS — I130 Hypertensive heart and chronic kidney disease with heart failure and stage 1 through stage 4 chronic kidney disease, or unspecified chronic kidney disease: Secondary | ICD-10-CM | POA: Diagnosis present

## 2023-11-29 DIAGNOSIS — I5032 Chronic diastolic (congestive) heart failure: Secondary | ICD-10-CM | POA: Diagnosis present

## 2023-11-29 DIAGNOSIS — R4182 Altered mental status, unspecified: Secondary | ICD-10-CM | POA: Diagnosis present

## 2023-11-29 DIAGNOSIS — Z515 Encounter for palliative care: Secondary | ICD-10-CM | POA: Diagnosis not present

## 2023-11-29 DIAGNOSIS — E440 Moderate protein-calorie malnutrition: Secondary | ICD-10-CM | POA: Diagnosis present

## 2023-11-29 DIAGNOSIS — E860 Dehydration: Secondary | ICD-10-CM | POA: Diagnosis not present

## 2023-11-29 DIAGNOSIS — Z794 Long term (current) use of insulin: Secondary | ICD-10-CM | POA: Diagnosis not present

## 2023-11-29 DIAGNOSIS — I63512 Cerebral infarction due to unspecified occlusion or stenosis of left middle cerebral artery: Secondary | ICD-10-CM | POA: Diagnosis present

## 2023-11-29 DIAGNOSIS — E876 Hypokalemia: Secondary | ICD-10-CM | POA: Diagnosis not present

## 2023-11-29 DIAGNOSIS — E785 Hyperlipidemia, unspecified: Secondary | ICD-10-CM | POA: Diagnosis present

## 2023-11-29 DIAGNOSIS — N184 Chronic kidney disease, stage 4 (severe): Secondary | ICD-10-CM | POA: Diagnosis present

## 2023-11-29 DIAGNOSIS — I440 Atrioventricular block, first degree: Secondary | ICD-10-CM | POA: Diagnosis present

## 2023-11-29 DIAGNOSIS — E872 Acidosis, unspecified: Secondary | ICD-10-CM | POA: Diagnosis not present

## 2023-11-29 DIAGNOSIS — I4891 Unspecified atrial fibrillation: Secondary | ICD-10-CM | POA: Diagnosis present

## 2023-11-29 LAB — GLUCOSE, CAPILLARY
Glucose-Capillary: 121 mg/dL — ABNORMAL HIGH (ref 70–99)
Glucose-Capillary: 227 mg/dL — ABNORMAL HIGH (ref 70–99)
Glucose-Capillary: 193 mg/dL — ABNORMAL HIGH (ref 70–99)
Glucose-Capillary: 225 mg/dL — ABNORMAL HIGH (ref 70–99)

## 2023-11-29 LAB — CBC
HCT: 39.7 % (ref 39.0–52.0)
Hemoglobin: 13.6 g/dL (ref 13.0–17.0)
MCH: 30.6 pg (ref 26.0–34.0)
MCHC: 34.3 g/dL (ref 30.0–36.0)
MCV: 89.4 fL (ref 80.0–100.0)
Platelets: 230 K/uL (ref 150–400)
RBC: 4.44 MIL/uL (ref 4.22–5.81)
RDW: 13.2 % (ref 11.5–15.5)
WBC: 7.8 K/uL (ref 4.0–10.5)
nRBC: 0 % (ref 0.0–0.2)

## 2023-11-29 LAB — BASIC METABOLIC PANEL WITH GFR
Anion gap: 7 (ref 5–15)
BUN: 35 mg/dL — ABNORMAL HIGH (ref 8–23)
CO2: 22 mmol/L (ref 22–32)
Calcium: 8.2 mg/dL — ABNORMAL LOW (ref 8.9–10.3)
Chloride: 111 mmol/L (ref 98–111)
Creatinine, Ser: 3.21 mg/dL — ABNORMAL HIGH (ref 0.61–1.24)
GFR, Estimated: 18 mL/min — ABNORMAL LOW (ref >60)
Glucose, Bld: 103 mg/dL — ABNORMAL HIGH (ref 70–99)
Potassium: 3.6 mmol/L (ref 3.5–5.1)
Sodium: 140 mmol/L (ref 135–145)

## 2023-11-29 LAB — HEMOGLOBIN A1C
Hgb A1c MFr Bld: 7.4 % — ABNORMAL HIGH (ref 4.8–5.6)
Mean Plasma Glucose: 166 mg/dL

## 2023-11-29 MED ORDER — ATORVASTATIN CALCIUM 20 MG PO TABS
40.0000 mg | ORAL_TABLET | Freq: Every day | ORAL | Status: DC
  Administered 2023-11-29 – 2023-12-14 (×16): 40 mg via ORAL
  Filled 2023-11-29 (×16): qty 2

## 2023-11-29 NOTE — Evaluation (Signed)
 Physical Therapy Evaluation Patient Details Name: Victor Gould MRN: [id number] DOB: 11/19/1940 Today's Date: 11/29/2023  History of Present Illness  Pt is an 83 y.o. male presented from home to the ED on 11/28/2023 with confusion and using inappropriate words and actions throughout the day. 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 PMH significant for CAD s/p CABG, HTN, afib, CKDIII, Type II DM, hypothyroidism, h/o prostate cancer, HLD, HF, h/o CVA, subdural hematoma (3 years prior), parkinson's disease.   Clinical Impression  Pt admitted with above diagnosis. Pt currently with functional limitations due to the deficits listed below (see PT Problem List). Pt received supine in bed agreeable to PT with spouse present. Pt accurately reports PLOF and home lay out but is only A&Ox2 to person and place. Reports PTA being indep with gait and ADL's.   To date, pt follows all single commands without issue. Mod-I for bed mobility and STS. Reliant on CGA to supervision with gait ~150'. Mildly unsteady during gait with intermittent variable step lengths and drifting warranting CGA but otherwise supervision. Per pt and spouse, gait is at baseline so anticipate some gait deviations present prior to CVA. No signs of bradykinesia or shuffling/festinating gait as pt has known diagnosis of PD. Safely able to asc/des 8 steps with single rail use forwards and with reciprocal pattern. Pt returns to supine with all needs in reach. Time spent discussing benefits of HHPT due to pt's history of prior falls and active lifestyle outdoors that PT can not screen or assess today. Anticipate pt is close to baseline. Will maintain on PT caseload to ensure regular mobility and to prevent acute deconditioning.      If plan is discharge home, recommend the following: Assist for transportation;Assistance with cooking/housework;Supervision due to cognitive status   Can travel by  private vehicle        Equipment Recommendations None recommended by PT  Recommendations for Other Services       Functional Status Assessment Patient has had a recent decline in their functional status and demonstrates the ability to make significant improvements in function in a reasonable and predictable amount of time.     Precautions / Restrictions        Mobility  Bed Mobility Overal bed mobility: Modified Independent Bed Mobility: Supine to Sit, Sit to Supine     Supine to sit: Modified independent (Device/Increase time) Sit to supine: Modified independent (Device/Increase time)     Patient Response: Cooperative, Flat affect  Transfers Overall transfer level: Modified independent                      Ambulation/Gait Ambulation/Gait assistance: Supervision, Contact guard assist Gait Distance (Feet): 150 Feet Assistive device: None Gait Pattern/deviations: Step-through pattern, Drifts right/left, Trunk flexed       General Gait Details: mildly kyphotic and generally unsteady with 2-3 instances of mild drifting and variable step lengths. Anticipate some baseline gait abnormaility as pt and spouse report gait is at baseline. No typical  gait abnormality you would expect with pt having PD.  Stairs Stairs: Yes Stairs assistance: Modified independent (Device/Increase time) Stair Management: One rail Right, Alternating pattern, Forwards Number of Stairs: 4 General stair comments: asc/desc x2 safely  Wheelchair Mobility     Tilt Bed Tilt Bed Patient Response: Cooperative, Flat affect  Modified Rankin (Stroke Patients Only)       Balance Overall balance assessment: Needs assistance Sitting-balance  support: Feet supported Sitting balance-Leahy Scale: Normal     Standing balance support: No upper extremity supported Standing balance-Leahy Scale: Good                               Pertinent Vitals/Pain Pain Assessment Pain  Assessment: No/denies pain    Home Living Family/patient expects to be discharged to:: Private residence Living Arrangements: Spouse/significant other Available Help at Discharge: Family;Available 24 hours/day Type of Home: House Home Access: Stairs to enter Entrance Stairs-Rails: Right;Left Entrance Stairs-Number of Steps: 3 Alternate Level Stairs-Number of Steps: flight of steps to get to basement where laundry and work area are Home Layout: Laundry or work area in basement;Two level;Able to live on main level with bedroom/bathroom Home Equipment: Crutches;Cane - quad;Rolling Walker (2 wheels);Rollator (4 wheels);Shower seat;BSC/3in1 Additional Comments: *told PT he had no DME*    Prior Function Prior Level of Function : Independent/Modified Independent;History of Falls (last six months)             Mobility Comments: IND without AD however endorses 1 fall in Jan/Feb with L wrist fx that is now healed ADLs Comments: IND with ADLs, mows the lawn riding mower and rides tractor     Extremity/Trunk Assessment   Upper Extremity Assessment Upper Extremity Assessment: Defer to OT evaluation    Lower Extremity Assessment Lower Extremity Assessment: Overall WFL for tasks assessed       Communication   Communication Communication: Impaired Factors Affecting Communication: Hearing impaired    Cognition Arousal: Alert Behavior During Therapy: WFL for tasks assessed/performed   PT - Cognitive impairments: Memory                       PT - Cognition Comments: A&O to person and place only Following commands: Intact       Cueing Cueing Techniques: Verbal cues     General Comments General comments (skin integrity, edema, etc.): pt with mild headache and elevated BP at end of session 213/85, however with 2 mins rest decreased to 202/75 with MD present    Exercises Other Exercises Other Exercises: discussed with pt and spouse on benefits of HHPT.    Assessment/Plan    PT Assessment Patient needs continued PT services  PT Problem List Decreased strength;Decreased balance;Decreased cognition       PT Treatment Interventions DME instruction;Therapeutic activities;Gait training;Therapeutic exercise;Patient/family education;Stair training;Balance training;Functional mobility training;Neuromuscular re-education    PT Goals (Current goals can be found in the Care Plan section)  Acute Rehab PT Goals Patient Stated Goal: to go home PT Goal Formulation: With patient Time For Goal Achievement: 12/13/23 Potential to Achieve Goals: Good    Frequency Min 1X/week     Co-evaluation               AM-PAC PT 6 Clicks Mobility  Outcome Measure Help needed turning from your back to your side while in a flat bed without using bedrails?: None Help needed moving from lying on your back to sitting on the side of a flat bed without using bedrails?: None Help needed moving to and from a bed to a chair (including a wheelchair)?: A Little Help needed standing up from a chair using your arms (e.g., wheelchair or bedside chair)?: A Little Help needed to walk in hospital room?: A Little Help needed climbing 3-5 steps with a railing? : A Little 6 Click Score: 20    End of  Session   Activity Tolerance: Patient tolerated treatment well Patient left: in bed;with call bell/phone within reach;with bed alarm set;with family/visitor present Nurse Communication: Mobility status PT Visit Diagnosis: Unsteadiness on feet (R26.81);History of falling (Z91.81)    Time: 8572-8549 PT Time Calculation (min) (ACUTE ONLY): 23 min   Charges:   PT Evaluation $PT Eval Low Complexity: 1 Low PT Treatments $Self Care/Home Management: 8-22 PT General Charges $$ ACUTE PT VISIT: 1 Visit         Dorina HERO. Fairly IV, PT, DPT Physical Therapist- [place]  [facility] 11/29/2023, 3:33 PM

## 2023-11-29 NOTE — Progress Notes (Signed)
 Pt alert and oriented to room, wife at bedside. Pt BP sys 230, 2 mg IV hydralazine  given. Pt  brief soiled, incont x2 , cleaned and got comfortable. Educated pt and wife of urinal and pt used while in room.  POC continues.

## 2023-11-29 NOTE — Evaluation (Signed)
 Occupational Therapy Evaluation Patient Details Name: Victor Gould MRN: [id number] DOB: 1940/10/31 Today's Date: 11/29/2023   History of Present Illness   Pt is an 83 y.o. male presented from home to the ED on 11/28/2023 with confusion and using inappropriate words and actions throughout the day. 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 PMH significant for CAD s/p CABG, HTN, afib, CKDIII, Type II DM, hypothyroidism, h/o prostate cancer, HLD, HF, h/o CVA, subdural hematoma (3 years prior), parkinson's disease.     Clinical Impressions Pt was seen for OT evaluation this date. PTA, pt resides in a 2 level home with his wife with 3 STE. The laundry/work area is located in the basement via a flight of steps or can be accessed via level entry if driven to the entryway for the basement. Pt is IND with no AD use for all ambulation, gets on the tractor, mows the lawn, and tends to all ADLs. 1 fall earlier this year with resulting L wrist fx which has healed.   Pt presents to acute OT demonstrating impaired ADL performance and functional mobility 2/2 very mild weakness and baseline mild balance deficits from his PD. Most notable difficulty seems to be his word finding and memory. Pt able to state names of some common items, but not all. Per wife this is much improved to when she brought him in.He is alert, but unable to state the month, year, or his age. He knows his name and his wife in room, as well as that he is in a hospital. He demo oral care standing at sink with supervision and cues to put toothpaste on brush. Bed mobility performed with MOD I and ambulation/transfers with supervision without AD use. No LOB throughout session. BP did elevate during session with MD present and aware as they are allowing permissive HTN. Will follow acutely to continue addressing pt cognition and safety awareness to safely return home, but do not anticipate the need  for follow up OT services upon acute hospital DC.      If plan is discharge home, recommend the following:   Direct supervision/assist for medications management;Supervision due to cognitive status;Direct supervision/assist for financial management     Functional Status Assessment   Patient has had a recent decline in their functional status and demonstrates the ability to make significant improvements in function in a reasonable and predictable amount of time.     Equipment Recommendations   None recommended by OT     Recommendations for Other Services         Precautions/Restrictions   Precautions Precautions: Fall Recall of Precautions/Restrictions: Intact Restrictions Weight Bearing Restrictions Per Provider Order: No     Mobility Bed Mobility Overal bed mobility: Needs Assistance Bed Mobility: Supine to Sit, Sit to Supine     Supine to sit: Modified independent (Device/Increase time) Sit to supine: Modified independent (Device/Increase time)        Transfers Overall transfer level: Modified independent                 General transfer comment: supervision for STS and SUP/SBA for ambulation ~60ft with no LOB      Balance Overall balance assessment: Needs assistance Sitting-balance support: Feet supported Sitting balance-Leahy Scale: Good     Standing balance support: No upper extremity supported Standing balance-Leahy Scale: Good Standing balance comment: no LOB noted  ADL either performed or assessed with clinical judgement   ADL Overall ADL's : Needs assistance/impaired     Grooming: Oral care;Wash/dry face;Standing;Supervision/safety;Cueing for sequencing Grooming Details (indicate cue type and reason): brushed teeth without toothpaste, cues to add                             Functional mobility during ADLs: Supervision/safety       Vision         Perception          Praxis         Pertinent Vitals/Pain Pain Assessment Pain Assessment: No/denies pain     Extremity/Trunk Assessment Upper Extremity Assessment Upper Extremity Assessment: Overall WFL for tasks assessed   Lower Extremity Assessment Lower Extremity Assessment: Overall WFL for tasks assessed (does have PD)       Communication Communication Communication: Impaired Factors Affecting Communication: Hearing impaired   Cognition Arousal: Alert Behavior During Therapy: WFL for tasks assessed/performed               OT - Cognition Comments: oriented to person, knows he is in a hospital; unable to state exact location, month or year, unable to recall his age; pt follows commands well, word finding difficulty mostly                 Following commands: Intact       Cueing  General Comments   Cueing Techniques: Verbal cues  pt with mild headache and elevated BP at end of session 213/85, however with 2 mins rest decreased to 202/75 with MD present   Exercises Other Exercises Other Exercises: Edu on role of OT in acute settting.   Shoulder Instructions      Home Living Family/patient expects to be discharged to:: Private residence Living Arrangements: Spouse/significant other Available Help at Discharge: Family;Available 24 hours/day Type of Home: House Home Access: Stairs to enter [employer] of Steps: 3 Entrance Stairs-Rails: Right Home Layout: Laundry or work area in basement;Two level;Able to live on main level with bedroom/bathroom Alternate Level Stairs-Number of Steps: flight of steps to get to basement where laundry and work area are   [employer] Shower/Tub: Chief Strategy Officer: Standard Bathroom Accessibility: Yes How Accessible: Accessible via wheelchair Home Equipment: Crutches;Cane - quad;Rolling Walker (2 wheels);Rollator (4 wheels);Shower seat;BSC/3in1          Prior Functioning/Environment Prior Level of Function :  Independent/Modified Independent;History of Falls (last six months)             Mobility Comments: IND without AD however endorses 1 fall in Jan/Feb with L wrist fx that is now healed ADLs Comments: IND with ADLs, mows the lawn riding mower and rides tractor    OT Problem List: Decreased cognition   OT Treatment/Interventions: Self-care/ADL training;Therapeutic exercise;Therapeutic activities;Cognitive remediation/compensation;Patient/family education;Balance training      OT Goals(Current goals can be found in the care plan section)   Acute Rehab OT Goals Patient Stated Goal: go home OT Goal Formulation: With patient/family Time For Goal Achievement: 12/13/23 Potential to Achieve Goals: Good   OT Frequency:  Min 1X/week    Co-evaluation              AM-PAC OT 6 Clicks Daily Activity     Outcome Measure Help from another person eating meals?: None Help from another person taking care of personal grooming?: None Help from another person toileting, which includes using toliet, bedpan,  or urinal?: None Help from another person bathing (including washing, rinsing, drying)?: A Little Help from another person to put on and taking off regular upper body clothing?: None Help from another person to put on and taking off regular lower body clothing?: A Little 6 Click Score: 22   End of Session Equipment Utilized During Treatment: Gait belt Nurse Communication: Mobility status  Activity Tolerance: Patient tolerated treatment well Patient left: in bed;with call bell/phone within reach;with bed alarm set;with family/visitor present  OT Visit Diagnosis: Other abnormalities of gait and mobility (R26.89)                Time: 8867-8794 OT Time Calculation (min): 33 min Charges:  OT General Charges $OT Visit: 1 Visit OT Evaluation $OT Eval Moderate Complexity: 1 Mod OT Treatments $Self Care/Home Management : 8-22 mins Jaysion Ramseyer, OTR/L 11/29/23, 1:57 PM  Ola Fawver E  Eisley Barber 11/29/2023, 1:53 PM

## 2023-11-29 NOTE — Progress Notes (Signed)
 SLP Cancellation Note  Patient Details Name: Victor Gould MRN: [id number] DOB: 1940/09/22   Cancelled treatment:       Reason Eval/Treat Not Completed: Patient at procedure or test/unavailable (upon arriving to room, pt headed to test. Wife in room.)  Pt being transported to Ultrasound upon coming to room.  Wife present in room and discussed w/ this SLP that pt has had RECENT ST services [facility]) s/p Falls at home resulting in closed fracture of distal end of left radius 06/2023.  Pt received OP Speech therapy in 2024 related to his Parkinson's Disease and has Baseline Cognitive change/decline since 2022 per report.   Pt's verbal communication is improved since admit yesterday per MD note today. Pt's MRI revealed BOTH new and old infarcts, including a long-standing right vertebral artery occlusion.  Suspect pt could benefit from f/u w/ ST services Post D/C home to target functional communication and give education on supportive strategies to Wife and Family to best enhance pt's communication success in ADLs.  Wife agreed that Home Health ST services w/ his recent therapy service could be helpful.   ST services will f/u for any needs while admitted. Wife agreed.      Comer Portugal, MS, CCC-SLP Speech Language Pathologist Rehab Services; [facility] [phone] (ascom) Coco Sharpnack 11/29/2023, 3:56 PM

## 2023-11-29 NOTE — Progress Notes (Signed)
 Patient was off the floor at 1620 for his NIH assessment.  Assessment obtained when patient returned to the floor

## 2023-11-29 NOTE — Plan of Care (Signed)
 Pt confused, redirectable. Problem: Coping: Goal: Ability to adjust to condition or change in health will improve Outcome: Progressing   Problem: Fluid Volume: Goal: Ability to maintain a balanced intake and output will improve Outcome: Progressing   Problem: Metabolic: Goal: Ability to maintain appropriate glucose levels will improve Outcome: Progressing   Problem: Skin Integrity: Goal: Risk for impaired skin integrity will decrease Outcome: Progressing   Problem: Tissue Perfusion: Goal: Adequacy of tissue perfusion will improve Outcome: Progressing   Problem: Clinical Measurements: Goal: Ability to maintain clinical measurements within normal limits will improve Outcome: Progressing   Problem: Pain Managment: Goal: General experience of comfort will improve and/or be controlled Outcome: Progressing   Problem: Elimination: Goal: Will not experience complications related to bowel motility Outcome: Progressing

## 2023-11-29 NOTE — Progress Notes (Signed)
 Pt passed swallow test

## 2023-11-29 NOTE — Progress Notes (Signed)
 Progress Note   Patient: Victor Gould FMW:[id number] DOB: January 17, 1941 DOA: 11/28/2023     1 DOS: the patient was seen and examined on 11/29/2023   Brief hospital course: The patient was in his usual state of health--independent with ADLs, living at home with his wife--until July?31, 2025, when his wife noted new onset of confusion. He began to use inappropriate words and actions intermittently throughout that day. He denies any preceding trauma, fall, or focal neurological symptoms. He has no weakness, no speech difficulty, no sensory changes. He maintains good medication adherence, including aspirin , clopidogrel , and apixaban  (Eliquis ). On arrival to the ED, his blood pressure measured as high as 216/84?mmHg, and his heart rate was mildly bradycardic in the high 50s, while oxygenation and other vital signs remained stable. Initial labs showed a serum glucose of 252?mg/dL and creatinine 6.56?fh/iO, roughly baseline for his CKD III. CBC was unremarkable. Brain MRI revealed a small acute infarct in the left insular cortex and multiple small acute infarcts in the overlying left parietal lobe, consistent with posterior left MCA territory ischemia. There were numerous remote infarcts, chronic findings including a long-standing right vertebral artery occlusion. No mass lesions or acute hemorrhages. He was admitted to the medicine service for acute ischemic stroke management, with further workup and treatment ongoing.  Assessment and Plan:  Acute CVA- patient is essentially asymptomatic and at baseline on exam. No focal neurological finding different than his baseline per pt and wife at bedside's report. He was disoriented to the year during EDP evaluation but oriented to it during my exam. Head imaging reveals several old infarcts as well as a small acute infarct on brain MRI.  - neuro recommends MRA and carotid duplex.  - continue anticoagulation - echo  - statin - PT/OT - permissive HTN with  hydralazine  PRN for SBP >220.  - telemetry    Type II DM - sSSI - A1c pending   HTN- severely elevated - holding home meds during permissive HTN period, titrate back on as able. PRN ordered for SBP >220.    Parkinsons - continue home sinemet    Hypothyroidism - continue home synthroid      Subjective: seen and examined with no overnight event. Neurology saw recommends MRA and carotid duplex.   Physical Exam: Vitals:   11/29/23 0006 11/29/23 0200 11/29/23 0410 11/29/23 0819  BP: (!) 230/88 (!) 191/84 (!) 198/80 (!) 186/69  Pulse: 67 60 (!) 58 60  Resp: 18 17 16 18   Temp: 98.6 F (37 C) 98 F (36.7 C) 98.1 F (36.7 C) 97.6 F (36.4 C)  TempSrc:  Oral    SpO2: 100% 100% 96% 96%  Weight:       Physical Exam HENT:     Head: Normocephalic and atraumatic.     Mouth/Throat:     Mouth: Mucous membranes are dry.  Eyes:     Extraocular Movements: Extraocular movements intact.     Pupils: Pupils are equal, round, and reactive to light.  Cardiovascular:     Rate and Rhythm: Normal rate.     Pulses: Normal pulses.  Pulmonary:     Effort: Pulmonary effort is normal.  Abdominal:     Palpations: Abdomen is soft.  Musculoskeletal:        General: Normal range of motion.  Skin:    Findings: Bruising present.  Neurological:     General: No focal deficit present.     Cranial Nerves: No cranial nerve deficit.     Gait: Gait abnormal.  Data Reviewed:  There are no new results to review at this time.  Family Communication: Wife by bedside updated about the status  Disposition: Status is: Inpatient Remains inpatient appropriate because: workup pending for acute stroke.   Planned Discharge Destination: Home    Time spent: 32 minutes  Author: Albina Sor, MD 11/29/2023 9:18 AM  For on call review [email or web address].

## 2023-11-30 DIAGNOSIS — I639 Cerebral infarction, unspecified: Secondary | ICD-10-CM | POA: Diagnosis not present

## 2023-11-30 DIAGNOSIS — R29705 NIHSS score 5: Secondary | ICD-10-CM

## 2023-11-30 DIAGNOSIS — I63512 Cerebral infarction due to unspecified occlusion or stenosis of left middle cerebral artery: Secondary | ICD-10-CM | POA: Diagnosis not present

## 2023-11-30 LAB — CBC
HCT: 40.9 % (ref 39.0–52.0)
Hemoglobin: 14 g/dL (ref 13.0–17.0)
MCH: 30.7 pg (ref 26.0–34.0)
MCHC: 34.2 g/dL (ref 30.0–36.0)
MCV: 89.7 fL (ref 80.0–100.0)
Platelets: 228 K/uL (ref 150–400)
RBC: 4.56 MIL/uL (ref 4.22–5.81)
RDW: 13.2 % (ref 11.5–15.5)
WBC: 7.1 K/uL (ref 4.0–10.5)
nRBC: 0 % (ref 0.0–0.2)

## 2023-11-30 LAB — BASIC METABOLIC PANEL WITH GFR
Anion gap: 9 (ref 5–15)
BUN: 35 mg/dL — ABNORMAL HIGH (ref 8–23)
CO2: 19 mmol/L — ABNORMAL LOW (ref 22–32)
Calcium: 8.4 mg/dL — ABNORMAL LOW (ref 8.9–10.3)
Chloride: 111 mmol/L (ref 98–111)
Creatinine, Ser: 3.06 mg/dL — ABNORMAL HIGH (ref 0.61–1.24)
GFR, Estimated: 19 mL/min — ABNORMAL LOW (ref >60)
Glucose, Bld: 142 mg/dL — ABNORMAL HIGH (ref 70–99)
Potassium: 4 mmol/L (ref 3.5–5.1)
Sodium: 139 mmol/L (ref 135–145)

## 2023-11-30 LAB — GLUCOSE, CAPILLARY
Glucose-Capillary: 171 mg/dL — ABNORMAL HIGH (ref 70–99)
Glucose-Capillary: 134 mg/dL — ABNORMAL HIGH (ref 70–99)
Glucose-Capillary: 179 mg/dL — ABNORMAL HIGH (ref 70–99)
Glucose-Capillary: 188 mg/dL — ABNORMAL HIGH (ref 70–99)

## 2023-11-30 LAB — PHOSPHORUS: Phosphorus: 2 mg/dL — ABNORMAL LOW (ref 2.5–4.6)

## 2023-11-30 LAB — VITAMIN D 25 HYDROXY (VIT D DEFICIENCY, FRACTURES): Vit D, 25-Hydroxy: 42.67 ng/mL (ref 30–100)

## 2023-11-30 LAB — MAGNESIUM: Magnesium: 2.2 mg/dL (ref 1.7–2.4)

## 2023-11-30 MED ORDER — BISOPROLOL FUMARATE 5 MG PO TABS
2.5000 mg | ORAL_TABLET | Freq: Every day | ORAL | Status: DC
  Administered 2023-12-01 – 2023-12-02 (×2): 2.5 mg via ORAL
  Filled 2023-11-30 (×2): qty 0.5

## 2023-11-30 MED ORDER — DONEPEZIL HCL 5 MG PO TABS
5.0000 mg | ORAL_TABLET | Freq: Every day | ORAL | Status: DC
  Administered 2023-11-30 – 2023-12-13 (×16): 5 mg via ORAL
  Filled 2023-11-30 (×14): qty 1

## 2023-11-30 MED ORDER — AMLODIPINE BESYLATE 5 MG PO TABS
5.0000 mg | ORAL_TABLET | Freq: Every day | ORAL | Status: DC
  Administered 2023-12-01 – 2023-12-02 (×2): 5 mg via ORAL
  Filled 2023-11-30 (×2): qty 1

## 2023-11-30 MED ORDER — SODIUM BICARBONATE 650 MG PO TABS
650.0000 mg | ORAL_TABLET | Freq: Three times a day (TID) | ORAL | Status: AC
  Administered 2023-11-30 (×2): 650 mg via ORAL
  Filled 2023-11-30 (×2): qty 1

## 2023-11-30 MED ORDER — HYDRALAZINE HCL 20 MG/ML IJ SOLN: 10.0000 mg | Freq: Three times a day (TID) | INTRAMUSCULAR | Status: DC | PRN

## 2023-11-30 MED ORDER — K PHOS MONO-SOD PHOS DI & MONO 155-852-130 MG PO TABS
500.0000 mg | ORAL_TABLET | Freq: Four times a day (QID) | ORAL | Status: AC
  Administered 2023-11-30 (×2): 500 mg via ORAL
  Filled 2023-11-30 (×2): qty 2
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
## 2023-11-30 NOTE — Plan of Care (Signed)
 Pt alert and orient to self. Patient impulsive  he's looking for his wife and daughter. Able to reorient and assist patient back to his room. Patient also getting up often to go to the bathroom and not voiding. Patient bladder scanned and made Dr. Cleatus aware.

## 2023-11-30 NOTE — Progress Notes (Addendum)
 NEUROLOGY CONSULT NOTE   Date of service: November 30, 2023 Patient Name: Victor Gould MRN:  [id number] DOB:  1940/06/16 Chief Complaint: acute ischemic stroke Requesting Provider: Von Bellis, MD  History of Present Illness  Victor Gould is a 83 y.o. male with hx of CAD s/p CABG, HTN, a fib on eliquis , CKDIII, DM2, hypothyroidism, f/o prostate cancer, HL,HF, h/o CVA, subdural hematoma, mild cognitive impairment, Parkinson's disease who presents with aphasia. Wife noticed on day of admission that he was having prominent word-finding difficulties. He was found to have acute infarct in the left insula and many small infarcts in the overlying parietal lobe (posterior L MCA territory). He also has numerous remote infarcts.  LKW: 2 days ago Modified rankin score: 2-Slight disability-UNABLE to perform all activities but does not need assistance IV Thrombolysis: no outside window  NIHSS components Score: Comment  1a Level of Conscious 0[x]  1[]  2[]  3[]      1b LOC Questions 0[]  1[]  2[x]       1c LOC Commands 0[]  1[x]  2[]       2 Best Gaze 0[x]  1[]  2[]       3 Visual 0[x]  1[]  2[]  3[]      4 Facial Palsy 0[x]  1[]  2[]  3[]      5a Motor Arm - left 0[x]  1[]  2[]  3[]  4[]  UN[]    5b Motor Arm - Right 0[x]  1[]  2[]  3[]  4[]  UN[]    6a Motor Leg - Left 0[x]  1[]  2[]  3[]  4[]  UN[]    6b Motor Leg - Right 0[x]  1[]  2[]  3[]  4[]  UN[]    7 Limb Ataxia 0[x]  1[]  2[]  UN[]      8 Sensory 0[x]  1[]  2[]  UN[]      9 Best Language 0[]  1[]  2[x]  3[]      10 Dysarthria 0[x]  1[]  2[]  UN[]      11 Extinct. and Inattention 0[x]  1[]  2[]       TOTAL:  NIHSS = 5      ROS  Unable to ascertain due to AMS  Past History   Past Medical History:  Diagnosis Date   ASCVD (arteriosclerotic cardiovascular disease)    Cancer (HCC)    prostate   Chronic kidney disease    Colon polyps    Constipation    Coronary artery disease    Diabetes mellitus    Family history of adverse reaction to anesthesia    children - PONV   GERD  (gastroesophageal reflux disease)    Hemorrhoids    Hyperlipidemia    Hypertension    PCP Dr Oneil Pinal at [place]   Hypothyroidism    MI (myocardial infarction) (HCC)    PONV (postoperative nausea and vomiting)    Thyroid disease    Vertigo    positional    Past Surgical History:  Procedure Laterality Date   APPENDECTOMY     CATARACT EXTRACTION W/PHACO Right 10/27/2018   Procedure: CATARACT EXTRACTION PHACO AND INTRAOCULAR LENS PLACEMENT (IOC) RIGHT, DIABETIC;  Surgeon: Jaye Elsie, MD;  Location: [facility] SURGERY [facility];  Service: Ophthalmology;  Laterality: Right;  diabetic - insulin    CATARACT EXTRACTION W/PHACO Left 11/25/2018   Procedure: CATARACT EXTRACTION PHACO AND INTRAOCULAR LENS PLACEMENT (IOC) LEFT DIABETIC;  Surgeon: Jaye Elsie, MD;  Location: [facility] SUR[facility]ERY [facility];  Service: Ophthalmology;  Laterality: Left;   COLONOSCOPY     colonoscopy with polypectomy     COLONOSCOPY WITH PROPOFOL  N/A 12/02/2017   Procedure: COLONOSCOPY WITH PROPOFOL ;  Surgeon: Viktoria Lamar DASEN, MD;  Location: [facility] ENDOSCOPY;  Service: Endoscopy;  Laterality: N/A;  CORONARY ARTERY BYPASS GRAFT N/A 08/28/2016   Procedure: CORONARY ARTERY BYPASS GRAFTING (CABG) x five, using left internal mammary artery and right leg greater saphenous vein harvested endoscopically - -LIMA to LAD, -SVG to OM, -SVG to DIAGONAL, -SEQ SVG to PDA, PLVB ;  Surgeon: Dallas KATHEE Jude, MD;  Location: [facility] OR;  Service: Open Heart Surgery;  Laterality: N/A;   CYSTOSCOPY N/A 09/29/2013   Procedure: CYSTOSCOPY;  Surgeon: Glendia DELENA Elizabeth, MD;  Location: [facility] ORS;  Service: Urology;  Laterality: N/A;   ESOPHAGOGASTRODUODENOSCOPY (EGD) WITH PROPOFOL  N/A 12/02/2017   Procedure: ESOPHAGOGASTRODUODENOSCOPY (EGD) WITH PROPOFOL ;  Surgeon: Viktoria Lamar DASEN, MD;  Location: [facility] ENDOSCOPY;  Service: Endoscopy;  Laterality: N/A;   HERNIA REPAIR     inguinal bilaterally   LEFT HEART CATH AND CORONARY ANGIOGRAPHY N/A 08/27/2016   Procedure:  Left Heart Cath and Coronary Angiography;  Surgeon: Victory LELON Sharps, MD;  Location: [facility] INVASIVE CV LAB;  Service: Cardiovascular;  Laterality: N/A;   ROBOT ASSISTED LAPAROSCOPIC RADICAL PROSTATECTOMY  06/25/2011   Procedure: ROBOTIC ASSISTED LAPAROSCOPIC RADICAL PROSTATECTOMY LEVEL 2;  Surgeon: Noretta Ferrara, MD;  Location: [facility] ORS;  Service: Urology;  Laterality: N/A;   TEE WITHOUT CARDIOVERSION N/A 08/28/2016   Procedure: TRANSESOPHAGEAL ECHOCARDIOGRAM (TEE);  Surgeon: Dallas KATHEE Jude, MD;  Location: [facility] OR;  Service: Open Heart Surgery;  Laterality: N/A;   UPPER GASTROINTESTINAL ENDOSCOPY     URETHRAL SLING N/A 09/29/2013   Procedure: MALE SLING;  Surgeon: Glendia DELENA Elizabeth, MD;  Location: [facility] ORS;  Service: Urology;  Laterality: N/A;    Family History: Family History  Problem Relation Age of Onset   Heart attack Mother 63   Heart attack Brother     Social History  reports that he has never smoked. He has never used smokeless tobacco. He reports that he does not drink alcohol and does not use drugs.  Allergies  Allergen Reactions   Naproxen Sodium Shortness Of Breath   Other Hives   Oxycodone  Hcl Nausea And Vomiting   Sulfa Antibiotics Rash and Hives   Trazodone Other (See Comments)    Hallucinations   Tramadol Other (See Comments)    dizziness   Carvedilol  Rash    Medications   Current Facility-Administered Medications:    acetaminophen  (TYLENOL ) tablet 650 mg, 650 mg, Oral, Q6H PRN **OR** acetaminophen  (TYLENOL ) suppository 650 mg, 650 mg, Rectal, Q6H PRN, Lenon Marien CROME, MD   albuterol  (PROVENTIL ) (2.5 MG/3ML) 0.083% nebulizer solution 2.5 mg, 2.5 mg, Nebulization, Q2H PRN, Lenon Marien CROME, MD   apixaban  (ELIQUIS ) tablet 2.5 mg, 2.5 mg, Oral, BID, Lenon Marien L, MD, 2.5 mg at 11/29/23 2120   atorvastatin  (LIPITOR ) tablet 40 mg, 40 mg, Oral, Daily, Kumar, Navneet, MD, 40 mg at 11/29/23 1638   bisacodyl  (DULCOLAX) EC tablet 5 mg, 5 mg, Oral, Daily PRN, Lenon Marien CROME, MD   carbidopa -levodopa  (SINEMET  IR) 25-100 MG per tablet immediate release 1 tablet, 1 tablet, Oral, TID, Lenon Marien CROME, MD, 1 tablet at 11/29/23 2120   clopidogrel  (PLAVIX ) tablet 75 mg, 75 mg, Oral, Daily, Lenon Marien CROME, MD, 75 mg at 11/29/23 9163   hydrALAZINE  (APRESOLINE ) injection 2 mg, 2 mg, Intravenous, Q8H PRN, Lenon Marien CROME, MD, 2 mg at 11/29/23 0034   insulin  aspart (novoLOG ) injection 0-9 Units, 0-9 Units, Subcutaneous, TID WC, Lenon Marien CROME, MD, 2 Units at 11/29/23 1642   levothyroxine  (SYNTHROID ) tablet 75 mcg, 75 mcg, Oral, Q0600, Lenon Marien CROME, MD, 75 mcg at 11/30/23 0600  ondansetron  (ZOFRAN ) tablet 4 mg, 4 mg, Oral, Q6H PRN **OR** ondansetron  (ZOFRAN ) injection 4 mg, 4 mg, Intravenous, Q6H PRN, Lenon Pons L, MD   polyethylene glycol (MIRALAX  / GLYCOLAX ) packet 17 g, 17 g, Oral, Daily, Lenon Pons L, MD, 17 g at 11/29/23 0836   sodium chloride  flush (NS) 0.9 % injection 3 mL, 3 mL, Intravenous, Once, Claudene Rover, MD  Vitals   Vitals:   11/30/23 0020 11/30/23 0054 11/30/23 0120 11/30/23 0520  BP: (!) 208/91 (!) 188/84 (!) 190/82 (!) 168/72  Pulse: 83  72 74  Resp: 16  16 16   Temp: 98 F (36.7 C)  98 F (36.7 C) 98 F (36.7 C)  TempSrc: Oral  Oral Oral  SpO2: 98%   95%  Weight:        Body mass index is 23.64 kg/m.   Physical Exam   Exam  Vitals:   11/30/23 0120 11/30/23 0520  BP: (!) 190/82 (!) 168/72  Pulse: 72 74  Resp: 16 16  Temp: 98 F (36.7 C) 98 F (36.7 C)  SpO2:  95%    Gen: patient lying in bed, NAD CV: extremities appear well-perfused Resp: normal WOB  Neurologic exam MS: alert, oriented to self, follows some simple commands Speech: no dysarthria, moderate aphasia CN: PERRL, VFF, EOMI, sensation intact, face symmetric, hearing intact to voice Motor: 5/5 strength throughout Sensory: SILT Reflexes: 2+ symm with toes down bilat Coordination: FNF intact bilat Gait:  deferred   Labs/Imaging/Neurodiagnostic studies   CBC:  Recent Labs  Lab 2023-12-20 1846 11/29/23 0521  WBC 6.8 7.8  NEUTROABS 4.5  --   HGB 13.1 13.6  HCT 40.8 39.7  MCV 93.2 89.4  PLT 235 230   Basic Metabolic Panel:  Lab Results  Component Value Date   NA 140 11/29/2023   K 3.6 11/29/2023   CO2 22 11/29/2023   GLUCOSE 103 (H) 11/29/2023   BUN 35 (H) 11/29/2023   CREATININE 3.21 (H) 11/29/2023   CALCIUM  8.2 (L) 11/29/2023   GFRNONAA 18 (L) 11/29/2023   GFRAA 32 (L) 05/28/2019   Lipid Panel:  Lab Results  Component Value Date   LDLCALC 103 (H) 08/26/2016   HgbA1c:  Lab Results  Component Value Date   HGBA1C 7.4 (H) 2023-12-20   Urine Drug Screen:     Component Value Date/Time   LABOPIA NONE DETECTED 01/18/2021 1349   COCAINSCRNUR NONE DETECTED 01/18/2021 1349   LABBENZ NONE DETECTED 01/18/2021 1349   AMPHETMU NONE DETECTED 01/18/2021 1349   THCU NONE DETECTED 01/18/2021 1349   LABBARB NONE DETECTED 01/18/2021 1349    Alcohol Level     Component Value Date/Time   ETH <15 2023/12/20 1846   INR  Lab Results  Component Value Date   INR 1.2 12/20/23   APTT  Lab Results  Component Value Date   APTT 36 12/20/2023   AED levels: No results found for: PHENYTOIN, ZONISAMIDE, LAMOTRIGINE, LEVETIRACETA  Vascular imaging, TTE pending  MRI Brain(Personally reviewed): 1. Small acute infarct in the left insula and many small acute infarcts in the overlying left parietal lobe (posterior left MCA territory). 2. Many remote infarcts, as detailed above. 3. Chronic right vertebral artery occlusion.   ASSESSMENT   Victor Gould is a 83 y.o. male Victor Gould is a 83 y.o. male with hx of CAD s/p CABG, HTN, a fib on eliquis , CKDIII, DM2, hypothyroidism, f/o prostate cancer, HL,HF, h/o CVA, subdural hematoma, mild cognitive impairment, Parkinson's disease who presents  with aphasia 2/2 L MCA infarct.  RECOMMENDATIONS   - Admit for stroke  workup - Permissive HTN x48 hrs from sx onset goal BP <220/110. PRN labetalol  or hydralazine  if BP above these parameters. Avoid oral antihypertensives. - MRA head wo contrast and carotid US  - TTE pending - OK to continue eliquis  and plavix  2/2 small size of infarcts. Plavix  is prescribed for CAD - q4 hr neuro checks - STAT head CT for any change in neuro exam - Tele - PT/OT/SLP - Stroke education - Amb referral to neurology upon discharge   ______________________________________________________________________    Signed, Elida CHRISTELLA Ross, MD [facility]

## 2023-11-30 NOTE — Plan of Care (Signed)
 Pt is alert and oriented with some forgetfulness. Pt denies any c/o pain. Wife at bedside visiting. Blood glucose levels stable. Bed is low, locked and call light in reach for safety.  Problem: Education: Goal: Ability to describe self-care measures that may prevent or decrease complications (Diabetes Survival Skills Education) will improve Outcome: Progressing Goal: Individualized Educational Video(s) Outcome: Progressing   Problem: Coping: Goal: Ability to adjust to condition or change in health will improve Outcome: Progressing   Problem: Fluid Volume: Goal: Ability to maintain a balanced intake and output will improve Outcome: Progressing   Problem: Health Behavior/Discharge Planning: Goal: Ability to identify and utilize available resources and services will improve Outcome: Progressing Goal: Ability to manage health-related needs will improve Outcome: Progressing   Problem: Metabolic: Goal: Ability to maintain appropriate glucose levels will improve Outcome: Progressing   Problem: Nutritional: Goal: Maintenance of adequate nutrition will improve Outcome: Progressing Goal: Progress toward achieving an optimal weight will improve Outcome: Progressing   Problem: Skin Integrity: Goal: Risk for impaired skin integrity will decrease Outcome: Progressing   Problem: Tissue Perfusion: Goal: Adequacy of tissue perfusion will improve Outcome: Progressing   Problem: Education: Goal: Knowledge of General Education information will improve Description: Including pain rating scale, medication(s)/side effects and non-pharmacologic comfort measures Outcome: Progressing   Problem: Health Behavior/Discharge Planning: Goal: Ability to manage health-related needs will improve Outcome: Progressing   Problem: Clinical Measurements: Goal: Ability to maintain clinical measurements within normal limits will improve Outcome: Progressing Goal: Will remain free from infection Outcome:  Progressing Goal: Diagnostic test results will improve Outcome: Progressing Goal: Respiratory complications will improve Outcome: Progressing Goal: Cardiovascular complication will be avoided Outcome: Progressing   Problem: Activity: Goal: Risk for activity intolerance will decrease Outcome: Progressing   Problem: Nutrition: Goal: Adequate nutrition will be maintained Outcome: Progressing   Problem: Coping: Goal: Level of anxiety will decrease Outcome: Progressing   Problem: Elimination: Goal: Will not experience complications related to bowel motility Outcome: Progressing Goal: Will not experience complications related to urinary retention Outcome: Progressing   Problem: Pain Managment: Goal: General experience of comfort will improve and/or be controlled Outcome: Progressing   Problem: Safety: Goal: Ability to remain free from injury will improve Outcome: Progressing   Problem: Skin Integrity: Goal: Risk for impaired skin integrity will decrease Outcome: Progressing

## 2023-11-30 NOTE — Progress Notes (Signed)
 [facility] Progress Note  Patient: Victor Gould    FMW:[id number]  DOA: 11/28/2023     Date of Service: the patient was seen and examined on 11/30/2023  Chief Complaint  Patient presents with   Altered Mental Status   Brief hospital course: Victor Gould is a 83 y.o. male with a PMH significant for CAD s/p CABG, HTN, afib, CKDIII, Type II DM, hypothyroidism, h/o prostate cancer, HLD, HF, h/o CVA, subdural hematoma (3 years prior), parkinson's disease. At baseline, they live with their wife and are independent with ADLs.   They presented from home to the ED on 11/28/2023 with confusion x1 days. Wife states that he has been using inappropriate words and actions throughout the day. He did not have any injury and he denies having any symptoms. He denies any focalized weakness or speech abnormalities. He endorses good adherence with his medications which includes aspirin , plavix , and eliquis .    ED w/up: VS significantly elevated blood pressure up to 216/84, mild bradycardia in high 50s. Otherwise stable.  Labs unremarkable CBC, glucose 252, Cr 3.43 ( roughly baseline) Brain MRI: 1. Small acute infarct in the left insula and many small acute infarcts in the overlying left parietal lobe (posterior left MCA territory). 2. Many remote infarcts, as detailed above. ( As shown in CT) 3. Chronic right vertebral artery occlusion.   They were initially treated with nothing.    Patient was admitted to medicine service for further workup and management of acute CVA as outlined in detail below.    Assessment and Plan:  # Acute CVA Patient presented with aphasia, AMS, no focal deficits MRI brain positive Neurology consulted, recommended to continue Eliquis  and Plavix  for CAD PT/OT/SLP eval done, recommended home health PT and speech therapy. Carotid duplex negative for any significant stenosis TTE pending   # AKI on CKD stage IV Baseline creatinine 2.64 8/2 creatinine 3.43>>3.06  elevated Encourage oral hydration Bladder scan negative for retention Monitor urine output and BMP daily  # Metabolic acidosis, CO2 19, started oral bicarbonate.  # Hypophosphatemia, Phos repleted. Monitor and replete as needed.  # HTN- severely elevated Allow permissive hypertension for 48 hours Resume antihypertensive medications tomorrow with holding parameters Monitor BP and titrate medications accordingly  # Type II DM - sSSI - A1c pending     # Parkinsons - continue home sinemet    # Hypothyroidism - continue home synthroid    Body mass index is 23.64 kg/m.  Interventions:  Diet: Regular diet DVT Prophylaxis: Eliquis   Advance goals of care discussion: Full code  Family Communication: family was present at bedside, at the time of interview.  The pt provided permission to discuss medical plan with the family. Opportunity was given to ask question and all questions were answered satisfactorily.   Disposition:  Pt is from Home, admitted with Acute CVA, still workup pending, needs TEE, which precludes a safe discharge. Discharge to home with home health services, when TTE will be done, most likely discharge tomorrow a.m.  Subjective: No significant events overnight, patient denies any focal deficits.  Denied any headache or dizziness, no nasal complaints Patient is AO x 1, he has significant dementia  Physical Exam: General: NAD, lying comfortably Appear in no distress, affect appropriate Eyes: PERRLA ENT: Oral Mucosa Clear, moist  Neck: no JVD,  Cardiovascular: S1 and S2 Present, no Murmur,  Respiratory: good respiratory effort, Bilateral Air entry equal and Decreased, no Crackles, no wheezes Abdomen: Bowel Sound present, Soft and no tenderness,  Skin: no rashes Extremities: no Pedal edema, no calf tenderness Neurologic: without any new focal findings Gait not checked due to patient safety concerns  Vitals:   11/30/23 0120 11/30/23 0520 11/30/23 0810  11/30/23 1210  BP: (!) 190/82 (!) 168/72 (!) 172/75 (!) 197/85  Pulse: 72 74 66 61  Resp: 16 16 16 16   Temp: 98 F (36.7 C) 98 F (36.7 C) 97.9 F (36.6 C) 97.7 F (36.5 C)  TempSrc: Oral Oral  Oral  SpO2:  95% 97%   Weight:        Intake/Output Summary (Last 24 hours) at 11/30/2023 1548 Last data filed at 11/30/2023 1037 Gross per 24 hour  Intake 0 ml  Output --  Net 0 ml   Filed Weights   11/28/23 1836  Weight: 72.6 kg    Data Reviewed: I have personally reviewed and interpreted daily labs, tele strips, imagings as discussed above. I reviewed all nursing notes, pharmacy notes, vitals, pertinent old records I have discussed plan of care as described above with RN and patient/family.  CBC: Recent Labs  Lab 11/28/23 1846 11/29/23 0521 11/30/23 1218  WBC 6.8 7.8 7.1  NEUTROABS 4.5  --   --   HGB 13.1 13.6 14.0  HCT 40.8 39.7 40.9  MCV 93.2 89.4 89.7  PLT 235 230 228   Basic Metabolic Panel: Recent Labs  Lab 11/28/23 1846 11/29/23 0521 11/30/23 1218  NA 138 140 139  K 4.5 3.6 4.0  CL 110 111 111  CO2 21* 22 19*  GLUCOSE 252* 103* 142*  BUN 35* 35* 35*  CREATININE 3.43* 3.21* 3.06*  CALCIUM  8.4* 8.2* 8.4*  MG  --   --  2.2  PHOS  --   --  2.0*    Studies: US  Carotid Bilateral Result Date: 11/29/2023 CLINICAL DATA:  Stroke EXAM: BILATERAL CAROTID DUPLEX ULTRASOUND TECHNIQUE: Elnor scale imaging, color Doppler and duplex ultrasound were performed of bilateral carotid and vertebral arteries in the neck. COMPARISON:  None Available. FINDINGS: Criteria: Quantification of carotid stenosis is based on velocity parameters that correlate the residual internal carotid diameter with NASCET-based stenosis levels, using the diameter of the distal internal carotid lumen as the denominator for stenosis measurement. The following velocity measurements were obtained: RIGHT ICA: 113 cm/sec CCA: 85.7 cm/sec SYSTOLIC ICA/CCA RATIO:  1.3 ECA: 105 cm/sec LEFT ICA: 81 cm/sec CCA: 91.8  cm/sec SYSTOLIC ICA/CCA RATIO:  0.9 ECA: 171 cm/sec RIGHT CAROTID ARTERY: There is some partially calcified plaque particularly along the carotid bulb and proximal ICA region. Antegrade flow along the internal external carotid artery. RIGHT VERTEBRAL ARTERY:  Antegrade flow LEFT CAROTID ARTERY: There is some mild partially calcified plaque along the distal common carotid artery. Antegrade flow along the ICA and ECA. LEFT VERTEBRAL ARTERY:  Antegrade flow IMPRESSION: Scattered bilateral partially calcified plaque, more right than left. No hemodynamic significant stenosis of either ICA. Electronically Signed   By: Ranell Bring M.D.   On: 11/29/2023 16:48    Scheduled Meds:  [START ON 12/01/2023] amLODipine   5 mg Oral Daily   apixaban   2.5 mg Oral BID   atorvastatin   40 mg Oral Daily   [START ON 12/01/2023] bisoprolol   2.5 mg Oral Daily   carbidopa -levodopa   1 tablet Oral TID   clopidogrel   75 mg Oral Daily   donepezil   5 mg Oral QHS   insulin  aspart  0-9 Units Subcutaneous TID WC   levothyroxine   75 mcg Oral Q0600   polyethylene glycol  17  g Oral Daily   sodium chloride  flush  3 mL Intravenous Once   Continuous Infusions: PRN Meds: acetaminophen  **OR** acetaminophen , albuterol , bisacodyl , hydrALAZINE , ondansetron  **OR** ondansetron  (ZOFRAN ) IV  Time spent: 42 minutes  Author: ELVAN SOR. MD [facility] 11/30/2023 3:48 PM  To reach On-call, see care teams to locate the attending and reach out to them via [email or web address]. If 7PM-7AM, please contact night-coverage If you still have difficulty reaching the attending provider, please page the [facility] (Director on Call) for [facility] on amion for assistance.

## 2023-12-01 ENCOUNTER — Inpatient Hospital Stay
Admit: 2023-12-01 | Discharge: 2023-12-01 | Disposition: A | Discharge status: Home / Self Care | Attending: Student in an Organized Health Care Education/Training Program

## 2023-12-01 DIAGNOSIS — I639 Cerebral infarction, unspecified: Secondary | ICD-10-CM | POA: Diagnosis not present

## 2023-12-01 LAB — CBC
HCT: 41.8 % (ref 39.0–52.0)
Hemoglobin: 14.4 g/dL (ref 13.0–17.0)
MCH: 30.8 pg (ref 26.0–34.0)
MCHC: 34.4 g/dL (ref 30.0–36.0)
MCV: 89.3 fL (ref 80.0–100.0)
Platelets: 227 K/uL (ref 150–400)
RBC: 4.68 MIL/uL (ref 4.22–5.81)
RDW: 13.3 % (ref 11.5–15.5)
WBC: 7.6 K/uL (ref 4.0–10.5)
nRBC: 0 % (ref 0.0–0.2)

## 2023-12-01 LAB — BASIC METABOLIC PANEL WITH GFR
Anion gap: 11 (ref 5–15)
BUN: 38 mg/dL — ABNORMAL HIGH (ref 8–23)
CO2: 20 mmol/L — ABNORMAL LOW (ref 22–32)
Calcium: 8.2 mg/dL — ABNORMAL LOW (ref 8.9–10.3)
Chloride: 108 mmol/L (ref 98–111)
Creatinine, Ser: 3.17 mg/dL — ABNORMAL HIGH (ref 0.61–1.24)
GFR, Estimated: 18 mL/min — ABNORMAL LOW (ref >60)
Glucose, Bld: 176 mg/dL — ABNORMAL HIGH (ref 70–99)
Potassium: 3.8 mmol/L (ref 3.5–5.1)
Sodium: 139 mmol/L (ref 135–145)

## 2023-12-01 LAB — GLUCOSE, CAPILLARY
Glucose-Capillary: 170 mg/dL — ABNORMAL HIGH (ref 70–99)
Glucose-Capillary: 198 mg/dL — ABNORMAL HIGH (ref 70–99)
Glucose-Capillary: 379 mg/dL — ABNORMAL HIGH (ref 70–99)
Glucose-Capillary: 94 mg/dL (ref 70–99)
Glucose-Capillary: 136 mg/dL — ABNORMAL HIGH (ref 70–99)

## 2023-12-01 LAB — ECHOCARDIOGRAM COMPLETE
AR max vel: 2.09 cm2
AV Peak grad: 7.1 mmHg
Ao pk vel: 1.33 m/s
Area-P 1/2: 3.01 cm2
S' Lateral: 3.6 cm
Single Plane A4C EF: 56.5 %
Weight: 2560.86 [oz_av]

## 2023-12-01 LAB — MAGNESIUM: Magnesium: 2.1 mg/dL (ref 1.7–2.4)

## 2023-12-01 LAB — PHOSPHORUS: Phosphorus: 4.5 mg/dL (ref 2.5–4.6)

## 2023-12-01 MED ORDER — PERFLUTREN LIPID MICROSPHERE
1.0000 mL | INTRAVENOUS | Status: AC | PRN
  Administered 2023-12-01: 5 mL via INTRAVENOUS

## 2023-12-01 MED ORDER — SODIUM CHLORIDE 0.9 % IV SOLN: INTRAVENOUS | Status: AC

## 2023-12-01 MED ORDER — HYDRALAZINE HCL 50 MG PO TABS
50.0000 mg | ORAL_TABLET | Freq: Four times a day (QID) | ORAL | Status: DC | PRN
  Administered 2023-12-01 – 2023-12-14 (×9): 50 mg via ORAL
  Filled 2023-12-01 (×8): qty 1

## 2023-12-01 MED ORDER — HYDRALAZINE HCL 20 MG/ML IJ SOLN
10.0000 mg | Freq: Three times a day (TID) | INTRAMUSCULAR | Status: DC | PRN
  Administered 2023-12-01 – 2023-12-06 (×5): 10 mg via INTRAVENOUS
  Filled 2023-12-01 (×5): qty 1

## 2023-12-01 MED ORDER — QUETIAPINE FUMARATE 25 MG PO TABS
25.0000 mg | ORAL_TABLET | Freq: Every day | ORAL | Status: DC
  Administered 2023-12-01 – 2023-12-08 (×7): 25 mg via ORAL
  Filled 2023-12-01 (×8): qty 1

## 2023-12-01 NOTE — Progress Notes (Signed)
 Mobility Specialist - Progress Note     12/01/23 1500  Mobility  Activity Ambulated with assistance;Stood at bedside;Dangled on edge of bed  Level of Assistance Contact guard assist, steadying assist  Assistive Device Front wheel walker  Distance Ambulated (ft) 160 ft  Range of Motion/Exercises Active  Activity Response Tolerated well  Mobility Referral Yes  Mobility visit 1 Mobility  Mobility Specialist Start Time (ACUTE ONLY) 1530  Mobility Specialist Stop Time (ACUTE ONLY) 1544  Mobility Specialist Time Calculation (min) (ACUTE ONLY) 14 min   Pt resting in bed on RA upon entry. Pt pleasant and agreeable to participate. Pt STS x2 and ambulates to hallway around NS CGA with RW. Pt required constant verbal and manual directional cuing throughtout the session and easily distracted. Pt slurred speech is not new and RN is aware. Pt returned to bed and left with needs in reach. Bed alarm activated.   Guido Rumble Mobility Specialist 12/01/23, 3:51 PM

## 2023-12-01 NOTE — Progress Notes (Signed)
 [facility] Progress Note  Patient: Victor Gould    FMW:[id number]  DOA: 11/28/2023     Date of Service: the patient was seen and examined on 12/01/2023  Chief Complaint  Patient presents with   Altered Mental Status   Brief hospital course: Victor Gould is a 83 y.o. male with a PMH significant for CAD s/p CABG, HTN, afib, CKDIII, Type II DM, hypothyroidism, h/o prostate cancer, HLD, HF, h/o CVA, subdural hematoma (3 years prior), parkinson's disease. At baseline, they live with their wife and are independent with ADLs.   They presented from home to the ED on 11/28/2023 with confusion x1 days. Wife states that he has been using inappropriate words and actions throughout the day. He did not have any injury and he denies having any symptoms. He denies any focalized weakness or speech abnormalities. He endorses good adherence with his medications which includes aspirin , plavix , and eliquis .    ED w/up: VS significantly elevated blood pressure up to 216/84, mild bradycardia in high 50s. Otherwise stable.  Labs unremarkable CBC, glucose 252, Cr 3.43 ( roughly baseline) Brain MRI: 1. Small acute infarct in the left insula and many small acute infarcts in the overlying left parietal lobe (posterior left MCA territory). 2. Many remote infarcts, as detailed above. ( As shown in CT) 3. Chronic right vertebral artery occlusion.   They were initially treated with nothing.    Patient was admitted to medicine service for further workup and management of acute CVA as outlined in detail below.    Assessment and Plan:  # Acute CVA Patient presented with aphasia, AMS, no focal deficits MRI brain positive Neurology consulted, recommended to continue Eliquis  and Plavix  for CAD PT/OT/SLP eval done, recommended home health PT and speech therapy. Carotid duplex negative for any significant stenosis TTE LVEF 50 to 55%, grade 1 diastolic dysfunction, no WMA, negative PFO.   # AKI on CKD  stage IV Baseline sCr 2.64 which was 2 year ago  8/3 creatinine 3.43>>3.06>>3.17 elevated Encourage oral hydration Bladder scan negative for retention Monitor urine output and BMP daily 8/3 started IV fluid for gentle hydration  # Metabolic acidosis, CO2 19, started oral bicarbonate.  # Hypophosphatemia, Phos repleted. Monitor and replete as needed.  # HTN- severely elevated S/p permissive hypertension for 48 hours -Amlodipine  5 mg p.o. daily -Bisoprolol  2.5 mg p.o. daily - Hydralazine  IV vs po prn Monitor BP and titrate medications accordingly  # Type II DM - sSSI - A1c 7.4 well-controlled     # Parkinsons - continue home sinemet    # Hypothyroidism - continue home synthroid    Body mass index is 23.64 kg/m.  Interventions:  Diet: Regular diet DVT Prophylaxis: Eliquis   Advance goals of care discussion: Full code  Family Communication: family was present at bedside, at the time of interview.  The pt provided permission to discuss medical plan with the family. Opportunity was given to ask question and all questions were answered satisfactorily.   Disposition:  Pt is from Home, admitted with Acute CVA, and AKI, on IVF,  which precludes a safe discharge. Discharge to home with home health services, most likely discharge tomorrow a.m.  Subjective: No significant events overnight, patient denies any focal deficits.  Denied any headache or dizziness.  Did not offer any complaints. Patient is AO x 1, he has significant dementia  Physical Exam: General: NAD, lying comfortably Appear in no distress, affect appropriate Eyes: PERRLA ENT: Oral Mucosa Clear, moist  Neck: no JVD,  Cardiovascular: S1 and S2 Present, no Murmur,  Respiratory: good respiratory effort, Bilateral Air entry equal and Decreased, no Crackles, no wheezes Abdomen: Bowel Sound present, Soft and no tenderness,  Skin: no rashes Extremities: no Pedal edema, no calf tenderness Neurologic: without any new  focal findings Gait not checked due to patient safety concerns  Vitals:   12/01/23 0409 12/01/23 0832 12/01/23 1157 12/01/23 1554  BP: (!) 180/90 (!) 177/92 (!) 199/87 (!) 181/76  Pulse: 71 65 72 72  Resp:  18 18 16   Temp: 98.2 F (36.8 C) 98.4 F (36.9 C) 98.5 F (36.9 C) 97.6 F (36.4 C)  TempSrc: Oral     SpO2: 95% 99% 97% 99%  Weight:        Intake/Output Summary (Last 24 hours) at 12/01/2023 1554 Last data filed at 12/01/2023 0850 Gross per 24 hour  Intake 480 ml  Output --  Net 480 ml   Filed Weights   11/28/23 1836  Weight: 72.6 kg    Data Reviewed: I have personally reviewed and interpreted daily labs, tele strips, imagings as discussed above. I reviewed all nursing notes, pharmacy notes, vitals, pertinent old records I have discussed plan of care as described above with RN and patient/family.  CBC: Recent Labs  Lab 11/28/23 1846 11/29/23 0521 11/30/23 1218 12/01/23 0455  WBC 6.8 7.8 7.1 7.6  NEUTROABS 4.5  --   --   --   HGB 13.1 13.6 14.0 14.4  HCT 40.8 39.7 40.9 41.8  MCV 93.2 89.4 89.7 89.3  PLT 235 230 228 227   Basic Metabolic Panel: Recent Labs  Lab 11/28/23 1846 11/29/23 0521 11/30/23 1218 12/01/23 0455  NA 138 140 139 139  K 4.5 3.6 4.0 3.8  CL 110 111 111 108  CO2 21* 22 19* 20*  GLUCOSE 252* 103* 142* 176*  BUN 35* 35* 35* 38*  CREATININE 3.43* 3.21* 3.06* 3.17*  CALCIUM  8.4* 8.2* 8.4* 8.2*  MG  --   --  2.2 2.1  PHOS  --   --  2.0* 4.5    Studies: ECHOCARDIOGRAM COMPLETE Result Date: 12/01/2023    ECHOCARDIOGRAM REPORT   Patient Name:   Victor Gould [facility] Date of Exam: 12/01/2023 Medical Rec #:  [id number]         Height:       69.0 in Accession #:    [id number]        Weight:       160.1 lb Date of Birth:  Nov 27, 1940          BSA:          1.879 m Patient Age:    83 years          BP:           180/90 mmHg Patient Gender: M                 HR:           71 bpm. Exam Location:  [facility] Procedure: 2D Echo, Cardiac Doppler, Color Doppler and  Intracardiac            Opacification Agent (Both Spectral and Color Flow Doppler were            utilized during procedure). Indications:     Stroke I63.9  History:         Patient has no prior history of Echocardiogram examinations.  Sonographer:     Thedora Louder RDCS, FASE Referring Phys:  [id number] CHELSEY  L ANDERSON Diagnosing Phys: Cara JONETTA Lovelace MD  Sonographer Comments: Technically difficult study due to poor echo windows, suboptimal apical window and no subcostal window. Image acquisition challenging due to patient body habitus. IMPRESSIONS  1. Left ventricular ejection fraction, by estimation, is 50 to 55%. The left ventricle has low normal function. The left ventricle has no regional wall motion abnormalities. Left ventricular diastolic parameters are consistent with Grade I diastolic dysfunction (impaired relaxation).  2. Right ventricular systolic function is normal. The right ventricular size is normal.  3. The mitral valve is normal in structure. Trivial mitral valve regurgitation.  4. The aortic valve is normal in structure. Aortic valve regurgitation is not visualized. FINDINGS  Left Ventricle: Left ventricular ejection fraction, by estimation, is 50 to 55%. The left ventricle has low normal function. The left ventricle has no regional wall motion abnormalities. Strain was performed and the global longitudinal strain is indeterminate. The left ventricular internal cavity size was normal in size. There is borderline left ventricular hypertrophy. Left ventricular diastolic parameters are consistent with Grade I diastolic dysfunction (impaired relaxation). Right Ventricle: The right ventricular size is normal. No increase in right ventricular wall thickness. Right ventricular systolic function is normal. Left Atrium: Left atrial size was normal in size. Right Atrium: Right atrial size was normal in size. Pericardium: There is no evidence of pericardial effusion. Mitral Valve: The mitral valve is  normal in structure. Trivial mitral valve regurgitation. Tricuspid Valve: The tricuspid valve is normal in structure. Tricuspid valve regurgitation is trivial. Aortic Valve: The aortic valve is normal in structure. Aortic valve regurgitation is not visualized. Aortic valve peak gradient measures 7.1 mmHg. Pulmonic Valve: The pulmonic valve was normal in structure. Pulmonic valve regurgitation is not visualized. Aorta: The ascending aorta was not well visualized. IAS/Shunts: No atrial level shunt detected by color flow Doppler. Additional Comments: 3D was performed not requiring image post processing on an independent workstation and was indeterminate.  LEFT VENTRICLE PLAX 2D LVIDd:         5.00 cm     Diastology LVIDs:         3.60 cm     LV e' medial:    6.09 cm/s LV PW:         1.00 cm     LV E/e' medial:  9.0 LV IVS:        1.15 cm     LV e' lateral:   8.59 cm/s LVOT diam:     2.00 cm     LV E/e' lateral: 6.4 LV SV:         57 LV SV Index:   30 LVOT Area:     3.14 cm  LV Volumes (MOD) LV vol d, MOD A4C: 90.5 ml LV vol s, MOD A4C: 39.4 ml LV SV MOD A4C:     90.5 ml RIGHT VENTRICLE RV Basal diam:  3.00 cm RV S prime:     9.79 cm/s TAPSE (M-mode): 1.4 cm LEFT ATRIUM           Index        RIGHT ATRIUM           Index LA diam:      4.00 cm 2.13 cm/m   RA Area:     10.90 cm LA Vol (A2C): 23.3 ml 12.40 ml/m  RA Volume:   22.70 ml  12.08 ml/m LA Vol (A4C): 66.8 ml 35.54 ml/m  AORTIC VALVE AV Area (Vmax): 2.09 cm AV Vmax:  133.00 cm/s AV Peak Grad:   7.1 mmHg LVOT Vmax:      88.60 cm/s LVOT Vmean:     58.200 cm/s LVOT VTI:       0.181 m  AORTA Ao Root diam: 3.60 cm MITRAL VALVE                TRICUSPID VALVE MV Area (PHT): 3.01 cm     TR Peak grad:   24.0 mmHg MV Decel Time: 252 msec     TR Vmax:        245.00 cm/s MV E velocity: 55.00 cm/s MV A velocity: 101.00 cm/s  SHUNTS MV E/A ratio:  0.54         Systemic VTI:  0.18 m                             Systemic Diam: 2.00 cm Dwayne D Callwood MD  Electronically signed by Cara JONETTA Lovelace MD Signature Date/Time: 12/01/2023/3:49:11 PM    Final     Scheduled Meds:  amLODipine   5 mg Oral Daily   apixaban   2.5 mg Oral BID   atorvastatin   40 mg Oral Daily   bisoprolol   2.5 mg Oral Daily   carbidopa -levodopa   1 tablet Oral TID   clopidogrel   75 mg Oral Daily   donepezil   5 mg Oral QHS   insulin  aspart  0-9 Units Subcutaneous TID WC   levothyroxine   75 mcg Oral Q0600   polyethylene glycol  17 g Oral Daily   sodium chloride  flush  3 mL Intravenous Once   Continuous Infusions:  sodium chloride  75 mL/hr at 12/01/23 1150   PRN Meds: acetaminophen  **OR** acetaminophen , albuterol , bisacodyl , hydrALAZINE  **OR** hydrALAZINE , ondansetron  **OR** ondansetron  (ZOFRAN ) IV  Time spent: 40 minutes  Author: ELVAN SOR. MD [facility] 12/01/2023 3:54 PM  To reach On-call, see care teams to locate the attending and reach out to them via [email or web address]. If 7PM-7AM, please contact night-coverage If you still have difficulty reaching the attending provider, please page the [facility] (Director on Call) for [facility] on amion for assistance.

## 2023-12-01 NOTE — Plan of Care (Signed)
  Problem: Fluid Volume: Goal: Ability to maintain a balanced intake and output will improve Outcome: Progressing   Problem: Nutritional: Goal: Maintenance of adequate nutrition will improve Outcome: Progressing   Problem: Clinical Measurements: Goal: Respiratory complications will improve Outcome: Progressing   Problem: Elimination: Goal: Will not experience complications related to bowel motility Outcome: Progressing Goal: Will not experience complications related to urinary retention Outcome: Progressing   Problem: Pain Managment: Goal: General experience of comfort will improve and/or be controlled Outcome: Progressing   Problem: Safety: Goal: Ability to remain free from injury will improve Outcome: Progressing   Problem: Education: Goal: Ability to describe self-care measures that may prevent or decrease complications (Diabetes Survival Skills Education) will improve Outcome: Not Progressing   Problem: Coping: Goal: Ability to adjust to condition or change in health will improve Outcome: Not Progressing   Problem: Metabolic: Goal: Ability to maintain appropriate glucose levels will improve Outcome: Not Progressing   Problem: Education: Goal: Knowledge of General Education information will improve Description: Including pain rating scale, medication(s)/side effects and non-pharmacologic comfort measures Outcome: Not Progressing

## 2023-12-01 NOTE — Progress Notes (Signed)
  Echocardiogram 2D Echocardiogram has been performed. Definity  IV ultrasound imaging agent used on this study.  Victor Gould 12/01/2023, 8:34 AM

## 2023-12-01 NOTE — Plan of Care (Signed)

## 2023-12-01 NOTE — Progress Notes (Addendum)
       CROSS COVER NOTE  NAME: Victor Gould MRN: [id number] DOB : [birth year]    Concern as stated by nurse / staff   the patient here for CVA he has a history of dementia and keep getting out of bed. He's currently getting continuous. Can I get something to help him calm down.      Pertinent findings on chart review: Hx parkinsons, dementia and CVA  Patient Assessment   Assessment and  Interventions   Assessment:  delirium  Plan: Seroquel  qhs Haldol  will be ordered if he becomes agitated Delirium precautions X

## 2023-12-02 ENCOUNTER — Inpatient Hospital Stay

## 2023-12-02 DIAGNOSIS — I639 Cerebral infarction, unspecified: Secondary | ICD-10-CM | POA: Diagnosis not present

## 2023-12-02 LAB — BASIC METABOLIC PANEL WITH GFR
Anion gap: 12 (ref 5–15)
BUN: 37 mg/dL — ABNORMAL HIGH (ref 8–23)
CO2: 17 mmol/L — ABNORMAL LOW (ref 22–32)
Calcium: 7.5 mg/dL — ABNORMAL LOW (ref 8.9–10.3)
Chloride: 108 mmol/L (ref 98–111)
Creatinine, Ser: 3.37 mg/dL — ABNORMAL HIGH (ref 0.61–1.24)
GFR, Estimated: 17 mL/min — ABNORMAL LOW (ref >60)
Glucose, Bld: 189 mg/dL — ABNORMAL HIGH (ref 70–99)
Potassium: 3.9 mmol/L (ref 3.5–5.1)
Sodium: 137 mmol/L (ref 135–145)

## 2023-12-02 LAB — CBC
HCT: 39.5 % (ref 39.0–52.0)
Hemoglobin: 13.5 g/dL (ref 13.0–17.0)
MCH: 30.7 pg (ref 26.0–34.0)
MCHC: 34.2 g/dL (ref 30.0–36.0)
MCV: 89.8 fL (ref 80.0–100.0)
Platelets: 222 K/uL (ref 150–400)
RBC: 4.4 MIL/uL (ref 4.22–5.81)
RDW: 13.5 % (ref 11.5–15.5)
WBC: 8.8 K/uL (ref 4.0–10.5)
nRBC: 0 % (ref 0.0–0.2)

## 2023-12-02 LAB — GLUCOSE, CAPILLARY
Glucose-Capillary: 206 mg/dL — ABNORMAL HIGH (ref 70–99)
Glucose-Capillary: 168 mg/dL — ABNORMAL HIGH (ref 70–99)
Glucose-Capillary: 131 mg/dL — ABNORMAL HIGH (ref 70–99)

## 2023-12-02 LAB — MAGNESIUM: Magnesium: 2 mg/dL (ref 1.7–2.4)

## 2023-12-02 LAB — PHOSPHORUS: Phosphorus: 3.1 mg/dL (ref 2.5–4.6)

## 2023-12-02 MED ORDER — AMLODIPINE BESYLATE 10 MG PO TABS
10.0000 mg | ORAL_TABLET | Freq: Every day | ORAL | Status: DC
  Administered 2023-12-03 – 2023-12-14 (×12): 10 mg via ORAL
  Filled 2023-12-02 (×12): qty 1

## 2023-12-02 MED ORDER — HALOPERIDOL LACTATE 5 MG/ML IJ SOLN
1.0000 mg | Freq: Four times a day (QID) | INTRAMUSCULAR | Status: DC | PRN
  Administered 2023-12-02: 1 mg via INTRAVENOUS
  Filled 2023-12-02: qty 1

## 2023-12-02 MED ORDER — SODIUM BICARBONATE 8.4 % IV SOLN
100.0000 meq | Freq: Once | INTRAVENOUS | Status: AC
  Administered 2023-12-02: 100 meq via INTRAVENOUS
  Filled 2023-12-02: qty 100

## 2023-12-02 MED ORDER — BISOPROLOL FUMARATE 5 MG PO TABS
5.0000 mg | ORAL_TABLET | Freq: Every day | ORAL | Status: DC
  Administered 2023-12-03 – 2023-12-14 (×12): 5 mg via ORAL
  Filled 2023-12-02 (×12): qty 1

## 2023-12-02 MED ORDER — ZINC OXIDE 40 % EX OINT
TOPICAL_OINTMENT | CUTANEOUS | Status: DC | PRN
  Administered 2023-12-03: 1 via TOPICAL
  Filled 2023-12-02: qty 113

## 2023-12-02 MED ORDER — AMLODIPINE BESYLATE 5 MG PO TABS
5.0000 mg | ORAL_TABLET | Freq: Once | ORAL | Status: AC
  Administered 2023-12-02: 5 mg via ORAL
  Filled 2023-12-02: qty 1

## 2023-12-02 MED ORDER — BISOPROLOL FUMARATE 5 MG PO TABS
2.5000 mg | ORAL_TABLET | Freq: Once | ORAL | Status: AC
  Administered 2023-12-02: 2.5 mg via ORAL
  Filled 2023-12-02: qty 0.5
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
## 2023-12-02 NOTE — Plan of Care (Signed)
  Problem: Metabolic: Goal: Ability to maintain appropriate glucose levels will improve Outcome: Progressing   Problem: Health Behavior/Discharge Planning: Goal: Ability to manage health-related needs will improve Outcome: Progressing   Problem: Nutrition: Goal: Adequate nutrition will be maintained Outcome: Progressing   Problem: Elimination: Goal: Will not experience complications related to bowel motility Outcome: Progressing Goal: Will not experience complications related to urinary retention Outcome: Progressing   Problem: Safety: Goal: Ability to remain free from injury will improve Outcome: Progressing   Problem: Skin Integrity: Goal: Risk for impaired skin integrity will decrease Outcome: Progressing

## 2023-12-02 NOTE — Progress Notes (Signed)
 [facility] Progress Note  Patient: Victor Gould    FMW:[id number]  DOA: 11/28/2023     Date of Service: the patient was seen and examined on 12/02/2023  Chief Complaint  Patient presents with   Altered Mental Status   Brief hospital course: Victor Gould is a 83 y.o. male with a PMH significant for CAD s/p CABG, HTN, afib, CKDIII, Type II DM, hypothyroidism, h/o prostate cancer, HLD, HF, h/o CVA, subdural hematoma (3 years prior), parkinson's disease. At baseline, they live with their wife and are independent with ADLs.   They presented from home to the ED on 11/28/2023 with confusion x1 days. Wife states that he has been using inappropriate words and actions throughout the day. He did not have any injury and he denies having any symptoms. He denies any focalized weakness or speech abnormalities. He endorses good adherence with his medications which includes aspirin , plavix , and eliquis .    ED w/up: VS significantly elevated blood pressure up to 216/84, mild bradycardia in high 50s. Otherwise stable.  Labs unremarkable CBC, glucose 252, Cr 3.43 ( roughly baseline) Brain MRI: 1. Small acute infarct in the left insula and many small acute infarcts in the overlying left parietal lobe (posterior left MCA territory). 2. Many remote infarcts, as detailed above. ( As shown in CT) 3. Chronic right vertebral artery occlusion.   They were initially treated with nothing.    Patient was admitted to medicine service for further workup and management of acute CVA as outlined in detail below.    Assessment and Plan:  # Acute CVA Patient presented with aphasia, AMS, no focal deficits MRI brain positive Neurology consulted, recommended to continue Eliquis  and Plavix  for CAD PT/OT/SLP eval done, recommended home health PT and speech therapy. Carotid duplex negative for any significant stenosis TTE LVEF 50 to 55%, grade 1 diastolic dysfunction, no WMA, negative PFO.   # AKI on CKD  stage IV Baseline sCr 2.64 which was 2 year ago  8/3 creatinine 3.43>>3.06>>3.17 elevated Encourage oral hydration Bladder scan negative for retention Monitor urine output and BMP daily 8/3 started IV fluid for gentle hydration  # Metabolic acidosis, CO2 19, started oral bicarbonate.  # Hypophosphatemia, Phos repleted. Monitor and replete as needed.  # HTN- severely elevated S/p permissive hypertension for 48 hours -Amlodipine  5 mg p.o. daily -Bisoprolol  2.5 mg p.o. daily - Hydralazine  IV vs po prn Monitor BP and titrate medications accordingly  # Type II DM - sSSI - A1c 7.4 well-controlled     # Parkinsons - continue home sinemet    # Hypothyroidism - continue home synthroid   # Sundowning and delirium with agitation 8/4 patient was given a dose of Haldol  the patient was more sleepy Started Seroquel  Avoid Haldol  use due to Parkinson's disease   Body mass index is 23.64 kg/m.  Interventions:  Diet: Regular diet DVT Prophylaxis: Eliquis   Advance goals of care discussion: Full code  Family Communication: family was present at bedside, at the time of interview.  The pt provided permission to discuss medical plan with the family. Opportunity was given to ask question and all questions were answered satisfactorily.   Disposition:  Pt is from Home, admitted with Acute CVA, and AKI, on IVF,  which precludes a safe discharge. Discharge to home with home health services, most likely discharge tomorrow a.m.  Subjective: Overnight patient got confused and agitated so patient was given a dose of Haldol , in the morning time patient was more sleepy but he was  awake.  Patient does not remember what happened last night. Management plan discussed with patient's wife at bedside.   Physical Exam: General: NAD, lying comfortably Appear in no distress, affect appropriate Eyes: PERRLA ENT: Oral Mucosa Clear, moist  Neck: no JVD,  Cardiovascular: S1 and S2 Present, no Murmur,   Respiratory: good respiratory effort, Bilateral Air entry equal and Decreased, no Crackles, no wheezes Abdomen: Bowel Sound present, Soft and no tenderness,  Skin: no rashes Extremities: no Pedal edema, no calf tenderness Neurologic: without any new focal findings Gait not checked due to patient safety concerns  Vitals:   12/02/23 0350 12/02/23 0810 12/02/23 1210 12/02/23 1607  BP: (!) 179/77 (!) 174/86 (!) 173/75 (!) 173/76  Pulse: 87 90 73 78  Resp: 18 16 16 14   Temp: 98.1 F (36.7 C) 98.4 F (36.9 C) 98.2 F (36.8 C) 99.3 F (37.4 C)  TempSrc:   Oral   SpO2: 97% 97% 98% 97%  Weight:        Intake/Output Summary (Last 24 hours) at 12/02/2023 1825 Last data filed at 12/02/2023 1041 Gross per 24 hour  Intake 922.52 ml  Output --  Net 922.52 ml   Filed Weights   11/28/23 1836  Weight: 72.6 kg    Data Reviewed: I have personally reviewed and interpreted daily labs, tele strips, imagings as discussed above. I reviewed all nursing notes, pharmacy notes, vitals, pertinent old records I have discussed plan of care as described above with RN and patient/family.  CBC: Recent Labs  Lab 11/28/23 1846 11/29/23 0521 11/30/23 1218 12/01/23 0455 12/02/23 0603  WBC 6.8 7.8 7.1 7.6 8.8  NEUTROABS 4.5  --   --   --   --   HGB 13.1 13.6 14.0 14.4 13.5  HCT 40.8 39.7 40.9 41.8 39.5  MCV 93.2 89.4 89.7 89.3 89.8  PLT 235 230 228 227 222   Basic Metabolic Panel: Recent Labs  Lab 11/28/23 1846 11/29/23 0521 11/30/23 1218 12/01/23 0455 12/02/23 0603  NA 138 140 139 139 137  K 4.5 3.6 4.0 3.8 3.9  CL 110 111 111 108 108  CO2 21* 22 19* 20* 17*  GLUCOSE 252* 103* 142* 176* 189*  BUN 35* 35* 35* 38* 37*  CREATININE 3.43* 3.21* 3.06* 3.17* 3.37*  CALCIUM  8.4* 8.2* 8.4* 8.2* 7.5*  MG  --   --  2.2 2.1 2.0  PHOS  --   --  2.0* 4.5 3.1    Studies: US  RENAL Result Date: 12/02/2023 CLINICAL DATA:  Acute renal insufficiency. EXAM: RENAL / URINARY TRACT ULTRASOUND COMPLETE  COMPARISON:  06/23/2020 FINDINGS: Right Kidney: Renal measurements: 8.9 x 4.4 x 5.3 cm = volume: 109 mL. Slight increased parenchymal echogenicity. Normal cortical thickness. No mass or hydronephrosis visualized. Left Kidney: Renal measurements: 8.6 x 4.5 x 4.3 cm = volume: 87 mL. Slight increased parenchymal echogenicity with normal cortical thickness. No mass or hydronephrosis visualized. Bladder: Appears normal for degree of bladder distention. Other: None. IMPRESSION: Normal size kidneys with findings which can be seen with medical renal disease. No hydronephrosis. Electronically Signed   By: Toribio Agreste M.D.   On: 12/02/2023 11:52    Scheduled Meds:  [START ON 12/03/2023] amLODipine   10 mg Oral Daily   apixaban   2.5 mg Oral BID   atorvastatin   40 mg Oral Daily   [START ON 12/03/2023] bisoprolol   5 mg Oral Daily   carbidopa -levodopa   1 tablet Oral TID   clopidogrel   75 mg Oral Daily  donepezil   5 mg Oral QHS   insulin  aspart  0-9 Units Subcutaneous TID WC   levothyroxine   75 mcg Oral Q0600   polyethylene glycol  17 g Oral Daily   QUEtiapine   25 mg Oral QHS   sodium chloride  flush  3 mL Intravenous Once   Continuous Infusions:   PRN Meds: acetaminophen  **OR** acetaminophen , albuterol , bisacodyl , hydrALAZINE  **OR** hydrALAZINE , liver oil-zinc  oxide, ondansetron  **OR** ondansetron  (ZOFRAN ) IV  Time spent: 40 minutes  Author: ELVAN SOR. MD [facility] 12/02/2023 6:25 PM  To reach On-call, see care teams to locate the attending and reach out to them via [email or web address]. If 7PM-7AM, please contact night-coverage If you still have difficulty reaching the attending provider, please page the [facility] (Director on Call) for [facility] on amion for assistance.

## 2023-12-02 NOTE — Inpatient Diabetes Management (Signed)
 Inpatient Diabetes Program Recommendations  AACE/ADA: New Consensus Statement on Inpatient Glycemic Control  Target Ranges:  Prepandial:   less than 140 mg/dL      Peak postprandial:   less than 180 mg/dL (1-2 hours)      Critically ill patients:  140 - 180 mg/dL    Latest Reference Range & Units 12/01/23 06:19 12/01/23 08:31 12/01/23 11:22 12/01/23 15:50 12/01/23 19:54 12/02/23 08:13  Glucose-Capillary 70 - 99 mg/dL 829 (H) 801 (H) 620 (H) 94 136 (H) 206 (H)   Review of Glycemic Control  Diabetes history: DM2 Outpatient Diabetes medications: Lantus  10 units QAM, Lantus  5 units at bedtime, Humalog 5 units with breakfast plus SSI Current orders for Inpatient glycemic control: Novolog  0-9 units TID with meals  Inpatient Diabetes Program Recommendations:    Insulin : CBG up to 379 mg/dl at 88:77 on 8/3 after eating 75% of breakfast. If post prandial glucose remains consistently elevated and patient is eating at least 50% of meal, may need to consider adding low dose meal coverage insulin .  Thanks, Earnie Gainer, RN, MSN, CDCES Diabetes Coordinator Inpatient Diabetes Program [phone] (Team Pager from 8am to 5pm)

## 2023-12-02 NOTE — Care Management Important Message (Signed)
 Important Message  Patient Details  Name: Victor Gould MRN: [id number] Date of Birth: 02-28-1941   Important Message Given:  Yes - Medicare IM     Brylin Stanislawski W, CMA 12/02/2023, 9:37 AM

## 2023-12-02 NOTE — Plan of Care (Signed)
 TTE: 1. Left ventricular ejection fraction, by estimation, is 50 to 55%. The  left ventricle has low normal function. The left ventricle has no regional  wall motion abnormalities. Left ventricular diastolic parameters are  consistent with Grade I diastolic dysfunction (impaired relaxation).   2. Right ventricular systolic function is normal. The right ventricular  size is normal.   3. The mitral valve is normal in structure. Trivial mitral valve  regurgitation.   4. The aortic valve is normal in structure. Aortic valve regurgitation is  not visualized.   MRA head: 1. 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 2. Mild-to-moderate irregular stenosis of the M1 branches of the middle cerebral arteries bilaterally. 3. Near occlusive stenosis of the sylvian M2 branch of the left middle cerebral artery and opercular M3 branch of the right middle cerebral artery. 4. Moderate stenosis of the distal basilar artery. 5. Diminutive or moderately stenotic bun segments of the posterior cerebral arteries. 6. Severe stenosis of the distal P1 segment of the right posterior cerebral artery and near occlusive stenosis of the distal right posterior communicating artery. 7. Patent but diminutive superior cerebellar arteries. 8. Moderate-to-severe stenosis of the right anterior inferior cerebellar artery. The left anterior inferior cerebellar artery is not clearly demonstrated.  Carotid ultrasound: Scattered bilateral partially calcified plaque, more right than left. No hemodynamic significant stenosis of either ICA.  Assessment/Recommendations: 83 y.o. male with a PMHx of CAD s/p CABG, HTN, a fib on eliquis , CKDIII, DM2, hypothyroidism, f/o prostate cancer, HL,HF, h/o CVA, subdural hematoma, mild cognitive impairment, Parkinson's disease who presented with aphasia. MRI revealed a small acute infarct in the left insula and many small acute infarcts in the overlying  left parietal lobe. - Stroke work up is complete.  - Continue Eliquis .  - Also on Plavix , which is prescribed for his CAD  - Continue atorvastatin  - Outpatient Neurology follow up.  - Neurohospitalist service will sign off. Please call if there are additional questions.   Electronically signed: Dr. Marshella Tello

## 2023-12-02 NOTE — Progress Notes (Signed)
 Occupational Therapy Treatment Patient Details Name: Victor Gould MRN: [id number] DOB: 02/01/1941 Today's Date: 12/02/2023   History of present illness Pt is an 83 y.o. male presented from home to the ED on 11/28/2023 with confusion and using inappropriate words and actions throughout the day. 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 PMH significant for CAD s/p CABG, HTN, afib, CKDIII, Type II DM, hypothyroidism, h/o prostate cancer, HLD, HF, h/o CVA, subdural hematoma (3 years prior), parkinson's disease.   OT comments  Pt is supine in bed on arrival. Wife at bedside. Per PT pt with further cognitive decline since evaluations last week. Pt asleep on entry, but easily aroused. Noted to only answer yes to all questions, otherwise speaks jibberish this date with inability to comprehend any speech other than yes. Pt unable to state his name, wife's name, where he is and overall unable to converse with dino this date. He needed increased time and cueing for initiation, sequencing, problem solving of all activities this session. Continues to be MOD I with bed mobility, CGA for STS and Min A for in room ambulation via HHA or pushing IV pole with BUEs with constant cueing and redirection for obstacle maneuvering and how to open bathroom door, etc. MD notified of concerns in change in status. Max A for peri-care this date d/t cognitive deficits. Min/CGA for safety with toileting tasks and hand over hand placement on grab bars. Pt returned to bed with all needs in place and will cont to require skilled acute OT services to maximize his safety and IND to return to PLOF.       If plan is discharge home, recommend the following:  Direct supervision/assist for medications management;Supervision due to cognitive status;Direct supervision/assist for financial management;A lot of help with bathing/dressing/bathroom;A lot of help with walking and/or transfers;A  little help with walking and/or transfers   Equipment Recommendations  Other (comment);BSC/3in1;Tub/shower seat (RW)    Recommendations for Other Services      Precautions / Restrictions Precautions Precautions: Fall Recall of Precautions/Restrictions: Intact Restrictions Weight Bearing Restrictions Per Provider Order: No       Mobility Bed Mobility Overal bed mobility: Modified Independent       Supine to sit: Modified independent (Device/Increase time) Sit to supine: Modified independent (Device/Increase time)   General bed mobility comments: increased time and cues to perform all bed mobility this date for initiation, etc.    Transfers Overall transfer level: Needs assistance Equipment used: 1 person hand held assist (and pushed IV pole with BUEs) Transfers: Sit to/from Stand Sit to Stand: Contact guard assist           General transfer comment: CGA to stand, intially HHA for ambulation to bathroom with Min A for safety and cues for redirection/turns, where to go; pushed IV pole to return from bathroom with min A needed this date d/t worsening cogntion; 1 step commands only     Balance Overall balance assessment: Needs assistance Sitting-balance support: Feet supported Sitting balance-Leahy Scale: Fair     Standing balance support: Reliant on assistive device for balance, During functional activity, Bilateral upper extremity supported, Single extremity supported Standing balance-Leahy Scale: Fair Standing balance comment: difficulty with obstacle negotiation and balance needing HHA or pushed IV pole                           ADL either performed or assessed  with clinical judgement   ADL Overall ADL's : Needs assistance/impaired                     Lower Body Dressing: Maximal assistance;Moderate assistance;Sit to/from stand;Sitting/lateral leans Lower Body Dressing Details (indicate cue type and reason): increased time and cueing needed  to perform, unable to thread BLEs through leg holes without assist Toilet Transfer: Minimal assistance;Regular Toilet;Ambulation;Grab bars Toilet Transfer Details (indicate cue type and reason): pushed IV pole within the room or utilized HHA from therapist with min A needed this date d/t worsening cogntion Toileting- Clothing Manipulation and Hygiene: Maximal assistance;Sit to/from stand;Moderate assistance Toileting - Clothing Manipulation Details (indicate cue type and reason): pt attempts peri-care but could not sequence the steps and kep trying to clean the front vs the back after BM; essentially Max/Mod A for thorough cleaning     Functional mobility during ADLs: Minimal assistance;Contact guard assist;Cueing for safety;Cueing for sequencing      Extremity/Trunk Assessment              Vision       Perception     Praxis     Communication Communication Communication: Impaired Factors Affecting Communication: Hearing impaired   Cognition Arousal: Alert Behavior During Therapy: WFL for tasks assessed/performed, Flat affect                                 Following commands: Intact, Impaired Following commands impaired: Follows one step commands with increased time, Follows one step commands inconsistently      Cueing   Cueing Techniques: Verbal cues  Exercises      Shoulder Instructions       General Comments only answers yes to all questions, otherwise speaks jibberish this date; MD notified of concerns in change in status; increased difficulty sequencing and initiating tasks and problem solving for peri-care    Pertinent Vitals/ Pain       Pain Assessment Pain Assessment: No/denies pain  Home Living                                          Prior Functioning/Environment              Frequency  Min 2X/week        Progress Toward Goals  OT Goals(current goals can now be found in the care plan section)  Progress  towards OT goals: Progressing toward goals  Acute Rehab OT Goals Patient Stated Goal: get better OT Goal Formulation: With patient/family Time For Goal Achievement: 12/13/23 Potential to Achieve Goals: Good  Plan      Co-evaluation                 AM-PAC OT 6 Clicks Daily Activity     Outcome Measure   Help from another person eating meals?: A Little Help from another person taking care of personal grooming?: A Little Help from another person toileting, which includes using toliet, bedpan, or urinal?: A Lot Help from another person bathing (including washing, rinsing, drying)?: A Lot Help from another person to put on and taking off regular upper body clothing?: A Little Help from another person to put on and taking off regular lower body clothing?: A Lot 6 Click Score: 15    End of Session Equipment Utilized During Treatment: Gait  belt      Activity Tolerance Patient tolerated treatment well   Patient Left in bed;with call bell/phone within reach;with bed alarm set;with family/visitor present   Nurse Communication Mobility status        Time: 8649-8561 OT Time Calculation (min): 48 min  Charges: OT General Charges $OT Visit: 1 Visit OT Treatments $Self Care/Home Management : 38-52 mins  Deanglo Hissong, OTR/L  12/02/23, 5:12 PM   Eshaal Duby E Jex Strausbaugh 12/02/2023, 5:09 PM

## 2023-12-02 NOTE — Progress Notes (Addendum)
 Physical Therapy Treatment Patient Details Name: Victor Gould MRN: [id number] DOB: 1940/11/28 Today's Date: 12/02/2023   History of Present Illness Pt is an 83 y.o. male presented from home to the ED on 11/28/2023 with confusion and using inappropriate words and actions throughout the day. 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 PMH significant for CAD s/p CABG, HTN, afib, CKDIII, Type II DM, hypothyroidism, h/o prostate cancer, HLD, HF, h/o CVA, subdural hematoma (3 years prior), parkinson's disease.    PT Comments  Pt limited with progress this date due to increased AMS, requiring max multimodal cues for safety, sequencing, problem solving, hand placement, and correct performance of task. AMS could be related to haldol  given 0200 this date; care team notified. If symptoms do not resolve with time, he may benefit from additional work up to rule out further neuro insult.  Today, he is mod I for bed mobility and requires CGA for transfers and gait at 78ft in RW. Spouse concerned about him returning home in this state, as he is currently at increased risk of falls due to significant AMS which is different from his baseline. Pt will continue to benefit from skilled acute PT services to address deficits for return to baseline function. Will continue to assess appropriateness of POC, and update as needed.    If plan is discharge home, recommend the following: Assist for transportation;Assistance with cooking/housework;Supervision due to cognitive status   Can travel by private vehicle        Equipment Recommendations  None recommended by PT    Recommendations for Other Services       Precautions / Restrictions Precautions Precautions: Fall Recall of Precautions/Restrictions: Intact Restrictions Weight Bearing Restrictions Per Provider Order: No     Mobility  Bed Mobility Overal bed mobility: Modified Independent              General bed mobility comments: mod I to sit EOB, HOB slightly elevated, use of BUE for support, increased time/effort    Transfers Overall transfer level: Needs assistance Equipment used: Rolling walker (2 wheels) Transfers: Sit to/from Stand Sit to Stand: Contact guard assist           General transfer comment: CGA for safety to stand from EOB and toilet with RW, requiring increased time for transition of UE from EOB>RW and to achieve full upright posture, posterior LE bracing on bed for steadying. CGA for safety to sit on toilet and in recliner with RW; max multimodal cues for safety, sequencing, hand placement, and RW proximity.    Ambulation/Gait Ambulation/Gait assistance: Contact guard assist Gait Distance (Feet): 90 Feet Assistive device: Rolling walker (2 wheels)         General Gait Details: CGA for safety and line mgmt to ambulate in room and hallway with RW. Due to impaired cognition this date, he requires max multimodal cues for safety, sequencing, RW proximity and obstacle negotiation. Continues to present with mild kyphotic posture, decreased step length/foot clearance, and slowed cadence.     Balance Overall balance assessment: Needs assistance Sitting-balance support: Feet supported Sitting balance-Leahy Scale: Fair Sitting balance - Comments: difficulty holding head up, forearm resting on knees   Standing balance support: Reliant on assistive device for balance, During functional activity, Bilateral upper extremity supported Standing balance-Leahy Scale: Fair Standing balance comment: in RW difficulty with obstacle negotiation and balance  Communication Communication Communication: Impaired Factors Affecting Communication: Hearing impaired  Cognition Arousal: Alert Behavior During Therapy: WFL for tasks assessed/performed, Flat affect   PT - Cognitive impairments: Memory, Awareness, Orientation, History of cognitive  impairments, Attention, Initiation, Problem solving, Sequencing, Safety/Judgement                       PT - Cognition Comments: baseline dementia, is not oriented to self, difficulty following commands requiring increased multimodal cues for attention to task. Spouse at bedside stating this is not pt's baseline. He received Haldol  at 0200 today which could be contributing to current presentation. Following commands: Intact, Impaired Following commands impaired: Follows one step commands with increased time, Follows one step commands inconsistently    Cueing Cueing Techniques: Verbal cues  Exercises Other Exercises Other Exercises: Pt and spouse educated re: PT role/POC, DC recommendations, current presentation compared to previous session.    General Comments General comments (skin integrity, edema, etc.): Assistance required for toileting due to impaired cognition which was limiting depth perception, problem solving, and sequencing of task.      Pertinent Vitals/Pain Pain Assessment Pain Assessment: No/denies pain     PT Goals (current goals can now be found in the care plan section) Acute Rehab PT Goals Patient Stated Goal: to go home PT Goal Formulation: With patient Time For Goal Achievement: 12/13/23 Potential to Achieve Goals: Good Progress towards PT goals: Not progressing toward goals - comment (AMS which could be due to haldol )    Frequency    Min 1X/week       AM-PAC PT 6 Clicks Mobility   Outcome Measure  Help needed turning from your back to your side while in a flat bed without using bedrails?: None Help needed moving from lying on your back to sitting on the side of a flat bed without using bedrails?: None Help needed moving to and from a bed to a chair (including a wheelchair)?: A Little Help needed standing up from a chair using your arms (e.g., wheelchair or bedside chair)?: A Little Help needed to walk in hospital room?: A Little Help needed  climbing 3-5 steps with a railing? : A Little 6 Click Score: 20    End of Session Equipment Utilized During Treatment: Gait belt Activity Tolerance: Patient limited by fatigue Patient left: with call bell/phone within reach;with family/visitor present;in chair;with chair alarm set Nurse Communication: Mobility status PT Visit Diagnosis: Unsteadiness on feet (R26.81);History of falling (Z91.81)     Time: 9064-8994 PT Time Calculation (min) (ACUTE ONLY): 30 min  Charges:    $Therapeutic Activity: 23-37 mins PT General Charges $$ ACUTE PT VISIT: 1 Visit                      Camie CHARLENA Kluver, PT, DPT 11:58 AM,12/02/23 Physical Therapist - [place] [facility]

## 2023-12-03 DIAGNOSIS — I639 Cerebral infarction, unspecified: Secondary | ICD-10-CM | POA: Diagnosis not present

## 2023-12-03 LAB — CBC
HCT: 39 % (ref 39.0–52.0)
Hemoglobin: 13.2 g/dL (ref 13.0–17.0)
MCH: 30.6 pg (ref 26.0–34.0)
MCHC: 33.8 g/dL (ref 30.0–36.0)
MCV: 90.5 fL (ref 80.0–100.0)
Platelets: 219 K/uL (ref 150–400)
RBC: 4.31 MIL/uL (ref 4.22–5.81)
RDW: 13.7 % (ref 11.5–15.5)
WBC: 8.4 K/uL (ref 4.0–10.5)
nRBC: 0 % (ref 0.0–0.2)

## 2023-12-03 LAB — GLUCOSE, CAPILLARY
Glucose-Capillary: 163 mg/dL — ABNORMAL HIGH (ref 70–99)
Glucose-Capillary: 154 mg/dL — ABNORMAL HIGH (ref 70–99)
Glucose-Capillary: 208 mg/dL — ABNORMAL HIGH (ref 70–99)
Glucose-Capillary: 115 mg/dL — ABNORMAL HIGH (ref 70–99)

## 2023-12-03 LAB — BASIC METABOLIC PANEL WITH GFR
Anion gap: 11 (ref 5–15)
BUN: 38 mg/dL — ABNORMAL HIGH (ref 8–23)
CO2: 19 mmol/L — ABNORMAL LOW (ref 22–32)
Calcium: 7.5 mg/dL — ABNORMAL LOW (ref 8.9–10.3)
Chloride: 109 mmol/L (ref 98–111)
Creatinine, Ser: 3.25 mg/dL — ABNORMAL HIGH (ref 0.61–1.24)
GFR, Estimated: 18 mL/min — ABNORMAL LOW (ref >60)
Glucose, Bld: 161 mg/dL — ABNORMAL HIGH (ref 70–99)
Potassium: 3.8 mmol/L (ref 3.5–5.1)
Sodium: 139 mmol/L (ref 135–145)

## 2023-12-03 LAB — MAGNESIUM: Magnesium: 2.1 mg/dL (ref 1.7–2.4)

## 2023-12-03 LAB — PHOSPHORUS: Phosphorus: 2.9 mg/dL (ref 2.5–4.6)

## 2023-12-03 MED ORDER — SODIUM BICARBONATE 8.4 % IV SOLN
100.0000 meq | Freq: Once | INTRAVENOUS | Status: AC
  Administered 2023-12-03: 100 meq via INTRAVENOUS
  Filled 2023-12-03: qty 100

## 2023-12-03 MED ORDER — SODIUM CHLORIDE 0.9 % IV SOLN: INTRAVENOUS | Status: AC

## 2023-12-03 MED ORDER — SODIUM BICARBONATE 650 MG PO TABS: 650.0000 mg | ORAL_TABLET | Freq: Two times a day (BID) | ORAL | Status: DC

## 2023-12-03 MED ORDER — SODIUM BICARBONATE 650 MG PO TABS: 650.0000 mg | ORAL_TABLET | Freq: Three times a day (TID) | ORAL | Status: DC

## 2023-12-03 NOTE — Progress Notes (Signed)
 Occupational Therapy Treatment Patient Details Name: Victor Gould MRN: [id number] DOB: Aug 07, 1940 Today's Date: 12/03/2023   History of present illness Pt is an 83 y.o. male presented from home to the ED on 11/28/2023 with confusion and using inappropriate words and actions throughout the day. 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 PMH significant for CAD s/p CABG, HTN, afib, CKDIII, Type II DM, hypothyroidism, h/o prostate cancer, HLD, HF, h/o CVA, subdural hematoma (3 years prior), parkinson's disease.   OT comments  Pt is seated in recliner on arrival. Easily arousable and agreeable to OT session, although initially asleep in recliner. He denies pain. He was able to state his name to the nurse, but responded I don't know to all other orientation questions. He continues to have word finding difficulty, but not as bad as yesterday. Pt performed STS from recliner and toilet with Min A, cues for hand/feet placement. Min A for all mobility within the room using RW with constant cueing for RW management and obstacle negotiation. Pt with poor vision at baseline and no glasses in the room. Mod A needed for LB ADLs this date and Mod A with constant cueing for initiation and staying on task for UB ADLs. Pt returned to recliner with all needs in place and will cont to require skilled acute OT services to maximize his safety and IND to return to PLOF.       If plan is discharge home, recommend the following:  Direct supervision/assist for medications management;Supervision due to cognitive status;Direct supervision/assist for financial management;A lot of help with bathing/dressing/bathroom;A little help with walking and/or transfers   Equipment Recommendations  Other (comment);BSC/3in1;Tub/shower seat (defer)    Recommendations for Other Services      Precautions / Restrictions Precautions Precautions: Fall Recall of  Precautions/Restrictions: Impaired Restrictions Weight Bearing Restrictions Per Provider Order: No       Mobility Bed Mobility               General bed mobility comments: NT pt up in recliner on entry    Transfers Overall transfer level: Needs assistance Equipment used: Rolling walker (2 wheels) Transfers: Sit to/from Stand Sit to Stand: Min assist           General transfer comment: cueing for hand placement and sequencing to stand from recliner and toilet using grab bars, ambulated in room using RW with Min A for obstacle manuvering     Balance Overall balance assessment: Needs assistance Sitting-balance support: Feet supported Sitting balance-Leahy Scale: Fair     Standing balance support: During functional activity, Bilateral upper extremity supported, Reliant on assistive device for balance Standing balance-Leahy Scale: Fair Standing balance comment: RW use and Min A for obstacle negotiation                           ADL either performed or assessed with clinical judgement   ADL Overall ADL's : Needs assistance/impaired         Upper Body Bathing: Minimal assistance;Cueing for sequencing;Sitting Upper Body Bathing Details (indicate cue type and reason): constant cueing for initation and continued performance of task Lower Body Bathing: Sit to/from stand;Sitting/lateral leans;Moderate assistance;Cueing for sequencing Lower Body Bathing Details (indicate cue type and reason): constant cueing for initation and assist for LB bathing d/t safety Upper Body Dressing : Moderate assistance;Sitting Upper Body Dressing Details (indicate cue type and reason): doff/don gown Lower  Body Dressing: Maximal assistance;Moderate assistance;Sit to/from stand;Sitting/lateral leans Lower Body Dressing Details (indicate cue type and reason): increased time and cueing needed to perform, unable to thread BLEs through leg holes without assist Toilet Transfer: Minimal  assistance;Regular Toilet;Ambulation;Grab bars;Rolling walker (2 wheels)   Toileting- Clothing Manipulation and Hygiene: Maximal assistance;Sit to/from stand Toileting - Clothing Manipulation Details (indicate cue type and reason): following BM on toilet     Functional mobility during ADLs: Minimal assistance;Cueing for safety;Cueing for sequencing      Extremity/Trunk Assessment              Vision       Perception     Praxis     Communication Communication Communication: Impaired Factors Affecting Communication: Hearing impaired   Cognition Arousal: Alert Behavior During Therapy: WFL for tasks assessed/performed                                 Following commands: Impaired Following commands impaired: Follows one step commands inconsistently      Cueing   Cueing Techniques: Verbal cues, Gestural cues, Tactile cues, Visual cues  Exercises Other Exercises Other Exercises: extensive conversations with wife about DC recommendations    Shoulder Instructions       General Comments talking more this date but still noted difficulty with word finding    Pertinent Vitals/ Pain       Pain Assessment Pain Assessment: No/denies pain  Home Living                                          Prior Functioning/Environment              Frequency  Min 2X/week        Progress Toward Goals  OT Goals(current goals can now be found in the care plan section)  Progress towards OT goals: Progressing toward goals  Acute Rehab OT Goals Patient Stated Goal: improve function OT Goal Formulation: With patient/family Time For Goal Achievement: 12/13/23 Potential to Achieve Goals: Good  Plan      Co-evaluation                 AM-PAC OT 6 Clicks Daily Activity     Outcome Measure   Help from another person eating meals?: A Little Help from another person taking care of personal grooming?: A Little Help from another person  toileting, which includes using toliet, bedpan, or urinal?: A Lot Help from another person bathing (including washing, rinsing, drying)?: A Lot Help from another person to put on and taking off regular upper body clothing?: A Little Help from another person to put on and taking off regular lower body clothing?: A Lot 6 Click Score: 15    End of Session Equipment Utilized During Treatment: Rolling walker (2 wheels);Gait belt  OT Visit Diagnosis: Other abnormalities of gait and mobility (R26.89)   Activity Tolerance Patient tolerated treatment well   Patient Left with call bell/phone within reach;with family/visitor present;in chair;with chair alarm set   Nurse Communication Mobility status        Time: 8968-8882 OT Time Calculation (min): 46 min  Charges: OT General Charges $OT Visit: 1 Visit OT Treatments $Self Care/Home Management : 38-52 mins  Payson Crumby, OTR/L  12/03/23, 4:12 PM   Dimond Crotty E Faye Sanfilippo 12/03/2023, 4:08 PM

## 2023-12-03 NOTE — Progress Notes (Signed)
 [facility] Progress Note  Patient: Victor Gould    FMW:[id number]  DOA: 11/28/2023     Date of Service: the patient was seen and examined on 12/03/2023  Chief Complaint  Patient presents with   Altered Mental Status   Brief hospital course: Victor Gould is a 83 y.o. male with a PMH significant for CAD s/p CABG, HTN, afib, CKDIII, Type II DM, hypothyroidism, h/o prostate cancer, HLD, HF, h/o CVA, subdural hematoma (3 years prior), parkinson's disease. At baseline, they live with their wife and are independent with ADLs.   They presented from home to the ED on 11/28/2023 with confusion x1 days. Wife states that he has been using inappropriate words and actions throughout the day. He did not have any injury and he denies having any symptoms. He denies any focalized weakness or speech abnormalities. He endorses good adherence with his medications which includes aspirin , plavix , and eliquis .    ED w/up: VS significantly elevated blood pressure up to 216/84, mild bradycardia in high 50s. Otherwise stable.  Labs unremarkable CBC, glucose 252, Cr 3.43 ( roughly baseline) Brain MRI: 1. Small acute infarct in the left insula and many small acute infarcts in the overlying left parietal lobe (posterior left MCA territory). 2. Many remote infarcts, as detailed above. ( As shown in CT) 3. Chronic right vertebral artery occlusion.   They were initially treated with nothing.    Patient was admitted to medicine service for further workup and management of acute CVA as outlined in detail below.    Assessment and Plan:  # Acute CVA Patient presented with aphasia, AMS, no focal deficits MRI brain positive Neurology consulted, recommended to continue Eliquis  and Plavix  for CAD Carotid duplex negative for any significant stenosis TTE LVEF 50 to 55%, grade 1 diastolic dysfunction, no WMA, negative PFO. SLP eval done, patient is on regular diet PT and OT following, initially recommended  home health but due to mental status changes patient will require SNF placement. Follow TOC for SNF placement    # AKI on CKD stage IV Baseline sCr 2.64 which was 2 year ago  8/3 creatinine 3.43>>3.06>>3.17>>3.25 elevated Encourage oral hydration Bladder scan negative for retention Monitor urine output and BMP daily 8/3 started IV fluid for gentle hydration 8/5 bladder scan showed 325, RN was advised to redo bladder scan and follow voiding.  If patient is unable to void then we can insert Foley catheter.   # Metabolic acidosis, CO2 19 8/5 sodium bicarb 100 mEq IV x 1 dose given Started sodium bicarbonate  650 mg p.o. 3 times daily from tomorrow 8/6   # Hypophosphatemia, Phos repleted.  Resolved Monitor and replete as needed.  # HTN- severely elevated S/p permissive hypertension for 48 hours -8/5 increased amlodipine  5-->10 mg p.o. daily -8/5 increased bisoprolol  2.5-->5 mg p.o. daily - Hydralazine  IV vs po prn Monitor BP and titrate medications accordingly  # Type II DM - sSSI - A1c 7.4 well-controlled     # Parkinsons - continue home sinemet    # Hypothyroidism - continue home synthroid   # Sundowning and delirium with agitation 8/4 patient was given a dose of Haldol  the patient was more sleepy Started Seroquel  Avoid Haldol  use due to Parkinson's disease   Body mass index is 23.64 kg/m.  Interventions:  Diet: Regular diet DVT Prophylaxis: Eliquis   Advance goals of care discussion: Full code  Family Communication: family was present at bedside, at the time of interview.  The pt provided permission to discuss  medical plan with the family. Opportunity was given to ask question and all questions were answered satisfactorily.   Disposition:  Pt is from Home, admitted with Acute CVA, and AKI, on IVF,  which precludes a safe discharge. Discharge to SNF, when bed will be available.     Subjective: No significant events overnight, patient was sitting comfortably  on the recliner.  Pleasantly confused, AO x 1.  Did not offer any complaints.  Physical Exam: General: NAD, lying comfortably Appear in no distress, affect appropriate Eyes: PERRLA ENT: Oral Mucosa Clear, moist  Neck: no JVD,  Cardiovascular: S1 and S2 Present, no Murmur,  Respiratory: good respiratory effort, Bilateral Air entry equal and Decreased, no Crackles, no wheezes Abdomen: Bowel Sound present, Soft and no tenderness,  Skin: no rashes Extremities: no Pedal edema, no calf tenderness Neurologic: without any new focal findings Gait not checked due to patient safety concerns  Vitals:   12/03/23 0037 12/03/23 0432 12/03/23 0734 12/03/23 1142  BP: (!) 168/75 (!) 165/71 (!) 180/71 (!) 160/67  Pulse: 77 72 70 72  Resp: (!) 24 (!) 24 18 18   Temp: 98.9 F (37.2 C) 98.5 F (36.9 C) 98.8 F (37.1 C) 98 F (36.7 C)  TempSrc:  Oral  Oral  SpO2: 95% 94% 96% 96%  Weight:       No intake or output data in the 24 hours ending 12/03/23 1605  Filed Weights   11/28/23 1836  Weight: 72.6 kg    Data Reviewed: I have personally reviewed and interpreted daily labs, tele strips, imagings as discussed above. I reviewed all nursing notes, pharmacy notes, vitals, pertinent old records I have discussed plan of care as described above with RN and patient/family.  CBC: Recent Labs  Lab 11/28/23 1846 11/29/23 0521 11/30/23 1218 12/01/23 0455 12/02/23 0603 12/03/23 0455  WBC 6.8 7.8 7.1 7.6 8.8 8.4  NEUTROABS 4.5  --   --   --   --   --   HGB 13.1 13.6 14.0 14.4 13.5 13.2  HCT 40.8 39.7 40.9 41.8 39.5 39.0  MCV 93.2 89.4 89.7 89.3 89.8 90.5  PLT 235 230 228 227 222 219   Basic Metabolic Panel: Recent Labs  Lab 11/29/23 0521 11/30/23 1218 12/01/23 0455 12/02/23 0603 12/03/23 0455  NA 140 139 139 137 139  K 3.6 4.0 3.8 3.9 3.8  CL 111 111 108 108 109  CO2 22 19* 20* 17* 19*  GLUCOSE 103* 142* 176* 189* 161*  BUN 35* 35* 38* 37* 38*  CREATININE 3.21* 3.06* 3.17* 3.37*  3.25*  CALCIUM  8.2* 8.4* 8.2* 7.5* 7.5*  MG  --  2.2 2.1 2.0 2.1  PHOS  --  2.0* 4.5 3.1 2.9    Studies: No results found.   Scheduled Meds:  amLODipine   10 mg Oral Daily   apixaban   2.5 mg Oral BID   atorvastatin   40 mg Oral Daily   bisoprolol   5 mg Oral Daily   carbidopa -levodopa   1 tablet Oral TID   clopidogrel   75 mg Oral Daily   donepezil   5 mg Oral QHS   insulin  aspart  0-9 Units Subcutaneous TID WC   levothyroxine   75 mcg Oral Q0600   polyethylene glycol  17 g Oral Daily   QUEtiapine   25 mg Oral QHS   [START ON 12/04/2023] sodium bicarbonate   650 mg Oral BID   sodium chloride  flush  3 mL Intravenous Once   Continuous Infusions:  sodium chloride  75 mL/hr at 12/03/23  1034    PRN Meds: acetaminophen  **OR** acetaminophen , albuterol , bisacodyl , hydrALAZINE  **OR** hydrALAZINE , liver oil-zinc  oxide, ondansetron  **OR** ondansetron  (ZOFRAN ) IV  Time spent: 40 minutes  Author: ELVAN SOR. MD [facility] 12/03/2023 4:05 PM  To reach On-call, see care teams to locate the attending and reach out to them via [email or web address]. If 7PM-7AM, please contact night-coverage If you still have difficulty reaching the attending provider, please page the [facility] (Director on Call) for [facility] on amion for assistance.

## 2023-12-03 NOTE — Progress Notes (Signed)
 Physical Therapy Treatment Patient Details Name: Victor Gould MRN: [id number] DOB: Apr 30, 1941 Today's Date: 12/03/2023   History of Present Illness Pt is an 83 y.o. male presented from home to the ED on 11/28/2023 with confusion and using inappropriate words and actions throughout the day. 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 PMH significant for CAD s/p CABG, HTN, afib, CKDIII, Type II DM, hypothyroidism, h/o prostate cancer, HLD, HF, h/o CVA, subdural hematoma (3 years prior), parkinson's disease.    PT Comments  Pt received semi reclined in bed agreeable to PT. Remains with AMS compared to when author completed eval on Friday. Only oriented to self, difficulty with expressing himself with his speech on occasion not answering questions appropriately and unintelligible speech but is then sometimes able to complete short sentences without issue. Does require minA for STS this date and use of RW with mod multi modal cuing and minA on RW for hallway navigation and safe use of DME. Does have significant diminished gait speed and endurance deficits for OOB mobility compared to eval but pt is aware and able to express fatigue. Pt returns to recliner with all needs in reach. D/c recs updated to < 3hours/day of PT due to AMS, and weakness as pt is a moderate to high falls risk. TOC updated.    If plan is discharge home, recommend the following: Assist for transportation;Assistance with cooking/housework;Supervision due to cognitive status;A little help with walking and/or transfers   Can travel by private vehicle     Yes  Equipment Recommendations  None recommended by PT    Recommendations for Other Services       Precautions / Restrictions Precautions Precautions: Fall Recall of Precautions/Restrictions: Impaired Restrictions Weight Bearing Restrictions Per Provider Order: No     Mobility  Bed Mobility Overal bed mobility: Modified  Independent Bed Mobility: Supine to Sit     Supine to sit: Modified independent (Device/Increase time)       Patient Response: Cooperative, Flat affect  Transfers Overall transfer level: Needs assistance   Transfers: Sit to/from Stand Sit to Stand: Min assist                Ambulation/Gait Ambulation/Gait assistance: Contact guard assist, Min assist Gait Distance (Feet): 180 Feet   Gait Pattern/deviations: Step-through pattern, Drifts right/left, Trunk flexed Gait velocity: 10' in 6.5 sec = 1.5'/sec Gait velocity interpretation: 1.31 - 2.62 ft/sec, indicative of limited community ambulator   General Gait Details: diminshed gait speed and need of RW compared to eval on Friday. Does require mod multimodal directional cues and minA on RW for sequencing.   Stairs             Wheelchair Mobility     Tilt Bed Tilt Bed Patient Response: Cooperative, Flat affect  Modified Rankin (Stroke Patients Only)       Balance Overall balance assessment: Needs assistance Sitting-balance support: Feet supported Sitting balance-Leahy Scale: Fair     Standing balance support: Reliant on assistive device for balance, During functional activity, Bilateral upper extremity supported, Single extremity supported Standing balance-Leahy Scale: Fair                              Hotel manager: Impaired Factors Affecting Communication: Hearing impaired  Cognition Arousal: Alert Behavior During Therapy: WFL for tasks assessed/performed   PT - Cognitive impairments: Orientation, Awareness, Memory, Attention, Safety/Judgement,  Problem solving                       PT - Cognition Comments: reliant on mod multi modal cuing for all functional mobility. Not oriented to time, place, or situation. Following commands: Impaired Following commands impaired: Follows one step commands inconsistently    Cueing Cueing Techniques: Verbal  cues, Gestural cues, Tactile cues, Visual cues  Exercises      General Comments        Pertinent Vitals/Pain Pain Assessment Pain Assessment: No/denies pain    Home Living                          Prior Function            PT Goals (current goals can now be found in the care plan section) Acute Rehab PT Goals Patient Stated Goal: to go home PT Goal Formulation: With patient Time For Goal Achievement: 12/13/23 Potential to Achieve Goals: Fair Progress towards PT goals: Not progressing toward goals - comment (AMS and weakness)    Frequency    Min 2X/week      PT Plan      Co-evaluation              AM-PAC PT 6 Clicks Mobility   Outcome Measure  Help needed turning from your back to your side while in a flat bed without using bedrails?: None Help needed moving from lying on your back to sitting on the side of a flat bed without using bedrails?: None Help needed moving to and from a bed to a chair (including a wheelchair)?: A Little Help needed standing up from a chair using your arms (e.g., wheelchair or bedside chair)?: A Little Help needed to walk in hospital room?: A Little Help needed climbing 3-5 steps with a railing? : A Little 6 Click Score: 20    End of Session Equipment Utilized During Treatment: Gait belt Activity Tolerance: Patient limited by fatigue Patient left: in chair;with call bell/phone within reach;with chair alarm set Nurse Communication: Mobility status PT Visit Diagnosis: Unsteadiness on feet (R26.81);History of falling (Z91.81)     Time: 9142-9082 PT Time Calculation (min) (ACUTE ONLY): 20 min  Charges:    $Therapeutic Activity: 8-22 mins PT General Charges $$ ACUTE PT VISIT: 1 Visit                    Dorina HERO. Fairly IV, PT, DPT Physical Therapist- [place]  [facility] 12/03/2023, 10:01 AM

## 2023-12-03 NOTE — Plan of Care (Signed)
 Patient remains free from any noted signs of acute distress.  Has not required any prn medications to assist with symptom management.  Rested throughout current shift, without any verbal outbursts, or signs of agitation.  Patient to continue to be monitored by hospital staff until discharge.

## 2023-12-03 NOTE — Plan of Care (Signed)
  Problem: Education: Goal: Ability to describe self-care measures that may prevent or decrease complications (Diabetes Survival Skills Education) will improve Outcome: Progressing Goal: Individualized Educational Video(s) Outcome: Progressing   Problem: Coping: Goal: Ability to adjust to condition or change in health will improve Outcome: Progressing   Problem: Fluid Volume: Goal: Ability to maintain a balanced intake and output will improve Outcome: Progressing   Problem: Health Behavior/Discharge Planning: Goal: Ability to identify and utilize available resources and services will improve Outcome: Progressing Goal: Ability to manage health-related needs will improve Outcome: Progressing   Problem: Metabolic: Goal: Ability to maintain appropriate glucose levels will improve Outcome: Progressing   Problem: Nutritional: Goal: Maintenance of adequate nutrition will improve Outcome: Progressing Goal: Progress toward achieving an optimal weight will improve Outcome: Progressing   Problem: Skin Integrity: Goal: Risk for impaired skin integrity will decrease Outcome: Progressing   Problem: Tissue Perfusion: Goal: Adequacy of tissue perfusion will improve Outcome: Progressing   Problem: Education: Goal: Knowledge of General Education information will improve Description: Including pain rating scale, medication(s)/side effects and non-pharmacologic comfort measures Outcome: Progressing   Problem: Health Behavior/Discharge Planning: Goal: Ability to manage health-related needs will improve Outcome: Progressing   Problem: Clinical Measurements: Goal: Ability to maintain clinical measurements within normal limits will improve Outcome: Progressing Goal: Will remain free from infection Outcome: Progressing Goal: Diagnostic test results will improve Outcome: Progressing Goal: Respiratory complications will improve Outcome: Progressing Goal: Cardiovascular complication will  be avoided Outcome: Progressing   Problem: Activity: Goal: Risk for activity intolerance will decrease Outcome: Progressing   Problem: Nutrition: Goal: Adequate nutrition will be maintained Outcome: Progressing   Problem: Coping: Goal: Level of anxiety will decrease Outcome: Progressing   Problem: Elimination: Goal: Will not experience complications related to bowel motility Outcome: Progressing Goal: Will not experience complications related to urinary retention Outcome: Progressing   Problem: Pain Managment: Goal: General experience of comfort will improve and/or be controlled Outcome: Progressing   Problem: Safety: Goal: Ability to remain free from injury will improve Outcome: Progressing   Problem: Skin Integrity: Goal: Risk for impaired skin integrity will decrease Outcome: Progressing   Problem: Education: Goal: Knowledge of disease or condition will improve Outcome: Progressing Goal: Knowledge of secondary prevention will improve (MUST DOCUMENT ALL) Outcome: Progressing Goal: Knowledge of patient specific risk factors will improve (DELETE if not current risk factor) Outcome: Progressing

## 2023-12-04 DIAGNOSIS — I639 Cerebral infarction, unspecified: Secondary | ICD-10-CM | POA: Diagnosis not present

## 2023-12-04 LAB — CBC
HCT: 38.4 % — ABNORMAL LOW (ref 39.0–52.0)
Hemoglobin: 12.5 g/dL — ABNORMAL LOW (ref 13.0–17.0)
MCH: 30.3 pg (ref 26.0–34.0)
MCHC: 32.6 g/dL (ref 30.0–36.0)
MCV: 93.2 fL (ref 80.0–100.0)
Platelets: 212 K/uL (ref 150–400)
RBC: 4.12 MIL/uL — ABNORMAL LOW (ref 4.22–5.81)
RDW: 13.5 % (ref 11.5–15.5)
WBC: 9 K/uL (ref 4.0–10.5)
nRBC: 0 % (ref 0.0–0.2)

## 2023-12-04 LAB — GLUCOSE, CAPILLARY
Glucose-Capillary: 131 mg/dL — ABNORMAL HIGH (ref 70–99)
Glucose-Capillary: 137 mg/dL — ABNORMAL HIGH (ref 70–99)
Glucose-Capillary: 142 mg/dL — ABNORMAL HIGH (ref 70–99)
Glucose-Capillary: 113 mg/dL — ABNORMAL HIGH (ref 70–99)
Glucose-Capillary: 166 mg/dL — ABNORMAL HIGH (ref 70–99)

## 2023-12-04 LAB — BASIC METABOLIC PANEL WITH GFR
Anion gap: 12 (ref 5–15)
BUN: 35 mg/dL — ABNORMAL HIGH (ref 8–23)
CO2: 18 mmol/L — ABNORMAL LOW (ref 22–32)
Calcium: 7 mg/dL — ABNORMAL LOW (ref 8.9–10.3)
Chloride: 110 mmol/L (ref 98–111)
Creatinine, Ser: 3.22 mg/dL — ABNORMAL HIGH (ref 0.61–1.24)
GFR, Estimated: 18 mL/min — ABNORMAL LOW (ref >60)
Glucose, Bld: 143 mg/dL — ABNORMAL HIGH (ref 70–99)
Potassium: 3.6 mmol/L (ref 3.5–5.1)
Sodium: 140 mmol/L (ref 135–145)

## 2023-12-04 LAB — MAGNESIUM: Magnesium: 2 mg/dL (ref 1.7–2.4)

## 2023-12-04 LAB — PHOSPHORUS: Phosphorus: 2.9 mg/dL (ref 2.5–4.6)

## 2023-12-04 MED ORDER — SODIUM BICARBONATE 650 MG PO TABS
650.0000 mg | ORAL_TABLET | Freq: Three times a day (TID) | ORAL | Status: DC
  Administered 2023-12-05 (×3): 650 mg via ORAL
  Filled 2023-12-04 (×5): qty 1

## 2023-12-04 MED ORDER — SODIUM CHLORIDE 0.9 % IV SOLN: INTRAVENOUS | Status: DC

## 2023-12-04 MED ORDER — SODIUM BICARBONATE 8.4 % IV SOLN
100.0000 meq | Freq: Once | INTRAVENOUS | Status: AC
  Administered 2023-12-04: 100 meq via INTRAVENOUS
  Filled 2023-12-04: qty 50
  Filled 2023-12-04: qty 100
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
## 2023-12-04 NOTE — TOC Progression Note (Addendum)
 Transition of Care [facility]) - Progression Note    Patient Details  Name: Victor Gould MRN: [id number] Date of Birth: 10/04/1940  [facility] CM/SW Contact  Dalia GORMAN Fuse, RN Phone Number: 12/04/2023, 2:46 PM  Clinical Narrative:     Patient sent out to facilities in [place] and [place], [place]. [facility] is considering can offer pending bed availability, semi-private available. TOC will continue to follow.    Bed offer for [facility] received. TOC spoke with the patient's wife and they would like to accept. TOC outreached to [name] at the facility to make her aware.  Expected Discharge Plan: Home w Home Health Services Barriers to Discharge: Continued Medical Work up               Expected Discharge Plan and Services   Discharge Planning Services: CM Consult Post Acute Care Choice: Home Health Living arrangements for the past 2 months: Single Family Home                           HH Arranged: PT, OT, RN [facility] Agency: [facility] Date [facility] Agency Contacted: 12/02/23 Time HH Agency Contacted: [id number] Representative spoke with at [facility] Agency: [place]   Social Drivers of Health (SDOH) Interventions SDOH Screenings   Food Insecurity: Patient Unable To Answer (11/29/2023)  Housing: Patient Unable To Answer (11/29/2023)  Transportation Needs: Unmet Transportation Needs (11/29/2023)  Utilities: Patient Unable To Answer (11/29/2023)  Financial Resource Strain: Low Risk  (11/11/2023)   Received from [facility]  Social Connections: Patient Unable To Answer (11/29/2023)  Tobacco Use: Low Risk  (11/11/2023)   Received from [facility]    Readmission Risk Interventions     No data to display

## 2023-12-04 NOTE — Plan of Care (Signed)
  Problem: Education: Goal: Ability to describe self-care measures that may prevent or decrease complications (Diabetes Survival Skills Education) will improve Outcome: Progressing   Problem: Coping: Goal: Ability to adjust to condition or change in health will improve Outcome: Progressing   Problem: Fluid Volume: Goal: Ability to maintain a balanced intake and output will improve Outcome: Progressing   Problem: Health Behavior/Discharge Planning: Goal: Ability to identify and utilize available resources and services will improve Outcome: Progressing   Problem: Metabolic: Goal: Ability to maintain appropriate glucose levels will improve Outcome: Progressing   Problem: Nutritional: Goal: Maintenance of adequate nutrition will improve Outcome: Progressing   Problem: Skin Integrity: Goal: Risk for impaired skin integrity will decrease Outcome: Progressing   Problem: Tissue Perfusion: Goal: Adequacy of tissue perfusion will improve Outcome: Progressing   Problem: Health Behavior/Discharge Planning: Goal: Ability to manage health-related needs will improve Outcome: Progressing   Problem: Clinical Measurements: Goal: Ability to maintain clinical measurements within normal limits will improve Outcome: Progressing   Problem: Activity: Goal: Risk for activity intolerance will decrease Outcome: Progressing   Problem: Nutrition: Goal: Adequate nutrition will be maintained Outcome: Progressing   Problem: Coping: Goal: Level of anxiety will decrease Outcome: Progressing   Problem: Pain Managment: Goal: General experience of comfort will improve and/or be controlled Outcome: Progressing   Problem: Skin Integrity: Goal: Risk for impaired skin integrity will decrease Outcome: Progressing

## 2023-12-04 NOTE — Progress Notes (Signed)
 [facility] Progress Note  Patient: Victor Gould    FMW:[id number]  DOA: 11/28/2023     Date of Service: the patient was seen and examined on 12/04/2023  Chief Complaint  Patient presents with   Altered Mental Status   Brief hospital course: Victor Gould is a 83 y.o. male with a PMH significant for CAD s/p CABG, HTN, afib, CKDIII, Type II DM, hypothyroidism, h/o prostate cancer, HLD, HF, h/o CVA, subdural hematoma (3 years prior), parkinson's disease. At baseline, they live with their wife and are independent with ADLs.   They presented from home to the ED on 11/28/2023 with confusion x1 days. Wife states that he has been using inappropriate words and actions throughout the day. He did not have any injury and he denies having any symptoms. He denies any focalized weakness or speech abnormalities. He endorses good adherence with his medications which includes aspirin , plavix , and eliquis .    ED w/up: VS significantly elevated blood pressure up to 216/84, mild bradycardia in high 50s. Otherwise stable.  Labs unremarkable CBC, glucose 252, Cr 3.43 ( roughly baseline) Brain MRI: 1. Small acute infarct in the left insula and many small acute infarcts in the overlying left parietal lobe (posterior left MCA territory). 2. Many remote infarcts, as detailed above. ( As shown in CT) 3. Chronic right vertebral artery occlusion.   They were initially treated with nothing.    Patient was admitted to medicine service for further workup and management of acute CVA as outlined in detail below.    Assessment and Plan:  # Acute CVA Patient presented with aphasia, AMS, no focal deficits MRI brain positive Neurology consulted, recommended to continue Eliquis  and Plavix  for CAD Carotid duplex negative for any significant stenosis TTE LVEF 50 to 55%, grade 1 diastolic dysfunction, no WMA, negative PFO. SLP eval done, patient is on regular diet PT and OT following, initially recommended  home health but due to mental status changes patient will require SNF placement. Follow TOC for SNF placement    # AKI on CKD stage IV Baseline sCr 2.64 which was 2 year ago  8/3 creatinine 3.43>>3.06>>3.17>>3.22 still elevated Encourage oral hydration Bladder scan negative for retention Monitor urine output and BMP daily 8/6 continue NS IVF 50 mL/h for gentle hydration 8/6 bladder scan ruled out urine retention   # Metabolic acidosis, CO2 19 8/5 sodium bicarb 100 mEq IV x 1 dose given 8/6 still low bicarb, 100 mEq IV x 1 dose given Started sodium bicarbonate  650 mg p.o. 3 times daily from tomorrow 8/7    # Hypophosphatemia, Phos repleted.  Resolved Monitor and replete as needed.  # HTN- severely elevated S/p permissive hypertension for 48 hours -8/5 increased amlodipine  5-->10 mg p.o. daily -8/5 increased bisoprolol  2.5-->5 mg p.o. daily - Hydralazine  IV vs po prn Monitor BP and titrate medications accordingly  # Type II DM - sSSI - A1c 7.4 well-controlled     # Parkinsons - continue home sinemet    # Hypothyroidism - continue home synthroid   # Sundowning and delirium with agitation 8/4 patient was given a dose of Haldol  the patient was more sleepy Started Seroquel  Avoid Haldol  use due to Parkinson's disease   Body mass index is 23.64 kg/m.  Interventions:  Diet: Regular diet DVT Prophylaxis: Eliquis   Advance goals of care discussion: Full code  Family Communication: family was present at bedside, at the time of interview.  The pt provided permission to discuss medical plan with the family. Opportunity  was given to ask question and all questions were answered satisfactorily.   Disposition:  Pt is from Home, admitted with Acute CVA, and AKI, on IVF,  which precludes a safe discharge. Discharge to SNF, when bed will be available.     Subjective: No significant events overnight, patient was awake but not alert or oriented.  Resting comfortably in the  bed, patient was getting ready to eat breakfast, wife was helping at bedside. Patient is still pleasantly confused, no agitation noticed.  Patient's wife cannot take care of him at home, plan is to send him to rehab.   Physical Exam: General: NAD, lying comfortably Appear in no distress, affect appropriate Eyes: PERRLA ENT: Oral Mucosa Clear, moist  Neck: no JVD,  Cardiovascular: S1 and S2 Present, no Murmur,  Respiratory: good respiratory effort, Bilateral Air entry equal and Decreased, no Crackles, no wheezes Abdomen: Bowel Sound present, Soft and no tenderness,  Skin: no rashes Extremities: no Pedal edema, no calf tenderness Neurologic: without any new focal findings Gait not checked due to patient safety concerns  Vitals:   12/03/23 2357 12/04/23 0347 12/04/23 0816 12/04/23 1223  BP: (!) 160/60 (!) 159/64 (!) 158/71 (!) 145/83  Pulse: 71 78 76 73  Resp:   16 15  Temp: 98.5 F (36.9 C) 99 F (37.2 C) 97.9 F (36.6 C) 98.6 F (37 C)  TempSrc: Axillary Oral Oral   SpO2: 96% 96% 95% 97%  Weight:        Intake/Output Summary (Last 24 hours) at 12/04/2023 1400 Last data filed at 12/04/2023 1123 Gross per 24 hour  Intake 468.74 ml  Output 375 ml  Net 93.74 ml    Filed Weights   11/28/23 1836  Weight: 72.6 kg    Data Reviewed: I have personally reviewed and interpreted daily labs, tele strips, imagings as discussed above. I reviewed all nursing notes, pharmacy notes, vitals, pertinent old records I have discussed plan of care as described above with RN and patient/family.  CBC: Recent Labs  Lab 11/28/23 1846 11/29/23 0521 11/30/23 1218 12/01/23 0455 12/02/23 0603 12/03/23 0455 12/04/23 0419  WBC 6.8   < > 7.1 7.6 8.8 8.4 9.0  NEUTROABS 4.5  --   --   --   --   --   --   HGB 13.1   < > 14.0 14.4 13.5 13.2 12.5*  HCT 40.8   < > 40.9 41.8 39.5 39.0 38.4*  MCV 93.2   < > 89.7 89.3 89.8 90.5 93.2  PLT 235   < > 228 227 222 219 212   < > = values in this interval  not displayed.   Basic Metabolic Panel: Recent Labs  Lab 11/30/23 1218 12/01/23 0455 12/02/23 0603 12/03/23 0455 12/04/23 0419  NA 139 139 137 139 140  K 4.0 3.8 3.9 3.8 3.6  CL 111 108 108 109 110  CO2 19* 20* 17* 19* 18*  GLUCOSE 142* 176* 189* 161* 143*  BUN 35* 38* 37* 38* 35*  CREATININE 3.06* 3.17* 3.37* 3.25* 3.22*  CALCIUM  8.4* 8.2* 7.5* 7.5* 7.0*  MG 2.2 2.1 2.0 2.1 2.0  PHOS 2.0* 4.5 3.1 2.9 2.9    Studies: No results found.   Scheduled Meds:  amLODipine   10 mg Oral Daily   apixaban   2.5 mg Oral BID   atorvastatin   40 mg Oral Daily   bisoprolol   5 mg Oral Daily   carbidopa -levodopa   1 tablet Oral TID   clopidogrel   75 mg  Oral Daily   donepezil   5 mg Oral QHS   insulin  aspart  0-9 Units Subcutaneous TID WC   levothyroxine   75 mcg Oral Q0600   polyethylene glycol  17 g Oral Daily   QUEtiapine   25 mg Oral QHS   [START ON 12/05/2023] sodium bicarbonate   650 mg Oral TID   sodium chloride  flush  3 mL Intravenous Once   Continuous Infusions:    PRN Meds: acetaminophen  **OR** acetaminophen , albuterol , bisacodyl , hydrALAZINE  **OR** hydrALAZINE , liver oil-zinc  oxide, ondansetron  **OR** ondansetron  (ZOFRAN ) IV  Time spent: 40 minutes  Author: ELVAN SOR. MD [facility] 12/04/2023 2:00 PM  To reach On-call, see care teams to locate the attending and reach out to them via [email or web address]. If 7PM-7AM, please contact night-coverage If you still have difficulty reaching the attending provider, please page the [facility] (Director on Call) for [facility] on amion for assistance.

## 2023-12-05 ENCOUNTER — Inpatient Hospital Stay

## 2023-12-05 DIAGNOSIS — I639 Cerebral infarction, unspecified: Secondary | ICD-10-CM | POA: Diagnosis not present

## 2023-12-05 LAB — CBC
HCT: 40.8 % (ref 39.0–52.0)
Hemoglobin: 13.6 g/dL (ref 13.0–17.0)
MCH: 30.7 pg (ref 26.0–34.0)
MCHC: 33.3 g/dL (ref 30.0–36.0)
MCV: 92.1 fL (ref 80.0–100.0)
Platelets: 212 K/uL (ref 150–400)
RBC: 4.43 MIL/uL (ref 4.22–5.81)
RDW: 13.6 % (ref 11.5–15.5)
WBC: 7.1 K/uL (ref 4.0–10.5)
nRBC: 0 % (ref 0.0–0.2)

## 2023-12-05 LAB — MAGNESIUM: Magnesium: 2 mg/dL (ref 1.7–2.4)

## 2023-12-05 LAB — BASIC METABOLIC PANEL WITH GFR
Anion gap: 12 (ref 5–15)
BUN: 38 mg/dL — ABNORMAL HIGH (ref 8–23)
CO2: 18 mmol/L — ABNORMAL LOW (ref 22–32)
Calcium: 6.9 mg/dL — ABNORMAL LOW (ref 8.9–10.3)
Chloride: 111 mmol/L (ref 98–111)
Creatinine, Ser: 3.22 mg/dL — ABNORMAL HIGH (ref 0.61–1.24)
GFR, Estimated: 18 mL/min — ABNORMAL LOW (ref >60)
Glucose, Bld: 145 mg/dL — ABNORMAL HIGH (ref 70–99)
Potassium: 3.5 mmol/L (ref 3.5–5.1)
Sodium: 141 mmol/L (ref 135–145)

## 2023-12-05 LAB — GLUCOSE, CAPILLARY
Glucose-Capillary: 136 mg/dL — ABNORMAL HIGH (ref 70–99)
Glucose-Capillary: 122 mg/dL — ABNORMAL HIGH (ref 70–99)
Glucose-Capillary: 142 mg/dL — ABNORMAL HIGH (ref 70–99)

## 2023-12-05 LAB — PHOSPHORUS: Phosphorus: 2.6 mg/dL (ref 2.5–4.6)

## 2023-12-05 MED ORDER — LOSARTAN POTASSIUM 50 MG PO TABS
50.0000 mg | ORAL_TABLET | Freq: Every day | ORAL | Status: DC
  Administered 2023-12-05 – 2023-12-08 (×3): 50 mg via ORAL
  Filled 2023-12-05 (×5): qty 1

## 2023-12-05 MED ORDER — LOPERAMIDE HCL 2 MG PO CAPS
2.0000 mg | ORAL_CAPSULE | ORAL | Status: DC | PRN
  Administered 2023-12-05: 2 mg via ORAL
  Filled 2023-12-05: qty 1

## 2023-12-05 MED ORDER — SODIUM BICARBONATE 8.4 % IV SOLN
100.0000 meq | Freq: Once | INTRAVENOUS | Status: AC
  Administered 2023-12-05: 100 meq via INTRAVENOUS
  Filled 2023-12-05: qty 100

## 2023-12-05 MED ORDER — SACCHAROMYCES BOULARDII 250 MG PO CAPS
250.0000 mg | ORAL_CAPSULE | Freq: Two times a day (BID) | ORAL | Status: DC
  Administered 2023-12-05 – 2023-12-14 (×17): 250 mg via ORAL
  Filled 2023-12-05 (×19): qty 1

## 2023-12-05 MED ORDER — SODIUM CHLORIDE 0.9 % IV SOLN: INTRAVENOUS | Status: DC

## 2023-12-05 NOTE — Progress Notes (Signed)
 [facility] Progress Note  Patient: Victor Gould    FMW:[id number]  DOA: 11/28/2023     Date of Service: the patient was seen and examined on 12/05/2023  Chief Complaint  Patient presents with   Altered Mental Status   Brief hospital course: SHI BLANKENSHIP is a 83 y.o. male with a PMH significant for CAD s/p CABG, HTN, afib, CKDIII, Type II DM, hypothyroidism, h/o prostate cancer, HLD, HF, h/o CVA, subdural hematoma (3 years prior), parkinson's disease. At baseline, they live with their wife and are independent with ADLs.   They presented from home to the ED on 11/28/2023 with confusion x1 days. Wife states that he has been using inappropriate words and actions throughout the day. He did not have any injury and he denies having any symptoms. He denies any focalized weakness or speech abnormalities. He endorses good adherence with his medications which includes aspirin , plavix , and eliquis .    ED w/up: VS significantly elevated blood pressure up to 216/84, mild bradycardia in high 50s. Otherwise stable.  Labs unremarkable CBC, glucose 252, Cr 3.43 ( roughly baseline) Brain MRI: 1. Small acute infarct in the left insula and many small acute infarcts in the overlying left parietal lobe (posterior left MCA territory). 2. Many remote infarcts, as detailed above. ( As shown in CT) 3. Chronic right vertebral artery occlusion.   They were initially treated with nothing.    Patient was admitted to medicine service for further workup and management of acute CVA as outlined in detail below.    Assessment and Plan:  # Acute CVA Patient presented with aphasia, AMS, no focal deficits MRI brain positive Neurology consulted, recommended to continue Eliquis  and Plavix  for CAD Carotid duplex negative for any significant stenosis TTE LVEF 50 to 55%, grade 1 diastolic dysfunction, no WMA, negative PFO. SLP eval done, patient is on regular diet PT and OT following, initially recommended  home health but due to mental status changes patient will require SNF placement. Follow TOC for SNF placement    # AKI on CKD stage IV Baseline sCr 2.64 which was 2 year ago  8/3 creatinine 3.43>>3.06>>3.17>>3.22 still elevated Encourage oral hydration Bladder scan negative for retention Monitor urine output and BMP daily 8/6 bladder scan ruled out urine retention 8/7 continue NS IVF 50 mL/h for gentle hydration  # Metabolic acidosis: 8/5 sodium bicarb 100 mEq IV x 1 dose given 8/6 still low bicarb, 100 mEq IV x 1 dose given 8/7 still low bicarb, 100 mEq IV x 1 dose given and Started sodium bicarbonate  650 mg p.o. 3 times daily Check BMP daily   # Hypophosphatemia, Phos repleted.  Resolved Monitor and replete as needed.  # HTN- severely elevated S/p permissive hypertension for 48 hours -8/5 increased amlodipine  5-->10 mg p.o. daily -8/5 increased bisoprolol  2.5-->5 mg p.o. daily - Hydralazine  IV vs po prn Monitor BP and titrate medications accordingly  # Type II DM - sSSI - A1c 7.4 well-controlled     # Parkinsons - continue home sinemet    # Hypothyroidism - continue home synthroid   # Sundowning and delirium with agitation 8/4 patient was given a dose of Haldol  the patient was more sleepy Started Seroquel  Avoid Haldol  use due to Parkinson's disease  # Diarrhea, as per his wife that patient has diarrhea since admission but no one informed me. X-ray abdomen did not show any acute findings Started probiotics Imodium  order placed.  Less likely infection.  WBC within normal range, afebrile.  Body mass index is 23.64 kg/m.  Interventions:  Diet: Regular diet DVT Prophylaxis: Eliquis   Advance goals of care discussion: Full code  Family Communication: family was present at bedside, at the time of interview.  The pt provided permission to discuss medical plan with the family. Opportunity was given to ask question and all questions were answered satisfactorily.    Disposition:  Pt is from Home, admitted with Acute CVA, and AKI, on IVF,  which precludes a safe discharge. Discharge to SNF, when bed will be available.     Subjective: No significant events overnight, patient remains pleasantly confused, no new acute changes. As per patient's wife patient is having diarrhea since admission, no one informed me. We will continue to manage as above. Bicarbonate IV and oral started. Continue to monitor today and plan for disposition tomorrow a.m.   Physical Exam: General: NAD, lying comfortably Appear in no distress, affect appropriate Eyes: PERRLA ENT: Oral Mucosa Clear, moist  Neck: no JVD,  Cardiovascular: S1 and S2 Present, no Murmur,  Respiratory: good respiratory effort, Bilateral Air entry equal and Decreased, no Crackles, no wheezes Abdomen: Bowel Sound present, Soft and no tenderness,  Skin: no rashes Extremities: no Pedal edema, no calf tenderness Neurologic: without any new focal findings Gait not checked due to patient safety concerns  Vitals:   12/05/23 0024 12/05/23 0130 12/05/23 0451 12/05/23 0818  BP: (!) 121/37 (!) 166/65 (!) 148/45 (!) 175/75  Pulse: 76 72 71 71  Resp:    16  Temp: 98.4 F (36.9 C)  99 F (37.2 C) 97.8 F (36.6 C)  TempSrc: Oral  Oral Oral  SpO2: 93%  95% 95%  Weight:        Intake/Output Summary (Last 24 hours) at 12/05/2023 1107 Last data filed at 12/04/2023 1300 Gross per 24 hour  Intake 0 ml  Output 375 ml  Net -375 ml    Filed Weights   11/28/23 1836  Weight: 72.6 kg    Data Reviewed: I have personally reviewed and interpreted daily labs, tele strips, imagings as discussed above. I reviewed all nursing notes, pharmacy notes, vitals, pertinent old records I have discussed plan of care as described above with RN and patient/family.  CBC: Recent Labs  Lab 11/28/23 1846 11/29/23 0521 12/01/23 0455 12/02/23 0603 12/03/23 0455 12/04/23 0419 12/05/23 0409  WBC 6.8   < > 7.6 8.8 8.4 9.0  7.1  NEUTROABS 4.5  --   --   --   --   --   --   HGB 13.1   < > 14.4 13.5 13.2 12.5* 13.6  HCT 40.8   < > 41.8 39.5 39.0 38.4* 40.8  MCV 93.2   < > 89.3 89.8 90.5 93.2 92.1  PLT 235   < > 227 222 219 212 212   < > = values in this interval not displayed.   Basic Metabolic Panel: Recent Labs  Lab 12/01/23 0455 12/02/23 0603 12/03/23 0455 12/04/23 0419 12/05/23 0409  NA 139 137 139 140 141  K 3.8 3.9 3.8 3.6 3.5  CL 108 108 109 110 111  CO2 20* 17* 19* 18* 18*  GLUCOSE 176* 189* 161* 143* 145*  BUN 38* 37* 38* 35* 38*  CREATININE 3.17* 3.37* 3.25* 3.22* 3.22*  CALCIUM  8.2* 7.5* 7.5* 7.0* 6.9*  MG 2.1 2.0 2.1 2.0 2.0  PHOS 4.5 3.1 2.9 2.9 2.6    Studies: No results found.   Scheduled Meds:  amLODipine   10 mg Oral  Daily   apixaban   2.5 mg Oral BID   atorvastatin   40 mg Oral Daily   bisoprolol   5 mg Oral Daily   carbidopa -levodopa   1 tablet Oral TID   clopidogrel   75 mg Oral Daily   donepezil   5 mg Oral QHS   insulin  aspart  0-9 Units Subcutaneous TID WC   levothyroxine   75 mcg Oral Q0600   losartan   50 mg Oral Daily   polyethylene glycol  17 g Oral Daily   QUEtiapine   25 mg Oral QHS   saccharomyces boulardii  250 mg Oral BID   sodium bicarbonate   650 mg Oral TID   sodium chloride  flush  3 mL Intravenous Once   Continuous Infusions:  sodium chloride  50 mL/hr at 12/05/23 0834     PRN Meds: acetaminophen  **OR** acetaminophen , albuterol , bisacodyl , hydrALAZINE  **OR** hydrALAZINE , liver oil-zinc  oxide, ondansetron  **OR** ondansetron  (ZOFRAN ) IV  Time spent: 40 minutes  Author: ELVAN SOR. MD [facility] 12/05/2023 11:07 AM  To reach On-call, see care teams to locate the attending and reach out to them via [email or web address]. If 7PM-7AM, please contact night-coverage If you still have difficulty reaching the attending provider, please page the [facility] (Director on Call) for [facility] on amion for assistance.

## 2023-12-05 NOTE — Progress Notes (Signed)
 Per Dr Von, dc [facility] stroke order

## 2023-12-05 NOTE — Plan of Care (Signed)
  Problem: Coping: Goal: Ability to adjust to condition or change in health will improve Outcome: Progressing   Problem: Fluid Volume: Goal: Ability to maintain a balanced intake and output will improve Outcome: Progressing   Problem: Health Behavior/Discharge Planning: Goal: Ability to identify and utilize available resources and services will improve Outcome: Progressing Goal: Ability to manage health-related needs will improve Outcome: Progressing   Problem: Nutritional: Goal: Maintenance of adequate nutrition will improve Outcome: Progressing   Problem: Skin Integrity: Goal: Risk for impaired skin integrity will decrease Outcome: Progressing   Problem: Education: Goal: Knowledge of General Education information will improve Description: Including pain rating scale, medication(s)/side effects and non-pharmacologic comfort measures Outcome: Progressing   Problem: Clinical Measurements: Goal: Will remain free from infection Outcome: Progressing   Problem: Activity: Goal: Risk for activity intolerance will decrease Outcome: Progressing   Problem: Elimination: Goal: Will not experience complications related to bowel motility Outcome: Progressing   Problem: Pain Managment: Goal: General experience of comfort will improve and/or be controlled Outcome: Progressing

## 2023-12-05 NOTE — Progress Notes (Signed)
 Physical Therapy Treatment Patient Details Name: Victor Gould MRN: [id number] DOB: 1941-01-16 Today's Date: 12/05/2023   History of Present Illness Pt is an 83 y.o. male presented from home to the ED on 11/28/2023 with confusion and using inappropriate words and actions throughout the day. 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 PMH significant for CAD s/p CABG, HTN, afib, CKDIII, Type II DM, hypothyroidism, h/o prostate cancer, HLD, HF, h/o CVA, subdural hematoma (3 years prior), parkinson's disease.    PT Comments  Patient is agreeable to PT session. Supportive spouse present. Patient unsteady with standing, requiring Mod A for standing from bed and Min A from toilet. Short distance ambulation with Min A for steadying using rolling walker. The patient is not at his baseline level of functional independence. Recommend to continue PT to maximize independence and facilitate return to prior level of function.    If plan is discharge home, recommend the following: Assist for transportation;Assistance with cooking/housework;Supervision due to cognitive status;A little help with walking and/or transfers   Can travel by private vehicle     Yes  Equipment Recommendations  None recommended by PT    Recommendations for Other Services       Precautions / Restrictions Precautions Precautions: Fall Recall of Precautions/Restrictions: Impaired Restrictions Weight Bearing Restrictions Per Provider Order: No     Mobility  Bed Mobility Overal bed mobility: Modified Independent Bed Mobility: Supine to Sit     Supine to sit: Min assist     General bed mobility comments: assistance for trunk support to sit upright    Transfers Overall transfer level: Needs assistance Equipment used: Rolling walker (2 wheels) Transfers: Sit to/from Stand Sit to Stand: Mod assist, Min assist           General transfer comment: Mod A for standing  from bed, Min A for standing from toilet. cues for initiation    Ambulation/Gait Ambulation/Gait assistance: Contact guard assist, Min assist Gait Distance (Feet): 30 Feet Assistive device: Rolling walker (2 wheels) Gait Pattern/deviations: Shuffle, Decreased stride length, Trunk flexed Gait velocity: decreased     General Gait Details: unsteady with ambulation with steadying assistance provided for safety. patient declined walking in hallway   Stairs             Wheelchair Mobility     Tilt Bed    Modified Rankin (Stroke Patients Only)       Balance Overall balance assessment: Needs assistance Sitting-balance support: Feet supported Sitting balance-Leahy Scale: Fair     Standing balance support: During functional activity, Bilateral upper extremity supported, Reliant on assistive device for balance Standing balance-Leahy Scale: Poor Standing balance comment: shaky initially with standing, external support required with rolling walker for support                            Communication Communication Communication: Impaired Factors Affecting Communication: Hearing impaired  Cognition Arousal: Alert Behavior During Therapy: WFL for tasks assessed/performed   PT - Cognitive impairments: Orientation, Awareness, Memory, Attention, Safety/Judgement, Problem solving                       PT - Cognition Comments: difficult to assess at times due to expressive language deficits Following commands: Impaired Following commands impaired: Follows one step commands inconsistently    Cueing Cueing Techniques: Verbal cues  Exercises  General Comments        Pertinent Vitals/Pain Pain Assessment Pain Assessment: No/denies pain    Home Living                          Prior Function            PT Goals (current goals can now be found in the care plan section) Acute Rehab PT Goals Patient Stated Goal: to go home PT Goal  Formulation: With patient Time For Goal Achievement: 12/13/23 Potential to Achieve Goals: Fair Progress towards PT goals: Progressing toward goals    Frequency    Min 2X/week      PT Plan      Co-evaluation              AM-PAC PT 6 Clicks Mobility   Outcome Measure  Help needed turning from your back to your side while in a flat bed without using bedrails?: None Help needed moving from lying on your back to sitting on the side of a flat bed without using bedrails?: None Help needed moving to and from a bed to a chair (including a wheelchair)?: A Little Help needed standing up from a chair using your arms (e.g., wheelchair or bedside chair)?: A Little Help needed to walk in hospital room?: A Little Help needed climbing 3-5 steps with a railing? : A Little 6 Click Score: 20    End of Session   Activity Tolerance: Patient tolerated treatment well Patient left: in chair;with call bell/phone within reach Nurse Communication: Mobility status PT Visit Diagnosis: Unsteadiness on feet (R26.81);History of falling (Z91.81)     Time: 8863-8795 PT Time Calculation (min) (ACUTE ONLY): 28 min  Charges:    $Therapeutic Activity: 23-37 mins PT General Charges $$ ACUTE PT VISIT: 1 Visit                    Randine Essex, PT, MPT    Randine LULLA Essex 12/05/2023, 12:49 PM

## 2023-12-06 DIAGNOSIS — I639 Cerebral infarction, unspecified: Secondary | ICD-10-CM | POA: Diagnosis not present

## 2023-12-06 LAB — BASIC METABOLIC PANEL WITH GFR
Anion gap: 19 — ABNORMAL HIGH (ref 5–15)
BUN: 36 mg/dL — ABNORMAL HIGH (ref 8–23)
CO2: 17 mmol/L — ABNORMAL LOW (ref 22–32)
Calcium: 7.1 mg/dL — ABNORMAL LOW (ref 8.9–10.3)
Chloride: 108 mmol/L (ref 98–111)
Creatinine, Ser: 3.42 mg/dL — ABNORMAL HIGH (ref 0.61–1.24)
GFR, Estimated: 17 mL/min — ABNORMAL LOW (ref >60)
Glucose, Bld: 184 mg/dL — ABNORMAL HIGH (ref 70–99)
Potassium: 3.7 mmol/L (ref 3.5–5.1)
Sodium: 144 mmol/L (ref 135–145)

## 2023-12-06 LAB — CBC
HCT: 43.3 % (ref 39.0–52.0)
Hemoglobin: 14.2 g/dL (ref 13.0–17.0)
MCH: 30.9 pg (ref 26.0–34.0)
MCHC: 32.8 g/dL (ref 30.0–36.0)
MCV: 94.1 fL (ref 80.0–100.0)
Platelets: 245 K/uL (ref 150–400)
RBC: 4.6 MIL/uL (ref 4.22–5.81)
RDW: 13.6 % (ref 11.5–15.5)
WBC: 12.3 K/uL — ABNORMAL HIGH (ref 4.0–10.5)
nRBC: 0 % (ref 0.0–0.2)

## 2023-12-06 LAB — MAGNESIUM: Magnesium: 1.9 mg/dL (ref 1.7–2.4)

## 2023-12-06 LAB — GLUCOSE, CAPILLARY
Glucose-Capillary: 174 mg/dL — ABNORMAL HIGH (ref 70–99)
Glucose-Capillary: 170 mg/dL — ABNORMAL HIGH (ref 70–99)
Glucose-Capillary: 157 mg/dL — ABNORMAL HIGH (ref 70–99)
Glucose-Capillary: 130 mg/dL — ABNORMAL HIGH (ref 70–99)

## 2023-12-06 LAB — PHOSPHORUS: Phosphorus: 2.7 mg/dL (ref 2.5–4.6)

## 2023-12-06 MED ORDER — SODIUM CHLORIDE 0.9 % IV SOLN: INTRAVENOUS | Status: DC

## 2023-12-06 MED ORDER — SODIUM BICARBONATE 8.4 % IV SOLN
100.0000 meq | Freq: Once | INTRAVENOUS | Status: AC
  Administered 2023-12-06: 100 meq via INTRAVENOUS
  Filled 2023-12-06: qty 50

## 2023-12-06 NOTE — Plan of Care (Signed)

## 2023-12-06 NOTE — Progress Notes (Addendum)
 [facility] Progress Note  Patient: Victor Gould    FMW:[id number]  DOA: 11/28/2023     Date of Service: the patient was seen and examined on 12/06/2023  Chief Complaint  Patient presents with   Altered Mental Status   Brief hospital course: Victor Gould is a 83 y.o. male with a PMH significant for CAD s/p CABG, HTN, afib, CKDIII, Type II DM, hypothyroidism, h/o prostate cancer, HLD, HF, h/o CVA, subdural hematoma (3 years prior), parkinson's disease. At baseline, they live with their wife and are independent with ADLs.   They presented from home to the ED on 11/28/2023 with confusion x1 days. Wife states that he has been using inappropriate words and actions throughout the day. He did not have any injury and he denies having any symptoms. He denies any focalized weakness or speech abnormalities. He endorses good adherence with his medications which includes aspirin , plavix , and eliquis .    ED w/up: VS significantly elevated blood pressure up to 216/84, mild bradycardia in high 50s. Otherwise stable.  Labs unremarkable CBC, glucose 252, Cr 3.43 ( roughly baseline) Brain MRI: 1. Small acute infarct in the left insula and many small acute infarcts in the overlying left parietal lobe (posterior left MCA territory). 2. Many remote infarcts, as detailed above. ( As shown in CT) 3. Chronic right vertebral artery occlusion.   They were initially treated with nothing.    Patient was admitted to medicine service for further workup and management of acute CVA as outlined in detail below.    Assessment and Plan:  # Acute CVA Patient presented with aphasia, AMS, no focal deficits MRI brain positive Neurology consulted, recommended to continue Eliquis  and Plavix  for CAD Carotid duplex negative for any significant stenosis TTE LVEF 50 to 55%, grade 1 diastolic dysfunction, no WMA, negative PFO. SLP eval done, patient is on regular diet PT and OT following, initially recommended  home health but due to mental status changes patient will require SNF placement. Follow TOC for SNF placement    # AKI on CKD stage IV Baseline sCr 2.64 which was 2 year ago  8/3 creatinine 3.43>>3.06>>3.17>>3.22 still elevated Encourage oral hydration Bladder scan negative for retention Monitor urine output and BMP daily 8/6 bladder scan ruled out urine retention 8/7 continue NS IVF 50 mL/h for gentle hydration  # Metabolic acidosis: 8/5 sodium bicarb 100 mEq IV x 1 dose given 8/6 still low bicarb, 100 mEq IV x 1 dose given 8/7 still low bicarb, 100 mEq IV x 1 dose given and Started sodium bicarbonate  650 mg p.o. 3 times daily 8/8 bicarb 100 mEq IV x 1 dose given Check BMP daily   # Hypophosphatemia, Phos repleted.  Resolved Monitor and replete as needed.  # HTN- severely elevated S/p permissive hypertension for 48 hours -8/5 increased amlodipine  5-->10 mg p.o. daily -8/5 increased bisoprolol  2.5-->5 mg p.o. 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  Most of the time sleepy, did not sleep last night well. 8/8 discussed with neurology, recommended no further imaging.  Continue  to monitor.   # Diarrhea, as per his wife that patient has diarrhea since admission but no one informed me. X-ray abdomen did not show any acute findings Started probiotics Imodium  order placed.  Less likely infection.  WBC within normal range, afebrile. 8/8 no diarrhea today as per RN  Body mass index is 23.64 kg/m.  Interventions:  Diet: Regular diet DVT  Prophylaxis: Eliquis   Advance goals of care discussion: Full code  Family Communication: family was present at bedside, at the time of interview.  8/8 discussed with patient's wife at bedside.  Disposition:  Pt is from Home, admitted with Acute CVA, and AKI, on IVF,  which precludes a safe discharge. Discharge to SNF, when bed will be available.     Subjective: No significant events overnight, patient was very confused and refusing oral medications. Patient was sleepy, does not want any bothering and he is asking everyone to leave the room. Patient wife was concerned due to acute change in the mental status.  As per her he was alert at nighttime and his night and daytime has been flipped.  Patient thinks that this is nighttime now so he is sleepy. Discussed with neurology, recommended no need of imaging at this time.  We will continue to monitor.  Informed patient's wife.   Physical Exam: General: NAD, lying comfortably Appear in no distress, affect appropriate Eyes: PERRLA ENT: Oral Mucosa Clear, moist  Neck: no JVD,  Cardiovascular: S1 and S2 Present, no Murmur,  Respiratory: good respiratory effort, Bilateral Air entry equal and Decreased, no Crackles, no wheezes Abdomen: Bowel Sound present, Soft and no tenderness,  Skin: no rashes Extremities: no Pedal edema, no calf tenderness Neurologic: without any new focal findings Gait not checked due to patient safety concerns  Vitals:   12/06/23 0449 12/06/23 0832 12/06/23 1207 12/06/23 1644  BP: (!) 171/78 (!) 174/71 (!) 168/72 (!) 158/73  Pulse: 85 77 81 88  Resp: 17 16 16 16   Temp: 98.7 F (37.1 C) 98.5 F (36.9 C) 99.3 F (37.4 C) 98.1 F (36.7 C)  TempSrc: Oral     SpO2: 95% 96% 93% 94%  Weight:        Intake/Output Summary (Last 24 hours) at 12/06/2023 1717 Last data filed at 12/06/2023 1300 Gross per 24 hour  Intake 0 ml  Output --  Net 0 ml    Filed Weights   11/28/23 1836  Weight: 72.6 kg    Data  Reviewed: I have personally reviewed and interpreted daily labs, tele strips, imagings as discussed above. I reviewed all nursing notes, pharmacy notes, vitals, pertinent old records I have discussed plan of care as described above with RN and patient/family.  CBC: Recent Labs  Lab 12/02/23 0603 12/03/23 0455 12/04/23 0419 12/05/23 0409 12/06/23 0443  WBC 8.8 8.4 9.0 7.1 12.3*  HGB 13.5 13.2 12.5* 13.6 14.2  HCT 39.5 39.0 38.4* 40.8 43.3  MCV 89.8 90.5 93.2 92.1 94.1  PLT 222 219 212 212 245   Basic Metabolic Panel: Recent Labs  Lab 12/02/23 0603 12/03/23 0455 12/04/23 0419 12/05/23 0409 12/06/23 0443  NA 137 139 140 141 144  K 3.9 3.8 3.6 3.5 3.7  CL 108 109 110 111 108  CO2 17* 19* 18* 18* 17*  GLUCOSE 189* 161* 143* 145* 184*  BUN 37* 38* 35* 38* 36*  CREATININE 3.37* 3.25* 3.22* 3.22* 3.42*  CALCIUM  7.5* 7.5* 7.0* 6.9* 7.1*  MG 2.0 2.1 2.0 2.0 1.9  PHOS 3.1 2.9 2.9  2.6 2.7    Studies: No results found.   Scheduled Meds:  amLODipine   10 mg Oral Daily   apixaban   2.5 mg Oral BID   atorvastatin   40 mg Oral Daily   bisoprolol   5 mg Oral Daily   carbidopa -levodopa   1 tablet Oral TID   clopidogrel   75 mg Oral Daily   donepezil   5 mg Oral QHS   insulin  aspart  0-9 Units Subcutaneous TID WC   levothyroxine   75 mcg Oral Q0600   losartan   50 mg Oral Daily   QUEtiapine   25 mg Oral QHS   saccharomyces boulardii  250 mg Oral BID   sodium bicarbonate   650 mg Oral TID   sodium chloride  flush  3 mL Intravenous Once   Continuous Infusions:  sodium chloride  50 mL/hr at 12/06/23 1125     PRN Meds: acetaminophen  **OR** acetaminophen , albuterol , bisacodyl , hydrALAZINE  **OR** hydrALAZINE , liver oil-zinc  oxide, loperamide , ondansetron  **OR** ondansetron  (ZOFRAN ) IV  Time spent: 40 minutes  Author: ELVAN SOR. MD [facility] 12/06/2023 5:17 PM  To reach On-call, see care teams to locate the attending and reach out to them via [email or web address]. If 7PM-7AM,  please contact night-coverage If you still have difficulty reaching the attending provider, please page the [facility] (Director on Call) for [facility] on amion for assistance.

## 2023-12-06 NOTE — Progress Notes (Signed)
 Patient is refusing to take medication. Dr. Von notified.

## 2023-12-06 NOTE — Progress Notes (Signed)
 S: Wife complains of derangement in the patient's sleep-wake cycle. He is now sleeping during the day and awake at night.   O: BP (!) 158/73 (BP Location: Right Arm)   Pulse 88   Temp 98.1 F (36.7 C)   Resp 16   Wt 72.6 kg   SpO2 94%   BMI 23.64 kg/m   Exam: Lying asleep in bed, in NAD. He does not awaken in response to extended conversation between MD and patient's wife.   Assessment/Recommendations: 83 y.o. male with a PMHx of CAD s/p CABG, HTN, a fib on eliquis , CKDIII, DM2, hypothyroidism, f/o prostate cancer, HL,HF, h/o CVA, subdural hematoma, mild cognitive impairment, Parkinson's disease who presented with aphasia. MRI revealed a small acute infarct in the left insula and many small acute infarcts in the overlying left parietal lobe. Stroke work up has been completed with recommendations as per my prior notes.  - Will need to reset his circadian cycle gradually.  - Lights on and OOB to chair during the day.  - Lights and TV off and in bed at night.  - Advised no use of computers, ipads, cellphones or televisions as activities when trying to get back to sleep. A better alternative is reading a large-type book or using manipulatives such as puzzles in a dimly lit room or lamp with yellow light.  - No caffeine within 6 hours of bedtime - Mid-day nap can be helpful, but for no longer than 1 hour.  - Drink one cup of lactose-free prior to bedtime, which can help with sleep maintenance (regular mild also helpful, but he is lactose-intolerant) - Light exercise at least once per day, for 30 minutes if tolerated, can also help with sleep initiation and maintenance.   Electronically signed: Dr. Tamia Dial

## 2023-12-06 NOTE — Progress Notes (Signed)
 Physical Therapy Treatment Patient Details Name: Victor Gould MRN: [id number] DOB: 10/01/1940 Today's Date: 12/06/2023   History of Present Illness Pt is an 83 y.o. male presented from home to the ED on 11/28/2023 with confusion and using inappropriate words and actions throughout the day. 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 PMH significant for CAD s/p CABG, HTN, afib, CKDIII, Type II DM, hypothyroidism, h/o prostate cancer, HLD, HF, h/o CVA, subdural hematoma (3 years prior), parkinson's disease.    PT Comments  Upon entering the room, pt was received in bed and sleeping. Pt was lethargic throughout session and required assistance with toileting. Pt performed multiple sit to stands throughout PT session from the EOB and at the [facility] and required min assistance for hand placement and sequencing. Pt demonstrated ability to ambulate from the bed into the bathroom with min A for sequencing and walker management. Pt required min to mod assistance with supine to sit at EOB, and max A for rolling d/t inability to initiate the movement.    If plan is discharge home, recommend the following: A little help with walking and/or transfers;A little help with bathing/dressing/bathroom;Assistance with cooking/housework;Assist for transportation;Help with stairs or ramp for entrance;Supervision due to cognitive status   Can travel by private vehicle     Yes  Equipment Recommendations  None recommended by PT    Recommendations for Other Services       Precautions / Restrictions Precautions Precautions: Fall Recall of Precautions/Restrictions: Impaired Restrictions Weight Bearing Restrictions Per Provider Order: No     Mobility  Bed Mobility Overal bed mobility: Needs Assistance Bed Mobility: Rolling, Supine to Sit, Sit to Supine Rolling: Max assist   Supine to sit: Min assist, Mod assist Sit to supine: Mod assist   General bed mobility  comments: Assistance to bring LEs up into the bed. max A for rolling, as patient was unable to initate/sequence the movement.    Transfers Overall transfer level: Needs assistance Equipment used: Rolling walker (2 wheels) Transfers: Sit to/from Stand Sit to Stand: Min assist, From elevated surface           General transfer comment: Min A for standing at EOB from an elevated surface. Verbal and tactile cuing for hand placement    Ambulation/Gait Ambulation/Gait assistance: Min assist   Assistive device: Rolling walker (2 wheels) Gait Pattern/deviations: Shuffle, Trunk flexed, Narrow base of support Gait velocity: decreased     General Gait Details: Min A for sequencing and walker management.   Stairs             Wheelchair Mobility     Tilt Bed    Modified Rankin (Stroke Patients Only)       Balance Overall balance assessment: Needs assistance Sitting-balance support: Feet supported Sitting balance-Leahy Scale: Fair Sitting balance - Comments: Min A d/t slight posterior lean. Postural control: Posterior lean Standing balance support: Bilateral upper extremity supported, Reliant on assistive device for balance, During functional activity Standing balance-Leahy Scale: Poor Standing balance comment: Reliance on RW for balance.                            Communication Communication Communication: Impaired Factors Affecting Communication: Hearing impaired;Difficulty expressing self;Reduced clarity of speech  Cognition Arousal: Lethargic Behavior During Therapy: WFL for tasks assessed/performed   PT - Cognitive impairments: History of cognitive impairments, Sequencing, Initiation, Safety/Judgement  PT - Cognition Comments: difficult to assess at times due to expressive language deficits Following commands: Impaired Following commands impaired: Follows one step commands inconsistently    Cueing Cueing Techniques:  Verbal cues, Tactile cues, Visual cues  Exercises Other Exercises Other Exercises: Assistance with toileting. Multiple STS from EOB and toilet.    General Comments        Pertinent Vitals/Pain Pain Assessment Pain Assessment: No/denies pain    Home Living                          Prior Function            PT Goals (current goals can now be found in the care plan section) Acute Rehab PT Goals PT Goal Formulation: With patient Time For Goal Achievement: 12/13/23 Potential to Achieve Goals: Fair Progress towards PT goals: PT to reassess next treatment    Frequency    Min 2X/week      PT Plan      Co-evaluation              AM-PAC PT 6 Clicks Mobility   Outcome Measure  Help needed turning from your back to your side while in a flat bed without using bedrails?: Total Help needed moving from lying on your back to sitting on the side of a flat bed without using bedrails?: None Help needed moving to and from a bed to a chair (including a wheelchair)?: A Little Help needed standing up from a chair using your arms (e.g., wheelchair or bedside chair)?: A Little Help needed to walk in hospital room?: A Little Help needed climbing 3-5 steps with a railing? : A Lot 6 Click Score: 16    End of Session Equipment Utilized During Treatment: Gait belt Activity Tolerance: Patient tolerated treatment well Patient left: in bed;with call bell/phone within reach;with bed alarm set;with nursing/sitter in room;with family/visitor present Nurse Communication: Mobility status PT Visit Diagnosis: Unsteadiness on feet (R26.81);History of falling (Z91.81)     Time: 8473-8442 PT Time Calculation (min) (ACUTE ONLY): 31 min  Charges:                            Victor Gould, SPT    Victor Gould 12/06/2023, 4:56 PM

## 2023-12-06 NOTE — Progress Notes (Signed)
 Patient still lethargic. Held PO meds due at this time. Dr. Von notified.

## 2023-12-07 ENCOUNTER — Encounter: Payer: Self-pay | Admitting: Student in an Organized Health Care Education/Training Program

## 2023-12-07 ENCOUNTER — Inpatient Hospital Stay

## 2023-12-07 DIAGNOSIS — I639 Cerebral infarction, unspecified: Secondary | ICD-10-CM | POA: Diagnosis not present

## 2023-12-07 LAB — GLUCOSE, CAPILLARY
Glucose-Capillary: 191 mg/dL — ABNORMAL HIGH (ref 70–99)
Glucose-Capillary: 144 mg/dL — ABNORMAL HIGH (ref 70–99)
Glucose-Capillary: 148 mg/dL — ABNORMAL HIGH (ref 70–99)
Glucose-Capillary: 162 mg/dL — ABNORMAL HIGH (ref 70–99)

## 2023-12-07 LAB — BASIC METABOLIC PANEL WITH GFR
Anion gap: 15 (ref 5–15)
Anion gap: 14 (ref 5–15)
BUN: 36 mg/dL — ABNORMAL HIGH (ref 8–23)
BUN: 40 mg/dL — ABNORMAL HIGH (ref 8–23)
CO2: 18 mmol/L — ABNORMAL LOW (ref 22–32)
CO2: 17 mmol/L — ABNORMAL LOW (ref 22–32)
Calcium: 7 mg/dL — ABNORMAL LOW (ref 8.9–10.3)
Calcium: 6.5 mg/dL — ABNORMAL LOW (ref 8.9–10.3)
Chloride: 113 mmol/L — ABNORMAL HIGH (ref 98–111)
Chloride: 113 mmol/L — ABNORMAL HIGH (ref 98–111)
Creatinine, Ser: 3.28 mg/dL — ABNORMAL HIGH (ref 0.61–1.24)
Creatinine, Ser: 3.39 mg/dL — ABNORMAL HIGH (ref 0.61–1.24)
GFR, Estimated: 18 mL/min — ABNORMAL LOW (ref >60)
GFR, Estimated: 17 mL/min — ABNORMAL LOW (ref >60)
Glucose, Bld: 166 mg/dL — ABNORMAL HIGH (ref 70–99)
Glucose, Bld: 171 mg/dL — ABNORMAL HIGH (ref 70–99)
Potassium: 3.7 mmol/L (ref 3.5–5.1)
Potassium: 3.5 mmol/L (ref 3.5–5.1)
Sodium: 146 mmol/L — ABNORMAL HIGH (ref 135–145)
Sodium: 144 mmol/L (ref 135–145)

## 2023-12-07 LAB — CBC
HCT: 44.3 % (ref 39.0–52.0)
Hemoglobin: 14.3 g/dL (ref 13.0–17.0)
MCH: 30.2 pg (ref 26.0–34.0)
MCHC: 32.3 g/dL (ref 30.0–36.0)
MCV: 93.7 fL (ref 80.0–100.0)
Platelets: 265 K/uL (ref 150–400)
RBC: 4.73 MIL/uL (ref 4.22–5.81)
RDW: 13.8 % (ref 11.5–15.5)
WBC: 14.3 K/uL — ABNORMAL HIGH (ref 4.0–10.5)
nRBC: 0 % (ref 0.0–0.2)

## 2023-12-07 LAB — RESPIRATORY PANEL BY PCR

## 2023-12-07 LAB — PHOSPHORUS: Phosphorus: 2.6 mg/dL (ref 2.5–4.6)

## 2023-12-07 LAB — MAGNESIUM: Magnesium: 2 mg/dL (ref 1.7–2.4)

## 2023-12-07 MED ORDER — SODIUM BICARBONATE 650 MG PO TABS
1300.0000 mg | ORAL_TABLET | Freq: Three times a day (TID) | ORAL | Status: DC
  Administered 2023-12-07 – 2023-12-09 (×7): 1300 mg via ORAL
  Administered 2023-12-09: 650 mg via ORAL
  Administered 2023-12-09 (×3): 1300 mg via ORAL
  Administered 2023-12-09: 650 mg via ORAL
  Administered 2023-12-10 – 2023-12-14 (×16): 1300 mg via ORAL
  Filled 2023-12-07 (×23): qty 2

## 2023-12-07 MED ORDER — SODIUM BICARBONATE 8.4 % IV SOLN
100.0000 meq | Freq: Once | INTRAVENOUS | Status: AC
  Administered 2023-12-07: 100 meq via INTRAVENOUS
  Filled 2023-12-07: qty 50

## 2023-12-07 MED ORDER — ENSURE PLUS HIGH PROTEIN PO LIQD
237.0000 mL | Freq: Two times a day (BID) | ORAL | Status: DC
  Administered 2023-12-08 – 2023-12-11 (×5): 237 mL via ORAL

## 2023-12-07 MED ORDER — SODIUM CHLORIDE 0.9 % IV SOLN: INTRAVENOUS | Status: DC

## 2023-12-07 NOTE — Progress Notes (Signed)
 Sent secure chat to Dr. Jerelene and made her aware that patient's right arm is swollen compared to his left. Right arm elevated on pillows. MD acknowledged and no new orders given at this time.

## 2023-12-07 NOTE — Progress Notes (Addendum)
 Mobility Specialist - Progress Note   12/07/23 1600  Mobility  Activity Stood at bedside;Ambulated with assistance;Turned to right side;Turned to left side  Level of Assistance Contact guard assist, steadying assist  Assistive Device Front wheel walker  Distance Ambulated (ft) 160 ft  Range of Motion/Exercises Active  Activity Response Tolerated well  Mobility Referral Yes  Mobility visit 1 Mobility  Mobility Specialist Start Time (ACUTE ONLY) 0940  Mobility Specialist Stop Time (ACUTE ONLY) 1026  Mobility Specialist Time Calculation (min) (ACUTE ONLY) 46 min   Pt resting in bed on RA upon entry. Pt is agreeable and complaint to participate. Pt STS and ambulates to hallway around NS CGA/MinA with RW. Pt required manual assistance/ directional cuing with walker throughout session. Pt was more vocal during session today. Pt returned to bed and left with needs in reach. Bed alarm activated.   Guido Rumble Mobility Specialist 12/07/23, 4:59 PM

## 2023-12-07 NOTE — Progress Notes (Signed)
 [facility] Progress Note  Patient: Victor Gould    FMW:[id number]  DOA: 11/28/2023     Date of Service: the patient was seen and examined on 12/07/2023  Chief Complaint  Patient presents with   Altered Mental Status   Brief hospital course: Victor Gould is a 83 y.o. male with a PMH significant for CAD s/p CABG, HTN, afib, CKDIII, Type II DM, hypothyroidism, h/o prostate cancer, HLD, HF, h/o CVA, subdural hematoma (3 years prior), parkinson's disease. At baseline, they live with their wife and are independent with ADLs.   They presented from home to the ED on 11/28/2023 with confusion x1 days. Wife states that he has been using inappropriate words and actions throughout the day. He did not have any injury and he denies having any symptoms. He denies any focalized weakness or speech abnormalities. He endorses good adherence with his medications which includes aspirin , plavix , and eliquis .    ED w/up: VS significantly elevated blood pressure up to 216/84, mild bradycardia in high 50s. Otherwise stable.  Labs unremarkable CBC, glucose 252, Cr 3.43 ( roughly baseline) Brain MRI: 1. Small acute infarct in the left insula and many small acute infarcts in the overlying left parietal lobe (posterior left MCA territory). 2. Many remote infarcts, as detailed above. ( As shown in CT) 3. Chronic right vertebral artery occlusion.   They were initially treated with nothing.    Patient was admitted to medicine service for further workup and management of acute CVA as outlined in detail below.    Assessment and Plan:  # Acute CVA Patient presented with aphasia, AMS, no focal deficits MRI brain Small acute infarct in the left insula and many small acute infarcts in the overlying left parietal lobe (posterior left MCA territory) Neurology consulted, recommended to continue Eliquis  and Plavix  for CAD Carotid duplex negative for any significant stenosis TTE LVEF 50 to 55%, grade 1  diastolic dysfunction, no WMA, negative PFO. SLP eval done, patient is on regular diet PT and OT following, initially recommended home health but due to mental status changes patient will require SNF placement. Follow TOC for SNF placement  # AKI on CKD stage IV Baseline sCr 2.64 which was 2 year ago  Cr 3,28, Baseline Cr appears to be 3.0 - 3.2 Encourage oral hydration, on IV fluids Bladder scan negative for retention Monitor urine output and BMP daily  # Metabolic acidosis Likely 2/2 AKI on CKD S/p Sodium bicarb amp Inc bicarb tabs 1300 TID Check BMP daily  # Hypophosphatemia, Phos repleted.  Resolved Monitor and replete as needed.  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  Most of the time sleepy, did not sleep last night well. Seen by Neurology, recommended no further imaging.  Continue to monitor. Delirium precautions  #Diarrhea, as per his wife that patient has diarrhea since admission, now resolved X-ray abdomen did not show any acute findings Started probiotics, discontinue Imodium  and monitor  RUE swelling - US   r/o DVT - Elevation of RUE  Body mass index is 23.64 kg/m.  Interventions:  Diet: Regular diet DVT Prophylaxis: Eliquis   Advance goals of care discussion: Full code  Family Communication: family was present at bedside, at the time of interview.   discussed with patient's wife at bedside.  Disposition:   Pt is from Home, admitted with Acute CVA, and AKI, on IVF,  which precludes a safe discharge. Discharge to SNF, when bed will be available.     Subjective: No significant events overnight, patient didn't sleep well overnight. Walked around for sometime in the morning and fell asleep  Physical Exam: General: NAD, lying comfortably Appear in no distress, affect appropriate Neck: no JVD,  Cardiovascular: S1 and S2 Present, no Murmur,  Respiratory: good respiratory effort, Bilateral Air entry equal and Decreased, no Crackles, no wheezes Abdomen: Bowel Sound present, Soft and no tenderness,  Skin: no rashes Extremities: no Pedal edema, no calf tenderness Neurologic: without any new focal findings   Vitals:   12/06/23 1931 12/07/23 0105 12/07/23 0355 12/07/23 0754  BP: (!) 140/58 (!) 159/83 (!) 157/76 (!) 157/69  Pulse: 85 91 82 81  Resp: 18 18 17 16   Temp: 97.6 F (36.4 C) 97.6 F (36.4 C) 98.3 F (36.8 C) 97.8 F (36.6 C)  TempSrc:      SpO2: 93% 97% 95% 97%  Weight:        Intake/Output Summary (Last 24 hours) at 12/07/2023 9170 Last data filed at 12/06/2023 1900 Gross per 24 hour  Intake 0 ml  Output 200 ml  Net -200 ml    Filed Weights   11/28/23 1836  Weight: 72.6 kg    Data Reviewed: I have personally reviewed and interpreted daily labs, tele strips, imagings as discussed above. I reviewed all nursing notes, pharmacy notes, vitals, pertinent old records I have discussed plan of care as described above with RN and patient/family.  CBC: Recent Labs  Lab 12/03/23 0455 12/04/23 0419 12/05/23 0409 12/06/23 0443 12/07/23 0338  WBC 8.4 9.0 7.1 12.3* 14.3*  HGB 13.2 12.5* 13.6 14.2 14.3  HCT 39.0 38.4* 40.8 43.3 44.3  MCV 90.5 93.2 92.1 94.1 93.7  PLT 219 212 212 245 265   Basic Metabolic Panel: Recent Labs  Lab 12/03/23 0455 12/04/23 0419 12/05/23 0409 12/06/23 0443 12/07/23 0338  NA 139 140 141 144 146*  K 3.8 3.6 3.5 3.7 3.7  CL 109 110 111 108  113*  CO2 19* 18* 18* 17* 18*  GLUCOSE 161* 143* 145* 184* 166*  BUN 38* 35* 38* 36* 36*  CREATININE 3.25* 3.22* 3.22* 3.42* 3.28*  CALCIUM  7.5* 7.0* 6.9* 7.1* 7.0*  MG 2.1 2.0 2.0 1.9 2.0  PHOS 2.9 2.9 2.6 2.7 2.6    Studies: No results found.   Scheduled Meds:  amLODipine   10 mg Oral Daily   apixaban   2.5 mg Oral BID   atorvastatin   40 mg Oral Daily   bisoprolol   5 mg Oral Daily   carbidopa -levodopa   1 tablet Oral TID   clopidogrel   75 mg Oral Daily   donepezil   5 mg Oral QHS   insulin  aspart  0-9 Units Subcutaneous TID WC   levothyroxine   75 mcg Oral Q0600   losartan   50 mg Oral Daily   QUEtiapine   25 mg Oral QHS   saccharomyces boulardii  250 mg Oral BID   sodium bicarbonate   650 mg Oral TID   sodium chloride  flush  3 mL Intravenous Once  Continuous Infusions:  sodium chloride  50 mL/hr at 12/06/23 1125     PRN Meds: acetaminophen  **OR** acetaminophen , albuterol , bisacodyl , hydrALAZINE  **OR** hydrALAZINE , liver oil-zinc  oxide, loperamide , ondansetron  **OR** ondansetron  (ZOFRAN ) IV  Time spent: 40 minutes  Author: Charley Lafrance. MD [facility] 12/07/2023 8:29 AM  To reach On-call, see care teams to locate the attending and reach out to them via [email or web address]. If 7PM-7AM, please contact night-coverage If you still have difficulty reaching the attending provider, please page the [facility] (Director on Call) for [facility] on amion for assistance.

## 2023-12-07 NOTE — Plan of Care (Signed)
  Problem: Metabolic: Goal: Ability to maintain appropriate glucose levels will improve Outcome: Progressing   Problem: Skin Integrity: Goal: Risk for impaired skin integrity will decrease Outcome: Progressing   Problem: Clinical Measurements: Goal: Cardiovascular complication will be avoided Outcome: Progressing   Problem: Elimination: Goal: Will not experience complications related to bowel motility Outcome: Progressing   Problem: Safety: Goal: Ability to remain free from injury will improve Outcome: Progressing

## 2023-12-08 DIAGNOSIS — I639 Cerebral infarction, unspecified: Secondary | ICD-10-CM | POA: Diagnosis not present

## 2023-12-08 LAB — GLUCOSE, CAPILLARY
Glucose-Capillary: 154 mg/dL — ABNORMAL HIGH (ref 70–99)
Glucose-Capillary: 144 mg/dL — ABNORMAL HIGH (ref 70–99)
Glucose-Capillary: 146 mg/dL — ABNORMAL HIGH (ref 70–99)

## 2023-12-08 LAB — BASIC METABOLIC PANEL WITH GFR
Anion gap: 19 — ABNORMAL HIGH (ref 5–15)
Anion gap: 19 — ABNORMAL HIGH (ref 5–15)
BUN: 41 mg/dL — ABNORMAL HIGH (ref 8–23)
BUN: 44 mg/dL — ABNORMAL HIGH (ref 8–23)
CO2: 19 mmol/L — ABNORMAL LOW (ref 22–32)
CO2: 17 mmol/L — ABNORMAL LOW (ref 22–32)
Calcium: 6.6 mg/dL — ABNORMAL LOW (ref 8.9–10.3)
Calcium: 6.5 mg/dL — ABNORMAL LOW (ref 8.9–10.3)
Chloride: 110 mmol/L (ref 98–111)
Chloride: 111 mmol/L (ref 98–111)
Creatinine, Ser: 3.29 mg/dL — ABNORMAL HIGH (ref 0.61–1.24)
Creatinine, Ser: 3.45 mg/dL — ABNORMAL HIGH (ref 0.61–1.24)
GFR, Estimated: 18 mL/min — ABNORMAL LOW (ref >60)
GFR, Estimated: 17 mL/min — ABNORMAL LOW (ref >60)
Glucose, Bld: 156 mg/dL — ABNORMAL HIGH (ref 70–99)
Glucose, Bld: 170 mg/dL — ABNORMAL HIGH (ref 70–99)
Potassium: 3.4 mmol/L — ABNORMAL LOW (ref 3.5–5.1)
Potassium: 3.6 mmol/L (ref 3.5–5.1)
Sodium: 148 mmol/L — ABNORMAL HIGH (ref 135–145)
Sodium: 147 mmol/L — ABNORMAL HIGH (ref 135–145)

## 2023-12-08 LAB — CBC
HCT: 40.9 % (ref 39.0–52.0)
Hemoglobin: 13.5 g/dL (ref 13.0–17.0)
MCH: 30.6 pg (ref 26.0–34.0)
MCHC: 33 g/dL (ref 30.0–36.0)
MCV: 92.7 fL (ref 80.0–100.0)
Platelets: 264 K/uL (ref 150–400)
RBC: 4.41 MIL/uL (ref 4.22–5.81)
RDW: 14 % (ref 11.5–15.5)
WBC: 11.2 K/uL — ABNORMAL HIGH (ref 4.0–10.5)
nRBC: 0 % (ref 0.0–0.2)

## 2023-12-08 LAB — TSH: TSH: 4.321 u[IU]/mL (ref 0.350–4.500)

## 2023-12-08 LAB — PHOSPHORUS: Phosphorus: 2.2 mg/dL — ABNORMAL LOW (ref 2.5–4.6)

## 2023-12-08 LAB — MAGNESIUM: Magnesium: 2.2 mg/dL (ref 1.7–2.4)

## 2023-12-08 MED ORDER — SODIUM CHLORIDE 0.45 % IV SOLN: INTRAVENOUS | Status: DC

## 2023-12-08 MED ORDER — STROKE: EARLY STAGES OF RECOVERY BOOK: Freq: Once | Status: AC

## 2023-12-08 MED ORDER — K PHOS MONO-SOD PHOS DI & MONO 155-852-130 MG PO TABS
500.0000 mg | ORAL_TABLET | Freq: Two times a day (BID) | ORAL | Status: AC
  Administered 2023-12-08 (×2): 500 mg via ORAL
  Filled 2023-12-08 (×2): qty 2

## 2023-12-08 MED ORDER — POTASSIUM CHLORIDE 20 MEQ PO PACK
40.0000 meq | PACK | Freq: Once | ORAL | Status: AC
  Administered 2023-12-08: 40 meq via ORAL
  Filled 2023-12-08: qty 2

## 2023-12-08 NOTE — Plan of Care (Signed)
  Problem: Coping: Goal: Ability to adjust to condition or change in health will improve Outcome: Progressing   Problem: Fluid Volume: Goal: Ability to maintain a balanced intake and output will improve Outcome: Progressing   Problem: Metabolic: Goal: Ability to maintain appropriate glucose levels will improve Outcome: Progressing   Problem: Health Behavior/Discharge Planning: Goal: Ability to identify and utilize available resources and services will improve Outcome: Progressing Goal: Ability to manage health-related needs will improve Outcome: Progressing   Problem: Skin Integrity: Goal: Risk for impaired skin integrity will decrease Outcome: Progressing   Problem: Tissue Perfusion: Goal: Adequacy of tissue perfusion will improve Outcome: Progressing

## 2023-12-08 NOTE — Plan of Care (Signed)
  Problem: Education: Goal: Ability to describe self-care measures that may prevent or decrease complications (Diabetes Survival Skills Education) will improve Outcome: Progressing Goal: Individualized Educational Video(s) Outcome: Progressing   Problem: Coping: Goal: Ability to adjust to condition or change in health will improve Outcome: Progressing   Problem: Fluid Volume: Goal: Ability to maintain a balanced intake and output will improve Outcome: Progressing   Problem: Health Behavior/Discharge Planning: Goal: Ability to identify and utilize available resources and services will improve Outcome: Progressing Goal: Ability to manage health-related needs will improve Outcome: Progressing   Problem: Metabolic: Goal: Ability to maintain appropriate glucose levels will improve Outcome: Progressing   Problem: Nutritional: Goal: Maintenance of adequate nutrition will improve Outcome: Progressing Goal: Progress toward achieving an optimal weight will improve Outcome: Progressing   Problem: Tissue Perfusion: Goal: Adequacy of tissue perfusion will improve Outcome: Progressing   Problem: Education: Goal: Knowledge of General Education information will improve Description: Including pain rating scale, medication(s)/side effects and non-pharmacologic comfort measures Outcome: Progressing   Problem: Clinical Measurements: Goal: Ability to maintain clinical measurements within normal limits will improve Outcome: Progressing Goal: Will remain free from infection Outcome: Progressing Goal: Diagnostic test results will improve Outcome: Progressing Goal: Respiratory complications will improve Outcome: Progressing Goal: Cardiovascular complication will be avoided Outcome: Progressing   Problem: Coping: Goal: Level of anxiety will decrease Outcome: Progressing   Problem: Elimination: Goal: Will not experience complications related to bowel motility Outcome: Progressing Goal:  Will not experience complications related to urinary retention Outcome: Progressing

## 2023-12-08 NOTE — Progress Notes (Signed)
 [facility] Progress Note  Patient: Victor Gould    FMW:[id number]  DOA: 11/28/2023     Date of Service: the patient was seen and examined on 12/08/2023  Chief Complaint  Patient presents with   Altered Mental Status   Brief hospital course: Victor Gould is a 83 y.o. male with a PMH significant for CAD s/p CABG, HTN, afib, CKDIII, Type II DM, hypothyroidism, h/o prostate cancer, HLD, HF, h/o CVA, subdural hematoma (3 years prior), parkinson's disease. At baseline, they live with their wife and are independent with ADLs.   They presented from home to the ED on 11/28/2023 with confusion x1 days. Wife states that he has been using inappropriate words and actions throughout the day. He did not have any injury and he denies having any symptoms. He denies any focalized weakness or speech abnormalities. He endorses good adherence with his medications which includes aspirin , plavix , and eliquis .    ED w/up: VS significantly elevated blood pressure up to 216/84, mild bradycardia in high 50s. Otherwise stable.  Labs unremarkable CBC, glucose 252, Cr 3.43 ( roughly baseline) Brain MRI: 1. Small acute infarct in the left insula and many small acute infarcts in the overlying left parietal lobe (posterior left MCA territory). 2. Many remote infarcts, as detailed above. ( As shown in CT) 3. Chronic right vertebral artery occlusion.   They were initially treated with nothing.    Patient was admitted to medicine service for further workup and management of acute CVA as outlined in detail below.    Assessment and Plan:  # Acute CVA Patient presented with aphasia, AMS, no focal deficits MRI brain Small acute infarct in the left insula and many small acute infarcts in the overlying left parietal lobe (posterior left MCA territory) Neurology consulted, recommended to continue Eliquis  and Plavix  for CAD Carotid duplex negative for any significant stenosis TTE LVEF 50 to 55%, grade 1  diastolic dysfunction, no WMA, negative PFO. SLP eval done, patient is on regular diet PT and OT following, initially recommended home health but due to mental status changes patient will require SNF placement. 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  Most of the time sleepy, did not sleep last night well. Seen by Neurology on 08/08, recommended no further imaging.  Continue to monitor. - Delirium precautions  # AKI on CKD stage IV Baseline sCr 2.64 which was 2 year ago  Cr 3.29, Baseline Cr appears to be 3.0 - 3.2 Encourage oral hydration, on IV fluids as patient has very poor po intake Bladder scan negative for retention Monitor urine output and BMP daily  # Metabolic acidosis AGMA - suspect due to starvation ketoacidosis Likely 2/2 AKI on CKD S/p Sodium bicarb amp Inc bicarb tabs 1300 TID Check BMP  Hypernatremia - Na inc to 148, due to poor po intake - encouraged po intake, on 0.45% NS at 50cc/hr - Monitor BMP pm today  Hypokalemia - Monitor and replete as needed - Mag normal  # Hypophosphatemia, Phos repleted.  Resolved Monitor and replete as needed.  # HTN- severely elevated S/p permissive hypertension for 48 hours On Amlodipine  10mg , Bisoprolol  5mg , Losartan  50mg  - Hydralazine  IV vs po prn Monitor BP and titrate medications accordingly  # Type II DM - sSSI -  A1c 7.4 well-controlled   # Parkinsons - continue home sinemet    # Hypothyroidism - continue home synthroid   #Diarrhea, as per his wife that patient has diarrhea since admission, now resolved X-ray abdomen did not show any acute findings Started probiotics, discontinue Imodium  and monitor  RUE swelling - US  RUE neg for DVT - Elevation of  RUE  Body mass index is 23.64 kg/m.  Interventions:  Diet: Regular diet DVT Prophylaxis: Eliquis   Advance goals of care discussion: Full code  Family Communication: family was present at bedside, at the time of interview.   discussed with patient's wife at bedside.  Disposition:  Pt is from Home, admitted with Acute CVA, and AKI, on IVF,  which precludes a safe discharge. Discharge to SNF, when bed will be available.     Subjective: No significant events overnight, Walked around for sometime in the morning and fell asleep. Poor po intake, dehydrated, encouraged po intake  Physical Exam: General: NAD, lying comfortably Appear in no distress, affect appropriate Neck: no JVD,  Cardiovascular: S1 and S2 Present, no Murmur,  Respiratory: good respiratory effort, Bilateral Air entry equal and Decreased, no Crackles, no wheezes Abdomen: Bowel Sound present, Soft and no tenderness,  Skin: no rashes Extremities: no Pedal edema, no calf tenderness Neurologic: without any new focal findings, minimally awake with stimulus   Vitals:   12/08/23 0021 12/08/23 0425 12/08/23 0826 12/08/23 1256  BP: (!) 163/63 (!) 152/63 (!) 165/70 (!) 149/68  Pulse: 78 79 70 73  Resp: 18 18 17    Temp: (!) 97.5 F (36.4 C) (!) 97.3 F (36.3 C) 97.6 F (36.4 C) 97.9 F (36.6 C)  TempSrc: Oral Oral Oral Oral  SpO2: 96% 96% 96% 96%  Weight:      Height:        Intake/Output Summary (Last 24 hours) at 12/08/2023 1422 Last data filed at 12/08/2023 1100 Gross per 24 hour  Intake 198.43 ml  Output 700 ml  Net -501.57 ml    Filed Weights   11/28/23 1836  Weight: 72.6 kg    Data Reviewed: I have personally reviewed and interpreted daily labs, tele strips, imagings as discussed above. I reviewed all nursing notes, pharmacy notes, vitals, pertinent old records I have discussed plan of care as described above with RN and patient/family.  CBC: Recent Labs  Lab 12/04/23 0419 12/05/23 0409  12/06/23 0443 12/07/23 0338 12/08/23 0523  WBC 9.0 7.1 12.3* 14.3* 11.2*  HGB 12.5* 13.6 14.2 14.3 13.5  HCT 38.4* 40.8 43.3 44.3 40.9  MCV 93.2 92.1 94.1 93.7 92.7  PLT 212 212 245 265 264   Basic Metabolic Panel: Recent Labs  Lab 12/04/23 0419 12/05/23 0409 12/06/23 0443 12/07/23 0338 12/07/23 1645 12/08/23 0523  NA 140 141 144 146* 144 148*  K 3.6 3.5 3.7 3.7 3.5 3.4*  CL 110 111 108 113* 113* 110  CO2 18* 18* 17* 18* 17* 19*  GLUCOSE 143* 145* 184* 166* 171* 156*  BUN 35* 38* 36* 36* 40* 41*  CREATININE 3.22* 3.22* 3.42* 3.28* 3.39* 3.29*  CALCIUM  7.0* 6.9* 7.1* 7.0* 6.5* 6.6*  MG 2.0 2.0 1.9 2.0  --  2.2  PHOS 2.9 2.6 2.7 2.6  --  2.2*    Studies: US  Venous Img Upper Uni Right(DVT) Result Date: 12/08/2023 CLINICAL DATA:  Swelling. EXAM: RIGHT UPPER EXTREMITY VENOUS DOPPLER ULTRASOUND TECHNIQUE: Gray-scale sonography with graded compression, as well as color Doppler and duplex ultrasound were performed to evaluate the upper extremity deep  venous system from the level of the subclavian vein and including the jugular, axillary, basilic, radial, ulnar and upper cephalic vein. Spectral Doppler was utilized to evaluate flow at rest and with distal augmentation maneuvers. COMPARISON:  None Available. FINDINGS: Contralateral Subclavian Vein: No evidence of thrombus. Normal color Doppler flow and phasicity. Internal Jugular Vein: No evidence of thrombus. Normal compressibility, color Doppler flow and phasicity. Subclavian Vein: No evidence of thrombus. Normal color Doppler flow and phasicity. Axillary Vein: No evidence of thrombus. Normal compressibility, respiratory phasicity and response to augmentation. Cephalic Vein: No evidence of thrombus. Normal compressibility, respiratory phasicity and response to augmentation. Basilic Vein: No evidence of thrombus. Normal compressibility, respiratory phasicity and response to augmentation. Brachial Veins: No evidence of thrombus. Normal  compressibility, respiratory phasicity and response to augmentation. Radial Veins: No evidence of thrombus. Normal compressibility and color Doppler flow. Ulnar Veins: No evidence of thrombus. Normal compressibility and color Doppler flow. Other Findings:  None visualized. IMPRESSION: No evidence of DVT within the right upper extremity. Electronically Signed   By: Juliene Balder M.D.   On: 12/08/2023 10:26     Scheduled Meds:   stroke: early stages of recovery book   Does not apply Once   amLODipine   10 mg Oral Daily   apixaban   2.5 mg Oral BID   atorvastatin   40 mg Oral Daily   bisoprolol   5 mg Oral Daily   carbidopa -levodopa   1 tablet Oral TID   clopidogrel   75 mg Oral Daily   donepezil   5 mg Oral QHS   feeding supplement  237 mL Oral BID BM   insulin  aspart  0-9 Units Subcutaneous TID WC   levothyroxine   75 mcg Oral Q0600   losartan   50 mg Oral Daily   phosphorus  500 mg Oral BID   QUEtiapine   25 mg Oral QHS   saccharomyces boulardii  250 mg Oral BID   sodium bicarbonate   1,300 mg Oral TID   sodium chloride  flush  3 mL Intravenous Once   Continuous Infusions:  sodium chloride  50 mL/hr at 12/08/23 1001     PRN Meds: acetaminophen  **OR** acetaminophen , albuterol , bisacodyl , hydrALAZINE  **OR** hydrALAZINE , liver oil-zinc  oxide, ondansetron  **OR** ondansetron  (ZOFRAN ) IV  Time spent: 40 minutes  Author: Carnell Beavers. MD [facility] 12/08/2023 2:22 PM  To reach On-call, see care teams to locate the attending and reach out to them via [email or web address]. If 7PM-7AM, please contact night-coverage If you still have difficulty reaching the attending provider, please page the [facility] (Director on Call) for [facility] on amion for assistance.

## 2023-12-09 DIAGNOSIS — I639 Cerebral infarction, unspecified: Secondary | ICD-10-CM | POA: Diagnosis not present

## 2023-12-09 LAB — PHOSPHORUS: Phosphorus: 2.4 mg/dL — ABNORMAL LOW (ref 2.5–4.6)

## 2023-12-09 LAB — BASIC METABOLIC PANEL WITH GFR
Anion gap: 14 (ref 5–15)
Anion gap: 17 — ABNORMAL HIGH (ref 5–15)
BUN: 44 mg/dL — ABNORMAL HIGH (ref 8–23)
BUN: 44 mg/dL — ABNORMAL HIGH (ref 8–23)
CO2: 18 mmol/L — ABNORMAL LOW (ref 22–32)
CO2: 18 mmol/L — ABNORMAL LOW (ref 22–32)
Calcium: 6.5 mg/dL — ABNORMAL LOW (ref 8.9–10.3)
Calcium: 6.8 mg/dL — ABNORMAL LOW (ref 8.9–10.3)
Chloride: 116 mmol/L — ABNORMAL HIGH (ref 98–111)
Chloride: 115 mmol/L — ABNORMAL HIGH (ref 98–111)
Creatinine, Ser: 3.39 mg/dL — ABNORMAL HIGH (ref 0.61–1.24)
Creatinine, Ser: 3.31 mg/dL — ABNORMAL HIGH (ref 0.61–1.24)
GFR, Estimated: 17 mL/min — ABNORMAL LOW (ref >60)
GFR, Estimated: 18 mL/min — ABNORMAL LOW (ref >60)
Glucose, Bld: 168 mg/dL — ABNORMAL HIGH (ref 70–99)
Glucose, Bld: 163 mg/dL — ABNORMAL HIGH (ref 70–99)
Potassium: 3.4 mmol/L — ABNORMAL LOW (ref 3.5–5.1)
Potassium: 3.8 mmol/L (ref 3.5–5.1)
Sodium: 148 mmol/L — ABNORMAL HIGH (ref 135–145)
Sodium: 150 mmol/L — ABNORMAL HIGH (ref 135–145)

## 2023-12-09 LAB — CBC
HCT: 40.6 % (ref 39.0–52.0)
Hemoglobin: 13.3 g/dL (ref 13.0–17.0)
MCH: 30.6 pg (ref 26.0–34.0)
MCHC: 32.8 g/dL (ref 30.0–36.0)
MCV: 93.3 fL (ref 80.0–100.0)
Platelets: 295 K/uL (ref 150–400)
RBC: 4.35 MIL/uL (ref 4.22–5.81)
RDW: 13.9 % (ref 11.5–15.5)
WBC: 10.1 K/uL (ref 4.0–10.5)
nRBC: 0 % (ref 0.0–0.2)

## 2023-12-09 LAB — GLUCOSE, CAPILLARY
Glucose-Capillary: 146 mg/dL — ABNORMAL HIGH (ref 70–99)
Glucose-Capillary: 156 mg/dL — ABNORMAL HIGH (ref 70–99)
Glucose-Capillary: 150 mg/dL — ABNORMAL HIGH (ref 70–99)
Glucose-Capillary: 147 mg/dL — ABNORMAL HIGH (ref 70–99)
Glucose-Capillary: 145 mg/dL — ABNORMAL HIGH (ref 70–99)

## 2023-12-09 LAB — MAGNESIUM: Magnesium: 2 mg/dL (ref 1.7–2.4)

## 2023-12-09 LAB — VITAMIN B12: Vitamin B-12: 2459 pg/mL — ABNORMAL HIGH (ref 180–914)

## 2023-12-09 MED ORDER — POTASSIUM CHLORIDE 20 MEQ PO PACK
40.0000 meq | PACK | Freq: Once | ORAL | Status: AC
  Administered 2023-12-09 (×2): 40 meq via ORAL
  Filled 2023-12-09: qty 2

## 2023-12-09 MED ORDER — K PHOS MONO-SOD PHOS DI & MONO 155-852-130 MG PO TABS
500.0000 mg | ORAL_TABLET | ORAL | Status: AC
  Administered 2023-12-09: 500 mg via ORAL
  Administered 2023-12-09 (×2): 125 mg via ORAL
  Administered 2023-12-09: 500 mg via ORAL
  Filled 2023-12-09 (×2): qty 2

## 2023-12-09 MED ORDER — LOSARTAN POTASSIUM 50 MG PO TABS
75.0000 mg | ORAL_TABLET | Freq: Every day | ORAL | Status: DC
  Administered 2023-12-09 – 2023-12-14 (×7): 75 mg via ORAL
  Filled 2023-12-09 (×6): qty 1

## 2023-12-09 MED ORDER — QUETIAPINE FUMARATE 25 MG PO TABS: 12.5000 mg | ORAL_TABLET | Freq: Every evening | ORAL | Status: DC | PRN

## 2023-12-09 MED ORDER — DEXTROSE 5 % IV SOLN: INTRAVENOUS | Status: DC

## 2023-12-09 NOTE — Progress Notes (Addendum)
 [facility] Progress Note  Patient: Victor Gould    FMW:[id number]  DOA: 11/28/2023     Date of Service: the patient was seen and examined on 12/09/2023  Chief Complaint  Patient presents with   Altered Mental Status   Brief hospital course: Victor Gould is a 83 y.o. male with a PMH significant for CAD s/p CABG, HTN, afib, CKDIII, Type II DM, hypothyroidism, h/o prostate cancer, HLD, HF, h/o CVA, subdural hematoma (3 years prior), parkinson's disease. At baseline, they live with their wife and are independent with ADLs.   They presented from home to the ED on 11/28/2023 with confusion x1 days. Wife states that he has been using inappropriate words and actions throughout the day. He did not have any injury and he denies having any symptoms. He denies any focalized weakness or speech abnormalities. He endorses good adherence with his medications which includes aspirin , plavix , and eliquis .    ED w/up: VS significantly elevated blood pressure up to 216/84, mild bradycardia in high 50s. Otherwise stable.  Labs unremarkable CBC, glucose 252, Cr 3.43 ( roughly baseline) Brain MRI: 1. Small acute infarct in the left insula and many small acute infarcts in the overlying left parietal lobe (posterior left MCA territory). 2. Many remote infarcts, as detailed above. ( As shown in CT) 3. Chronic right vertebral artery occlusion.   They were initially treated with nothing.    Patient was admitted to medicine service for further workup and management of acute CVA as outlined in detail below.    Assessment and Plan:  Acute CVA - Patient presented with aphasia, AMS, no focal deficits - MRI brain Small acute infarct in the left insula and many small acute infarcts in the overlying left parietal lobe (posterior left MCA territory) - Neurology consulted, recommended to continue Eliquis  and Plavix  for CAD - Carotid duplex negative for any significant stenosis - TTE LVEF 50 to 55%, grade 1  diastolic dysfunction, no WMA, negative PFO. - SLP eval done, patient is on regular diet - PT and OT following, initially recommended home health but due to mental status changes patient will require SNF placement. - Follow TOC for SNF placement  Sundowning and delirium with agitation Dementia with Parkinson's disease - patient was given a dose of Haldol  on 08/04, was very sleepy - Avoid Haldol  use due to Parkinson's disease - Seen by Neurology on 08/08, no further imaging recommended - need to reset his circadian cycle gradually - Will hold Seroquel  to see if patient will be more awake during the day as well - Seroquel  12.5 mg prn at bedtime - Continue Sinemet  and Aricept  - Delirium precautions  AKI on CKD stage IV - Baseline sCr 2.64 which was 2 year ago  - Cr 3.29, Baseline Cr appears to be 3.0 - 3.2 - Encourage oral hydration, on IV fluids as patient has very poor po intake - Bladder scan negative for retention - Monitor urine output and BMP daily  Metabolic acidosis AGMA - suspect due to starvation ketoacidosis - Likely 2/2 AKI on CKD - S/p Sodium bicarb amp - Inc bicarb tabs 1300 TID - Check BMP  Addendum - worsening - change to D5W 60 cc/hr Free water  deficit 1.5L Repeat Na in 6 hrs   Hypernatremia - Na inc to 148, due to poor po intake - encouraged po intake, Inc 0.45% NS at 75 cc/hr - Monitor BMP pm today  Hypokalemia - Monitor and replete as needed - Mag normal  Hypophosphatemia -  Monitor and replete as needed.  HTN- severely elevated - S/p permissive hypertension for 48 hours - On Amlodipine  10mg , Bisoprolol  5mg , Inc Losartan  75 mg - Hydralazine  IV vs po prn - Monitor BP and titrate medications accordingly  Type II DM - sSSI - A1c 7.4 well-controlled   Parkinsons - continue home sinemet    Hypothyroidism - continue home synthroid   Diarrhea, as per his wife that patient has diarrhea since admission, now resolved X-ray abdomen did not show any  acute findings Started probiotics, discontinue Imodium  and monitor  RUE swelling - US  RUE neg for DVT - Elevation of RUE  Body mass index is 23.64 kg/m.  Interventions:  Diet: Regular diet DVT Prophylaxis: Eliquis   Advance goals of care discussion: Full code  Family Communication: family was present at bedside, at the time of interview.   discussed with patient's wife at bedside.  Disposition:  Pt is from Home, admitted with Acute CVA, and AKI, on IVF,  which precludes a safe discharge. Discharge to SNF, when bed will be available.     Subjective: No significant events overnight, as per staff patient slept well overnight. Was briefly awake in the morning and took medication, sleeping again More awake at noon and evening as per wife, poor po intake  Physical Exam: General: NAD, lying comfortably Appear in no distress, affect appropriate Neck: no JVD,  Cardiovascular: S1 and S2 Present, no Murmur,  Respiratory: good respiratory effort, Bilateral Air entry equal and Decreased, no Crackles, no wheezes Abdomen: Bowel Sound present, Soft and no tenderness,  Skin: no rashes Extremities: no Pedal edema, no calf tenderness Neurologic: without any new focal findings, minimally awake with stimulus   Vitals:   12/09/23 0025 12/09/23 0448 12/09/23 0802 12/09/23 1223  BP: (!) 150/64 (!) 154/71 (!) 162/72 (!) 159/71  Pulse: 74 78 68 70  Resp:   20 18  Temp: 98.6 F (37 C) 98.4 F (36.9 C) 98.2 F (36.8 C) 97.7 F (36.5 C)  TempSrc: Oral Oral Oral Oral  SpO2: 95% 95% 95% 97%  Weight:      Height:        Intake/Output Summary (Last 24 hours) at 12/09/2023 1420 Last data filed at 12/09/2023 0900 Gross per 24 hour  Intake 0 ml  Output --  Net 0 ml    Filed Weights   11/28/23 1836  Weight: 72.6 kg    Data Reviewed: I have personally reviewed and interpreted daily labs, tele strips, imagings as discussed above. I reviewed all nursing notes, pharmacy notes, vitals,  pertinent old records I have discussed plan of care as described above with RN and patient/family.  CBC: Recent Labs  Lab 12/05/23 0409 12/06/23 0443 12/07/23 0338 12/08/23 0523 12/09/23 0438  WBC 7.1 12.3* 14.3* 11.2* 10.1  HGB 13.6 14.2 14.3 13.5 13.3  HCT 40.8 43.3 44.3 40.9 40.6  MCV 92.1 94.1 93.7 92.7 93.3  PLT 212 245 265 264 295   Basic Metabolic Panel: Recent Labs  Lab 12/05/23 0409 12/06/23 0443 12/07/23 0338 12/07/23 1645 12/08/23 0523 12/08/23 1652 12/09/23 0438  NA 141 144 146* 144 148* 147* 148*  K 3.5 3.7 3.7 3.5 3.4* 3.6 3.4*  CL 111 108 113* 113* 110 111 116*  CO2 18* 17* 18* 17* 19* 17* 18*  GLUCOSE 145* 184* 166* 171* 156* 170* 168*  BUN 38* 36* 36* 40* 41* 44* 44*  CREATININE 3.22* 3.42* 3.28* 3.39* 3.29* 3.45* 3.39*  CALCIUM  6.9* 7.1* 7.0* 6.5* 6.6* 6.5* 6.5*  MG  2.0 1.9 2.0  --  2.2  --  2.0  PHOS 2.6 2.7 2.6  --  2.2*  --  2.4*    Studies: No results found.    Scheduled Meds:  amLODipine   10 mg Oral Daily   apixaban   2.5 mg Oral BID   atorvastatin   40 mg Oral Daily   bisoprolol   5 mg Oral Daily   carbidopa -levodopa   1 tablet Oral TID   clopidogrel   75 mg Oral Daily   donepezil   5 mg Oral QHS   feeding supplement  237 mL Oral BID BM   insulin  aspart  0-9 Units Subcutaneous TID WC   levothyroxine   75 mcg Oral Q0600   losartan   75 mg Oral Daily   QUEtiapine   25 mg Oral QHS   saccharomyces boulardii  250 mg Oral BID   sodium bicarbonate   1,300 mg Oral TID   sodium chloride  flush  3 mL Intravenous Once   Continuous Infusions:  sodium chloride  75 mL/hr at 12/09/23 0845     PRN Meds: acetaminophen  **OR** acetaminophen , albuterol , bisacodyl , hydrALAZINE  **OR** hydrALAZINE , liver oil-zinc  oxide, ondansetron  **OR** ondansetron  (ZOFRAN ) IV  Time spent: 40 minutes  Author: Mileigh Tilley. MD [facility] 12/09/2023 2:20 PM  To reach On-call, see care teams to locate the attending and reach out to them via [email or web address]. If  7PM-7AM, please contact night-coverage If you still have difficulty reaching the attending provider, please page the [facility] (Director on Call) for [facility] on amion for assistance.

## 2023-12-09 NOTE — Progress Notes (Addendum)
 Occupational Therapy Treatment Patient Details Name: Victor Gould MRN: [id number] DOB: May 13, 1940 Today's Date: 12/09/2023   History of present illness Pt is an 83 y.o. male presented from home to the ED on 11/28/2023 with confusion and using inappropriate words and actions throughout the day. 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 PMH significant for CAD s/p CABG, HTN, afib, CKDIII, Type II DM, hypothyroidism, h/o prostate cancer, HLD, HF, h/o CVA, subdural hematoma (3 years prior), parkinson's disease.   OT comments  Pt is supine in bed on arrival. More lethargic this date and agreeable to PT/OT co-tx session. He grimaces in pain, but is not able to state where he is hurting. Pt stating I don't know how to do this or what do I do next? Multiple times throughout session. He did not initiate any movement for bed mobility, needed Mod A to perform and for STS from EOB to RW. Min A x2 for lateral steps at edge of bed for standing grooming tasks, although noted to use LUE with cues to use RUE. He ambulated ~75 ft using RW with Min A for directional cueing and RW management with 2nd assist for lines/leads management and safety. Attempted oral care seated in recliner and needed Max A for cueing, initiation, sequencing. Pt noted to be gagging when eating grilled cheese sandwich, spits it back out. He was able to eat full cup of pears with Max A for self feeding.Notified NT of what pt ate as wife states he has not really eaten in several days. Pt left in recliner with all needs in place and will cont to require skilled acute OT services to maximize his safety and IND to return to PLOF.       If plan is discharge home, recommend the following:  Direct supervision/assist for medications management;Supervision due to cognitive status;Direct supervision/assist for financial management;A lot of help with bathing/dressing/bathroom;A little help with  walking and/or transfers   Equipment Recommendations  Other (comment);BSC/3in1;Tub/shower seat (defer)    Recommendations for Other Services      Precautions / Restrictions Precautions Precautions: Fall Recall of Precautions/Restrictions: Impaired Restrictions Weight Bearing Restrictions Per Provider Order: No       Mobility Bed Mobility Overal bed mobility: Needs Assistance Bed Mobility: Supine to Sit Rolling: Max assist, Mod assist   Supine to sit: Mod assist, HOB elevated Sit to supine: Mod assist   General bed mobility comments: pt lethargic, did not initiate any bed mobility and states I don't know what to do    Transfers Overall transfer level: Needs assistance Equipment used: Rolling walker (2 wheels) Transfers: Sit to/from Stand Sit to Stand: Min assist, Mod assist, +2 physical assistance           General transfer comment: Min/Mod A x2 to stand from EOB to RW, increased cueing for lateral steps to perform standing grooming, then ambulated ~50-60 ft using RW with Min A     Balance Overall balance assessment: Needs assistance Sitting-balance support: Feet supported Sitting balance-Leahy Scale: Fair     Standing balance support: Bilateral upper extremity supported, Reliant on assistive device for balance, During functional activity Standing balance-Leahy Scale: Poor Standing balance comment: RW and Min A from external support                           ADL either performed or assessed with clinical judgement   ADL Overall  ADL's : Needs assistance/impaired Eating/Feeding: Moderate assistance;Sitting;Cueing for sequencing   Grooming: Oral care;Sitting;Moderate assistance;Cueing for sequencing;Brushing hair Grooming Details (indicate cue type and reason): max cueing and assist for intiation; pt continued to ask what do I do?; able to stand at sink mirror to comb his hair with L hand then cued to use R hand                                     Extremity/Trunk Assessment              Vision       Perception     Praxis     Communication Communication Communication: Impaired Factors Affecting Communication: Hearing impaired;Difficulty expressing self;Reduced clarity of speech   Cognition Arousal: Lethargic Behavior During Therapy: WFL for tasks assessed/performed                                 Following commands: Impaired Following commands impaired: Follows one step commands inconsistently      Cueing   Cueing Techniques: Verbal cues, Tactile cues, Visual cues  Exercises      Shoulder Instructions       General Comments per wife, pt has not eaten in 4 days or more; able to get him to eat a cup of pears this date with NT made aware    Pertinent Vitals/ Pain       Pain Assessment Pain Assessment: Faces Faces Pain Scale: Hurts a little bit Pain Location: states he has pain, but then says I don't know when asked where it is Pain Intervention(s): Monitored during session, Repositioned, Limited activity within patient's tolerance  Home Living                                          Prior Functioning/Environment              Frequency  Min 2X/week        Progress Toward Goals  OT Goals(current goals can now be found in the care plan section)  Progress towards OT goals: Progressing toward goals  Acute Rehab OT Goals Patient Stated Goal: ipmrove function OT Goal Formulation: With family Time For Goal Achievement: 12/13/23 Potential to Achieve Goals: Good  Plan      Co-evaluation    PT/OT/SLP Co-Evaluation/Treatment: Yes Reason for Co-Treatment: Complexity of the patient's impairments (multi-system involvement) PT goals addressed during session: Mobility/safety with mobility OT goals addressed during session: ADL's and self-care      AM-PAC OT 6 Clicks Daily Activity     Outcome Measure   Help from another person eating meals?: A  Lot Help from another person taking care of personal grooming?: A Lot Help from another person toileting, which includes using toliet, bedpan, or urinal?: A Lot Help from another person bathing (including washing, rinsing, drying)?: A Lot Help from another person to put on and taking off regular upper body clothing?: A Lot Help from another person to put on and taking off regular lower body clothing?: A Lot 6 Click Score: 12    End of Session Equipment Utilized During Treatment: Rolling walker (2 wheels);Gait belt  OT Visit Diagnosis: Other abnormalities of gait and mobility (R26.89)   Activity Tolerance Patient tolerated treatment well  Patient Left with call bell/phone within reach;with family/visitor present;in chair;with chair alarm set   Nurse Communication Mobility status        Time: 8660-8587 OT Time Calculation (min): 33 min  Charges: OT General Charges $OT Visit: 1 Visit OT Treatments $Self Care/Home Management : 8-22 mins  Victor Gould, OTR/L  12/09/23, 4:23 PM   Kaire Stary E Keela Rubert 12/09/2023, 4:23 PM

## 2023-12-09 NOTE — Progress Notes (Signed)
 Physical Therapy Treatment Patient Details Name: Victor Gould MRN: [id number] DOB: 02/16/1941 Today's Date: 12/09/2023   History of Present Illness Pt is an 83 y.o. male presented from home to the ED on 11/28/2023 with confusion and using inappropriate words and actions throughout the day. 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 PMH significant for CAD s/p CABG, HTN, afib, CKDIII, Type II DM, hypothyroidism, h/o prostate cancer, HLD, HF, h/o CVA, subdural hematoma (3 years prior), parkinson's disease.    PT Comments  Pt seen split session.  AM 8:34-8:46  Lethargic but assisted to EOB with mod/max a x 1 with min a x 1 and occ post LOB while sitting.  He stands to RW with mod a x 1 and general tremors self initiating return to sitting.  +2 is used to transfer to recliner in hopes of keeping pt more alert for breakfast as PO intake has been poor.  Unsafe to progress gait at this time.  1:39-1:49 returned with OT for mobility skills.  He initially refused mobility but with encouragement he does sit EOB with mod a x 2 and stands with min a x 2 for OT tasks at sink.  He opts to use L hand to comb hair despite being R handed.  He is able to progress gait 87' with RW min a x  2 down to min a x 1 with time.  Gait generally unsteady with assist to navigate walker.  Vision is impaired at baseline.  He does opt to remain up in chair but continues to refuse lunch despite encouragement.   If plan is discharge home, recommend the following: A little help with walking and/or transfers;A little help with bathing/dressing/bathroom;Assistance with cooking/housework;Assist for transportation;Help with stairs or ramp for entrance;Supervision due to cognitive status   Can travel by private vehicle        Equipment Recommendations       Recommendations for Other Services       Precautions / Restrictions Precautions Precautions: Fall Recall of  Precautions/Restrictions: Impaired Restrictions Weight Bearing Restrictions Per Provider Order: No     Mobility  Bed Mobility Overal bed mobility: Needs Assistance Bed Mobility: Supine to Sit Rolling: Max assist, Mod assist   Supine to sit: Mod assist, HOB elevated     General bed mobility comments: increased assist today with no initiation on movement to progress to sitting. Patient Response: Cooperative, Flat affect  Transfers Overall transfer level: Needs assistance Equipment used: Rolling walker (2 wheels) Transfers: Sit to/from Stand Sit to Stand: Min assist, Mod assist, +2 physical assistance                Ambulation/Gait Ambulation/Gait assistance: Min assist, +2 safety/equipment Gait Distance (Feet): 80 Feet Assistive device: Rolling walker (2 wheels) Gait Pattern/deviations: Shuffle, Trunk flexed, Narrow base of support Gait velocity: decreased     General Gait Details: Min A for sequencing and walker management,  initially +2 then progresses to +1   Stairs             Wheelchair Mobility     Tilt Bed Tilt Bed Patient Response: Cooperative, Flat affect  Modified Rankin (Stroke Patients Only)       Balance Overall balance assessment: Needs assistance Sitting-balance support: Feet supported Sitting balance-Leahy Scale: Fair Sitting balance - Comments: increased assis this am then inproved this pm   Standing balance support: Bilateral upper extremity supported, Reliant on assistive device for balance,  During functional activity Standing balance-Leahy Scale: Poor Standing balance comment: Reliance on RW for balance.                            Communication Communication Communication: Impaired Factors Affecting Communication: Hearing impaired;Difficulty expressing self;Reduced clarity of speech  Cognition Arousal: Lethargic Behavior During Therapy: WFL for tasks assessed/performed   PT - Cognitive impairments: History of  cognitive impairments, Sequencing, Initiation, Safety/Judgement                       PT - Cognition Comments: sleepy this am, does seem to be more engaged this pm. Following commands: Impaired Following commands impaired: Follows one step commands inconsistently    Cueing Cueing Techniques: Verbal cues, Tactile cues, Visual cues  Exercises      General Comments        Pertinent Vitals/Pain Pain Assessment Pain Assessment: No/denies pain    Home Living                          Prior Function            PT Goals (current goals can now be found in the care plan section) Progress towards PT goals: Not progressing toward goals - comment    Frequency    Min 2X/week      PT Plan      Co-evaluation PT/OT/SLP Co-Evaluation/Treatment: Yes Reason for Co-Treatment: Complexity of the patient's impairments (multi-system involvement) PT goals addressed during session: Mobility/safety with mobility OT goals addressed during session: ADL's and self-care      AM-PAC PT 6 Clicks Mobility   Outcome Measure  Help needed turning from your back to your side while in a flat bed without using bedrails?: Total Help needed moving from lying on your back to sitting on the side of a flat bed without using bedrails?: A Lot Help needed moving to and from a bed to a chair (including a wheelchair)?: A Lot Help needed standing up from a chair using your arms (e.g., wheelchair or bedside chair)?: A Lot Help needed to walk in hospital room?: A Lot Help needed climbing 3-5 steps with a railing? : A Lot 6 Click Score: 11    End of Session Equipment Utilized During Treatment: Gait belt Activity Tolerance: Patient limited by lethargy Patient left: in chair;with chair alarm set;with call bell/phone within reach;with family/visitor present Nurse Communication: Mobility status PT Visit Diagnosis: Unsteadiness on feet (R26.81);History of falling (Z91.81)     Time:  8672-8650 PT Time Calculation (min) (ACUTE ONLY): 22 min  Charges:    $Gait Training: 8-22 mins PT General Charges $$ ACUTE PT VISIT: 1 Visit                   Lauraine Gills, PTA 12/09/23, 4:08 PM

## 2023-12-10 DIAGNOSIS — I639 Cerebral infarction, unspecified: Secondary | ICD-10-CM | POA: Diagnosis not present

## 2023-12-10 LAB — ALBUMIN: Albumin: 2.4 g/dL — ABNORMAL LOW (ref 3.5–5.0)

## 2023-12-10 LAB — BASIC METABOLIC PANEL WITH GFR
Anion gap: 16 — ABNORMAL HIGH (ref 5–15)
Anion gap: 12 (ref 5–15)
BUN: 40 mg/dL — ABNORMAL HIGH (ref 8–23)
BUN: 37 mg/dL — ABNORMAL HIGH (ref 8–23)
CO2: 19 mmol/L — ABNORMAL LOW (ref 22–32)
CO2: 19 mmol/L — ABNORMAL LOW (ref 22–32)
Calcium: 6.5 mg/dL — ABNORMAL LOW (ref 8.9–10.3)
Calcium: 6.4 mg/dL — CL (ref 8.9–10.3)
Chloride: 113 mmol/L — ABNORMAL HIGH (ref 98–111)
Chloride: 114 mmol/L — ABNORMAL HIGH (ref 98–111)
Creatinine, Ser: 3.16 mg/dL — ABNORMAL HIGH (ref 0.61–1.24)
Creatinine, Ser: 2.99 mg/dL — ABNORMAL HIGH (ref 0.61–1.24)
GFR, Estimated: 19 mL/min — ABNORMAL LOW (ref >60)
GFR, Estimated: 20 mL/min — ABNORMAL LOW (ref >60)
Glucose, Bld: 215 mg/dL — ABNORMAL HIGH (ref 70–99)
Glucose, Bld: 166 mg/dL — ABNORMAL HIGH (ref 70–99)
Potassium: 3.5 mmol/L (ref 3.5–5.1)
Potassium: 3.2 mmol/L — ABNORMAL LOW (ref 3.5–5.1)
Sodium: 148 mmol/L — ABNORMAL HIGH (ref 135–145)
Sodium: 145 mmol/L (ref 135–145)

## 2023-12-10 LAB — CBC
HCT: 43 % (ref 39.0–52.0)
Hemoglobin: 13.8 g/dL (ref 13.0–17.0)
MCH: 30.1 pg (ref 26.0–34.0)
MCHC: 32.1 g/dL (ref 30.0–36.0)
MCV: 93.9 fL (ref 80.0–100.0)
Platelets: 320 K/uL (ref 150–400)
RBC: 4.58 MIL/uL (ref 4.22–5.81)
RDW: 13.9 % (ref 11.5–15.5)
WBC: 7.7 K/uL (ref 4.0–10.5)
nRBC: 0 % (ref 0.0–0.2)

## 2023-12-10 LAB — MAGNESIUM: Magnesium: 2.1 mg/dL (ref 1.7–2.4)

## 2023-12-10 LAB — PHOSPHORUS: Phosphorus: 2.6 mg/dL (ref 2.5–4.6)

## 2023-12-10 LAB — GLUCOSE, CAPILLARY
Glucose-Capillary: 208 mg/dL — ABNORMAL HIGH (ref 70–99)
Glucose-Capillary: 202 mg/dL — ABNORMAL HIGH (ref 70–99)
Glucose-Capillary: 173 mg/dL — ABNORMAL HIGH (ref 70–99)
Glucose-Capillary: 161 mg/dL — ABNORMAL HIGH (ref 70–99)

## 2023-12-10 LAB — SODIUM: Sodium: 146 mmol/L — ABNORMAL HIGH (ref 135–145)

## 2023-12-10 MED ORDER — DEXTROSE 5 % IV SOLN: INTRAVENOUS | Status: AC

## 2023-12-10 MED ORDER — POTASSIUM CHLORIDE CRYS ER 20 MEQ PO TBCR
40.0000 meq | EXTENDED_RELEASE_TABLET | Freq: Once | ORAL | Status: DC
  Filled 2023-12-10: qty 2

## 2023-12-10 MED ORDER — MELATONIN 5 MG PO TABS
5.0000 mg | ORAL_TABLET | Freq: Every day | ORAL | Status: DC
  Administered 2023-12-10 – 2023-12-13 (×6): 5 mg via ORAL
  Filled 2023-12-10 (×4): qty 1

## 2023-12-10 MED ORDER — POTASSIUM CHLORIDE 20 MEQ PO PACK
40.0000 meq | PACK | Freq: Once | ORAL | Status: AC
  Administered 2023-12-10 (×2): 40 meq via ORAL
  Filled 2023-12-10: qty 2

## 2023-12-10 NOTE — Progress Notes (Signed)
 Pt had a critical lab of Calcium  at 6.4. Dr. Laree notified.

## 2023-12-10 NOTE — Plan of Care (Signed)
  Problem: Education: Goal: Ability to describe self-care measures that may prevent or decrease complications (Diabetes Survival Skills Education) will improve Outcome: Progressing Goal: Individualized Educational Video(s) Outcome: Progressing   Problem: Coping: Goal: Ability to adjust to condition or change in health will improve Outcome: Progressing   Problem: Fluid Volume: Goal: Ability to maintain a balanced intake and output will improve Outcome: Progressing   Problem: Health Behavior/Discharge Planning: Goal: Ability to identify and utilize available resources and services will improve Outcome: Progressing Goal: Ability to manage health-related needs will improve Outcome: Progressing   Problem: Metabolic: Goal: Ability to maintain appropriate glucose levels will improve Outcome: Progressing   Problem: Nutritional: Goal: Maintenance of adequate nutrition will improve Outcome: Progressing Goal: Progress toward achieving an optimal weight will improve Outcome: Progressing   Problem: Skin Integrity: Goal: Risk for impaired skin integrity will decrease Outcome: Progressing   Problem: Tissue Perfusion: Goal: Adequacy of tissue perfusion will improve Outcome: Progressing   Problem: Education: Goal: Knowledge of General Education information will improve Description: Including pain rating scale, medication(s)/side effects and non-pharmacologic comfort measures Outcome: Progressing   Problem: Health Behavior/Discharge Planning: Goal: Ability to manage health-related needs will improve Outcome: Progressing   Problem: Clinical Measurements: Goal: Ability to maintain clinical measurements within normal limits will improve Outcome: Progressing Goal: Will remain free from infection Outcome: Progressing Goal: Diagnostic test results will improve Outcome: Progressing Goal: Respiratory complications will improve Outcome: Progressing Goal: Cardiovascular complication will  be avoided Outcome: Progressing   Problem: Activity: Goal: Risk for activity intolerance will decrease Outcome: Progressing   Problem: Nutrition: Goal: Adequate nutrition will be maintained Outcome: Progressing   Problem: Coping: Goal: Level of anxiety will decrease Outcome: Progressing   Problem: Elimination: Goal: Will not experience complications related to bowel motility Outcome: Progressing Goal: Will not experience complications related to urinary retention Outcome: Progressing   Problem: Pain Managment: Goal: General experience of comfort will improve and/or be controlled Outcome: Progressing   Problem: Safety: Goal: Ability to remain free from injury will improve Outcome: Progressing   Problem: Skin Integrity: Goal: Risk for impaired skin integrity will decrease Outcome: Progressing   Problem: Education: Goal: Knowledge of disease or condition will improve Outcome: Progressing Goal: Knowledge of secondary prevention will improve (MUST DOCUMENT ALL) Outcome: Progressing Goal: Knowledge of patient specific risk factors will improve (DELETE if not current risk factor) Outcome: Progressing

## 2023-12-10 NOTE — Progress Notes (Addendum)
 [facility] Progress Note  Patient: Victor Gould    FMW:[id number]  DOA: 11/28/2023     Date of Service: the patient was seen and examined on 12/10/2023  Chief Complaint  Patient presents with   Altered Mental Status   Brief hospital course: YOVANNI FRENETTE is a 83 y.o. male with a PMH significant for CAD s/p CABG, HTN, afib, CKDIII, Type II DM, hypothyroidism, h/o prostate cancer, HLD, HF, h/o CVA, subdural hematoma (3 years prior), parkinson's disease. At baseline, they live with their wife and are independent with ADLs.   They presented from home to the ED on 11/28/2023 with confusion x1 days. Wife states that he has been using inappropriate words and actions throughout the day. He did not have any injury and he denies having any symptoms. He denies any focalized weakness or speech abnormalities. He endorses good adherence with his medications which includes aspirin , plavix , and eliquis .    ED w/up: VS significantly elevated blood pressure up to 216/84, mild bradycardia in high 50s. Otherwise stable.  Labs unremarkable CBC, glucose 252, Cr 3.43 ( roughly baseline) Brain MRI: 1. Small acute infarct in the left insula and many small acute infarcts in the overlying left parietal lobe (posterior left MCA territory). 2. Many remote infarcts, as detailed above. ( As shown in CT) 3. Chronic right vertebral artery occlusion.   Patient was admitted to medicine service for further workup and management of acute CVA as outlined in detail below.     Assessment and Plan:  Acute CVA - Patient presented with aphasia, AMS, no focal deficits - MRI brain Small acute infarct in the left insula and many small acute infarcts in the overlying left parietal lobe (posterior left MCA territory) - Neurology consulted, recommended to continue Eliquis  and Plavix  for CAD - Carotid duplex negative for any significant stenosis - TTE LVEF 50 to 55%, grade 1 diastolic dysfunction, no WMA, negative  PFO. - SLP eval done, patient is on regular diet, Requested re-eval today as patient having aspiration with thin liquids - will have risk of aspiration due to Parkinson's. Encouraged to give pills in puree - PT and OT following, initially recommended home health but due to mental status changes patient will require SNF placement. - Follow TOC for SNF placement  Sundowning and delirium with agitation Dementia with Parkinson's disease - patient was given a dose of Haldol  on 08/04, was very sleepy - Avoid Haldol  use due to Parkinson's disease - Seen by Neurology on 08/08, no further imaging recommended - need to reset his circadian cycle gradually - Change Seroquel  25 daily at bedtime to 12.5 mg prn at bedtime, add Melatonin - Continue Sinemet  and Aricept  - Delirium precautions - Palliative consult for GOC discussion  AKI on CKD stage IV - Baseline sCr 2.64 which was 2 year ago  - Cr 3.16, Baseline Cr appears to be 3.0 - 3.2 - Encourage oral hydration, on IV fluids as patient has very poor po intake - Bladder scan negative for retention - Monitor urine output and BMP daily  Metabolic acidosis AGMA - suspect due to starvation ketoacidosis - Likely 2/2 AKI on CKD - S/p Sodium bicarb amp - Inc bicarb tabs 1300 TID - Check BMP  Hypernatremia - Na inc to 150, due to poor po intake - encouraged po intake, change to 0.45% NS to D5W 75 cc/hr - Free water  deficit 1.5L - Repeat Na in 6 hrs  Hypokalemia - Monitor and replete as needed - Mag normal  Hypophosphatemia - Monitor and replete as needed. - Corrected Calcium  7.8  HTN- severely elevated - S/p permissive hypertension for 48 hours - On Amlodipine  10mg , Bisoprolol  5mg , Inc Losartan  75 mg - Hydralazine  IV vs po prn - Monitor BP and titrate medications accordingly  Type II DM - sSSI - A1c 7.4 well-controlled   Parkinsons - continue home sinemet    Hypothyroidism - continue home synthroid   Diarrhea, as per his wife that  patient has diarrhea since admission, now resolved X-ray abdomen did not show any acute findings Started probiotics, discontinue Imodium  and monitor  RUE swelling - improving - US  RUE neg for DVT - Elevation of RUE  Body mass index is 23.64 kg/m.  Interventions:  Diet: Regular diet DVT Prophylaxis: Eliquis   Advance goals of care discussion: Full code  Family Communication: family was present at bedside, at the time of interview.   discussed with patient's wife at bedside.  Disposition:  Pt is from Home, admitted with Acute CVA, and AKI, on IVF,  which precludes a safe discharge. Discharge to SNF, when bed will be available.     Subjective: No significant events overnight, as per staff patient slept well overnight. Was more awake today than in the past few days. Spouse hopeful that mental status will improve and can tolerate po and discharge to SNF  Physical Exam: General: NAD, lying comfortably Appear in no distress, affect appropriate Neck: no JVD,  Cardiovascular: S1 and S2 Present, no Murmur,  Respiratory: good respiratory effort, Bilateral Air entry equal and Decreased, no Crackles, no wheezes Abdomen: Bowel Sound present, Soft and no tenderness,  Skin: no rashes Extremities: no Pedal edema, no calf tenderness Neurologic: without any new focal findings, More awake today    Vitals:   12/09/23 2350 12/10/23 0507 12/10/23 0808 12/10/23 1208  BP: (!) 164/81 (!) 166/74 (!) 158/73 (!) 158/73  Pulse: 72 67 72 68  Resp: 18 18 16 16   Temp: 98.4 F (36.9 C) 97.8 F (36.6 C) (!) 97.4 F (36.3 C) 98 F (36.7 C)  TempSrc:      SpO2: 99% 96% 95% 96%  Weight:      Height:        Intake/Output Summary (Last 24 hours) at 12/10/2023 1520 Last data filed at 12/10/2023 1300 Gross per 24 hour  Intake 777.78 ml  Output --  Net 777.78 ml    Filed Weights   11/28/23 1836  Weight: 72.6 kg    Data Reviewed: I have personally reviewed and interpreted daily labs, tele  strips, imagings as discussed above. I reviewed all nursing notes, pharmacy notes, vitals, pertinent old records I have discussed plan of care as described above with RN and patient/family.  CBC: Recent Labs  Lab 12/06/23 0443 12/07/23 0338 12/08/23 0523 12/09/23 0438 12/10/23 0539  WBC 12.3* 14.3* 11.2* 10.1 7.7  HGB 14.2 14.3 13.5 13.3 13.8  HCT 43.3 44.3 40.9 40.6 43.0  MCV 94.1 93.7 92.7 93.3 93.9  PLT 245 265 264 295 320   Basic Metabolic Panel: Recent Labs  Lab 12/06/23 0443 12/07/23 0338 12/07/23 1645 12/08/23 0523 12/08/23 1652 12/09/23 0438 12/09/23 1616 12/10/23 0003 12/10/23 0539  NA 144 146*   < > 148* 147* 148* 150* 146* 148*  K 3.7 3.7   < > 3.4* 3.6 3.4* 3.8  --  3.5  CL 108 113*   < > 110 111 116* 115*  --  113*  CO2 17* 18*   < > 19* 17* 18* 18*  --  19*  GLUCOSE 184* 166*   < > 156* 170* 168* 163*  --  215*  BUN 36* 36*   < > 41* 44* 44* 44*  --  40*  CREATININE 3.42* 3.28*   < > 3.29* 3.45* 3.39* 3.31*  --  3.16*  CALCIUM  7.1* 7.0*   < > 6.6* 6.5* 6.5* 6.8*  --  6.5*  MG 1.9 2.0  --  2.2  --  2.0  --   --  2.1  PHOS 2.7 2.6  --  2.2*  --  2.4*  --   --  2.6   < > = values in this interval not displayed.    Studies: No results found.    Scheduled Meds:  amLODipine   10 mg Oral Daily   apixaban   2.5 mg Oral BID   atorvastatin   40 mg Oral Daily   bisoprolol   5 mg Oral Daily   carbidopa -levodopa   1 tablet Oral TID   clopidogrel   75 mg Oral Daily   donepezil   5 mg Oral QHS   feeding supplement  237 mL Oral BID BM   insulin  aspart  0-9 Units Subcutaneous TID WC   levothyroxine   75 mcg Oral Q0600   losartan   75 mg Oral Daily   potassium chloride   40 mEq Oral Once   saccharomyces boulardii  250 mg Oral BID   sodium bicarbonate   1,300 mg Oral TID   sodium chloride  flush  3 mL Intravenous Once   Continuous Infusions:  dextrose  75 mL/hr at 12/10/23 1134     PRN Meds: acetaminophen  **OR** acetaminophen , albuterol , bisacodyl , hydrALAZINE   **OR** hydrALAZINE , liver oil-zinc  oxide, ondansetron  **OR** ondansetron  (ZOFRAN ) IV, QUEtiapine   Time spent: 40 minutes  Author: Jerriah Ines. MD [facility] 12/10/2023 3:20 PM  To reach On-call, see care teams to locate the attending and reach out to them via [email or web address]. If 7PM-7AM, please contact night-coverage If you still have difficulty reaching the attending provider, please page the [facility] (Director on Call) for [facility] on amion for assistance.

## 2023-12-10 NOTE — Plan of Care (Signed)
 Speech therapy recommended to find and alternative for nutritional intake because PO is very difficult at this point.

## 2023-12-10 NOTE — Progress Notes (Signed)
 Physical Therapy Treatment Patient Details Name: Victor Gould MRN: [id number] DOB: [birth year] Today's Date: 12/10/2023   History of Present Illness Pt is an 83 y.o. male presented from home to the ED on 11/28/2023 with confusion and using inappropriate words and actions throughout the day. 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 PMH significant for CAD s/p CABG, HTN, afib, CKDIII, Type II DM, hypothyroidism, h/o prostate cancer, HLD, HF, h/o CVA, subdural hematoma (3 years prior), parkinson's disease.    PT Comments  Pt received in bed and is very lethargic. PT provided constant stimulation throughout session to keep pt awake. Pt able to stand at the EOB with max physical A x 1. Pt inconsistently follows one step commands and requires constant cuing for sequencing and safety. Pt able to transfer from bed>chair with mod A for RW and line management. Would benefit from skilled PT to promote optimal return to PLOF.     If plan is discharge home, recommend the following: A lot of help with walking and/or transfers;A little help with bathing/dressing/bathroom;Assistance with cooking/housework;Assistance with feeding;Assist for transportation;Help with stairs or ramp for entrance   Can travel by private vehicle     No  Equipment Recommendations  None recommended by PT    Recommendations for Other Services       Precautions / Restrictions Precautions Precautions: Fall Recall of Precautions/Restrictions: Impaired Restrictions Weight Bearing Restrictions Per Provider Order: No     Mobility  Bed Mobility Overal bed mobility: Needs Assistance Bed Mobility: Supine to Sit Rolling: Mod assist   Supine to sit: Max assist, Used rails, HOB elevated     General bed mobility comments: Pt is very lethargic and will respond incoherently. Pt required mod-max for rolling and used bed rails to sit. PT provided max A for trunk management. When  sitting at EOB pt is able to maintain balance.    Transfers Overall transfer level: Needs assistance Equipment used: Rolling walker (2 wheels) Transfers: Bed to chair/wheelchair/BSC Sit to Stand: Max assist   Step pivot transfers: Mod assist       General transfer comment: Mod assist for RW management. Pt required  verbal and tactile cuing for sequencing and hand placement. Pt stands with a forward flexed posture and required cuing to correct form    Ambulation/Gait               General Gait Details: NT d/t patient being very lethargic and safety concern.   Stairs         General stair comments: NT   Wheelchair Mobility     Tilt Bed    Modified Rankin (Stroke Patients Only)       Balance Overall balance assessment: Needs assistance Sitting-balance support: Bilateral upper extremity supported, Feet supported Sitting balance-Leahy Scale: Fair Sitting balance - Comments: tatile cuing for hand placement for UE support. Postural control: Posterior lean Standing balance support: Bilateral upper extremity supported, Reliant on assistive device for balance, During functional activity Standing balance-Leahy Scale: Poor Standing balance comment: BUE from RW. Pt able to maintain balance standing balance. Nursing team came in while standing and patient was able to tolerate prolonged stand.                            Communication Communication Communication: Impaired Factors Affecting Communication: Hearing impaired;Difficulty expressing self;Reduced clarity of speech  Cognition Arousal: Lethargic Behavior During Therapy:  WFL for tasks assessed/performed   PT - Cognitive impairments: History of cognitive impairments, Sequencing, Initiation, Safety/Judgement                       PT - Cognition Comments: Pt is sleepy but wakes up with stimulation. Agreeable to PT session plan. Following commands: Impaired Following commands impaired: Follows  one step commands inconsistently    Cueing Cueing Techniques: Verbal cues, Tactile cues, Visual cues  Exercises      General Comments        Pertinent Vitals/Pain Pain Assessment Pain Assessment: No/denies pain    Home Living                          Prior Function            PT Goals (current goals can now be found in the care plan section) Acute Rehab PT Goals Patient Stated Goal: to go home PT Goal Formulation: With patient Time For Goal Achievement: 12/13/23 Potential to Achieve Goals: Fair Progress towards PT goals: PT to reassess next treatment    Frequency    Min 2X/week      PT Plan      Co-evaluation              AM-PAC PT 6 Clicks Mobility   Outcome Measure  Help needed turning from your back to your side while in a flat bed without using bedrails?: Total Help needed moving from lying on your back to sitting on the side of a flat bed without using bedrails?: A Lot Help needed moving to and from a bed to a chair (including a wheelchair)?: A Lot Help needed standing up from a chair using your arms (e.g., wheelchair or bedside chair)?: A Lot Help needed to walk in hospital room?: A Lot Help needed climbing 3-5 steps with a railing? : A Lot 6 Click Score: 11    End of Session Equipment Utilized During Treatment: Gait belt Activity Tolerance: Patient limited by lethargy Patient left: in chair;with chair alarm set;with call bell/phone within reach;with family/visitor present;with nursing/sitter in room Nurse Communication: Mobility status PT Visit Diagnosis: Unsteadiness on feet (R26.81);History of falling (Z91.81)     Time: 8577-8552 PT Time Calculation (min) (ACUTE ONLY): 25 min  Charges:                            Caren Garske, SPT    Khair Chasteen 12/10/2023, 5:10 PM

## 2023-12-10 NOTE — TOC Progression Note (Signed)
 Transition of Care [facility]) - Progression Note    Patient Details  Name: Victor Gould MRN: [id number] Date of Birth: July 25, 1940  Transition of Care [facility]) CM/SW Contact  Dalia GORMAN Fuse, RN Phone Number: 12/10/2023, 3:33 PM  Clinical Narrative:     Plan to dc to [facility] when medically ready.  Expected Discharge Plan: Home w Home Health Services Barriers to Discharge: Continued Medical Work up               Expected Discharge Plan and Services   Discharge Planning Services: CM Consult Post Acute Care Choice: Home Health Living arrangements for the past 2 months: Single Family Home                           HH Arranged: PT, OT, RN [facility] Agency: [facility] Date [facility] Agency Contacted: 12/02/23 Time HH Agency Contacted: [id number] Representative spoke with at [facility] Agency: [place]   Social Drivers of Health (SDOH) Interventions SDOH Screenings   Food Insecurity: Patient Unable To Answer (11/29/2023)  Housing: Low Risk  (12/07/2023)  Transportation Needs: Unmet Transportation Needs (11/29/2023)  Utilities: Patient Unable To Answer (11/29/2023)  Financial Resource Strain: Low Risk  (11/11/2023)   Received from [facility]  Social Connections: Patient Unable To Answer (11/29/2023)  Tobacco Use: Low Risk  (12/07/2023)    Readmission Risk Interventions     No data to display

## 2023-12-10 NOTE — Evaluation (Addendum)
 Clinical/Bedside Swallow Evaluation Patient Details  Name: Victor Gould MRN: [id number] Date of Birth: 11/06/1940  Today's Date: 12/10/2023 Time: SLP Start Time (ACUTE ONLY): 1410 SLP Stop Time (ACUTE ONLY): 1510 SLP Time Calculation (min) (ACUTE ONLY): 60 min  Past Medical History:  Past Medical History:  Diagnosis Date   ASCVD (arteriosclerotic cardiovascular disease)    Cancer (HCC)    prostate   Chronic kidney disease    Colon polyps    Constipation    Coronary artery disease    Diabetes mellitus    Family history of adverse reaction to anesthesia    children - PONV   GERD (gastroesophageal reflux disease)    Hemorrhoids    Hyperlipidemia    Hypertension    PCP Dr Oneil Pinal at [place]   Hypothyroidism    MI (myocardial infarction) (HCC)    PONV (postoperative nausea and vomiting)    Thyroid disease    Vertigo    positional   Past Surgical History:  Past Surgical History:  Procedure Laterality Date   APPENDECTOMY     CATARACT EXTRACTION W/PHACO Right 10/27/2018   Procedure: CATARACT EXTRACTION PHACO AND INTRAOCULAR LENS PLACEMENT (IOC) RIGHT, DIABETIC;  Surgeon: Jaye Elsie, MD;  Location: [facility] SURGERY CNTR;  Service: Ophthalmology;  Laterality: Right;  diabetic - insulin    CATARACT EXTRACTION W/PHACO Left 11/25/2018   Procedure: CATARACT EXTRACTION PHACO AND INTRAOCULAR LENS PLACEMENT (IOC) LEFT DIABETIC;  Surgeon: Jaye Elsie, MD;  Location: [facility];  Service: Ophthalmology;  Laterality: Left;   COLONOSCOPY     colonoscopy with polypectomy     COLONOSCOPY WITH PROPOFOL  N/A 12/02/2017   Procedure: COLONOSCOPY WITH PROPOFOL ;  Surgeon: Viktoria Lamar DASEN, MD;  Location: [facility] ENDOSCOPY;  Service: Endoscopy;  Laterality: N/A;   CORONARY ARTERY BYPASS GRAFT N/A 08/28/2016   Procedure: CORONARY ARTERY BYPASS GRAFTING (CABG) x five, using left internal mammary artery and right leg greater saphenous vein harvested endoscopically - -LIMA to LAD, -SVG  to OM, -SVG to DIAGONAL, -SEQ SVG to PDA, PLVB ;  Surgeon: Dallas KATHEE Jude, MD;  Location: [facility] OR;  Service: Open Heart Surgery;  Laterality: N/A;   CYSTOSCOPY N/A 09/29/2013   Procedure: CYSTOSCOPY;  Surgeon: Glendia DELENA Elizabeth, MD;  Location: [facility] ORS;  Service: Urology;  Laterality: N/A;   ESOPHAGOGASTRODUODENOSCOPY (EGD) WITH PROPOFOL  N/A 12/02/2017   Procedure: ESOPHAGOGASTRODUODENOSCOPY (EGD) WITH PROPOFOL ;  Surgeon: Viktoria Lamar DASEN, MD;  Location: [facility] ENDOSCOPY;  Service: Endoscopy;  Laterality: N/A;   HERNIA REPAIR     inguinal bilaterally   LEFT HEART CATH AND CORONARY ANGIOGRAPHY N/A 08/27/2016   Procedure: Left Heart Cath and Coronary Angiography;  Surgeon: Victory LELON Sharps, MD;  Location: [facility] INVASIVE CV LAB;  Service: Cardiovascular;  Laterality: N/A;   ROBOT ASSISTED LAPAROSCOPIC RADICAL PROSTATECTOMY  06/25/2011   Procedure: ROBOTIC ASSISTED LAPAROSCOPIC RADICAL PROSTATECTOMY LEVEL 2;  Surgeon: Noretta Ferrara, MD;  Location: [facility] ORS;  Service: Urology;  Laterality: N/A;   TEE WITHOUT CARDIOVERSION N/A 08/28/2016   Procedure: TRANSESOPHAGEAL ECHOCARDIOGRAM (TEE);  Surgeon: Dallas KATHEE Jude, MD;  Location: [facility] OR;  Service: Open Heart Surgery;  Laterality: N/A;   UPPER GASTROINTESTINAL ENDOSCOPY     URETHRAL SLING N/A 09/29/2013   Procedure: MALE SLING;  Surgeon: Glendia DELENA Elizabeth, MD;  Location: [facility] ORS;  Service: Urology;  Laterality: N/A;   HPI:  Pt is a 83 y.o. male with a PMH significant for Parkinson's Dis w/ Cognitive decline (on ARICEPT  per chart)- pt's Wife stated pt had memory changes  per his Neurologist, CAD s/p CABG, HTN, afib, GERD, CKDIII, Type II DM, hypothyroidism, h/o prostate cancer, HLD, HF, h/o CVA, subdural hematoma (3 years prior).    At admit:  MRI- 1. Small acute infarct in the left insula and many small acute  infarcts in the overlying left parietal lobe (posterior left MCA  territory).  2. Many remote infarcts, as detailed above. ( As shown in CT)  3. Chronic right  vertebral artery occlusion.   Also in the ED, patient is essentially asymptomatic and at baseline on exam. No focal neurological finding different than his baseline per pt and wife at bedside's report. He was disoriented to the year, per MD note.     Currently on Day 11 of admit: Wife reported pt has not really eaten in 4 days.  NSG feels pt has declined in status and refuses po's at times.     Assessment / Plan / Recommendation  Clinical Impression   Pt seen for BSE today s/p concern for coughing when drinking liquids during attempt at swallowing Medications this morning w/ NSG- pt was attempting to swallow Pills w/ water  per report.  Pt has Baseline issues impacting swallowing that include: Parkinson's Dis and Cognitive decline; Deconditioning and Poor oral intake/desire; Dependency on support/encouragement at meals. Pt asleep/drowsy w/ eyes closed upon entering room and calling his name; he awakened w/ verbal/tactile stim and w/ positioning upright in bed(initially slumped down in bed upon entering room). Pt was minimally verbal and returned to eyes closed immediately post po trials. He followed instructions w/ much encouragement/cues. He initially DECLINED po trials when offered- Wife stated pt has not really eaten in 4 days.  On RA, afebrile.   Pt appears to present w/ grossly functional oropharyngeal phase swallowing w/ min slower oral phase presentation overall; No pharyngeal phase dysphagia noted when following general aspiration precautions, No overt neuromuscular deficits noted. Pt consumed po trials (ACCEPTED) w/ No overt, clinical s/s of aspiration during po trials.  Pt appears at reduced risk for aspiration following general aspiration precautions and appears to benefit from a modified foods diet for ease of oral intake. He presents w/ challenging factors that could impact oropharyngeal swallowing to include fatigue/weakness/deconditioning, advanced Age as well as Parkinson's Dis and  Cognitive decline; Deconditioning and Poor oral intake/desire; Dependency on support/encouragement at meals. These factors can increase risk for dysphagia as well as decreased oral intake overall.   During po trials accepted, pt consumed all consistencies w/ no overt coughing, decline in vocal quality, or change in respiratory presentation during/post trials. O2 sats 98% when checked. Oral phase appeared c/b min prolonged bolus management, mastication, and decreased awareness for bolus efficiency. Adequate control of bolus propulsion for A-P transfer for swallowing noted. Oral clearing achieved w/ all trial consistencies -- moistened, soft foods given and alternated w/ a sip of thin liquid.  OM Exam appeared [facility] w/ no unilateral weakness noted. Speech Clear. Pt helped to hold Cup to drink but did not attempt to help feed self given setup support.   Recommend a more MINCED consistency diet(Dysphagia level 2) w/ well-moistened foods; Thin liquids -- carefully monitor drinking and pt should help to Hold Cup when drinking. NO Straws. Recommend general aspiration precautions to include reduce distractions, sitting Fully Upright for all oral intake, Small bites/sips Slowly. Support and Supervision w/ feeding- Encouragement to take po's at meals/snacks. GERD precautions. Pills WHOLE vs CRUSHED in Puree for safer, easier swallowing -- it is encouraged now and for D/C to the  Wife, pt.  Education given on Pills in Puree; food consistencies and easy to eat options; general aspiration precautions to pt and Wife. Initial discussion was had w/ Wife on the concern of pt being unable to maintain nutrition/hydration needs orally and the need for discussion of GOC w/ MD/Palliative Care(including alternative means of feeding). Palliative Care order was requested from MD. ST services will monitor pt's status over next 2-3 days for appropriateness for diet upgrade; and/or at next venue of care.  NSG updated, agreed. MD updated.  Recommend Dietician f/u for support and calorie count. Precautions posted in chart, room. SLP Visit Diagnosis: Dysphagia, oropharyngeal phase (R13.12) (in setting of Parkinson's Dis and Cognitive decline; Deconditioning and Poor oral intake/desire; Dependency on support/encouragement at meals)    Aspiration Risk  Mild aspiration risk;Risk for inadequate nutrition/hydration (reduced following general aspiration precautions)    Diet Recommendation   Thin by CUP; Dysphagia 2 (MINCED w/ gravies to moisten/flavor) = aspiration precautions including NO Straws, sitting Fully Upright for all oral intake, Small bites/sips Slowly. Reduce distractions at meals. Support and Supervision w/ feeding- Encouragement to take po's at meals/snacks. GERD precautions.   Medication Administration: Whole meds with puree (vs Crushed in puree)    Other  Recommendations Recommended Consults:  (Palliative Care consult for discussion of GOC including alternative means of feeding; Dietician f/u) Oral Care Recommendations: Oral care BID;Oral care before and after PO;Staff/trained caregiver to provide oral care     Assistance Recommended at Discharge  FULL d/t Cognitive decline; deconditioning  Functional Status Assessment Patient has had a recent decline in their functional status and/or demonstrates limited ability to make significant improvements in function in a reasonable and predictable amount of time  Frequency and Duration min 2x/week  2 weeks       Prognosis Prognosis for improved oropharyngeal function: Fair Barriers to Reach Goals: Cognitive deficits;Motivation;Language deficits;Time post onset;Behavior;Severity of deficits Barriers/Prognosis Comment: Parkinson's Dis and Cognitive decline; Deconditioning and Poor oral intake/desire; Dependency on support/encouragement at meals      Swallow Study   General Date of Onset: 11/28/23 HPI: Pt is a 83 y.o. male with a PMH significant for Parkinson's Dis w/  Cognitive decline (on ARICEPT  per chart)- pt's Wife stated pt had memory changes per his Neurologist, CAD s/p CABG, HTN, afib, CKDIII, Type II DM, hypothyroidism, h/o prostate cancer, HLD, HF, h/o CVA, subdural hematoma (3 years prior).   At admit:  MRI- 1. Small acute infarct in the left insula and many small acute  infarcts in the overlying left parietal lobe (posterior left MCA  territory).  2. Many remote infarcts, as detailed above. ( As shown in CT)  3. Chronic right vertebral artery occlusion.  Also in the ED, patient is essentially asymptomatic and at baseline on exam. No focal neurological finding different than his baseline per pt and wife at bedside's report. He was disoriented to the year, per MD note.   Currently on day 11 of admit: Wife reported pt has not really eaten in 4 days.  NSG feels pt has declined in status and refuses po's at times. Type of Study: Bedside Swallow Evaluation Previous Swallow Assessment: No previous swallow evaluation.  Pt had cognitive-linguistic ST therapy in 2022 and 2024(recently addressing cognitive communication and dysarthria goals). Also assessed for needs this admit s/p stroke- recommendation for f/u at next venue of care if needs. Diet Prior to this Study: Regular;Thin liquids (Level 0) (per MD order this admit) Temperature Spikes Noted: No (wbc 7.7) Respiratory Status: Room  air History of Recent Intubation: No Behavior/Cognition: Alert;Cooperative;Confused;Lethargic/Drowsy;Requires cueing (Encouragement required) Oral Cavity Assessment: Within Functional Limits Oral Care Completed by SLP: Recent completion by staff Oral Cavity - Dentition: Adequate natural dentition Vision: Functional for self-feeding Self-Feeding Abilities: Able to feed self;Needs assist;Total assist;Needs set up Patient Positioning: Upright in bed (Full support) Baseline Vocal Quality: Low vocal intensity (min) Volitional Swallow: Able to elicit    Oral/Motor/Sensory  Function Overall Oral Motor/Sensory Function: Within functional limits (no unilateral weakness noted during bolus management and clearing)   Ice Chips Ice chips: Within functional limits Presentation: Spoon (fed; 2 trials)   Thin Liquid Thin Liquid: Impaired Presentation: Cup;Self Fed (5 trials accepted) Oral Phase Impairments: Poor awareness of bolus Oral Phase Functional Implications: Prolonged oral transit (1/5 trials) Pharyngeal  Phase Impairments:  (no overt s/s)    Nectar Thick Nectar Thick Liquid: Not tested   Honey Thick Honey Thick Liquid: Not tested   Puree Puree: Within functional limits (grossly) Presentation: Spoon (fed; 4 trials accepted)   Solid     Solid: Impaired (min) Presentation: Spoon (fed; 3 trials accepted) Oral Phase Functional Implications:  (prolonged time (min)) Pharyngeal Phase Impairments:  (no overt s/s) Other Comments: he declined further        Comer Portugal, MS, CCC-SLP Speech Language Pathologist Rehab Services; [facility] [phone] (ascom) Monserath Neff 12/10/2023,6:20 PM

## 2023-12-10 NOTE — Progress Notes (Signed)
 Physical Therapy Treatment Patient Details Name: Victor Gould MRN: [id number] DOB: June 04, 1940 Today's Date: 12/10/2023   History of Present Illness Pt is an 83 y.o. male presented from home to the ED on 11/28/2023 with confusion and using inappropriate words and actions throughout the day. 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 PMH significant for CAD s/p CABG, HTN, afib, CKDIII, Type II DM, hypothyroidism, h/o prostate cancer, HLD, HF, h/o CVA, subdural hematoma (3 years prior), parkinson's disease.    PT Comments  Pt received in bed and is very lethargic. PT provided constant stimulation throughout session to keep pt awake. Pt able to stand at the EOB with max physical A x 1. Pt inconsistently follows one step commands and requires constant cuing for sequencing and safety. Pt able to transfer from bed>chair with mod A for RW and line management. Would benefit from skilled PT to promote optimal return to PLOF.     If plan is discharge home, recommend the following: A lot of help with walking and/or transfers;A little help with bathing/dressing/bathroom;Assistance with cooking/housework;Assistance with feeding;Assist for transportation;Help with stairs or ramp for entrance   Can travel by private vehicle     No  Equipment Recommendations  None recommended by PT    Recommendations for Other Services       Precautions / Restrictions Precautions Precautions: Fall Recall of Precautions/Restrictions: Impaired Restrictions Weight Bearing Restrictions Per Provider Order: No     Mobility  Bed Mobility Overal bed mobility: Needs Assistance Bed Mobility: Supine to Sit Rolling: Mod assist   Supine to sit: Max assist, Used rails, HOB elevated     General bed mobility comments: Pt is very lethargic and will respond incoherently. Pt required mod-max for rolling and used bed rails to sit. PT provided max A for trunk management. When  sitting at EOB pt is able to maintain balance.    Transfers Overall transfer level: Needs assistance Equipment used: Rolling walker (2 wheels) Transfers: Bed to chair/wheelchair/BSC Sit to Stand: Max assist   Step pivot transfers: Mod assist       General transfer comment: Mod assist for RW management. Pt required  verbal and tactile cuing for sequencing and hand placement. Pt stands with a forward flexed posture and required cuing to correct form    Ambulation/Gait               General Gait Details: NT d/t patient being very lethargic and safety concern.   Stairs         General stair comments: NT   Wheelchair Mobility     Tilt Bed    Modified Rankin (Stroke Patients Only)       Balance Overall balance assessment: Needs assistance Sitting-balance support: Bilateral upper extremity supported, Feet supported Sitting balance-Leahy Scale: Fair Sitting balance - Comments: tatile cuing for hand placement for UE support. Postural control: Posterior lean Standing balance support: Bilateral upper extremity supported, Reliant on assistive device for balance, During functional activity Standing balance-Leahy Scale: Poor Standing balance comment: BUE from RW. Pt able to maintain balance standing balance. Nursing team came in while standing and patient was able to tolerate prolonged stand.                            Communication Communication Communication: Impaired Factors Affecting Communication: Hearing impaired;Difficulty expressing self;Reduced clarity of speech  Cognition Arousal: Lethargic Behavior During Therapy:  WFL for tasks assessed/performed   PT - Cognitive impairments: History of cognitive impairments, Sequencing, Initiation, Safety/Judgement                       PT - Cognition Comments: Pt is sleepy but wakes up with stimulation. Agreeable to PT session plan. Following commands: Impaired Following commands impaired: Follows  one step commands inconsistently    Cueing Cueing Techniques: Verbal cues, Tactile cues, Visual cues  Exercises      General Comments        Pertinent Vitals/Pain Pain Assessment Pain Assessment: No/denies pain    Home Living                          Prior Function            PT Goals (current goals can now be found in the care plan section) Acute Rehab PT Goals Patient Stated Goal: to go home PT Goal Formulation: With patient Time For Goal Achievement: 12/13/23 Potential to Achieve Goals: Fair Progress towards PT goals: PT to reassess next treatment    Frequency    Min 2X/week      PT Plan      Co-evaluation              AM-PAC PT 6 Clicks Mobility   Outcome Measure  Help needed turning from your back to your side while in a flat bed without using bedrails?: Total Help needed moving from lying on your back to sitting on the side of a flat bed without using bedrails?: A Lot Help needed moving to and from a bed to a chair (including a wheelchair)?: A Lot Help needed standing up from a chair using your arms (e.g., wheelchair or bedside chair)?: A Lot Help needed to walk in hospital room?: A Lot Help needed climbing 3-5 steps with a railing? : A Lot 6 Click Score: 11    End of Session Equipment Utilized During Treatment: Gait belt Activity Tolerance: Patient limited by lethargy Patient left: in chair;with chair alarm set;with call bell/phone within reach;with family/visitor present;with nursing/sitter in room Nurse Communication: Mobility status PT Visit Diagnosis: Unsteadiness on feet (R26.81);History of falling (Z91.81)     Time: 8577-8552 PT Time Calculation (min) (ACUTE ONLY): 25 min  Charges:                            Damoni Causby, SPT    Abbe Bula 12/10/2023, 5:10 PM

## 2023-12-11 DIAGNOSIS — Z515 Encounter for palliative care: Secondary | ICD-10-CM

## 2023-12-11 DIAGNOSIS — E440 Moderate protein-calorie malnutrition: Secondary | ICD-10-CM | POA: Insufficient documentation

## 2023-12-11 DIAGNOSIS — G20A1 Parkinson's disease without dyskinesia, without mention of fluctuations: Secondary | ICD-10-CM

## 2023-12-11 DIAGNOSIS — I639 Cerebral infarction, unspecified: Secondary | ICD-10-CM | POA: Diagnosis not present

## 2023-12-11 LAB — GLUCOSE, CAPILLARY
Glucose-Capillary: 281 mg/dL — ABNORMAL HIGH (ref 70–99)
Glucose-Capillary: 246 mg/dL — ABNORMAL HIGH (ref 70–99)
Glucose-Capillary: 181 mg/dL — ABNORMAL HIGH (ref 70–99)
Glucose-Capillary: 273 mg/dL — ABNORMAL HIGH (ref 70–99)

## 2023-12-11 LAB — BASIC METABOLIC PANEL WITH GFR
Anion gap: 9 (ref 5–15)
BUN: 33 mg/dL — ABNORMAL HIGH (ref 8–23)
CO2: 22 mmol/L (ref 22–32)
Calcium: 6.4 mg/dL — CL (ref 8.9–10.3)
Chloride: 112 mmol/L — ABNORMAL HIGH (ref 98–111)
Creatinine, Ser: 3.06 mg/dL — ABNORMAL HIGH (ref 0.61–1.24)
GFR, Estimated: 19 mL/min — ABNORMAL LOW (ref >60)
Glucose, Bld: 250 mg/dL — ABNORMAL HIGH (ref 70–99)
Potassium: 3.5 mmol/L (ref 3.5–5.1)
Sodium: 143 mmol/L (ref 135–145)

## 2023-12-11 LAB — MAGNESIUM: Magnesium: 1.9 mg/dL (ref 1.7–2.4)

## 2023-12-11 MED ORDER — ADULT MULTIVITAMIN W/MINERALS CH
1.0000 | ORAL_TABLET | Freq: Every day | ORAL | Status: DC
  Administered 2023-12-12 – 2023-12-13 (×2): 1 via ORAL
  Filled 2023-12-11 (×3): qty 1

## 2023-12-11 MED ORDER — ENSURE PLUS HIGH PROTEIN PO LIQD
237.0000 mL | Freq: Three times a day (TID) | ORAL | Status: DC
  Administered 2023-12-11 – 2023-12-13 (×4): 237 mL via ORAL

## 2023-12-11 MED ORDER — CALCITRIOL 0.25 MCG PO CAPS
0.5000 ug | ORAL_CAPSULE | Freq: Every day | ORAL | Status: DC
  Administered 2023-12-11 – 2023-12-14 (×5): 0.5 ug via ORAL
  Filled 2023-12-11 (×4): qty 2

## 2023-12-11 MED ORDER — CALCIUM GLUCONATE-NACL 1-0.675 GM/50ML-% IV SOLN
1.0000 g | Freq: Once | INTRAVENOUS | Status: AC
  Administered 2023-12-11 (×2): 1000 mg via INTRAVENOUS
  Filled 2023-12-11: qty 50

## 2023-12-11 NOTE — Plan of Care (Signed)
  Problem: Education: Goal: Ability to describe self-care measures that may prevent or decrease complications (Diabetes Survival Skills Education) will improve Outcome: Progressing Goal: Individualized Educational Video(s) Outcome: Progressing   Problem: Coping: Goal: Ability to adjust to condition or change in health will improve Outcome: Progressing   Problem: Fluid Volume: Goal: Ability to maintain a balanced intake and output will improve Outcome: Progressing   Problem: Health Behavior/Discharge Planning: Goal: Ability to identify and utilize available resources and services will improve Outcome: Progressing Goal: Ability to manage health-related needs will improve Outcome: Progressing   Problem: Metabolic: Goal: Ability to maintain appropriate glucose levels will improve Outcome: Progressing   Problem: Nutritional: Goal: Maintenance of adequate nutrition will improve Outcome: Progressing Goal: Progress toward achieving an optimal weight will improve Outcome: Progressing   Problem: Skin Integrity: Goal: Risk for impaired skin integrity will decrease Outcome: Progressing   Problem: Tissue Perfusion: Goal: Adequacy of tissue perfusion will improve Outcome: Progressing   Problem: Education: Goal: Knowledge of General Education information will improve Description: Including pain rating scale, medication(s)/side effects and non-pharmacologic comfort measures Outcome: Progressing   Problem: Health Behavior/Discharge Planning: Goal: Ability to manage health-related needs will improve Outcome: Progressing   Problem: Clinical Measurements: Goal: Ability to maintain clinical measurements within normal limits will improve Outcome: Progressing Goal: Will remain free from infection Outcome: Progressing Goal: Diagnostic test results will improve Outcome: Progressing Goal: Respiratory complications will improve Outcome: Progressing Goal: Cardiovascular complication will  be avoided Outcome: Progressing   Problem: Activity: Goal: Risk for activity intolerance will decrease Outcome: Progressing   Problem: Nutrition: Goal: Adequate nutrition will be maintained Outcome: Progressing   Problem: Coping: Goal: Level of anxiety will decrease Outcome: Progressing   Problem: Elimination: Goal: Will not experience complications related to bowel motility Outcome: Progressing Goal: Will not experience complications related to urinary retention Outcome: Progressing   Problem: Pain Managment: Goal: General experience of comfort will improve and/or be controlled Outcome: Progressing   Problem: Safety: Goal: Ability to remain free from injury will improve Outcome: Progressing   Problem: Skin Integrity: Goal: Risk for impaired skin integrity will decrease Outcome: Progressing   Problem: Education: Goal: Knowledge of disease or condition will improve Outcome: Progressing Goal: Knowledge of secondary prevention will improve (MUST DOCUMENT ALL) Outcome: Progressing Goal: Knowledge of patient specific risk factors will improve (DELETE if not current risk factor) Outcome: Progressing

## 2023-12-11 NOTE — Progress Notes (Signed)
 OT Cancellation Note  Patient Details Name: CURREN MOHRMANN MRN: [id number] DOB: 01/04/1941   Cancelled Treatment:    Reason Eval/Treat Not Completed: Medical issues which prohibited therapy. Most recent lab values show calcium  level of 6.4, which is below the threshold for participation in rehab services, per [facility] guidelines. Will resume OT at a later date, as pt is medically appropriate.  Suzen Hock 12/11/2023, 1:43 PM

## 2023-12-11 NOTE — Hospital Course (Addendum)
 83yo with h/o CAD s/p CABG, HTN, afib, stage 4 CKD, DM, hypothyroidism, prostate CA, HLD, CVA/SDH, and Parkinson's disease who presented on 7/31 with AMS.  MRI with small acute infarct in L insula and many small acute infarcts in L parietal loba as well as many remote infarcts and chronic R vertebral artery occlusion.  Hospital complicated by aspiration with thin liquids.  He will require SNF placement.

## 2023-12-11 NOTE — Progress Notes (Signed)
 Progress Note   Patient: Victor Gould FMW:[id number] DOB: 05-09-1940 DOA: 11/28/2023     12 DOS: the patient was seen and examined on 12/11/2023   Brief hospital course: 83yo with h/o CAD s/p CABG, HTN, afib, stage 4 CKD, DM, hypothyroidism, prostate CA, HLD, CVA/SDH, and Parkinson's disease who presented on 7/31 with AMS.  MRI with small acute infarct in L insula and many small acute infarcts in L parietal loba as well as many remote infarcts and chronic R vertebral artery occlusion.  Hospital complicated by aspiration with thin liquids.  He will require SNF placement.  Assessment and Plan:  Acute CVA - Patient presented with aphasia, AMS, no focal deficits - MRI brain Small acute infarct in the left insula and many small acute infarcts in the overlying left parietal lobe (posterior left MCA territory) - Neurology consulted, recommended to continue Eliquis  and Plavix  for CAD - Carotid duplex negative for any significant stenosis - TTE LVEF 50 to 55%, grade 1 diastolic dysfunction, no WMA, negative PFO. - SLP eval done, patient is on regular diet, Requested re-eval as patient having aspiration with thin liquids - will have risk of aspiration due to Parkinson's. Encouraged to give pills in puree. - PT and OT following, initially recommended home health but due to mental status changes patient will require SNF placement. - Follow TOC for SNF placement post-PEG tube placement  Moderate malnutrition Nutrition Problem: Moderate Malnutrition Etiology: chronic illness (Parkinson's, heart failure) Signs/Symptoms: mild fat depletion, moderate fat depletion, mild muscle depletion, moderate muscle depletion Interventions: Ensure Enlive (each supplement provides 350kcal and 20 grams of protein), MVI, Magic cup He appears to not be able to meet his nutritional needs via PO intake at this time After prolonged family discussion, his wife is certain that she wants a PEG tube placed IR consulted for PEG  tube Dietician has already made tube feeding recommendations for once this is in place    Sundowning and delirium with agitation Dementia with Parkinson's disease - Patient was given a dose of Haldol  on 08/04, was very sleepy; avoid further Haldol  use due to Parkinson's disease - Seen by Neurology on 08/08, no further imaging recommended - need to reset his circadian cycle gradually - Change Seroquel  25 daily at bedtime to 12.5 mg prn at bedtime, add Melatonin - Continue Sinemet  and Aricept  - Delirium precautions - Palliative consult for GOC discussion - patient's wife and son (MD) are certain that he should remain full code and full scope of care at this time and are requesting PEG tube placement   CKD stage IV - Baseline sCr appears to be 3.0 - 3.2 - Encourage oral hydration, on IV fluids as patient has very poor po intake - Bladder scan negative for retention - Continue to follow after placement of PEG tube and initiation of tube feeds   Metabolic acidosis - Likely 2/2 starvation ketosis and CKD - S/p Sodium bicarb amp - Bicarb tabs 1300 TID, improving - Check BMP daily for now   Hypocalcemia -Will give IV calcium  gluconate 1 gram plus Calcitriol  0.5 microgram/day -Will check ionized calcium  in AM and continue to replete as needed   HTN- severely elevated, improving - S/p permissive hypertension for 48 hours - On Amlodipine  10mg , Bisoprolol  5mg , Inc Losartan  75 mg - Hydralazine  IV vs po prn - Monitor BP and titrate medications accordingly  Type II DM - sSSI - A1c 7.4 well-controlled   Parkinsons - continue home sinemet    Hypothyroidism - continue home synthroid   RUE swelling - improving - US  RUE neg for DVT - Elevation of RUE    Consultants: Neurology PT OT SLP  Procedures: Carotid US  8/1 Echocardiogram 8/3 RUE DVT US  8/9  Antibiotics: None  30 Day Unplanned Readmission Risk Score    Flowsheet Row ED to Hosp-Admission (Current) from 11/28/2023 in  [facility] 1C MEDICAL TELEMETRY  30 Day Unplanned Readmission Risk Score (%) 28.9 Filed at 12/11/2023 0400    This score is the patient's risk of an unplanned readmission within 30 days of being discharged (0 -100%). The score is based on dignosis, age, lab data, medications, orders, and past utilization.   Low:  0-14.9   Medium: 15-21.9   High: 22-29.9   Extreme: 30 and above           Subjective: Alert, not really oriented to person (repeated his last name but didn't know his first name).  No obvious complaints.  I spoke at length with his wife about GOC.   Objective: Vitals:   12/10/23 1945 12/11/23 0021  BP: (!) 162/80 (!) 148/60  Pulse: 73 70  Resp: 18 18  Temp: 98 F (36.7 C) 98.3 F (36.8 C)  SpO2: 97% 93%    Intake/Output Summary (Last 24 hours) at 12/11/2023 0735 Last data filed at 12/11/2023 0300 Gross per 24 hour  Intake 530.24 ml  Output --  Net 530.24 ml   Filed Weights   11/28/23 1836  Weight: 72.6 kg    Exam:  General:  Appears calm and comfortable and is in NAD Eyes:  normal lids, iris ENT:  grossly normal hearing, lips & tongue, mmm Cardiovascular:  RRR. No LE edema.  Respiratory:   CTA bilaterally with no wheezes/rales/rhonchi.  Normal respiratory effort. Abdomen:  soft, NT, ND Skin:  no rash or induration seen on limited exam Musculoskeletal:  grossly normal tone BUE/BLE, good ROM, no bony abnormality Psychiatric:  confused mood and affect, speech with expressive aphasia Neurologic:  CN 2-12 grossly intact, moves all extremities in coordinated fashion  Data Reviewed: I have reviewed the patient's lab results since admission.  Pertinent labs for today include:   K+ 3.5 Glucose 112 Glucose 250 BUN 33/Creatinine 3.06/GFR 19, stable Calcium  6.4     Family Communication: Wife was present  Disposition: Status is: Inpatient Admit - It is my clinical opinion that admission to INPATIENT is reasonable and necessary  because of the expectation that this patient will require hospital care that crosses at least 2 midnights to treat this condition based on the medical complexity of the problems presented.  Given the aforementioned information, the predictability of an adverse outcome is felt to be significant.      Time spent: 75 minutes  Unresulted Labs (From admission, onward)     Start     Ordered   12/12/23 0500  Basic metabolic panel with GFR  Tomorrow morning,   R       Question:  Specimen collection method  Answer:  Lab=Lab collect   12/11/23 0735   12/12/23 0500  CBC with Differential/Platelet  Tomorrow morning,   R       Question:  Specimen collection method  Answer:  Lab=Lab collect   12/11/23 0735   12/12/23 0500  Calcium , ionized  Tomorrow morning,   R       Question:  Specimen collection method  Answer:  Lab=Lab collect   12/11/23 0735             Author: Delon  Barbarann, MD 12/11/2023 7:35 AM  For on call review [email or web address].

## 2023-12-11 NOTE — Progress Notes (Addendum)
 Initial Nutrition Assessment  DOCUMENTATION CODES:   Non-severe (moderate) malnutrition in context of chronic illness  INTERVENTION:   -Obtain new wt -48 hour calorie count per MD -MVI with minerals daily -Ensure Plus High Protein po TID, each supplement provides 350 kcal and 20 grams of protein  -Magic cup TID with meals, each supplement provides 290 kcal and 9 grams of protein  -Feeding assistance with meals -If feeding tube placed, recommend:  Initiate Osmolite 1.5 @ 20 ml/hr and increase by 10 ml every 12 hours to goal rate of 50 ml/hr.   60 ml Prosource TF daily  30 ml free water  flush every 4 hours  Tube feeding regimen provides 1880 kcal (100% of needs), 95 grams of protein, and 914 ml of H2O. Total free water : 1094 ml daily   -If feedings are initiated, recommend MVI daily, 100 mg thiamine daily x 7 days, and monitoring Mg, K, and Phos and replete as needed due to high refeeding risk  NUTRITION DIAGNOSIS:   Moderate Malnutrition related to chronic illness (Parkinson's, heart failure) as evidenced by mild fat depletion, moderate fat depletion, mild muscle depletion, moderate muscle depletion.  GOAL:   Patient will meet greater than or equal to 90% of their needs  MONITOR:   PO intake, Supplement acceptance, Diet advancement  REASON FOR ASSESSMENT:   Consult Calorie Count  ASSESSMENT:   Pt with PMH significant for CAD s/p CABG, HTN, afib, CKDIII, Type II DM, hypothyroidism, h/o prostate cancer, HLD, HF, h/o CVA, subdural hematoma (3 years prior), parkinson's disease.  Pt admitted with acute CVA.   8/12- s/p BSE- dysphagia 2 diet with thin liquids  Reviewed I/O's: +530 ml x 24 hours and +1.7 L since admission  Per MD notes, pt presented with aphasia, AMS, and no focal deficits. MRI of brain revealed small acute infarct in the left insula and many small acute infarcts in the overlying left parietal lobe (posterior left MCA territory).   Pt sleeping soundly  at time of visit, but aroused easily to voice. No family present at time of visit. Pt reports feeling ok today. Pt reports that he does not think he ate anything today; he does not have any difficulty chewing or swallowing, stating he is just not hungry. Observed breakfast tray at bedside. RD offered to set up tray and assist with meal, however, pt politely declined, stating I'll just wait. Pt reports that his wife is usually present for meals and helps assist with meals.   Case discussed with SLP, who reports pt has been struggling with adequate oral intake since admission. Palliative care consult has been ordered for goals of care. A calorie count has been requested to provide objective data for pt's oral intake. SLP reports that pt wife is learning towards PEG.   Unsure if most recent wt is a recorded wt vs stated weight. RD will obtain new wt to better assess weight trends.   8/12 Breakfast: 113 kcals, 6 grams protein Lunch: 0% Dinner: 0%  Total intake: 113 kcal (6% of minimum estimated needs)  6 grams protein (7% of minimum estimated needs)  Given pt's advanced age, Parkinsons', dementia, and malnutrition, RD would be hesitant to recommend artificial means of nutrition/ hydration as this would not enhance pt's quality of life. Findings and recommendations discussed with team.   Medications reviewed and include sinemet , florastor, and dextrose  5% solution @ 65 ml/hr.   Lab Results  Component Value Date   HGBA1C 7.4 (H) 11/28/2023   PTA DM  medications are 5 units insulin  glargine daily, 10 units insulin  glargine daily, and 5-11 units insulin  lispro TID.   Labs reviewed: CBGS: 161-281 (inpatient orders for glycemic control are 0-9 units insulin  aspart TID with meals). Noted DM coordinator recommendation from this AM to ordered 5 units insulin  glargine-yfgn daily.   NUTRITION - FOCUSED PHYSICAL EXAM:  Flowsheet Row Most Recent Value  Orbital Region Mild depletion  Upper Arm Region  Mild depletion  Thoracic and Lumbar Region Mild depletion  Buccal Region Mild depletion  Temple Region Moderate depletion  Clavicle Bone Region Mild depletion  Clavicle and Acromion Bone Region Mild depletion  Scapular Bone Region Mild depletion  Dorsal Hand Moderate depletion  Patellar Region Moderate depletion  Anterior Thigh Region Moderate depletion  Posterior Calf Region Moderate depletion  Edema (RD Assessment) Mild  Hair Reviewed  Eyes Reviewed  Mouth Reviewed  Skin Reviewed  Nails Reviewed    Diet Order:   Diet Order             DIET DYS 2 Room service appropriate? Yes with Assist; Fluid consistency: Thin  Diet effective now                   EDUCATION NEEDS:   Not appropriate for education at this time  Skin:  Skin Assessment: Reviewed RN Assessment  Last BM:  12/09/23 (type 7)  Height:   Ht Readings from Last 1 Encounters:  12/07/23 5' 9 (1.753 m)    Weight:   Wt Readings from Last 1 Encounters:  11/28/23 72.6 kg    Ideal Body Weight:  72.7 kg  BMI:  Body mass index is 23.64 kg/m.  Estimated Nutritional Needs:   Kcal:  1800-2000  Protein:  90-105 grams  Fluid:  1.8-2.0 L    Margery ORN, RD, LDN, CDCES Registered Dietitian III Certified Diabetes Care and Education Specialist If unable to reach this RD, please use RD Inpatient group chat on secure chat between hours of 8am-4 pm daily

## 2023-12-11 NOTE — Consult Note (Signed)
 Consultation Note Date: 12/11/2023 at 1500  Patient Name: Victor Gould  DOB: [birth year]  MRN: [id number]  Age / Sex: 83 y.o., male  PCP: Cleotilde Oneil FALCON, MD Referring Physician: Barbarann Nest, MD  HPI/Patient Profile: 83 y.o. male  with past medical history of CAD s/p CABG, HTN, afib, CKDIII, Type II DM, hypothyroidism, h/o prostate cancer, HLD, HF, h/o CVA, subdural hematoma (3 years prior), parkinson's disease, and dementia admitted on 11/28/2023 with confusion X 1 day.   Workup revealed small acute infarcts.  Brain MRI: 1. Small acute infarct in the left insula and many small acute infarcts in the overlying left parietal lobe (posterior left MCA territory). 2. Many remote infarcts, as detailed above. ( As shown in CT) 3. Chronic right vertebral artery occlusion.  Patient is being treated for acute CVA, sundowning and delirium with agitation, AKI, metabolic acidosis, electrolyte imbalances, and diarrhea (resolved).   PMT was consulted to support patient and family with GOC discussions.   Clinical Assessment and Goals of Care: Extensive chart review completed prior to meeting patient including labs, vital signs, imaging, progress notes, orders, and available advanced directive documents from current and previous encounters. I then met with patient Victor Gould and his wife Victor Gould at bedside to discuss diagnosis prognosis, GOC, EOL wishes, disposition and options.  I introduced Palliative Medicine as specialized medical care for people living with serious illness. It focuses on providing relief from the symptoms and stress of a serious illness. The goal is to improve quality of life for both the patient and the family.  We discussed a brief life review of the patient. Victor Gould and Victor Gould have been married for 63 years. They have four children. They are from [place]  but also lived in [place]. 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   We discussed patient's current illness and what it means in the larger context of patient's on-going co-morbidities.  Education provided on dementia and Parkinson's that is chronic, progressive, and irreversible diseases.   I highlighted that I am aware of discussions that patient's wife had with attending Dr. Barbarann earlier today.  I conveyed that Victor Gould wishes are for patient to remain full code and full scope.  Victor Gould confirms that she wishes for her husband to get all offered, available, and appropriate medical interventions to sustain his life.   I expressed my grave concern with patient's poor functional, nutritional, and cognitive status.  Victor Gould shares that patient is aware and able to understand everything going on around him, saying his mind is intact. Discussed his past and recent strokes as irreversible damage to nervous tissue.  Discussed importance of quality of life.  Victor Gould shares she is not supporting quality of life as a plan for her husband.  She speaks of that her faith in God and how we are all in God's hands no matter what we do.  She wishes to have a PEG placed, and remains hopeful that patient can get stronger.   Goals  are clear. Full code and full scope remain.   PMT will step back from daily visits, monitor the patient peripherally, and intervene/meet with patient/family when appropriate.  Please re-engage with PMT if goals change, at patient/family's request, or if patient's health deteriorates during hospitalization.    Primary Decision Maker NEXT OF KIN  Physical Exam Vitals reviewed.  HENT:     Head: Normocephalic.     Mouth/Throat:     Mouth: Mucous membranes are moist.  Pulmonary:     Effort: Pulmonary effort is normal.  Abdominal:     Palpations: Abdomen is soft.  Skin:    General: Skin is warm and dry.     Coloration: Skin is pale.  Neurological:     Comments:  nonverbal     Palliative Assessment/Data: 20-30%     Thank you for this consult. Palliative medicine will continue to follow and assist holistically.   Time Total: 75 minutes  Time spent includes: Detailed review of medical records (labs, imaging, vital signs), medically appropriate exam (mental status, respiratory, cardiac, skin), discussed with treatment team, counseling and educating patient, family and staff, documenting clinical information, medication management and coordination of care.  Signed by: Lamarr Gunner, DNP, FNP-BC Palliative Medicine   Please contact Palliative Medicine Team providers via [facility] for questions and concerns.

## 2023-12-11 NOTE — Inpatient Diabetes Management (Signed)
 Inpatient Diabetes Program Recommendations  AACE/ADA: New Consensus Statement on Inpatient Glycemic Control   Target Ranges:  Prepandial:   less than 140 mg/dL      Peak postprandial:   less than 180 mg/dL (1-2 hours)      Critically ill patients:  140 - 180 mg/dL    Latest Reference Range & Units 12/11/23 06:19  Glucose 70 - 99 mg/dL 749 (H)    Latest Reference Range & Units 12/10/23 08:10 12/10/23 12:07 12/10/23 18:18 12/10/23 19:44  Glucose-Capillary 70 - 99 mg/dL 791 (H) 797 (H) 826 (H) 161 (H)   Review of Glycemic Control  Diabetes history: DM2 Outpatient Diabetes medications: Lantus  5-10 units BID, Humalog 5-11 units TID Current orders for Inpatient glycemic control: Novolog  0-9 units TID with meals  Inpatient Diabetes Program Recommendations:    Insulin : Please consider ordering Semglee  5 units Q24H.  Thanks, Earnie Gainer, RN, MSN, CDCES Diabetes Coordinator Inpatient Diabetes Program [phone] (Team Pager from 8am to 5pm)

## 2023-12-12 DIAGNOSIS — I639 Cerebral infarction, unspecified: Secondary | ICD-10-CM | POA: Diagnosis not present

## 2023-12-12 LAB — CBC WITH DIFFERENTIAL/PLATELET
Abs Immature Granulocytes: 0.1 K/uL — ABNORMAL HIGH (ref 0.00–0.07)
Basophils Absolute: 0.1 K/uL (ref 0.0–0.1)
Basophils Relative: 1 %
Eosinophils Absolute: 0.1 K/uL (ref 0.0–0.5)
Eosinophils Relative: 2 %
HCT: 40.9 % (ref 39.0–52.0)
Hemoglobin: 13.7 g/dL (ref 13.0–17.0)
Immature Granulocytes: 1 %
Lymphocytes Relative: 16 %
Lymphs Abs: 1.2 K/uL (ref 0.7–4.0)
MCH: 30.2 pg (ref 26.0–34.0)
MCHC: 33.5 g/dL (ref 30.0–36.0)
MCV: 90.3 fL (ref 80.0–100.0)
Monocytes Absolute: 0.8 K/uL (ref 0.1–1.0)
Monocytes Relative: 10 %
Neutro Abs: 5.5 K/uL (ref 1.7–7.7)
Neutrophils Relative %: 70 %
Platelets: 288 K/uL (ref 150–400)
RBC: 4.53 MIL/uL (ref 4.22–5.81)
RDW: 13.1 % (ref 11.5–15.5)
WBC: 7.9 K/uL (ref 4.0–10.5)
nRBC: 0 % (ref 0.0–0.2)

## 2023-12-12 LAB — BASIC METABOLIC PANEL WITH GFR
Anion gap: 11 (ref 5–15)
BUN: 27 mg/dL — ABNORMAL HIGH (ref 8–23)
CO2: 22 mmol/L (ref 22–32)
Calcium: 6.9 mg/dL — ABNORMAL LOW (ref 8.9–10.3)
Chloride: 110 mmol/L (ref 98–111)
Creatinine, Ser: 2.83 mg/dL — ABNORMAL HIGH (ref 0.61–1.24)
GFR, Estimated: 21 mL/min — ABNORMAL LOW (ref >60)
Glucose, Bld: 220 mg/dL — ABNORMAL HIGH (ref 70–99)
Potassium: 3.2 mmol/L — ABNORMAL LOW (ref 3.5–5.1)
Sodium: 143 mmol/L (ref 135–145)

## 2023-12-12 LAB — GLUCOSE, CAPILLARY
Glucose-Capillary: 209 mg/dL — ABNORMAL HIGH (ref 70–99)
Glucose-Capillary: 161 mg/dL — ABNORMAL HIGH (ref 70–99)
Glucose-Capillary: 144 mg/dL — ABNORMAL HIGH (ref 70–99)

## 2023-12-12 MED ORDER — POTASSIUM CHLORIDE 20 MEQ PO PACK
60.0000 meq | PACK | Freq: Once | ORAL | Status: AC
  Administered 2023-12-12: 60 meq via ORAL
  Filled 2023-12-12: qty 3

## 2023-12-12 NOTE — Progress Notes (Addendum)
 Physical Therapy Treatment Patient Details Name: Victor Gould MRN: [id number] DOB: 12-Nov-1940 Today's Date: 12/12/2023   History of Present Illness Pt is an 83 y.o. male presented from home to the ED on 11/28/2023 with confusion and using inappropriate words and actions throughout the day. 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 PMH significant for CAD s/p CABG, HTN, afib, CKDIII, Type II DM, hypothyroidism, h/o prostate cancer, HLD, HF, h/o CVA, subdural hematoma (3 years prior), parkinson's disease.    PT Comments  Pt was laying diagonally in bed upon arrival. He had soaked in urine due to having removed condom cath. Pt is alert but only truly oriented to self. He was cooperative but lacks insight of situation. Was able to follow simple one step commands with vcs/tcs. Less overall assistance required to exit bed, stand to RW and ambulate. Pt required constant assistance for safety throughout. He presents with inconsistent tremors. When having tremors, they are violent but seemed to resolve quickly. Encouraged pt to stay in recliner for breakfast but he wanted to return to bed. RN staff made aware pt will need assist with eating and needs new condom cath.   DC recs remain appropriate to maximize independence and safety with all ADLs while decreasing caregiver burden.    If plan is discharge home, recommend the following: A lot of help with walking and/or transfers;A lot of help with bathing/dressing/bathroom;Assistance with cooking/housework;Assistance with feeding;Direct supervision/assist for medications management;Direct supervision/assist for financial management;Assist for transportation;Help with stairs or ramp for entrance;Supervision due to cognitive status   Can travel by private vehicle     No  Equipment Recommendations  Other (comment) (Defer to next level of care)       Precautions / Restrictions Precautions Precautions:  Fall Recall of Precautions/Restrictions: Impaired Restrictions Weight Bearing Restrictions Per Provider Order: No     Mobility  Bed Mobility Overal bed mobility: Needs Assistance Bed Mobility: Supine to Sit, Sit to Supine  Supine to sit: Mod assist, Used rails Sit to supine: Mod assist   Transfers Overall transfer level: Needs assistance Equipment used: Rolling walker (2 wheels) Transfers: Sit to/from Stand Sit to Stand: Mod assist  General transfer comment: Pt was able to stand with mod assist. severe tremors initially that resolve and then come intermittently    Ambulation/Gait Ambulation/Gait assistance: Min assist Gait Distance (Feet): 40 Feet Assistive device: Rolling walker (2 wheels) Gait Pattern/deviations: Shuffle, Trunk flexed, Narrow base of support Gait velocity: decreased  General Gait Details: Pt was able to ambulate with RW ~ 40. min assist for navigating RW around furniture and Vcs for slowing down. author limited distance due to pt's impulsivity.    Balance Overall balance assessment: Needs assistance Sitting-balance support: Bilateral upper extremity supported, Feet supported Sitting balance-Leahy Scale: Fair     Standing balance support: Bilateral upper extremity supported, During functional activity, Reliant on assistive device for balance Standing balance-Leahy Scale: Poor Standing balance comment: pt is at high risk of falls     Communication Communication Communication: Impaired Factors Affecting Communication: Hearing impaired;Difficulty expressing self;Reduced clarity of speech  Cognition Arousal: Alert Behavior During Therapy: WFL for tasks assessed/performed   PT - Cognitive impairments: History of cognitive impairments   Orientation impairments: Place, Time, Situation      PT - Cognition Comments: Pt was A but only oriented to self. Poor ability to make needs known. Following commands: Impaired Following commands impaired: Follows one  step commands  inconsistently    Cueing Cueing Techniques: Verbal cues, Tactile cues     General Comments General comments (skin integrity, edema, etc.): Pt did not want to stay in recliner. Author changed bed linens prior to pt returning to bed. pt's condom cath was removed priro to session and pt is extremely incontinent      Pertinent Vitals/Pain Pain Assessment Pain Assessment: PAINAD Breathing: occasional labored breathing, short period of hyperventilation Negative Vocalization: occasional moan/groan, low speech, negative/disapproving quality Facial Expression: smiling or inexpressive Body Language: tense, distressed pacing, fidgeting Consolability: distracted or reassured by voice/touch PAINAD Score: 4 Pain Location: unable to state location but does endorse Pain Intervention(s): Limited activity within patient's tolerance, Monitored during session, Premedicated before session, Repositioned     PT Goals (current goals can now be found in the care plan section) Acute Rehab PT Goals Patient Stated Goal: to go home Progress towards PT goals: Progressing toward goals    Frequency    Min 2X/week       Co-evaluation     PT goals addressed during session: Mobility/safety with mobility;Balance;Proper use of DME;Strengthening/ROM        AM-PAC PT 6 Clicks Mobility   Outcome Measure  Help needed turning from your back to your side while in a flat bed without using bedrails?: A Lot Help needed moving from lying on your back to sitting on the side of a flat bed without using bedrails?: A Lot Help needed moving to and from a bed to a chair (including a wheelchair)?: A Lot Help needed standing up from a chair using your arms (e.g., wheelchair or bedside chair)?: A Lot Help needed to walk in hospital room?: A Lot Help needed climbing 3-5 steps with a railing? : A Lot 6 Click Score: 12    End of Session   Activity Tolerance: Patient tolerated treatment well Patient  left: in bed;with call bell/phone within reach;with bed alarm set Nurse Communication: Mobility status PT Visit Diagnosis: Unsteadiness on feet (R26.81);History of falling (Z91.81)     Time: 9161-9143 PT Time Calculation (min) (ACUTE ONLY): 18 min  Charges:    $Gait Training: 8-22 mins PT General Charges $$ ACUTE PT VISIT: 1 Visit                     Rankin Essex PTA 12/12/23, 10:24 AM

## 2023-12-12 NOTE — Progress Notes (Signed)
 Nutrition Follow-up  DOCUMENTATION CODES:   Non-severe (moderate) malnutrition in context of chronic illness  INTERVENTION:   -D/c calorie count (pt consuming 14% of estimated kcal needs and 5% of estimated protein needs over the past 48 hours) -Continue MVI with minerals daily -Continue Ensure Plus High Protein po TID, each supplement provides 350 kcal and 20 grams of protein  -Continue Magic cup TID with meals, each supplement provides 290 kcal and 9 grams of protein  -Continue feeding assistance with meals -If feeding tube placed, recommend:   Initiate Osmolite 1.5 @ 20 ml/hr and increase by 10 ml every 12 hours to goal rate of 50 ml/hr.    60 ml Prosource TF daily   30 ml free water  flush every 4 hours   Tube feeding regimen provides 1880 kcal (100% of needs), 95 grams of protein, and 914 ml of H2O. Total free water : 1094 ml daily    -If feedings are initiated, recommend MVI daily, 100 mg thiamine daily x 7 days, and monitoring Mg, K, and Phos and replete as needed due to high refeeding risk  NUTRITION DIAGNOSIS:   Moderate Malnutrition related to chronic illness (Parkinson's, heart failure) as evidenced by mild fat depletion, moderate fat depletion, mild muscle depletion, moderate muscle depletion.  Ongoing  GOAL:   Patient will meet greater than or equal to 90% of their needs  Unmet  MONITOR:   PO intake, Supplement acceptance, Diet advancement  REASON FOR ASSESSMENT:   Consult Calorie Count  ASSESSMENT:   Pt with PMH significant for CAD s/p CABG, HTN, afib, CKDIII, Type II DM, hypothyroidism, h/o prostate cancer, HLD, HF, h/o CVA, subdural hematoma (3 years prior), parkinson's disease.  8/12- s/p BSE- dysphagia 2 diet with thin liquids   Reviewed I/O's: -1.2 L x 24 hours and +468 ml since admission  UOP: 1.2 L x 24 hours  8/12 Breakfast: 113 kcals, 6 grams protein Lunch: 0% Dinner: 0%   Total intake: 113 kcal (6% of minimum estimated needs)  6  grams protein (7% of minimum estimated needs)  8/13 Breakfast: 53 kcals, 1 gram protein Lunch: 0% documented Dinner: 408 kcals, 3 grams protein  Total intake: 408 kcal (23% of minimum estimated needs)  4 grams protein (4% of minimum estimated needs)  Average Total intake: 261 kcal (14% of minimum estimated needs)  5 grams protein (5% of minimum estimated needs)  Palliative care following; pt wife desire full scope care and desires PEG placement. She is hopeful that pt will continue to improve with rehab.   Medications reviewed and include sinemet , florastor, and sodium bicarbonate .   Labs reviewed: K: 3.2, CBGS: 161-281 (inpatient orders for glycemic control are 0-9 units insulin  aspart TID with meals). Per DM coordinator notes, recommending 5 units insulin  glargine-yfgn daily  Diet Order:   Diet Order             DIET DYS 2 Room service appropriate? Yes with Assist; Fluid consistency: Thin  Diet effective now                   EDUCATION NEEDS:   Not appropriate for education at this time  Skin:  Skin Assessment: Reviewed RN Assessment  Last BM:  12/11/23  Height:   Ht Readings from Last 1 Encounters:  12/07/23 5' 9 (1.753 m)    Weight:   Wt Readings from Last 1 Encounters:  12/11/23 77 kg    Ideal Body Weight:  72.7 kg  BMI:  Body mass index  is 25.07 kg/m.  Estimated Nutritional Needs:   Kcal:  1800-2000  Protein:  90-105 grams  Fluid:  1.8-2.0 L   Margery ORN, RD, LDN, CDCES Registered Dietitian III Certified Diabetes Care and Education Specialist If unable to reach this RD, please use RD Inpatient group chat on secure chat between hours of 8am-4 pm daily

## 2023-12-12 NOTE — Inpatient Diabetes Management (Signed)
 Inpatient Diabetes Program Recommendations  AACE/ADA: New Consensus Statement on Inpatient Glycemic Control (2015)  Target Ranges:  Prepandial:   less than 140 mg/dL      Peak postprandial:   less than 180 mg/dL (1-2 hours)      Critically ill patients:  140 - 180 mg/dL    Latest Reference Range & Units 12/11/23 09:10 12/11/23 11:35 12/11/23 17:05 12/11/23 20:24  Glucose-Capillary 70 - 99 mg/dL 718 (H)  5 units Novolog   246 (H)  3 units Novolog   181 (H)  2 units Novolog   273 (H)  (H): Data is abnormally high  Latest Reference Range & Units 12/12/23 07:48  Glucose-Capillary 70 - 99 mg/dL 790 (H)  (H): Data is abnormally high    Home DM Meds: Lantus  5-10 units BID Humalog 5-11 units TID   Current Orders: Novolog  Sensitive Correction Scale/ SSI (0-9 units) TID AC   Poor PO intake per documentation Takes Lantus  insulin  at home Plan for PEG placement   MD- Please consider starting low dose Semglee  5 units Q24H    --Will follow patient during hospitalization--  Adina Rudolpho Arrow RN, MSN, CDCES Diabetes Coordinator Inpatient Glycemic Control Team Team Pager: [phone] (8a-5p)

## 2023-12-12 NOTE — Progress Notes (Signed)
 Progress Note   Patient: Victor Gould FMW:[id number] DOB: 12-Jun-1940 DOA: 11/28/2023     13 DOS: the patient was seen and examined on 12/12/2023   Brief hospital course: 83yo with h/o CAD s/p CABG, HTN, afib, stage 4 CKD, DM, hypothyroidism, prostate CA, HLD, CVA/SDH, and Parkinson's disease who presented on 7/31 with AMS.  MRI with small acute infarct in L insula and many small acute infarcts in L parietal loba as well as many remote infarcts and chronic R vertebral artery occlusion.  Hospital complicated by aspiration with thin liquids.  He will require SNF placement.  Assessment and Plan:  Acute CVA Patient presented with aphasia, AMS, no focal deficits MRI brain Small acute infarct in the left insula and many small acute infarcts in the overlying left parietal lobe (posterior left MCA territory) Neurology consulted, recommended to continue Eliquis  and Plavix  for CAD Carotid duplex negative for any significant stenosis TTE LVEF 50 to 55%, grade 1 diastolic dysfunction, no WMA, negative PFO. SLP eval done, patient is on regular diet, Requested re-eval as patient having aspiration with thin liquids - will have risk of aspiration due to Parkinson's. Encouraged to give pills in puree. PT and OT following, initially recommended home health but due to mental status changes patient will require SNF placement. He is medically stable and awaiting placement at this time   Moderate malnutrition Nutrition Problem: Moderate Malnutrition Etiology: chronic illness (Parkinson's, heart failure) Signs/Symptoms: mild fat depletion, moderate fat depletion, mild muscle depletion, moderate muscle depletion Interventions: Ensure Enlive (each supplement provides 350kcal and 20 grams of protein), MVI, Magic cup After prolonged family discussion, his wife wanted PEG tube placed IR consulted for PEG tube However, he started eating some better and so his wife has opted to defer this for now and to continue to  encourage PO intake   Sundowning and delirium with agitation Dementia with Parkinson's disease Patient was given a dose of Haldol  on 08/04, was very sleepy; avoid further Haldol  use due to Parkinson's disease Seen by Neurology on 08/08, no further imaging recommended Also stopped Seroquel  as he appears to be cognitively improving without sedating medications Continue Sinemet  and Aricept  Delirium precautions Palliative consult for GOC discussion - patient's wife and son (MD) are certain that he should remain full code and full scope of care at this time    CKD stage IV Baseline sCr appears to be 3.0 - 3.2 Encourage oral hydration, on IV fluids as patient has very poor po intake Bladder scan negative for retention   Metabolic acidosis Likely 2/2 starvation ketosis and CKD S/p Sodium bicarb amp Bicarb tabs 1300 TID, improving Check BMP daily for now   Hypocalcemia Will give IV calcium  gluconate 1 gram plus Calcitriol  0.5 microgram/day Will check ionized calcium  and continue to replete as needed   HTN- severely elevated, improving S/p permissive hypertension for 48 hours On Amlodipine  10mg , Bisoprolol  5mg , Inc Losartan  75 mg Hydralazine  IV vs po prn Monitor BP and titrate medications accordingly  Type II DM sSSI A1c 7.4 well-controlled   Parkinsons Continue home sinemet    Hypothyroidism Continue home synthroid    RUE swelling - improving US  RUE neg for DVT Elevation of RUE       Consultants: Neurology IR Palliative care PT OT SLP   Procedures: Carotid US  8/1 Echocardiogram 8/3 RUE DVT US  8/9   Antibiotics: None  30 Day Unplanned Readmission Risk Score    Flowsheet Row ED to Hosp-Admission (Current) from 11/28/2023 in [facility] 1C  MEDICAL TELEMETRY  30 Day Unplanned Readmission Risk Score (%) 29.59 Filed at 12/12/2023 0801    This score is the patient's risk of an unplanned readmission within 30 days of being discharged (0 -100%).  The score is based on dignosis, age, lab data, medications, orders, and past utilization.   Low:  0-14.9   Medium: 15-21.9   High: 22-29.9   Extreme: 30 and above           Subjective: He is eating a little better today and his wife wants to defer the feeding tube for now.  She is hopeful that being in SNF will help him to get stronger and improve his appetite.   Objective: Vitals:   12/12/23 1258 12/12/23 1651  BP: (!) 154/74 (!) 173/78  Pulse: 73 69  Resp: 16 18  Temp: 98.9 F (37.2 C) 98.3 F (36.8 C)  SpO2: 96% 95%    Intake/Output Summary (Last 24 hours) at 12/12/2023 1807 Last data filed at 12/12/2023 0412 Gross per 24 hour  Intake 0 ml  Output 425 ml  Net -425 ml   Filed Weights   11/28/23 1836 12/11/23 1103  Weight: 72.6 kg 77 kg    Exam:   General:  Appears calm and comfortable and is in NAD Eyes:  normal lids, iris ENT:  grossly normal hearing, lips & tongue, mmm Cardiovascular:  RRR. No LE edema.  Respiratory:   CTA bilaterally with no wheezes/rales/rhonchi.  Normal respiratory effort. Abdomen:  soft, NT, ND Skin:  no rash or induration seen on limited exam Musculoskeletal:  grossly normal tone BUE/BLE, good ROM, no bony abnormality Psychiatric:  confused mood and affect, speech with expressive aphasia Neurologic:  CN 2-12 grossly intact, moves all extremities in coordinated fashion  Data Reviewed: I have reviewed the patient's lab results since admission.  Pertinent labs for today include:   K+ 3.2 Glucose 220 BUN 27/Creatinine 2.83/GFR 21, improved Normal CBC     Family Communication: Wife was present  Disposition: Status is: Inpatient Remains inpatient appropriate because: ongoing management     Time spent: 50 minutes  Unresulted Labs (From admission, onward)     Start     Ordered   12/12/23 0500  Calcium , ionized  Tomorrow morning,   R       Question:  Specimen collection method  Answer:  Lab=Lab collect   12/11/23 0735    Unscheduled  CBC with Differential/Platelet  Tomorrow morning,   R       Question:  Specimen collection method  Answer:  Lab=Lab collect   12/12/23 1807   Unscheduled  Basic metabolic panel with GFR  Tomorrow morning,   R       Question:  Specimen collection method  Answer:  Lab=Lab collect   12/12/23 1807             Author: Delon Herald, MD 12/12/2023 6:07 PM  For on call review [email or web address].

## 2023-12-13 DIAGNOSIS — I639 Cerebral infarction, unspecified: Secondary | ICD-10-CM | POA: Diagnosis not present

## 2023-12-13 LAB — GLUCOSE, CAPILLARY
Glucose-Capillary: 148 mg/dL — ABNORMAL HIGH (ref 70–99)
Glucose-Capillary: 230 mg/dL — ABNORMAL HIGH (ref 70–99)
Glucose-Capillary: 220 mg/dL — ABNORMAL HIGH (ref 70–99)
Glucose-Capillary: 135 mg/dL — ABNORMAL HIGH (ref 70–99)

## 2023-12-13 LAB — CBC WITH DIFFERENTIAL/PLATELET
Abs Immature Granulocytes: 0.09 K/uL — ABNORMAL HIGH (ref 0.00–0.07)
Basophils Absolute: 0 K/uL (ref 0.0–0.1)
Basophils Relative: 1 %
Eosinophils Absolute: 0.1 K/uL (ref 0.0–0.5)
Eosinophils Relative: 2 %
HCT: 38.6 % — ABNORMAL LOW (ref 39.0–52.0)
Hemoglobin: 12.9 g/dL — ABNORMAL LOW (ref 13.0–17.0)
Immature Granulocytes: 1 %
Lymphocytes Relative: 20 %
Lymphs Abs: 1.5 K/uL (ref 0.7–4.0)
MCH: 30.1 pg (ref 26.0–34.0)
MCHC: 33.4 g/dL (ref 30.0–36.0)
MCV: 90.2 fL (ref 80.0–100.0)
Monocytes Absolute: 0.8 K/uL (ref 0.1–1.0)
Monocytes Relative: 10 %
Neutro Abs: 5.1 K/uL (ref 1.7–7.7)
Neutrophils Relative %: 66 %
Platelets: 293 K/uL (ref 150–400)
RBC: 4.28 MIL/uL (ref 4.22–5.81)
RDW: 13.2 % (ref 11.5–15.5)
WBC: 7.6 K/uL (ref 4.0–10.5)
nRBC: 0 % (ref 0.0–0.2)

## 2023-12-13 LAB — BASIC METABOLIC PANEL WITH GFR
Anion gap: 11 (ref 5–15)
BUN: 24 mg/dL — ABNORMAL HIGH (ref 8–23)
CO2: 20 mmol/L — ABNORMAL LOW (ref 22–32)
Calcium: 7.2 mg/dL — ABNORMAL LOW (ref 8.9–10.3)
Chloride: 115 mmol/L — ABNORMAL HIGH (ref 98–111)
Creatinine, Ser: 2.86 mg/dL — ABNORMAL HIGH (ref 0.61–1.24)
GFR, Estimated: 21 mL/min — ABNORMAL LOW (ref >60)
Glucose, Bld: 196 mg/dL — ABNORMAL HIGH (ref 70–99)
Potassium: 3.9 mmol/L (ref 3.5–5.1)
Sodium: 146 mmol/L — ABNORMAL HIGH (ref 135–145)

## 2023-12-13 LAB — CALCIUM, IONIZED: Calcium, Ionized, Serum: 4.2 mg/dL — ABNORMAL LOW (ref 4.5–5.6)

## 2023-12-13 MED ORDER — ADULT MULTIVITAMIN LIQUID CH
15.0000 mL | Freq: Every day | ORAL | Status: DC
  Administered 2023-12-13: 15 mL via ORAL
  Filled 2023-12-13 (×2): qty 15

## 2023-12-13 NOTE — Plan of Care (Signed)
  Problem: Education: Goal: Ability to describe self-care measures that may prevent or decrease complications (Diabetes Survival Skills Education) will improve Outcome: Progressing Goal: Individualized Educational Video(s) Outcome: Progressing   Problem: Coping: Goal: Ability to adjust to condition or change in health will improve Outcome: Progressing   Problem: Fluid Volume: Goal: Ability to maintain a balanced intake and output will improve Outcome: Progressing   Problem: Health Behavior/Discharge Planning: Goal: Ability to identify and utilize available resources and services will improve Outcome: Progressing Goal: Ability to manage health-related needs will improve Outcome: Progressing   Problem: Metabolic: Goal: Ability to maintain appropriate glucose levels will improve Outcome: Progressing   Problem: Nutritional: Goal: Maintenance of adequate nutrition will improve Outcome: Progressing Goal: Progress toward achieving an optimal weight will improve Outcome: Progressing   Problem: Skin Integrity: Goal: Risk for impaired skin integrity will decrease Outcome: Progressing   Problem: Tissue Perfusion: Goal: Adequacy of tissue perfusion will improve Outcome: Progressing   Problem: Education: Goal: Knowledge of General Education information will improve Description: Including pain rating scale, medication(s)/side effects and non-pharmacologic comfort measures Outcome: Progressing   Problem: Health Behavior/Discharge Planning: Goal: Ability to manage health-related needs will improve Outcome: Progressing   Problem: Clinical Measurements: Goal: Ability to maintain clinical measurements within normal limits will improve Outcome: Progressing Goal: Will remain free from infection Outcome: Progressing Goal: Diagnostic test results will improve Outcome: Progressing Goal: Respiratory complications will improve Outcome: Progressing Goal: Cardiovascular complication will  be avoided Outcome: Progressing   Problem: Activity: Goal: Risk for activity intolerance will decrease Outcome: Progressing   Problem: Nutrition: Goal: Adequate nutrition will be maintained Outcome: Progressing   Problem: Coping: Goal: Level of anxiety will decrease Outcome: Progressing   Problem: Elimination: Goal: Will not experience complications related to bowel motility Outcome: Progressing Goal: Will not experience complications related to urinary retention Outcome: Progressing   Problem: Pain Managment: Goal: General experience of comfort will improve and/or be controlled Outcome: Progressing   Problem: Safety: Goal: Ability to remain free from injury will improve Outcome: Progressing   Problem: Skin Integrity: Goal: Risk for impaired skin integrity will decrease Outcome: Progressing   Problem: Education: Goal: Knowledge of disease or condition will improve Outcome: Progressing Goal: Knowledge of secondary prevention will improve (MUST DOCUMENT ALL) Outcome: Progressing Goal: Knowledge of patient specific risk factors will improve (DELETE if not current risk factor) Outcome: Progressing

## 2023-12-13 NOTE — Progress Notes (Signed)
 Physical Therapy Treatment Patient Details Name: Victor Gould MRN: [id number] DOB: 11-16-1940 Today's Date: 12/13/2023   History of Present Illness Pt is an 83 y.o. male presented from home to the ED on 11/28/2023 with confusion and using inappropriate words and actions throughout the day. 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 PMH significant for CAD s/p CABG, HTN, afib, CKDIII, Type II DM, hypothyroidism, h/o prostate cancer, HLD, HF, h/o CVA, subdural hematoma (3 years prior), parkinson's disease.    PT Comments  Pt appears to be asleep the entire time dino is in the room, but he does intermittently respond verbally. Pt refuses to open his eyes when cued, simply exclaims I'm awake!. Wife at bedside throughout. ModA to EOB, pt unable to achieve steady unsupported sitting. MinA back into bed after several attempts to throw self back into a supine position. Pt set up for comfort and upright, HOB >40 degrees at exit. Will attempt to check in again later in day to see if more alertable.    If plan is discharge home, recommend the following: A lot of help with walking and/or transfers;A lot of help with bathing/dressing/bathroom;Assistance with cooking/housework;Assistance with feeding;Direct supervision/assist for medications management;Direct supervision/assist for financial management;Assist for transportation;Help with stairs or ramp for entrance;Supervision due to cognitive status   Can travel by private vehicle     No  Equipment Recommendations  None recommended by PT    Recommendations for Other Services       Precautions / Restrictions Precautions Precautions: Fall Recall of Precautions/Restrictions: Impaired Restrictions Weight Bearing Restrictions Per Provider Order: No     Mobility  Bed Mobility Overal bed mobility: Modified Independent, Needs Assistance Bed Mobility: Supine to Sit, Sit to Supine     Supine to  sit: Mod assist Sit to supine: Min assist   General bed mobility comments: follows 50-75% of cues to comes to EOB, but unable to sit unsupported over several minutes, cues, repeat attempts. Pt impulsively tries to return to lying in bed several times    Transfers Overall transfer level:  (unable to attempt due to sustained somnolence, eyes wide shut)                      Ambulation/Gait                   Stairs             Wheelchair Mobility     Tilt Bed    Modified Rankin (Stroke Patients Only)       Balance                                            Communication    Cognition                                        Cueing    Exercises      General Comments        Pertinent Vitals/Pain Pain Assessment Pain Assessment: No/denies pain    Home Living                          Prior Function  PT Goals (current goals can now be found in the care plan section) Acute Rehab PT Goals Patient Stated Goal: to go home PT Goal Formulation: With patient Time For Goal Achievement: 12/13/23 Potential to Achieve Goals: Poor Progress towards PT goals: Not progressing toward goals - comment    Frequency    Min 2X/week      PT Plan      Co-evaluation              AM-PAC PT 6 Clicks Mobility   Outcome Measure  Help needed turning from your back to your side while in a flat bed without using bedrails?: A Lot Help needed moving from lying on your back to sitting on the side of a flat bed without using bedrails?: A Lot Help needed moving to and from a bed to a chair (including a wheelchair)?: Total Help needed standing up from a chair using your arms (e.g., wheelchair or bedside chair)?: Total Help needed to walk in hospital room?: Total Help needed climbing 3-5 steps with a railing? : Total 6 Click Score: 8    End of Session   Activity Tolerance: Patient limited by  lethargy Patient left: in bed;with call bell/phone within reach;with bed alarm set Nurse Communication: Mobility status PT Visit Diagnosis: Unsteadiness on feet (R26.81);History of falling (Z91.81)     Time: 1050-1103 PT Time Calculation (min) (ACUTE ONLY): 13 min  Charges:    $Therapeutic Activity: 8-22 mins PT General Charges $$ ACUTE PT VISIT: 1 Visit                    12:22 PM, 12/13/23 Peggye JAYSON Linear, PT, DPT Physical Therapist - [facility]  [phone] (ASCOM)    Jonny Longino C 12/13/2023, 12:18 PM

## 2023-12-13 NOTE — Progress Notes (Signed)
 End of Shift:  Patient had a good morning and was more alert today according to his Wife. He was able to eat breakfast with assist and also took pills with applesauce. He did have a couple slight coughs with the big pills. Patient has not had any seizure or stroke like activity or symptoms. Wife at bedside.

## 2023-12-13 NOTE — Plan of Care (Signed)
  Problem: Education: Goal: Ability to describe self-care measures that may prevent or decrease complications (Diabetes Survival Skills Education) will improve Outcome: Progressing   Problem: Coping: Goal: Ability to adjust to condition or change in health will improve Outcome: Progressing   Problem: Fluid Volume: Goal: Ability to maintain a balanced intake and output will improve Outcome: Progressing   Problem: Health Behavior/Discharge Planning: Goal: Ability to identify and utilize available resources and services will improve Outcome: Progressing   Problem: Metabolic: Goal: Ability to maintain appropriate glucose levels will improve Outcome: Progressing   Problem: Skin Integrity: Goal: Risk for impaired skin integrity will decrease Outcome: Progressing   Problem: Tissue Perfusion: Goal: Adequacy of tissue perfusion will improve Outcome: Progressing   Problem: Health Behavior/Discharge Planning: Goal: Ability to manage health-related needs will improve Outcome: Progressing   Problem: Activity: Goal: Risk for activity intolerance will decrease Outcome: Progressing   Problem: Nutrition: Goal: Adequate nutrition will be maintained Outcome: Progressing   Problem: Coping: Goal: Level of anxiety will decrease Outcome: Progressing   Problem: Elimination: Goal: Will not experience complications related to bowel motility Outcome: Progressing   Problem: Skin Integrity: Goal: Risk for impaired skin integrity will decrease Outcome: Progressing   Problem: Education: Goal: Knowledge of disease or condition will improve Outcome: Progressing

## 2023-12-13 NOTE — TOC Progression Note (Addendum)
 Transition of Care [facility]) - Progression Note    Patient Details  Name: KODA ROUTON MRN: [id number] Date of Birth: Oct 09, 1940  Transition of Care [facility]) CM/SW Contact  Dalia GORMAN Fuse, RN Phone Number: 12/13/2023, 9:01 AM  Clinical Narrative:    TOC LVMM with Isaiah [phone] , covering [facility] requesting callback with confirmation bed is available for today.  Received a text message from [place] [facility]. The facility has a bed available today. However, due to the time of day their preference is for the patient to dc tomorrow.   TOC obtained PASRR [id number]   Expected Discharge Plan: Home w Home Health Services Barriers to Discharge: Continued Medical Work up               Expected Discharge Plan and Services   Discharge Planning Services: CM Consult Post Acute Care Choice: Home Health Living arrangements for the past 2 months: Single Family Home                           HH Arranged: PT, OT, RN [facility] Agency: [employer] [facility] Date [facility] Agency Contacted: 12/02/23 Time HH Agency Contacted: [phone] Representative spoke with at [facility] Agency: [place]   Social Drivers of Health (SDOH) Interventions SDOH Screenings   Food Insecurity: Patient Unable To Answer (11/29/2023)  Housing: Low Risk  (12/07/2023)  Transportation Needs: Unmet Transportation Needs (11/29/2023)  Utilities: Patient Unable To Answer (11/29/2023)  Financial Resource Strain: Low Risk  (11/11/2023)   Received from [facility]  Social Connections: Patient Unable To Answer (11/29/2023)  Tobacco Use: Low Risk  (12/07/2023)    Readmission Risk Interventions     No data to display

## 2023-12-13 NOTE — Progress Notes (Signed)
 Iv, infiltrated, provider info. Ok to be without

## 2023-12-13 NOTE — Evaluation (Signed)
 Occupational Therapy Re-Evaluation Patient Details Name: Victor Gould MRN: [id number] DOB: 04/18/1941 Today's Date: 12/13/2023   History of Present Illness   Pt is an 83 y.o. male presented from home to the ED on 11/28/2023 with confusion and using inappropriate words and actions throughout the day. 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 PMH significant for CAD s/p CABG, HTN, afib, CKDIII, Type II DM, hypothyroidism, h/o prostate cancer, HLD, HF, h/o CVA, subdural hematoma (3 years prior), parkinson's disease.     Clinical Impressions Pt seen for OT re-evaluation overlapping with PT for mobility (+2 for bed mobility only, treatment time for charges based on time after PT ended). Pt required visual and verbal cues for safety with RW while ambulating. Once back to the recliner, pt set up with optimal posture and positioning for self feeding. Pt required VC for initiation and hand over hand assist to hold spoon, scoop, and bring to his mouth to eat. Pt able to hold cup and bring to mouth to drink with intermittent set up assist as he attempted to reach for other items on the tray that were similar in color/shape to his cup. Ate approx 1/2 the pureed berries, 1/3 mashed potatoes with gravy, 1/2 of his magic cup, and about 2-3 cups of water . Spouse instructed in techniques and able to return demo with cues for tweaking the technique. Spouse very appreciative stating, This is an answer to prayer. Will continue to progress as able.      If plan is discharge home, recommend the following:   Direct supervision/assist for medications management;Supervision due to cognitive status;Direct supervision/assist for financial management;A lot of help with bathing/dressing/bathroom;A little help with walking and/or transfers;Assistance with cooking/housework;Assistance with feeding;Assist for transportation;Help with stairs or ramp for entrance      Functional Status Assessment         Equipment Recommendations    (defer)     Recommendations for Other Services         Precautions/Restrictions   Precautions Precautions: Fall Recall of Precautions/Restrictions: Impaired Restrictions Weight Bearing Restrictions Per Provider Order: No     Mobility Bed Mobility Overal bed mobility: Needs Assistance Bed Mobility: Supine to Sit     Supine to sit: Mod assist, +2 for safety/equipment          Transfers Overall transfer level: Needs assistance Equipment used: Rolling walker (2 wheels) Transfers: Sit to/from Stand Sit to Stand: Min assist, Contact guard assist           General transfer comment: initially minA from EOB. From recliner, able to complete sit to stand x 5 with CGA and cues for hand placement      Balance Overall balance assessment: Needs assistance Sitting-balance support: Bilateral upper extremity supported, Feet supported, No upper extremity supported Sitting balance-Leahy Scale: Fair     Standing balance support: Bilateral upper extremity supported, During functional activity, Reliant on assistive device for balance Standing balance-Leahy Scale: Poor                             ADL either performed or assessed with clinical judgement   ADL Overall ADL's : Needs assistance/impaired Eating/Feeding: Sitting;Maximal assistance;Cueing for sequencing Eating/Feeding Details (indicate cue type and reason): Pt required VC for initiation and hand over hand assist to hold spoon, scoop, and bring to his mouth to eat. Pt able to hold cup and bring  to mouth to drink with intermittent set up assist as he attempted to reach for other items on the tray that were similar in color/shape to his cup. Ate approx 1/2 the pureed berries, 1/3 mashed potatoes with gravy, 1/2 of his magic cup, and about 2-3 cups of water . Spouse instructed in techniques and able to return demo with cues for tweaking  the technique. Spouse very appreciative. Grooming: Sitting;Wash/dry face;Wash/dry hands;Set up;Supervision/safety;Cueing for sequencing                                       Vision         Perception         Praxis         Pertinent Vitals/Pain Pain Assessment Pain Assessment: No/denies pain     Extremity/Trunk Assessment Upper Extremity Assessment Upper Extremity Assessment: Generalized weakness;Right hand dominant;Difficult to assess due to impaired cognition   Lower Extremity Assessment Lower Extremity Assessment: Generalized weakness;Difficult to assess due to impaired cognition       Communication Communication Communication: Impaired Factors Affecting Communication: Hearing impaired;Difficulty expressing self;Reduced clarity of speech   Cognition Arousal: Alert Behavior During Therapy: WFL for tasks assessed/performed                                 Following commands: Impaired Following commands impaired: Follows one step commands inconsistently, Follows one step commands with increased time     Cueing  General Comments   Cueing Techniques: Verbal cues;Tactile cues;Gestural cues;Visual cues      Exercises     Shoulder Instructions      Home Living Family/patient expects to be discharged to:: Private residence Living Arrangements: Spouse/significant other Available Help at Discharge: Family;Available 24 hours/day Type of Home: House Home Access: Stairs to enter [employer] of Steps: 3 Entrance Stairs-Rails: Right;Left Home Layout: Laundry or work area in basement;Two level;Able to live on main level with bedroom/bathroom Alternate Level Stairs-Number of Steps: flight of steps to get to basement where laundry and work area are   [employer] Shower/Tub: Chief Strategy Officer: Standard Bathroom Accessibility: Yes   Home Equipment: Crutches;Cane - Programmer, applications (2 wheels);Rollator (4  wheels);Shower seat;BSC/3in1   Additional Comments: *told PT he had no DME*      Prior Functioning/Environment Prior Level of Function : Independent/Modified Independent;History of Falls (last six months)             Mobility Comments: IND without AD however endorses 1 fall in Jan/Feb with L wrist fx that is now healed ADLs Comments: IND with ADLs, mows the lawn riding mower and rides tractor    OT Problem List: Decreased cognition;Decreased strength;Impaired UE functional use;Impaired balance (sitting and/or standing);Decreased activity tolerance;Decreased safety awareness   OT Treatment/Interventions: Self-care/ADL training;Therapeutic exercise;Therapeutic activities;Cognitive remediation/compensation;Patient/family education;Balance training;Energy conservation;DME and/or AE instruction;Neuromuscular education;Visual/perceptual remediation/compensation      OT Goals(Current goals can be found in the care plan section)   Acute Rehab OT Goals Patient Stated Goal: improve function OT Goal Formulation: With patient/family Time For Goal Achievement: 12/27/23 Potential to Achieve Goals: Good   OT Frequency:  Min 2X/week    Co-evaluation              AM-PAC OT 6 Clicks Daily Activity     Outcome Measure Help from another person eating meals?: A Lot Help  from another person taking care of personal grooming?: A Little Help from another person toileting, which includes using toliet, bedpan, or urinal?: A Lot Help from another person bathing (including washing, rinsing, drying)?: A Lot Help from another person to put on and taking off regular upper body clothing?: A Lot Help from another person to put on and taking off regular lower body clothing?: A Lot 6 Click Score: 13   End of Session Equipment Utilized During Treatment: Rolling walker (2 wheels) Nurse Communication: Mobility status;Other (comment) (meal intake)  Activity Tolerance: Patient tolerated treatment  well Patient left: in chair;with call bell/phone within reach;with chair alarm set;with family/visitor present  OT Visit Diagnosis: Other abnormalities of gait and mobility (R26.89);Feeding difficulties (R63.3);Other symptoms and signs involving cognitive function                Time: 8493-8454 OT Time Calculation (min): 39 min Charges:  OT General Charges $OT Visit: 1 Visit OT Evaluation $OT Re-eval: 1 Re-eval OT Treatments $Self Care/Home Management : 23-37 mins  Warren SAUNDERS., MPH, MS, OTR/L ascom [phone] 12/13/23, 4:15 PM

## 2023-12-13 NOTE — Progress Notes (Signed)
 Progress Note   Patient: Victor Gould FMW:[id number] DOB: 03-Feb-1941 DOA: 11/28/2023     14 DOS: the patient was seen and examined on 12/13/2023   Brief hospital course: 83yo with h/o CAD s/p CABG, HTN, afib, stage 4 CKD, DM, hypothyroidism, prostate CA, HLD, CVA/SDH, and Parkinson's disease who presented on 7/31 with AMS.  MRI with small acute infarct in L insula and many small acute infarcts in L parietal loba as well as many remote infarcts and chronic R vertebral artery occlusion.  Hospital complicated by aspiration with thin liquids.  He will require SNF placement.  Assessment and Plan:  Acute CVA Patient presented with aphasia, AMS, no focal deficits MRI brain Small acute infarct in the left insula and many small acute infarcts in the overlying left parietal lobe (posterior left MCA territory) Neurology consulted, recommended to continue Eliquis  and Plavix  for CAD Carotid duplex negative for any significant stenosis TTE LVEF 50 to 55%, grade 1 diastolic dysfunction, no WMA, negative PFO. SLP eval done, patient is on regular diet, Requested re-eval as patient having aspiration with thin liquids - will have risk of aspiration due to Parkinson's. Encouraged to give pills in puree. PT and OT following, initially recommended home health but due to mental status changes patient will require SNF placement. He is medically stable and awaiting placement at this time, has been accepted for STR on 8/16   Moderate malnutrition Nutrition Problem: Moderate Malnutrition Etiology: chronic illness (Parkinson's, heart failure) Signs/Symptoms: mild fat depletion, moderate fat depletion, mild muscle depletion, moderate muscle depletion Interventions: Ensure Enlive (each supplement provides 350kcal and 20 grams of protein), MVI, Magic cup After prolonged family discussion, his wife wanted PEG tube placed IR consulted for PEG tube However, he started eating some better and so his wife has opted to  defer this for now and to continue to encourage PO intake   Sundowning and delirium with agitation Dementia with Parkinson's disease Patient was given a dose of Haldol  on 08/04, was very sleepy; avoid further Haldol  use due to Parkinson's disease Seen by Neurology on 08/08, no further imaging recommended Also stopped Seroquel  as he appears to be cognitively improving without sedating medications Continue Sinemet  and Aricept  Delirium precautions Palliative consult for GOC discussion - patient's wife and son (MD) are certain that he should remain full code and full scope of care at this time    CKD stage IV Baseline sCr appears to be 3.0 - 3.2 Encourage oral hydration, on IV fluids as patient has very poor po intake Bladder scan negative for retention   Metabolic acidosis Likely 2/2 starvation ketosis and CKD S/p Sodium bicarb amp Bicarb tabs 1300 TID, improving Check BMP daily for now   Hypocalcemia Will give IV calcium  gluconate 1 gram plus Calcitriol  0.5 microgram/day Will check ionized calcium  and continue to replete as needed   HTN- severely elevated, improving S/p permissive hypertension for 48 hours On Amlodipine  10mg , Bisoprolol  5mg , Inc Losartan  75 mg Hydralazine  IV vs po prn Monitor BP and titrate medications accordingly BP currently 150/72, as high as 171/72 His SBP goal is <160  Type II DM sSSI A1c 7.4 well-controlled   Parkinsons Continue home sinemet    Hypothyroidism Continue home synthroid    RUE swelling - improving US  RUE neg for DVT Elevation of RUE       Consultants: Neurology IR Palliative care PT OT SLP   Procedures: Carotid US  8/1 Echocardiogram 8/3 RUE DVT US  8/9   Antibiotics: None   30 Day Unplanned  Readmission Risk Score    Flowsheet Row ED to Hosp-Admission (Current) from 11/28/2023 in [facility] 1C MEDICAL TELEMETRY  30 Day Unplanned Readmission Risk Score (%) 26.2 Filed at 12/13/2023 0400    This  score is the patient's risk of an unplanned readmission within 30 days of being discharged (0 -100%). The score is based on dignosis, age, lab data, medications, orders, and past utilization.   Low:  0-14.9   Medium: 15-21.9   High: 22-29.9   Extreme: 30 and above           Subjective: Pleasant.  Did not eat well yesterday but is doing better today.  Wife is encouraged.   Objective: Vitals:   12/12/23 2358 12/13/23 0447  BP: (!) 160/75 (!) 170/79  Pulse: 71 80  Resp:  18  Temp: 98.3 F (36.8 C) 98.4 F (36.9 C)  SpO2: 92% 94%    Intake/Output Summary (Last 24 hours) at 12/13/2023 0738 Last data filed at 12/13/2023 0001 Gross per 24 hour  Intake --  Output 400 ml  Net -400 ml   Filed Weights   11/28/23 1836 12/11/23 1103  Weight: 72.6 kg 77 kg    Exam:  General:  Appears calm and comfortable and is in NAD Eyes:  normal lids, iris ENT:  grossly normal hearing, lips & tongue, mmm Cardiovascular:  RRR. No LE edema.  Respiratory:   CTA bilaterally with no wheezes/rales/rhonchi.  Normal respiratory effort. Abdomen:  soft, NT, ND Skin:  no rash or induration seen on limited exam Musculoskeletal:  grossly normal tone BUE/BLE, good ROM, no bony abnormality Psychiatric:  confused mood and affect, speech with expressive aphasia Neurologic:  CN 2-12 grossly intact, moves all extremities in coordinated fashion  Data Reviewed: I have reviewed the patient's lab results since admission.  Pertinent labs for today include:   Na++ 146 CO2 20 Glucose 196 BUN 24/Creatinine 2.86/GFR 21 Ca++ 7.2, improving Stable CBC     Family Communication: Wife was present throughout and enjoys conversation  Disposition: Status is: Inpatient Remains inpatient appropriate because: awaiting placement on 8/16     Time spent: 50 minutes  Unresulted Labs (From admission, onward)    None        Author: Delon Herald, MD 12/13/2023 7:38 AM  For on call review [email or web address].

## 2023-12-13 NOTE — Progress Notes (Signed)
 Physical Therapy Treatment Patient Details Name: Victor Gould MRN: [id number] DOB: 05/08/1940 Today's Date: 12/13/2023   History of Present Illness Pt is an 83 y.o. male presented from home to the ED on 11/28/2023 with confusion and using inappropriate words and actions throughout the day. 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 PMH significant for CAD s/p CABG, HTN, afib, CKDIII, Type II DM, hypothyroidism, h/o prostate cancer, HLD, HF, h/o CVA, subdural hematoma (3 years prior), parkinson's disease.    PT Comments  Patient more alert this PM on PT arrival and agreeable to PT session. Wife present during session and encouraging. Completed bed mobility with modA +2. Stood from EOB with minA and RW. Impulsive during ambulation and able to complete ~70' with RW and minA for RW management around furniture. Able to complete sit to stand x 5 from recliner with CGA and cues for hand placement. Discharge plan remains appropriate.    If plan is discharge home, recommend the following: A lot of help with walking and/or transfers;A lot of help with bathing/dressing/bathroom;Assistance with cooking/housework;Assistance with feeding;Direct supervision/assist for medications management;Direct supervision/assist for financial management;Assist for transportation;Help with stairs or ramp for entrance;Supervision due to cognitive status   Can travel by private vehicle     No  Equipment Recommendations  None recommended by PT    Recommendations for Other Services       Precautions / Restrictions Precautions Precautions: Fall Recall of Precautions/Restrictions: Impaired Restrictions Weight Bearing Restrictions Per Provider Order: No     Mobility  Bed Mobility Overal bed mobility: Needs Assistance Bed Mobility: Supine to Sit     Supine to sit: Mod assist, +2 for safety/equipment          Transfers Overall transfer level: Needs  assistance Equipment used: Rolling Chelan Heringer (2 wheels) Transfers: Sit to/from Stand Sit to Stand: Min assist, Contact guard assist           General transfer comment: initially minA from EOB. From recliner, able to complete sit to stand x 5 with CGA and cues for hand placement    Ambulation/Gait Ambulation/Gait assistance: Min assist Gait Distance (Feet): 70 Feet Assistive device: Rolling Raychel Dowler (2 wheels) Gait Pattern/deviations: Shuffle, Trunk flexed, Narrow base of support   Gait velocity interpretation: >2.62 ft/sec, indicative of community ambulatory   General Gait Details: assist for RW management around furniture. cues for slower pace to minimize unsteadiness   Stairs             Wheelchair Mobility     Tilt Bed    Modified Rankin (Stroke Patients Only)       Balance Overall balance assessment: Needs assistance Sitting-balance support: Bilateral upper extremity supported, Feet supported Sitting balance-Leahy Scale: Fair     Standing balance support: Bilateral upper extremity supported, During functional activity, Reliant on assistive device for balance Standing balance-Leahy Scale: Poor                              Communication Communication Communication: Impaired Factors Affecting Communication: Hearing impaired;Difficulty expressing self;Reduced clarity of speech  Cognition Arousal: Alert Behavior During Therapy: WFL for tasks assessed/performed   PT - Cognitive impairments: History of cognitive impairments                         Following commands: Impaired Following commands impaired: Follows one step commands inconsistently  Cueing    Exercises Other Exercises Other Exercises: sit to stand x 5 with CGA and cues for hand placement    General Comments        Pertinent Vitals/Pain Pain Assessment Pain Assessment: No/denies pain    Home Living                          Prior Function             PT Goals (current goals can now be found in the care plan section) Acute Rehab PT Goals Patient Stated Goal: to go home PT Goal Formulation: With patient Time For Goal Achievement: 12/27/23 Potential to Achieve Goals: Fair Progress towards PT goals: Progressing toward goals    Frequency    Min 2X/week      PT Plan      Co-evaluation              AM-PAC PT 6 Clicks Mobility   Outcome Measure  Help needed turning from your back to your side while in a flat bed without using bedrails?: A Lot Help needed moving from lying on your back to sitting on the side of a flat bed without using bedrails?: A Lot Help needed moving to and from a bed to a chair (including a wheelchair)?: A Lot Help needed standing up from a chair using your arms (e.g., wheelchair or bedside chair)?: A Lot Help needed to walk in hospital room?: A Lot Help needed climbing 3-5 steps with a railing? : Total 6 Click Score: 11    End of Session   Activity Tolerance: Patient tolerated treatment well Patient left: in chair;with call bell/phone within reach;with chair alarm set;with family/visitor present Nurse Communication: Mobility status PT Visit Diagnosis: Unsteadiness on feet (R26.81);History of falling (Z91.81)     Time: 8493-8479 PT Time Calculation (min) (ACUTE ONLY): 14 min  Charges:    $Therapeutic Activity: 8-22 mins PT General Charges $$ ACUTE PT VISIT: 1 Visit                     Maryanne Finder, PT, DPT Physical Therapist - [facility]  [facility]    Jose Corvin A Kashlynn Kundert 12/13/2023, 3:52 PM

## 2023-12-13 NOTE — Progress Notes (Signed)
 SLP Cancellation Note  Patient Details Name: Victor Gould MRN: [id number] DOB: March 04, 1941   Cancelled treatment:       Reason Eval/Treat Not Completed:  (chart reviewed)  Per chart notes, including Dietician's note, pt has Moderate Malnutrition and is not taking po consistently- a Calorie Count indicated 0% at 2 meals in recent day.  Pt has Chronic illness and disease process to include Parkinson's Disease and Cognitive decline/Dementia, and acute/chronic infarcts per MRI. Per Palliative Care note(following for GOC w/ pt/Wife), Wife desires Full scope of care/Full Code and desires PEG placement fo rpt; she is hopeful that pt will get stronger.   Recommend continue current dysphagia diet- level 2 w/ thin liquids w/ general aspiration precautions; carefully monitor drinking and pt should help to Hold Cup when drinking. NO Straws. Recommend reduce distractions, sitting Fully Upright for all oral intake, Small bites/sips Slowly. Support and Supervision w/ feeding- Encouragement for pt to feed self and to take some po's at meals/snacks. GERD precautions. Pills WHOLE vs CRUSHED in Puree for safer, easier swallowing -- it is encouraged now and for D/C to the Wife, pt. Ongoing f/u w/ Palliative Care, Dietician for support.  The above has been discussed w/ Wife at evaluation. She agreed. No further Acute skilled ST services indicated at this time. F/u at a next venue of care can be had if needs indicate.     Comer Portugal, MS, CCC-SLP Speech Language Pathologist Rehab Services; [facility] [phone] (ascom) Dariusz Brase 12/13/2023, 9:38 AM

## 2023-12-14 DIAGNOSIS — I639 Cerebral infarction, unspecified: Secondary | ICD-10-CM | POA: Diagnosis not present

## 2023-12-14 LAB — GLUCOSE, CAPILLARY
Glucose-Capillary: 163 mg/dL — ABNORMAL HIGH (ref 70–99)
Glucose-Capillary: 201 mg/dL — ABNORMAL HIGH (ref 70–99)

## 2023-12-14 MED ORDER — ZINC OXIDE 40 % EX OINT: TOPICAL_OINTMENT | CUTANEOUS | Status: AC | PRN

## 2023-12-14 MED ORDER — SODIUM BICARBONATE 650 MG PO TABS: 1300.0000 mg | ORAL_TABLET | Freq: Three times a day (TID) | ORAL | Status: AC

## 2023-12-14 MED ORDER — BISACODYL 5 MG PO TBEC: 5.0000 mg | DELAYED_RELEASE_TABLET | Freq: Every day | ORAL | Status: AC | PRN

## 2023-12-14 MED ORDER — INSULIN ASPART 100 UNIT/ML IJ SOLN: 0.0000 [IU] | Freq: Three times a day (TID) | INTRAMUSCULAR | Status: AC

## 2023-12-14 MED ORDER — ATORVASTATIN CALCIUM 40 MG PO TABS: 40.0000 mg | ORAL_TABLET | Freq: Every day | ORAL | Status: AC

## 2023-12-14 MED ORDER — AMLODIPINE BESYLATE 10 MG PO TABS
10.0000 mg | ORAL_TABLET | Freq: Every day | ORAL | Status: AC
Start: 2023-12-15 — End: ?

## 2023-12-14 MED ORDER — BISOPROLOL FUMARATE 5 MG PO TABS
5.0000 mg | ORAL_TABLET | Freq: Every day | ORAL | Status: AC
Start: 2023-12-15 — End: ?

## 2023-12-14 MED ORDER — ENSURE PLUS HIGH PROTEIN PO LIQD: 237.0000 mL | Freq: Three times a day (TID) | ORAL | Status: AC

## 2023-12-14 MED ORDER — SACCHAROMYCES BOULARDII 250 MG PO CAPS: 250.0000 mg | ORAL_CAPSULE | Freq: Two times a day (BID) | ORAL | Status: AC

## 2023-12-14 MED ORDER — LOSARTAN POTASSIUM 25 MG PO TABS: 75.0000 mg | ORAL_TABLET | Freq: Every day | ORAL | Status: AC

## 2023-12-14 MED ORDER — HYDRALAZINE HCL 50 MG PO TABS: 50.0000 mg | ORAL_TABLET | Freq: Four times a day (QID) | ORAL | Status: AC | PRN

## 2023-12-14 MED ORDER — CALCITRIOL 0.5 MCG PO CAPS: 0.5000 ug | ORAL_CAPSULE | Freq: Every day | ORAL | Status: AC

## 2023-12-14 NOTE — Plan of Care (Signed)
  Problem: Education: Goal: Ability to describe self-care measures that may prevent or decrease complications (Diabetes Survival Skills Education) will improve Outcome: Progressing Goal: Individualized Educational Video(s) Outcome: Progressing   Problem: Coping: Goal: Ability to adjust to condition or change in health will improve Outcome: Progressing   Problem: Fluid Volume: Goal: Ability to maintain a balanced intake and output will improve Outcome: Progressing   Problem: Health Behavior/Discharge Planning: Goal: Ability to identify and utilize available resources and services will improve Outcome: Progressing Goal: Ability to manage health-related needs will improve Outcome: Progressing   Problem: Metabolic: Goal: Ability to maintain appropriate glucose levels will improve Outcome: Progressing   Problem: Nutritional: Goal: Maintenance of adequate nutrition will improve Outcome: Progressing Goal: Progress toward achieving an optimal weight will improve Outcome: Progressing   Problem: Skin Integrity: Goal: Risk for impaired skin integrity will decrease Outcome: Progressing   Problem: Tissue Perfusion: Goal: Adequacy of tissue perfusion will improve Outcome: Progressing   Problem: Education: Goal: Knowledge of General Education information will improve Description: Including pain rating scale, medication(s)/side effects and non-pharmacologic comfort measures Outcome: Progressing   Problem: Health Behavior/Discharge Planning: Goal: Ability to manage health-related needs will improve Outcome: Progressing   Problem: Clinical Measurements: Goal: Ability to maintain clinical measurements within normal limits will improve Outcome: Progressing Goal: Will remain free from infection Outcome: Progressing Goal: Diagnostic test results will improve Outcome: Progressing Goal: Respiratory complications will improve Outcome: Progressing Goal: Cardiovascular complication will  be avoided Outcome: Progressing   Problem: Activity: Goal: Risk for activity intolerance will decrease Outcome: Progressing   Problem: Nutrition: Goal: Adequate nutrition will be maintained Outcome: Progressing   Problem: Coping: Goal: Level of anxiety will decrease Outcome: Progressing   Problem: Elimination: Goal: Will not experience complications related to bowel motility Outcome: Progressing Goal: Will not experience complications related to urinary retention Outcome: Progressing   Problem: Pain Managment: Goal: General experience of comfort will improve and/or be controlled Outcome: Progressing   Problem: Safety: Goal: Ability to remain free from injury will improve Outcome: Progressing   Problem: Skin Integrity: Goal: Risk for impaired skin integrity will decrease Outcome: Progressing   Problem: Education: Goal: Knowledge of disease or condition will improve Outcome: Progressing Goal: Knowledge of secondary prevention will improve (MUST DOCUMENT ALL) Outcome: Progressing Goal: Knowledge of patient specific risk factors will improve (DELETE if not current risk factor) Outcome: Progressing

## 2023-12-14 NOTE — TOC Transition Note (Signed)
 Transition of Care [facility]) - Discharge Note   Patient Details  Name: Victor Gould MRN: [id number] Date of Birth: 1940/11/30  [facility] CM/SW Contact:  [place] L Chelcie Estorga, LCSW Phone Number: 12/14/2023, 12:14 PM   Clinical Narrative:     CSW scheduled transportation with [facility] to [facility]. Spouse was notified of transport. Med Necessity form was completed and faxed to the floor. Patient will go to room 110-A. Number to call report was provided to RN.   TOC signing off.      Barriers to Discharge: No Barriers Identified   Patient Goals and CMS Choice     Choice offered to / list presented to : Patient [place] ownership interest in [facility].provided to:: Patient    Discharge Placement                Patient to be transferred to facility by: [facility] Name of family member notified: Calloway Andrus [phone] Patient and family notified of of transfer: 12/14/23  Discharge Plan and Services Additional resources added to the After Visit Summary for     Discharge Planning Services: CM Consult Post Acute Care Choice: Home Health                    HH Arranged: PT, OT, RN [facility] Agency: [facility] Date [facility] Agency Contacted: 12/02/23 Time HH Agency Contacted: [id number] Representative spoke with at [facility] Agency: [place]  Social Drivers of Health (SDOH) Interventions SDOH Screenings   Food Insecurity: Patient Unable To Answer (11/29/2023)  Housing: Low Risk  (12/07/2023)  Transportation Needs: Unmet Transportation Needs (11/29/2023)  Utilities: Patient Unable To Answer (11/29/2023)  Financial Resource Strain: Low Risk  (11/11/2023)   Received from [facility]  Social Connections: Patient Unable To Answer (11/29/2023)  Tobacco Use: Low Risk  (12/07/2023)     Readmission Risk Interventions     No data to display

## 2023-12-14 NOTE — Discharge Summary (Signed)
 Physician Discharge Summary   Patient: Victor Gould MRN: [id number] DOB: [birth year]  Admit date:     11/28/2023  Discharge date: 12/14/23  Discharge Physician: Delon Herald   PCP: Cleotilde Oneil FALCON, MD   Recommendations at discharge:   You are being discharged to [facility] Continue to discuss code status and goals of care, encourage outpatient palliative care consultation A feeding tube may eventually be needed (if desired by family) but for now continue to encourage PO intake with dysphagia 2 diet Follow up with Dr. Cleotilde after release from rehab  Discharge Diagnoses: Principal Problem:   Acute CVA (cerebrovascular accident) [facility]) Active Problems:   Malnutrition of moderate degree    Hospital Course: 83yo with h/o CAD s/p CABG, HTN, afib, stage 4 CKD, DM, hypothyroidism, prostate CA, HLD, CVA/SDH, and Parkinson's disease who presented on 7/31 with AMS.  MRI with small acute infarct in L insula and many small acute infarcts in L parietal loba as well as many remote infarcts and chronic R vertebral artery occlusion.  Hospital complicated by aspiration with thin liquids.  He will require SNF placement.  Assessment and Plan:  Acute CVA Patient presented with aphasia, AMS, no focal deficits MRI brain Small acute infarct in the left insula and many small acute infarcts in the overlying left parietal lobe (posterior left MCA territory) Neurology consulted, recommended to continue Eliquis  and Plavix  for CAD Carotid duplex negative for any significant stenosis TTE LVEF 50 to 55%, grade 1 diastolic dysfunction, no WMA, negative PFO. SLP eval done, patient is on regular diet, Requested re-eval as patient having aspiration with thin liquids - will have risk of aspiration due to Parkinson's. Encouraged to give pills in puree. PT and OT following, initially recommended home health but due to mental status changes patient will require SNF placement. He is medically  stable and awaiting placement at this time, has been accepted for STR on 8/16   Moderate malnutrition Nutrition Problem: Moderate Malnutrition Etiology: chronic illness (Parkinson's, heart failure) Signs/Symptoms: mild fat depletion, moderate fat depletion, mild muscle depletion, moderate muscle depletion Interventions: Ensure Enlive (each supplement provides 350kcal and 20 grams of protein), MVI, Magic cup After prolonged family discussion, his wife wanted PEG tube placed IR consulted for PEG tube However, he started eating some better and so his wife has opted to defer this for now and to continue to encourage PO intake   Sundowning and delirium with agitation Dementia with Parkinson's disease Patient was given a dose of Haldol  on 08/04, was very sleepy; avoid further Haldol  use due to Parkinson's disease Seen by Neurology on 08/08, no further imaging recommended Also stopped Seroquel  as he appears to be cognitively improving without sedating medications Continue Sinemet  and Aricept  Delirium precautions Palliative consult for GOC discussion - patient's wife and son (MD) are certain that he should remain full code and full scope of care at this time    CKD stage IV Baseline sCr appears to be 3.0 - 3.2 Encourage oral hydration, on IV fluids as patient has very poor po intake Bladder scan negative for retention   Metabolic acidosis Likely 2/2 starvation ketosis and CKD S/p Sodium bicarb amp Bicarb tabs 1300 TID, improving   Hypocalcemia Improving after IV calcium  gluconate 1 gram plus Calcitriol  0.5 microgram/day   HTN- severely elevated, improving S/p permissive hypertension for 48 hours On Amlodipine  10mg , Bisoprolol  5mg , Inc Losartan  75 mg Monitor BP and titrate medications accordingly BP currently 149/87-179/79 His SBP goal is <160  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 and patient was instructed, not to drive, operate heavy machinery, perform activities at heights, swimming or participation in water  activities or provide baby-sitting services while on Pain, Sleep and Anxiety Medications; until their outpatient Physician has advised to do so again. Also recommended to not to take more than prescribed Pain, Sleep and Anxiety Medications.   Disposition: Skilled nursing facility Diet recommendation:  Dysphagia type 2 Thin Liquid DISCHARGE MEDICATION: Allergies as of 12/14/2023       Reactions   Naproxen Sodium Shortness Of Breath   Other Hives   Oxycodone  Hcl Nausea And Vomiting   Sulfa Antibiotics Rash, Hives   Trazodone Other (See Comments)   Hallucinations   Haldol  [haloperidol  Lactate] Other (See Comments)   Contraindicated due to Parkinson disease   Tramadol Other (See Comments)   dizziness   Carvedilol  Rash        Medication List     STOP taking these medications    insulin  lispro 100 UNIT/ML KiwkPen Commonly known as: HUMALOG       TAKE these medications    acetaminophen  500 MG tablet Commonly known as: TYLENOL  Take 500-1,000 mg by mouth daily as needed for mild pain or headache (pain).   amLODipine  10 MG tablet Commonly known as: NORVASC  Take 1 tablet (10 mg total) by mouth daily. Start taking on: December 15, 2023 What changed: how much to take   atorvastatin  40 MG tablet Commonly known as: LIPITOR  Take 1 tablet (40 mg total) by mouth daily. Start taking on: December 15, 2023   b complex vitamins tablet Take 1 tablet by mouth daily after breakfast.   bisacodyl  5 MG EC tablet Commonly known as: DULCOLAX Take 1 tablet (5 mg  total) by mouth daily as needed for moderate constipation.   bisoprolol  5 MG tablet Commonly known as: ZEBETA  Take 1 tablet (5 mg total) by mouth daily. Start taking on: December 15, 2023 What changed: how much to take   calcitRIOL  0.5 MCG capsule Commonly known as: ROCALTROL  Take 1 capsule (0.5 mcg total) by mouth daily. Start taking on: December 15, 2023   carbidopa -levodopa  25-100 MG tablet Commonly known as: SINEMET  IR Take 1 tablet by mouth 3 (three) times daily.   cholecalciferol  1000 units tablet Commonly known as: VITAMIN D  Take 1,000 Units by mouth daily after breakfast.   clopidogrel  75 MG tablet Commonly known as: PLAVIX  Take 75 mg by mouth daily.   donepezil  5 MG tablet Commonly known as: ARICEPT  Take 5 mg by mouth at bedtime.   Eliquis  2.5 MG Tabs tablet Generic drug: apixaban  Take 2.5 mg by mouth 2 (two) times daily.   feeding supplement Liqd Take 237 mLs by mouth 3 (three) times daily between meals.   hydrALAZINE  50 MG tablet Commonly known as: APRESOLINE  Take 1 tablet (50 mg total) by mouth every 6 (six) hours as needed (SBP >150).   insulin  aspart 100 UNIT/ML injection Commonly known as: novoLOG  Inject 0-9 Units into the skin 3 (three) times daily with meals.   insulin  glargine 100 unit/mL Sopn  Commonly known as: LANTUS  Inject 5-10 Units into the skin See admin instructions. Inject 10 units 10 minutes before breakfast, and5 units before bed   levothyroxine  75 MCG tablet Commonly known as: SYNTHROID  Take 75 mcg by mouth daily before breakfast.   liver oil-zinc  oxide 40 % ointment Commonly known as: DESITIN Apply topically as needed for irritation.   losartan  25 MG tablet Commonly known as: COZAAR  Take 3 tablets (75 mg total) by mouth daily. Start taking on: December 15, 2023   multivitamin with minerals Tabs tablet Take 1 tablet by mouth daily after breakfast.   omeprazole 20 MG capsule Commonly known as: PRILOSEC Take 20 mg by mouth daily  before breakfast.   saccharomyces boulardii 250 MG capsule Commonly known as: FLORASTOR Take 1 capsule (250 mg total) by mouth 2 (two) times daily.   sodium bicarbonate  650 MG tablet Take 2 tablets (1,300 mg total) by mouth 3 (three) times daily.   SYSTANE FREE OP Place 1 drop into both eyes daily as needed (dry eyes). Preservative-free   triamcinolone cream 0.5 % Commonly known as: KENALOG Apply 1 Application topically 2 (two) times daily as needed (itching).        Contact information for follow-up providers     Cleotilde Oneil FALCON, MD Follow up.   Specialty: Internal Medicine Why: hospital follow up Contact information: [facility] [address] [place] [address] [phone]              Contact information for after-discharge care     Destination     [facility] .   Service: Skilled Nursing Contact information: [address] [place]  [phone]                    Discharge Exam:   Subjective: Sleeping this AM, no specific complaints   Objective: Vitals:   12/14/23 0700 12/14/23 0800  BP: (!) 149/87 (!) 179/79  Pulse: 72 70  Resp:  16  Temp:    SpO2:  95%   No intake or output data in the 24 hours ending 12/14/23 1040 Filed Weights   11/28/23 1836 12/11/23 1103  Weight: 72.6 kg 77 kg    Exam:  General:  Appears calm and comfortable and is in NAD Eyes:  normal lids, iris ENT:  grossly normal hearing, lips & tongue, mmm Cardiovascular:  RRR. No LE edema.  Respiratory:   CTA bilaterally with no wheezes/rales/rhonchi.  Normal respiratory effort. Abdomen:  soft, NT, ND Skin:  no rash or induration seen on limited exam Musculoskeletal:  grossly normal tone BUE/BLE, good ROM, no bony abnormality Psychiatric:  confused mood and affect, speech with expressive aphasia Neurologic:  CN 2-12 grossly intact, moves all extremities in coordinated fashion  Data  Reviewed: I have reviewed the patient's lab results since admission.  Pertinent labs for today include:   None today    Condition at discharge: stable  The results of significant diagnostics from this hospitalization (including imaging, microbiology, ancillary and laboratory) are listed below for reference.   Imaging Studies: US  Venous Img Upper Uni Right(DVT) Result Date: 12/08/2023 CLINICAL DATA:  Swelling. EXAM: RIGHT UPPER EXTREMITY VENOUS DOPPLER ULTRASOUND TECHNIQUE: Gray-scale sonography with graded compression, as well as color Doppler and duplex ultrasound were performed to evaluate the upper extremity deep venous system from the level of the subclavian vein and including the jugular, axillary, basilic, radial, ulnar and upper cephalic vein. Spectral Doppler was utilized to evaluate  flow at rest and with distal augmentation maneuvers. COMPARISON:  None Available. FINDINGS: Contralateral Subclavian Vein: No evidence of thrombus. Normal color Doppler flow and phasicity. Internal Jugular Vein: No evidence of thrombus. Normal compressibility, color Doppler flow and phasicity. Subclavian Vein: No evidence of thrombus. Normal color Doppler flow and phasicity. Axillary Vein: No evidence of thrombus. Normal compressibility, respiratory phasicity and response to augmentation. Cephalic Vein: No evidence of thrombus. Normal compressibility, respiratory phasicity and response to augmentation. Basilic Vein: No evidence of thrombus. Normal compressibility, respiratory phasicity and response to augmentation. Brachial Veins: No evidence of thrombus. Normal compressibility, respiratory phasicity and response to augmentation. Radial Veins: No evidence of thrombus. Normal compressibility and color Doppler flow. Ulnar Veins: No evidence of thrombus. Normal compressibility and color Doppler flow. Other Findings:  None visualized. IMPRESSION: No evidence of DVT within the right upper extremity. Electronically Signed    By: Juliene Balder M.D.   On: 12/08/2023 10:26   DG Chest 1 View Result Date: 12/07/2023 CLINICAL DATA:  Pneumonia. EXAM: CHEST  1 VIEW COMPARISON:  11/28/2023 FINDINGS: Prior median sternotomy. Stable heart size and mediastinal contours, retrocardiac hiatal hernia. Mild patchy opacity at the left lung base, new from prior exam. No pulmonary edema. No significant pleural effusion. No pneumothorax. IMPRESSION: 1. Mild patchy opacity at the left lung base, new from prior exam, suspicious for pneumonia. 2. Retrocardiac hiatal hernia. Electronically Signed   By: Andrea Gasman M.D.   On: 12/07/2023 14:27   DG Abd 1 View Result Date: 12/05/2023 EXAM: 1 VIEW XRAY OF THE ABDOMEN 12/05/2023 02:27:07 PM COMPARISON: None available. CLINICAL HISTORY: 448619 Bowel dysfunction 448619. Bowel dysfunction FINDINGS: BOWEL: Nonobstructive bowel gas pattern. SOFT TISSUES: There are a couple of surgical clips in the left upper quadrant. There are a couple of phleboliths present laterally in the left pelvis. BONES: No acute osseous abnormality. IMPRESSION: 1. No acute findings. Electronically signed by: evalene coho 12/05/2023 02:45 PM EDT RP Workstation: [id number]   US  RENAL Result Date: 12/02/2023 CLINICAL DATA:  Acute renal insufficiency. EXAM: RENAL / URINARY TRACT ULTRASOUND COMPLETE COMPARISON:  06/23/2020 FINDINGS: Right Kidney: Renal measurements: 8.9 x 4.4 x 5.3 cm = volume: 109 mL. Slight increased parenchymal echogenicity. Normal cortical thickness. No mass or hydronephrosis visualized. Left Kidney: Renal measurements: 8.6 x 4.5 x 4.3 cm = volume: 87 mL. Slight increased parenchymal echogenicity with normal cortical thickness. No mass or hydronephrosis visualized. Bladder: Appears normal for degree of bladder distention. Other: None. IMPRESSION: Normal size kidneys with findings which can be seen with medical renal disease. No hydronephrosis. Electronically Signed   By: Toribio Agreste M.D.   On: 12/02/2023 11:52    ECHOCARDIOGRAM COMPLETE Result Date: 12/01/2023    ECHOCARDIOGRAM REPORT   Patient Name:   DEONDRE MARINARO [facility] Date of Exam: 12/01/2023 Medical Rec #:  [id number]         Height:       69.0 in Accession #:    [id number]        Weight:       160.1 lb Date of Birth:  1940-11-14          BSA:          1.879 m Patient Age:    83 years          BP:           180/90 mmHg Patient Gender: M                 HR:  71 bpm. Exam Location:  [facility] Procedure: 2D Echo, Cardiac Doppler, Color Doppler and Intracardiac            Opacification Agent (Both Spectral and Color Flow Doppler were            utilized during procedure). Indications:     Stroke I63.9  History:         Patient has no prior history of Echocardiogram examinations.  Sonographer:     Thedora Louder RDCS, FASE Referring Phys:  [id number] MARIEN LITTIE PIETY Diagnosing Phys: Cara JONETTA Lovelace MD  Sonographer Comments: Technically difficult study due to poor echo windows, suboptimal apical window and no subcostal window. Image acquisition challenging due to patient body habitus. IMPRESSIONS  1. Left ventricular ejection fraction, by estimation, is 50 to 55%. The left ventricle has low normal function. The left ventricle has no regional wall motion abnormalities. Left ventricular diastolic parameters are consistent with Grade I diastolic dysfunction (impaired relaxation).  2. Right ventricular systolic function is normal. The right ventricular size is normal.  3. The mitral valve is normal in structure. Trivial mitral valve regurgitation.  4. The aortic valve is normal in structure. Aortic valve regurgitation is not visualized. FINDINGS  Left Ventricle: Left ventricular ejection fraction, by estimation, is 50 to 55%. The left ventricle has low normal function. The left ventricle has no regional wall motion abnormalities. Strain was performed and the global longitudinal strain is indeterminate. The left ventricular internal cavity size was normal in size. There is  borderline left ventricular hypertrophy. Left ventricular diastolic parameters are consistent with Grade I diastolic dysfunction (impaired relaxation). Right Ventricle: The right ventricular size is normal. No increase in right ventricular wall thickness. Right ventricular systolic function is normal. Left Atrium: Left atrial size was normal in size. Right Atrium: Right atrial size was normal in size. Pericardium: There is no evidence of pericardial effusion. Mitral Valve: The mitral valve is normal in structure. Trivial mitral valve regurgitation. Tricuspid Valve: The tricuspid valve is normal in structure. Tricuspid valve regurgitation is trivial. Aortic Valve: The aortic valve is normal in structure. Aortic valve regurgitation is not visualized. Aortic valve peak gradient measures 7.1 mmHg. Pulmonic Valve: The pulmonic valve was normal in structure. Pulmonic valve regurgitation is not visualized. Aorta: The ascending aorta was not well visualized. IAS/Shunts: No atrial level shunt detected by color flow Doppler. Additional Comments: 3D was performed not requiring image post processing on an independent workstation and was indeterminate.  LEFT VENTRICLE PLAX 2D LVIDd:         5.00 cm     Diastology LVIDs:         3.60 cm     LV e' medial:    6.09 cm/s LV PW:         1.00 cm     LV E/e' medial:  9.0 LV IVS:        1.15 cm     LV e' lateral:   8.59 cm/s LVOT diam:     2.00 cm     LV E/e' lateral: 6.4 LV SV:         57 LV SV Index:   30 LVOT Area:     3.14 cm  LV Volumes (MOD) LV vol d, MOD A4C: 90.5 ml LV vol s, MOD A4C: 39.4 ml LV SV MOD A4C:     90.5 ml RIGHT VENTRICLE RV Basal diam:  3.00 cm RV S prime:     9.79 cm/s TAPSE (M-mode): 1.4 cm  LEFT ATRIUM           Index        RIGHT ATRIUM           Index LA diam:      4.00 cm 2.13 cm/m   RA Area:     10.90 cm LA Vol (A2C): 23.3 ml 12.40 ml/m  RA Volume:   22.70 ml  12.08 ml/m LA Vol (A4C): 66.8 ml 35.54 ml/m  AORTIC VALVE AV Area (Vmax): 2.09 cm AV Vmax:         133.00 cm/s AV Peak Grad:   7.1 mmHg LVOT Vmax:      88.60 cm/s LVOT Vmean:     58.200 cm/s LVOT VTI:       0.181 m  AORTA Ao Root diam: 3.60 cm MITRAL VALVE                TRICUSPID VALVE MV Area (PHT): 3.01 cm     TR Peak grad:   24.0 mmHg MV Decel Time: 252 msec     TR Vmax:        245.00 cm/s MV E velocity: 55.00 cm/s MV A velocity: 101.00 cm/s  SHUNTS MV E/A ratio:  0.54         Systemic VTI:  0.18 m                             Systemic Diam: 2.00 cm Cara JONETTA Lovelace MD Electronically signed by Cara JONETTA Lovelace MD Signature Date/Time: 12/01/2023/3:49:11 PM    Final    US  Carotid Bilateral Result Date: 11/29/2023 CLINICAL DATA:  Stroke EXAM: BILATERAL CAROTID DUPLEX ULTRASOUND TECHNIQUE: Elnor scale imaging, color Doppler and duplex ultrasound were performed of bilateral carotid and vertebral arteries in the neck. COMPARISON:  None Available. FINDINGS: Criteria: Quantification of carotid stenosis is based on velocity parameters that correlate the residual internal carotid diameter with NASCET-based stenosis levels, using the diameter of the distal internal carotid lumen as the denominator for stenosis measurement. The following velocity measurements were obtained: RIGHT ICA: 113 cm/sec CCA: 85.7 cm/sec SYSTOLIC ICA/CCA RATIO:  1.3 ECA: 105 cm/sec LEFT ICA: 81 cm/sec CCA: 91.8 cm/sec SYSTOLIC ICA/CCA RATIO:  0.9 ECA: 171 cm/sec RIGHT CAROTID ARTERY: There is some partially calcified plaque particularly along the carotid bulb and proximal ICA region. Antegrade flow along the internal external carotid artery. RIGHT VERTEBRAL ARTERY:  Antegrade flow LEFT CAROTID ARTERY: There is some mild partially calcified plaque along the distal common carotid artery. Antegrade flow along the ICA and ECA. LEFT VERTEBRAL ARTERY:  Antegrade flow IMPRESSION: Scattered bilateral partially calcified plaque, more right than left. No hemodynamic significant stenosis of either ICA. Electronically Signed   By: Ranell Bring M.D.   On:  11/29/2023 16:48   MR ANGIO HEAD WO CONTRAST Result Date: 11/29/2023 EXAM: MR Angiography Head without intravenous Contrast. 11/29/2023 02:18:31 PM TECHNIQUE: Magnetic resonance angiography images of the head without intravenous contrast. Multiplanar 2D and 3D reformatted images are provided for review. COMPARISON: MRI of the head dated 11/28/2023. CLINICAL HISTORY: Neuro deficit, acute, stroke suspected. FINDINGS: ANTERIOR CIRCULATION: Near occlusive stenosis of the right pericholecystal artery is seen on image 136 of series 5. Multifocal near occlusive stenosis is also present within the left pericholecystal artery, evident on images 145 and 154. Mild-to-moderate irregular stenosis of the M1 branches of the middle cerebral arteries bilaterally. Near occlusive stenosis of sylvian M2 branch of the left middle cerebral  artery is seen on image 85. Near occlusive stenosis of an approcular m3 branch of the right middle cerebral artery is seen on image 118. POSTERIOR CIRCULATION: Moderate stenosis of the distal basilar artery. The bun segments of the posterior cerebral arteries are diminutive or moderately stenotic. Severe stenosis of the distal p1 segment of the right posterior cerebral artery. Near occlusive stenosis of the distal right posterior communicating artery. The right p2 branch is either occluded or markedly diminutive. The right C3 segments are not demonstrated. The um superior cerebellar arteries are patent, but diminutive. The right anterior inferior cerebellar artery demonstrates moderate-to-severe stenosis, but is visualized. The left anterior inferior cerebellar arteries are not clearly demonstrated. The posterior inferior cerebellar arteries are beneath the level of the study. IMPRESSION: 1. 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 2. Mild-to-moderate irregular stenosis of the M1 branches of the middle cerebral arteries bilaterally. 3. Near occlusive stenosis of  the sylvian M2 branch of the left middle cerebral artery and opercular M3 branch of the right middle cerebral artery. 4. Moderate stenosis of the distal basilar artery. 5. Diminutive or moderately stenotic bun segments of the posterior cerebral arteries. 6. Severe stenosis of the distal P1 segment of the right posterior cerebral artery and near occlusive stenosis of the distal right posterior communicating artery. 7. Patent but diminutive superior cerebellar arteries. 8. Moderate-to-severe stenosis of the right anterior inferior cerebellar artery. The left anterior inferior cerebellar artery is not clearly demonstrated. Electronically signed by: evalene coho 11/29/2023 03:23 PM EDT RP Workstation: [id number]   MR BRAIN WO CONTRAST Result Date: 11/29/2023 CLINICAL DATA:  eval acute cva. vasculopath with acute confusion, word salad, disorientation. nonfocal exa,m EXAM: MRI HEAD WITHOUT CONTRAST TECHNIQUE: Multiplanar, multiecho pulse sequences of the brain and surrounding structures were obtained without intravenous contrast. COMPARISON:  CT head from earlier today.  MRI head 01/18/2021. FINDINGS: Brain: Small acute infarct in the left insula and many small acute infarcts in the overlying left parietal lobe (posterior left MCA territory). No mass effect. No midline shift. No evidence of acute hemorrhage, mass lesion, or hydrocephalus. Remote right PCA territory infarcts in many remote bilateral cerebellar infarcts. Remote perforator infarct in the left basal ganglia. Remote right frontal infarcts. Remote left thalamic infarct. Vascular: Major arterial flow voids are maintained at the skull base. Skull and upper cervical spine: Abnormal flow related signal in the right vertebral artery, compatible with known occlusion seen on prior MRA. Sinuses/Orbits: Right maxillary sinus opacification. Other: No mastoid effusions. IMPRESSION: 1. Small acute infarct in the left insula and many small acute infarcts in the  overlying left parietal lobe (posterior left MCA territory). 2. Many remote infarcts, as detailed above. 3. Chronic right vertebral artery occlusion. Electronically Signed   By: Gilmore GORMAN Molt M.D.   On: 11/29/2023 00:16   DG Chest Portable 1 View Result Date: 11/28/2023 CLINICAL DATA:  Encephalopathy EXAM: PORTABLE CHEST 1 VIEW COMPARISON:  Chest x-ray 09/07/2023 FINDINGS: Sternotomy wires are again seen. Cardiomediastinal silhouette is within normal limits. Large hiatal hernia again noted. No focal lung infiltrate, pleural effusion or pneumothorax. Small nodular density projects of the left costophrenic angle. No acute fractures are seen. IMPRESSION: 1. No acute cardiopulmonary process. 2. Large hiatal hernia. 3. Small nodular density projects of the left costophrenic angle. Recommend further evaluation with nonemergent chest CT. Electronically Signed   By: Greig Pique M.D.   On: 11/28/2023 22:24   CT HEAD WO CONTRAST Result Date: 11/28/2023 CLINICAL DATA:  Neuro  deficit, acute, stroke suspected.  Confusion. EXAM: CT HEAD WITHOUT CONTRAST TECHNIQUE: Contiguous axial images were obtained from the base of the skull through the vertex without intravenous contrast. RADIATION DOSE REDUCTION: This exam was performed according to the departmental dose-optimization program which includes automated exposure control, adjustment of the mA and/or kV according to patient size and/or use of iterative reconstruction technique. 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 Old lacunar infarcts in the periventricular white matter bilaterally. There is atrophy and chronic small vessel disease changes. No acute intracranial abnormality. Specifically, no hemorrhage, hydrocephalus, mass lesion, acute infarction, or significant intracranial injury. Vascular: No hyperdense vessel or unexpected calcification. Skull: No acute calvarial abnormality. 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 No air-fluid levels throughout the remainder of the paranasal sinuses. Mastoid air cells clear. Other: None IMPRESSION: Numerous old infarcts as described above. Atrophy, chronic microvascular disease. No acute intracranial abnormality. Electronically Signed   By: Franky Crease M.D.   On: 11/28/2023 19:19    Microbiology: Results for orders placed or performed during the hospital encounter of 11/28/23  Respiratory (~20 pathogens) panel by PCR     Status: None   Collection Time: 12/07/23 11:35 AM   Specimen: Nasopharyngeal Swab; Respiratory  Result Value Ref Range Status   Adenovirus NOT DETECTED NOT DETECTED Final   Coronavirus 229E NOT DETECTED NOT DETECTED Final    Comment: (NOTE) The Coronavirus on the Respiratory Panel, DOES NOT test for the novel  Coronavirus (2019 nCoV)    Coronavirus HKU1 NOT DETECTED NOT DETECTED Final   Coronavirus NL63 NOT DETECTED NOT DETECTED Final   Coronavirus OC43 NOT DETECTED NOT DETECTED Final   Metapneumovirus NOT DETECTED NOT DETECTED Final   Rhinovirus / Enterovirus NOT DETECTED NOT DETECTED Final   Influenza A NOT DETECTED NOT DETECTED Final   Influenza B NOT DETECTED NOT DETECTED Final   Parainfluenza Virus 1 NOT DETECTED NOT DETECTED Final   Parainfluenza Virus 2 NOT DETECTED NOT DETECTED Final   Parainfluenza Virus 3 NOT DETECTED NOT DETECTED Final   Parainfluenza Virus 4 NOT DETECTED NOT DETECTED Final   Respiratory Syncytial Virus NOT DETECTED NOT DETECTED Final   Bordetella pertussis NOT DETECTED NOT DETECTED Final   Bordetella Parapertussis NOT DETECTED NOT DETECTED Final   Chlamydophila pneumoniae NOT DETECTED NOT DETECTED Final   Mycoplasma pneumoniae NOT DETECTED NOT DETECTED Final    Comment: Performed at [facility], [address], [place], [place] [address]    Labs: CBC: Recent Labs  Lab 12/08/23 [phone] 12/09/23 0438 12/10/23 0539 12/12/23 0450  12/13/23 0600  WBC 11.2* 10.1 7.7 7.9 7.6  NEUTROABS  --   --   --  5.5 5.1  HGB 13.5 13.3 13.8 13.7 12.9*  HCT 40.9 40.6 43.0 40.9 38.6*  MCV 92.7 93.3 93.9 90.3 90.2  PLT 264 295 320 288 293   Basic Metabolic Panel: Recent Labs  Lab 12/08/23 0523 12/08/23 1652 12/09/23 0438 12/09/23 1616 12/10/23 0539 12/10/23 1659 12/11/23 0619 12/12/23 0450 12/13/23 0600  NA 148*   < > 148*   < > 148* 145 143 143 146*  K 3.4*   < > 3.4*   < > 3.5 3.2* 3.5 3.2* 3.9  CL 110   < > 116*   < > 113* 114* 112* 110 115*  CO2 19*   < > 18*   < > 19* 19* 22 22 20*  GLUCOSE 156*   < >  168*   < > 215* 166* 250* 220* 196*  BUN 41*   < > 44*   < > 40* 37* 33* 27* 24*  CREATININE 3.29*   < > 3.39*   < > 3.16* 2.99* 3.06* 2.83* 2.86*  CALCIUM  6.6*   < > 6.5*   < > 6.5* 6.4* 6.4* 6.9* 7.2*  MG 2.2  --  2.0  --  2.1  --  1.9  --   --   PHOS 2.2*  --  2.4*  --  2.6  --   --   --   --    < > = values in this interval not displayed.   Liver Function Tests: Recent Labs  Lab 12/10/23 0539  ALBUMIN  2.4*   CBG: Recent Labs  Lab 12/13/23 0831 12/13/23 1230 12/13/23 1634 12/13/23 2025 12/14/23 0804  GLUCAP 148* 230* 220* 135* 163*    Discharge time spent: greater than 30 minutes.  Signed: Delon Herald, MD [facility] 12/14/2023

## 2024-04-14 NOTE — Progress Notes (Signed)
 83 y.o. male seen in follow-up for type 1 diabetes. Patient and his wife were seen today by telehealth. Patient is drowsy and did not participate in this conversation. Wife [name] provides 100% of the history. He is currently residing at [facility].   Diabetes mellitus, type 1 -- His HbA1c is 8.6%. He has a DexCom G7 CGM. I did not have access to the blood glucose readings on the CGM. However I did review his blood glucose log. His sugars have been in the range of 92 - 259 fasting, 75 - 400 before lunch, and 82 - 480 mg/dl before supper. His wife reports his meal time intake is widely variable. There have been days where he has eating next to nothing and other days where he finishes his whole tray. His insulin  regimen is: Lantus  insulin  10 units in the morning 5 units qhs AND NovoLog  tid AC: 151-200 take 4 units, 201-250 take 6 units, 251-300 take 8 units 301-350 take 10 units, 351-400 take 12 units, if sugar higher than 400 take 15 units.  Diabetes complications -- He has vascular disease and stage 4 CKD. His eGFR is now 11. He was last seen by Dr Dennise, [facility], in 08/2023. He is no longer going to have annual dilated eye exams.  Hypertension -- He is taking hydralazine , amlodipine  and an ARB.    Osteoporosis -- He is s/p left tibia tubercle fracture on 01/15/2023. He is s/p left distal radial/ulnar fracture status post FOOSH in 06/2023. He was placed in a cast initially and is now without a brace. DEXA scan on 02/14/2023 notable for T-score of -2.6 at the left femoral neck, consistent with osteoporosis. Prolia 60 mg was added in 03/2023. Last dose given on 04/13/2024.    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 Now requires full time care.   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   Chronic kidney disease    stage III CKD   Colon polyps    last colonoscopy 2011   CVA (cerebral vascular accident) (CMS/HHS-HCC) 01/18/2021   Diabetes mellitus type 1 (CMS/HHS-HCC)    Hiatal hernia    on CT scan 11/2018   History of stroke    HTN (hypertension) 09/10/2013   Hyperlipidemia    Parkinson's disease (CMS/HHS-HCC)    Prostate cancer (CMS/HHS-HCC) 09/10/2013   [id number], postop    Outpatient Medications Marked as Taking for the 04/14/24 encounter (Telemedicine) with Solum, Anna Melissa, MD  Medication Sig Dispense Refill   acetaminophen  (TYLENOL ) 325 MG tablet Take 650 mg by mouth every 4 (four) hours as needed for Pain     amLODIPine  (NORVASC ) 10 MG tablet Take 1 tablet (10 mg total) by mouth at bedtime 90 tablet 3   atorvastatin  (LIPITOR ) 40 MG tablet Take 40 mg by mouth once daily     bisacodyL  (DULCOLAX) 5 mg EC tablet Take 5 mg by mouth     calcitRIOL  (ROCALTROL ) 0.5 MCG capsule Take 0.5 mcg by mouth once daily     carbidopa -levodopa  (SINEMET ) 25-100 mg tablet Take 1 tablet by mouth 3 (three) times daily for 360 days 270 tablet 3   carboxymethylcellulose (REFRESH TEARS) 0.5 % ophthalmic solution Place 1-2 drops into both eyes as needed for Dry Eyes  cholecalciferol  (VITAMIN D3) 1000 unit tablet Take 1,000 Units by mouth once daily.     clopidogreL  (PLAVIX ) 75 mg tablet Take 75 mg by mouth once daily     divalproex (DEPAKOTE ER) 250 MG ER tablet Take 250 mg by mouth 2 (two) times daily     donepeziL  (ARICEPT ) 5 MG tablet Take 1 tablet (5 mg total) by mouth at bedtime 90 tablet 3   hydrALAZINE  (APRESOLINE ) 50 MG tablet Take 50 mg by mouth     insulin  ASPART (NOVOLOG  FLEXPEN) pen injector (concentration 100 units/mL) Sliding scale 151-200 take 4 units, 201-250 take 6, 251-300 take 8, 301-350 take 10, 351-400 take 12, higher than 400 take 15 and call md. Administer 15 minutes BEFORE  meals TID 15 mL 12   insulin  GLARGINE (LANTUS  SOLOSTAR) pen injector (concentration 100 units/mL) Inject 9 units in the morning and 5 units at night. (Patient taking differently: Inject 10 units in the morning 5 units qhs) 15 mL 11   levothyroxine  112 mcg Cap Take 112 mcg by mouth every morning before breakfast (0630) ON AN EMPTY STOMACH WITH A GLASS OF WATER  AT LEAST 30 TO 60 MINUTES BEFORE BREAKFAST     LORazepam  (ATIVAN ) 0.5 MG tablet Take 0.5 mg by mouth every 6 (six) hours as needed for Anxiety     losartan  (COZAAR ) 100 MG tablet Take 100 mg by mouth once daily     melatonin 3 mg tablet 2 tablets at bedtime     memantine (NAMENDA) 5 MG tablet Take 5 mg by mouth 2 (two) times daily     mirtazapine (REMERON) 7.5 MG tablet Take 7.5 mg by mouth at bedtime     multivitamin capsule Take 1 capsule by mouth once daily     omeprazole (PRILOSEC) 20 MG DR capsule Take 1 capsule (20 mg total) by mouth once daily 90 capsule 3   sodium bicarbonate  650 MG tablet Take 1,300 mg by mouth 3 (three) times daily     triamcinolone 0.5 % cream APPLY TOPICALLY TWICE DAILY 30 g 11   zinc  oxide-cod liver oil (DESITIN) 40 % paste Apply topically       Family history Son also has type 1 diabetes since age 41. Darden has Celiac sprue. Father passed away from prostate cancer. Mother had malignant melanoma and heart disease, she passed away in her 39s.  Exam Ht 175.3 cm (5' 9)   Wt 54.7 kg (120 lb 8 oz)   BMI 17.79 kg/m  GEN: well developed male in NAD. Resting in bed. Unable to assess further.     Labs Office Visit on 04/13/2024  Component Date Value Ref Range Status   Hemoglobin A1C 04/13/2024 8.6 (H)  4.2 - 5.6 % Final   Average Blood Glucose (Calc) 04/13/2024 200  mg/dL Final   Glucose 87/84/7974 131 (H)  70 - 110 mg/dL Final   Sodium 87/84/7974 145  136 - 145 mmol/L Final   Potassium 04/13/2024 3.9  3.6 - 5.1 mmol/L Final   Chloride 04/13/2024 106  97 - 109 mmol/L Final   Carbon  Dioxide (CO2) 04/13/2024 28.7  22.0 - 32.0 mmol/L Final   Calcium  04/13/2024 9.3  8.7 - 10.3 mg/dL Final   Urea Nitrogen (BUN) 04/13/2024 54 (H)  7 - 25 mg/dL Final   Creatinine 87/84/7974 4.9 (H)  0.7 - 1.3 mg/dL Final   Glomerular Filtration Rate (eGFR) 04/13/2024 11 (L)  >60 mL/min/1.73sq m Final   CKD-EPI (2021) does not include patient's race in  the calculation of eGFR.  Monitoring changes of plasma creatinine and eGFR over time is useful for monitoring kidney function.   Interpretive Ranges for eGFR (CKD-EPI 2021):  eGFR:       >60 mL/min/1.73 sq. m - Normal eGFR:       30-59 mL/min/1.73 sq. m - Moderately Decreased eGFR:       15-29 mL/min/1.73 sq. m  - Severely Decreased eGFR:       < 15 mL/min/1.73 sq. m  - Kidney Failure    Note: These eGFR calculations do not apply in acute situations when eGFR is changing rapidly or patients on dialysis.   BUN/Crea Ratio 04/13/2024 11.0  6.0 - 20.0 Final   Anion Gap w/K 04/13/2024 14.2  6.0 - 16.0 Final    Assessment 1. 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 Type 1 diabetes mellitus with proteinuria (CMS/HHS-HCC)   4. Essential (primary) hypertension   5. Age related osteoporosis, unspecified pathological fracture presence     Plan   - Diabetes remains uncontrolled however now that his meal time intake is unpredictable, he has significant blood glucose variability. At this stage, the goal is to avoid hypoglycemia and avoid severe hyperglycemia. At this time, we are meeting these goals therefore I did not adjust his insulin  regimen today. - Continue monitoring of blood glucose readings TID with glucometer and around the clock with a CGM.  - Continue ARB. Continue statin. - Continue follow up with nephrology as planned. - Annual eye exams are no longer appropriate.  - He should continue Prolia Q6 months. Next dose due in 09/2024.   - Continue vit D supplement.  - Anticipate follow up in 6 weeks or sooner if needed.   ---------------------------  This video encounter was conducted with the patient's (or proxy's) verbal consent via secure, interactive audio and video telecommunications while in clinic/office/hospital.  The patient (or proxy) was instructed to have this encounter in a suitably private space and to only have persons present to whom they give permission to participate. In addition, patient identity was confirmed by use of name plus an additional identifier.  This visit was coded based on medical decision making (MDM).

## 2024-04-18 ENCOUNTER — Emergency Department

## 2024-04-18 ENCOUNTER — Emergency Department: Admission: EM | Admit: 2024-04-18 | Discharge: 2024-04-19 | Disposition: A | Discharge status: Home / Self Care

## 2024-04-18 ENCOUNTER — Other Ambulatory Visit: Payer: Self-pay

## 2024-04-18 DIAGNOSIS — E1049 Type 1 diabetes mellitus with other diabetic neurological complication: Secondary | ICD-10-CM | POA: Insufficient documentation

## 2024-04-18 DIAGNOSIS — N184 Chronic kidney disease, stage 4 (severe): Secondary | ICD-10-CM | POA: Insufficient documentation

## 2024-04-18 DIAGNOSIS — I2510 Atherosclerotic heart disease of native coronary artery without angina pectoris: Secondary | ICD-10-CM | POA: Diagnosis not present

## 2024-04-18 DIAGNOSIS — S0081XA Abrasion of other part of head, initial encounter: Secondary | ICD-10-CM | POA: Insufficient documentation

## 2024-04-18 DIAGNOSIS — E1022 Type 1 diabetes mellitus with diabetic chronic kidney disease: Secondary | ICD-10-CM | POA: Diagnosis not present

## 2024-04-18 DIAGNOSIS — N390 Urinary tract infection, site not specified: Secondary | ICD-10-CM | POA: Diagnosis not present

## 2024-04-18 DIAGNOSIS — I129 Hypertensive chronic kidney disease with stage 1 through stage 4 chronic kidney disease, or unspecified chronic kidney disease: Secondary | ICD-10-CM | POA: Insufficient documentation

## 2024-04-18 DIAGNOSIS — Z8673 Personal history of transient ischemic attack (TIA), and cerebral infarction without residual deficits: Secondary | ICD-10-CM | POA: Diagnosis not present

## 2024-04-18 DIAGNOSIS — W1830XA Fall on same level, unspecified, initial encounter: Secondary | ICD-10-CM | POA: Insufficient documentation

## 2024-04-18 DIAGNOSIS — F0280 Dementia in other diseases classified elsewhere without behavioral disturbance: Secondary | ICD-10-CM | POA: Diagnosis not present

## 2024-04-18 DIAGNOSIS — E039 Hypothyroidism, unspecified: Secondary | ICD-10-CM | POA: Diagnosis not present

## 2024-04-18 DIAGNOSIS — G20A1 Parkinson's disease without dyskinesia, without mention of fluctuations: Secondary | ICD-10-CM | POA: Insufficient documentation

## 2024-04-18 DIAGNOSIS — Z8546 Personal history of malignant neoplasm of prostate: Secondary | ICD-10-CM | POA: Diagnosis not present

## 2024-04-18 DIAGNOSIS — Z951 Presence of aortocoronary bypass graft: Secondary | ICD-10-CM | POA: Diagnosis not present

## 2024-04-18 DIAGNOSIS — R296 Repeated falls: Secondary | ICD-10-CM

## 2024-04-18 LAB — URINALYSIS, COMPLETE (UACMP) WITH MICROSCOPIC
Bilirubin Urine: NEGATIVE
Glucose, UA: NEGATIVE mg/dL
Ketones, ur: NEGATIVE mg/dL
Nitrite: POSITIVE — AB
Protein, ur: 100 mg/dL — AB
Specific Gravity, Urine: 1.017 (ref 1.005–1.030)
Squamous Epithelial / HPF: 0 /HPF (ref 0–5)
WBC, UA: >50 WBC/hpf (ref 0–5)
pH: 6 (ref 5.0–8.0)

## 2024-04-18 LAB — CBG MONITORING, ED
Glucose-Capillary: 110 mg/dL — ABNORMAL HIGH (ref 70–99)
Glucose-Capillary: 67 mg/dL — ABNORMAL LOW (ref 70–99)

## 2024-04-18 MED ORDER — AMOXICILLIN-POT CLAVULANATE 875-125 MG PO TABS: 1.0000 | ORAL_TABLET | Freq: Two times a day (BID) | ORAL | 0 refills | Status: AC

## 2024-04-18 MED ORDER — AMOXICILLIN-POT CLAVULANATE 875-125 MG PO TABS: 1.0000 | ORAL_TABLET | Freq: Two times a day (BID) | ORAL | 0 refills | Status: DC

## 2024-04-18 MED ORDER — AMOXICILLIN-POT CLAVULANATE 875-125 MG PO TABS
1.0000 | ORAL_TABLET | Freq: Once | ORAL | Status: AC
  Administered 2024-04-18: 1 via ORAL
  Filled 2024-04-18: qty 1

## 2024-04-18 NOTE — Progress Notes (Signed)
" °  Chaplain On-Call visited with the patient's wife [name] and etheleen Ship upon request from Dr. Rolland Moats.  Chaplain met them in room ED-2, and provided prayer and spiritual and emotional support.  Their hope is that the patient can return to his current residence at a facility in [place].  Chaplain Bebe Ardean EMERSON Hershal., BCC "

## 2024-04-18 NOTE — ED Provider Notes (Signed)
 "  [facility] Provider Note    Event Date/Time   First MD Initiated Contact with Patient 04/18/24 2019     (approximate)   History   Fall   HPI  Victor Gould is a 83 y.o. male type I diabetic (no insulin  pump), CAD s/p CABG, HTN, afib, stage 4 CKD, DM, hypothyroidism, prostate CA, HLD, CVA/SDH, and Parkinson's disease who presents from his skilled nursing facility with fall and possible loss of consciousness.  Per EMS patient has fallen multiple times over the past 8 days.  He does reside at a skilled nursing facility and does have a DNR/DNI.  There have been no reports of fevers or increased work of breathing.  Glucose was 94 en route by EMS  Further history is obtained by patient's wife and son who arrived at bedside afterwards.  Patient is at his mental baseline but he has been more anxious than usual which the wife attributes to her own illness and her inability to visit the skilled nursing facility for the past 2 weeks regularly as she normally does.  She tells me that the patient would not want blood work and would not want admission as this could increase his anxiety.  She is amenable to CT imaging and x-rays along with a urinalysis.  Son is in agreement with this plan.      Physical Exam   Triage Vital Signs: ED Triage Vitals  Encounter Vitals Group     BP 04/18/24 2015 (!) 146/71     Girls Systolic BP Percentile --      Girls Diastolic BP Percentile --      Boys Systolic BP Percentile --      Boys Diastolic BP Percentile --      Pulse Rate 04/18/24 2015 (!) 41     Resp 04/18/24 2017 18     Temp 04/18/24 2017 98.3 F (36.8 C)     Temp Source 04/18/24 2017 Oral     SpO2 04/18/24 2024 100 %     Weight --      Height --      Head Circumference --      Peak Flow --      Pain Score --      Pain Loc --      Pain Education --      Exclude from Growth Chart --     Most recent vital signs: Vitals:   04/18/24 2017 04/18/24 2024  BP:     Pulse:  95  Resp: 18   Temp: 98.3 F (36.8 C)   SpO2:  100%    Nursing Triage Note reviewed. Vital signs reviewed and patients oxygen saturation is normoxic  General: Patient is well nourished, well developed, awake and alert, resting comfortably in no acute distress Head: Normocephalic, there are small abrasions on his forehead which were cleansed and repaired with Steri-Strip Eyes: Normal inspection, extraocular muscles intact, no conjunctival pallor Ear, nose, throat: Normal external exam Neck: Normal range of motion Respiratory: Patient is in no respiratory distress, lungs CTAB Cardiovascular: Patient is tachycardic, RR without murmur appreciated GI: Abd SNT with no guarding or rebound  Back: Normal inspection of the back with good strength and range of motion throughout all ext Extremities: pulses intact with good cap refills, no LE pitting edema or calf tenderness Neuro: The patient is alert and oriented to person, not to place or time, does move all extremities with left side slightly less than right. Psych:  normal mood and affect, no SI or HI  ED Results / Procedures / Treatments   Labs (all labs ordered are listed, but only abnormal results are displayed) Labs Reviewed  URINALYSIS, COMPLETE (UACMP) WITH MICROSCOPIC - Abnormal; Notable for the following components:      Result Value   Color, Urine YELLOW (*)    APPearance TURBID (*)    Hgb urine dipstick SMALL (*)    Protein, ur 100 (*)    Nitrite POSITIVE (*)    Leukocytes,Ua LARGE (*)    Bacteria, UA MANY (*)    All other components within normal limits  CBG MONITORING, ED - Abnormal; Notable for the following components:   Glucose-Capillary 67 (*)    All other components within normal limits  CBG MONITORING, ED - Abnormal; Notable for the following components:   Glucose-Capillary 110 (*)    All other components within normal limits  URINE CULTURE  CBG MONITORING, ED  CBG MONITORING, ED  CBG MONITORING, ED   CBG MONITORING, ED  CBG MONITORING, ED  CBG MONITORING, ED  CBG MONITORING, ED  CBG MONITORING, ED  CBG MONITORING, ED  CBG MONITORING, ED  CBG MONITORING, ED     EKG None  RADIOLOGY CT head: No acute abnormality on my independent review interpretation radiologist agrees CT C-spine: No acute fracture on my independent review interpretation radiologist agrees X-ray of right knee: No acute abnormality on my independent review interpretation radiologist agrees X-ray left knee: No acute abnormality on my independent review interpretation radiologist agrees Bilateral hips: No acute abnormality on my independent review interpretation radiologist agrees    PROCEDURES:  Critical Care performed: No  Procedures   MEDICATIONS ORDERED IN ED: Medications  amoxicillin -clavulanate (AUGMENTIN ) 875-125 MG per tablet 1 tablet (1 tablet Oral Given 04/18/24 2324)     IMPRESSION / MDM / ASSESSMENT AND PLAN / ED COURSE                                Differential diagnosis includes, but is not limited to, intracranial hemorrhage, cervical spine fracture, hip fracture, tibia-fibula fracture, UTI,  ED course: Patient presents and does have evidence of recent trauma.  Given that he is on a blood thinner CT head and C-spine were obtained which demonstrated no intracranial hemorrhage or other abnormality.  I did discuss the indications benefits risks and alternatives of blood work today with patient's wife and son who voiced understanding and declined blood work as this would not be within the patient's goals of care as patient would not want to be admitted under any circumstances.  Urinalysis was consistent with UTI and after discussion with pharmacy, decision made to initiate the patient on her Magnum twice daily 7 days for complicated UTI.  While here patient's glucose did drop but he was able to take orange juice and repeat glucose rebounded.  Wife feels comfortable with patient returning home  to his skilled nursing facility.  I sent a prescription for Augmentin  and also printed out a prescription for him to take with him to discharge on skilled nursing facility.   Clinical Course as of 04/19/24 0000  Sat Apr 18, 2024  2233 DG Hips Bilat W or Wo Pelvis 2 Views No fracture [HD]  2233 DG Knee 2 Views Left No fracture [HD]  2234 DG Knee 2 Views Right No fracture [HD]  2234 CT Cervical Spine Wo Contrast No fracture [HD]  2234 CT Head  Wo Contrast No intracranial hemorrhage [HD]  2305 Glucose-Capillary(!): 67 Patient getting orange juice now [HD]  2305 Urinalysis, Complete w Microscopic -Urine, Clean Catch(!) Consistent with UTI [HD]  2308 Talking with pharmacy regarding antibiogram and appropriate antibiotic [HD]  Sun Apr 19, 2024  0000 Glucose-Capillary(!): 110 Much improved [HD]    Clinical Course User Index [HD] Nicholaus Rolland BRAVO, MD   At time of discharge there is no evidence of acute life, limb, vision, or fertility threat. Patient has stable vital signs, pain is well controlled,  and p.o. tolerant.  Discharge instructions were completed using the EPIC system. I would refer you to those at this time. All warnings prescriptions follow-up etc. were discussed in detail with the patient. Patient indicates understanding and is agreeable with this plan. All questions answered.  Patient is made aware that they may return to the emergency department for any worsening or new condition or for any other emergency.   -- Risk: 5 This patient has a high risk of morbidity due to further diagnostic testing or treatment. Rationale: This patients evaluation and management involve a high risk of morbidity due to the potential severity of presenting symptoms, need for diagnostic testing, and/or initiation of treatment that may require close monitoring. The differential includes conditions with potential for significant deterioration or requiring escalation of care. Treatment decisions in  the ED, including medication administration, procedural interventions, or disposition planning, reflect this level of risk. COPA: 5 The patient has the following acute or chronic illness/injury that poses a possible threat to life or bodily function: [X] : The patient has a potentially serious acute condition or an acute exacerbation of a chronic illness requiring urgent evaluation and management in the Emergency Department. The clinical presentation necessitates immediate consideration of life-threatening or function-threatening diagnoses, even if they are ultimately ruled out.   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)  Dementia due to Parkinson's disease, unspecified dementia severity, unspecified whether behavioral, psychotic, or mood disturbance or anxiety (HCC)  Urinary tract infection without hematuria, site unspecified     Rx / DC Orders   ED Discharge Orders          Ordered    amoxicillin -clavulanate (AUGMENTIN ) 875-125 MG tablet  2 times daily,   Status:  Discontinued        04/18/24 2317    amoxicillin -clavulanate (AUGMENTIN ) 875-125 MG tablet  2 times daily        04/18/24 2317             Note:  This document was prepared using Dragon voice recognition software and may include unintentional dictation errors.   Nicholaus Rolland BRAVO, MD 04/19/24 0000  "

## 2024-04-18 NOTE — ED Triage Notes (Signed)
 BIB by EMS from facility unwitness fall with unknown LOC (happened at 1900), mult falls during his time at facility. Found on found by bed. Dementia, does not speak. Swelling/bumps to head with right head laceration. On eliquis , Complaining of left leg pain. Abrasion on back.  EMS 134/ 66 HR 83 96 O2 on Room air CBG 94

## 2024-04-18 NOTE — Discharge Instructions (Signed)
 You was seen in our emergency department for fall.  Workup demonstrated no acute traumatic injury.  Your abrasions were repaired with Steri-Strips.  Urinalysis was consistent with urinary tract infection.  You and your family declined any blood work or desire for admission.  Your urinalysis was consistent with UTI and after discussion with pharmacy you need to be on at least 1 week of Augmentin .  Please return with any acutely worsening symptoms or any other emergency. -- RETURN PRECAUTIONS & AFTERCARE: (ENGLISH) RETURN PRECAUTIONS: Return immediately to the emergency department or see/call your doctor if you feel worse, weak or have changes in speech or vision, are short of breath, have fever, vomiting, pain, bleeding or dark stool, trouble urinating or any new issues. Return here or see/call your doctor if not improving as expected for your suspected condition. FOLLOW-UP CARE: Call your doctor and/or any doctors we referred you to for more advice and to make an appointment. Do this today, tomorrow or after the weekend. Some doctors only take PPO insurance so if you have HMO insurance you may want to contact your HMO or your regular doctor for referral to a specialist within your plan. Either way tell the doctor's office that it was a referral from the emergency department so you get the soonest possible appointment.  YOUR TEST RESULTS: Take result reports of any blood or urine tests, imaging tests and EKG's to your doctor and any referral doctor. Have any abnormal tests repeated. Your doctor or a referral doctor can let you know when this should be done. Also make sure your doctor contacts this hospital to get any test results that are not currently available such as cultures or special tests for infection and final imaging reports, which are often not available at the time you leave the ER but which may list additional important findings that are not documented on the preliminary report. BLOOD PRESSURE: If  your blood pressure was greater than 120/80 have your blood pressure rechecked within 1 to 2 weeks. MEDICATION SIDE EFFECTS: Do not drive, walk, bike, take the bus, etc. if you have received or are being prescribed any sedating medications such as those for pain or anxiety or certain antihistamines like Benadryl . If you have been give one of these here get a taxi home or have a friend drive you home. Ask your pharmacist to counsel you on potential side effects of any new medication

## 2024-04-18 NOTE — ED Provider Notes (Incomplete)
 "  [facility] Provider Note    Event Date/Time   First MD Initiated Contact with Patient 04/18/24 2019     (approximate)   History   Fall   HPI  Victor Gould is a 83 y.o. male  ***       Physical Exam   Triage Vital Signs: ED Triage Vitals [04/18/24 2017]  Encounter Vitals Group     BP      Girls Systolic BP Percentile      Girls Diastolic BP Percentile      Boys Systolic BP Percentile      Boys Diastolic BP Percentile      Pulse      Resp 18     Temp 98.3 F (36.8 C)     Temp Source Oral     SpO2      Weight      Height      Head Circumference      Peak Flow      Pain Score      Pain Loc      Pain Education      Exclude from Growth Chart     Most recent vital signs: Vitals:   04/18/24 2017  Resp: 18  Temp: 98.3 F (36.8 C)    Nursing Triage Note reviewed. Vital signs reviewed and patients oxygen saturation is normoxic***  General: Patient is well nourished, well developed, awake and alert, resting comfortably in no acute distress Head: Normocephalic and atraumatic Eyes: Normal inspection, extraocular muscles intact, no conjunctival pallor Ear, nose, throat: Normal external exam Neck: Normal range of motion Respiratory: Patient is in no respiratory distress, lungs CTAB Cardiovascular: Patient is not tachycardic, RRR without murmur appreciated GI: Abd SNT with no guarding or rebound  Back: Normal inspection of the back with good strength and range of motion throughout all ext Extremities: pulses intact with good cap refills, no LE pitting edema or calf tenderness Neuro: The patient is alert and oriented to person, place, and time, appropriately conversive, with 5/5 bilat UE/LE strength, no gross motor or sensory defects noted. Coordination appears to be adequate. Skin: Warm, dry, and intact Psych: normal mood and affect, no SI or HI  ED Results / Procedures / Treatments   Labs (all labs ordered are listed, but only  abnormal results are displayed) Labs Reviewed - No data to display   EKG   RADIOLOGY ***    PROCEDURES:  Critical Care performed: {CriticalCareYesNo:19197::Yes, see critical care procedure note(s),No}  Procedures   MEDICATIONS ORDERED IN ED: Medications - No data to display   IMPRESSION / MDM / ASSESSMENT AND PLAN / ED COURSE                                Differential diagnosis includes, but is not limited to, ***    ***     -- Risk: 5 This patient has a high risk of morbidity due to further diagnostic testing or treatment. Rationale: This patients evaluation and management involve a high risk of morbidity due to the potential severity of presenting symptoms, need for diagnostic testing, and/or initiation of treatment that may require close monitoring. The differential includes conditions with potential for significant deterioration or requiring escalation of care. Treatment decisions in the ED, including medication administration, procedural interventions, or disposition planning, reflect this level of risk. COPA: 5 The patient has the following acute or chronic illness/injury  that poses a possible threat to life or bodily function: [X] : The patient has a potentially serious acute condition or an acute exacerbation of a chronic illness requiring urgent evaluation and management in the Emergency Department. The clinical presentation necessitates immediate consideration of life-threatening or function-threatening diagnoses, even if they are ultimately ruled out.   FINAL CLINICAL IMPRESSION(S) / ED DIAGNOSES   Final diagnoses:  None     Rx / DC Orders   ED Discharge Orders     None        Note:  This document was prepared using Dragon voice recognition software and may include unintentional dictation errors. "

## 2024-04-19 DIAGNOSIS — S0081XA Abrasion of other part of head, initial encounter: Secondary | ICD-10-CM | POA: Diagnosis not present

## 2024-04-19 MED ORDER — LORAZEPAM 0.5 MG PO TABS
0.5000 mg | ORAL_TABLET | Freq: Once | ORAL | Status: AC
  Administered 2024-04-19: 0.5 mg via ORAL
  Filled 2024-04-19: qty 1

## 2024-04-19 NOTE — ED Notes (Signed)
 [place] removed for transport

## 2024-04-19 NOTE — ED Notes (Signed)
 Pt continues to try to get out of the bed. Offered and gave the pt 2 warm blankets and that seems to calm him down bit. Falls bundle in place. Will continue to monitor for safety.

## 2024-04-19 NOTE — ED Notes (Signed)
 Pt is still attempting to climb out of bed. Fall bundle still in place. Pt redirected again.

## 2024-04-19 NOTE — ED Notes (Signed)
 Called report to patient facility and spoke with [facility] RN.

## 2024-04-19 NOTE — ED Notes (Signed)
 Patient has taken off all cardiac monitoring and unable to reorient due to dementia.

## 2024-04-21 LAB — URINE CULTURE: Culture: >=100000 — AB

## 2024-05-31 DEATH — deceased
